# Patient Record
Sex: Female | Born: 1955 | Race: Black or African American | Hispanic: No | Smoking: Never smoker
Health system: Southern US, Community
[De-identification: ages and names within clinical notes are randomized; demographics above are authoritative.]

## PROBLEM LIST (undated history)

## (undated) DIAGNOSIS — Z9289 Personal history of other medical treatment: Secondary | ICD-10-CM

## (undated) DIAGNOSIS — H2010 Chronic iridocyclitis, unspecified eye: Secondary | ICD-10-CM

## (undated) DIAGNOSIS — Z8669 Personal history of other diseases of the nervous system and sense organs: Secondary | ICD-10-CM

## (undated) DIAGNOSIS — I2699 Other pulmonary embolism without acute cor pulmonale: Secondary | ICD-10-CM

## (undated) DIAGNOSIS — Z8489 Family history of other specified conditions: Secondary | ICD-10-CM

## (undated) DIAGNOSIS — M5416 Radiculopathy, lumbar region: Secondary | ICD-10-CM

## (undated) DIAGNOSIS — E079 Disorder of thyroid, unspecified: Secondary | ICD-10-CM

## (undated) DIAGNOSIS — M545 Low back pain, unspecified: Secondary | ICD-10-CM

## (undated) DIAGNOSIS — R7611 Nonspecific reaction to tuberculin skin test without active tuberculosis: Secondary | ICD-10-CM

## (undated) DIAGNOSIS — M1990 Unspecified osteoarthritis, unspecified site: Secondary | ICD-10-CM

## (undated) DIAGNOSIS — J309 Allergic rhinitis, unspecified: Secondary | ICD-10-CM

## (undated) DIAGNOSIS — M48061 Spinal stenosis, lumbar region without neurogenic claudication: Secondary | ICD-10-CM

## (undated) DIAGNOSIS — G8929 Other chronic pain: Secondary | ICD-10-CM

## (undated) DIAGNOSIS — T4145XA Adverse effect of unspecified anesthetic, initial encounter: Secondary | ICD-10-CM

## (undated) DIAGNOSIS — E785 Hyperlipidemia, unspecified: Secondary | ICD-10-CM

## (undated) DIAGNOSIS — Z8619 Personal history of other infectious and parasitic diseases: Secondary | ICD-10-CM

## (undated) DIAGNOSIS — I10 Essential (primary) hypertension: Secondary | ICD-10-CM

## (undated) DIAGNOSIS — T8859XA Other complications of anesthesia, initial encounter: Secondary | ICD-10-CM

## (undated) DIAGNOSIS — I82409 Acute embolism and thrombosis of unspecified deep veins of unspecified lower extremity: Secondary | ICD-10-CM

## (undated) DIAGNOSIS — K219 Gastro-esophageal reflux disease without esophagitis: Secondary | ICD-10-CM

## (undated) DIAGNOSIS — Z13220 Encounter for screening for lipoid disorders: Secondary | ICD-10-CM

## (undated) DIAGNOSIS — Z13820 Encounter for screening for osteoporosis: Secondary | ICD-10-CM

## (undated) DIAGNOSIS — M25572 Pain in left ankle and joints of left foot: Secondary | ICD-10-CM

## (undated) DIAGNOSIS — M79672 Pain in left foot: Secondary | ICD-10-CM

## (undated) DIAGNOSIS — R309 Painful micturition, unspecified: Secondary | ICD-10-CM

## (undated) DIAGNOSIS — Z23 Encounter for immunization: Secondary | ICD-10-CM

## (undated) DIAGNOSIS — Z139 Encounter for screening, unspecified: Secondary | ICD-10-CM

## (undated) DIAGNOSIS — M8580 Other specified disorders of bone density and structure, unspecified site: Secondary | ICD-10-CM

## (undated) DIAGNOSIS — J988 Other specified respiratory disorders: Secondary | ICD-10-CM

## (undated) DIAGNOSIS — Z0000 Encounter for general adult medical examination without abnormal findings: Secondary | ICD-10-CM

## (undated) HISTORY — DX: Spinal stenosis, lumbar region without neurogenic claudication: M48.061

## (undated) HISTORY — DX: Nonspecific reaction to tuberculin skin test without active tuberculosis: R76.11

## (undated) HISTORY — DX: Low back pain: M54.5

## (undated) HISTORY — DX: Personal history of other medical treatment: Z92.89

## (undated) HISTORY — DX: Chronic iridocyclitis, unspecified eye: H20.10

## (undated) HISTORY — PX: LUMBAR LAMINECTOMY: SHX95

## (undated) HISTORY — PX: ABDOMINAL HYSTERECTOMY: SHX81

## (undated) HISTORY — DX: Personal history of other diseases of the nervous system and sense organs: Z86.69

## (undated) HISTORY — DX: Low back pain, unspecified: M54.50

## (undated) HISTORY — PX: BREAST BIOPSY: SHX20

## (undated) HISTORY — DX: Disorder of thyroid, unspecified: E07.9

## (undated) HISTORY — DX: Personal history of other infectious and parasitic diseases: Z86.19

## (undated) HISTORY — DX: Allergic rhinitis, unspecified: J30.9

## (undated) HISTORY — DX: Hyperlipidemia, unspecified: E78.5

## (undated) HISTORY — DX: Radiculopathy, lumbar region: M54.16

## (undated) HISTORY — DX: Other chronic pain: G89.29

## (undated) LAB — PFT13-PULMONARY FUNCTION TEST
DLCO%: 87
DLCO: 17.5 ml/mmHg sec
FEV1%: 99
FEV1/FVC EXP: 74
FEV1/FVC: 67 %
FEV1: 2.48 L
FVC%: 112
FVC: 3.73 L
RESPONSE TO BRONCHODILATOR: -4
RV%: 137
RV: 2.57
TLC%: 123
TLC: 6.3 L
VC%: 112
VC: 3.73

## (undated) LAB — HM COLONOSCOPY: HM Colonoscopy: NORMAL

---
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
## 1982-07-05 HISTORY — PX: BACK SURGERY: SHX140

## 1986-07-05 HISTORY — PX: MYOMECTOMY: SHX85

## 2005-04-27 ENCOUNTER — Ambulatory Visit: Payer: Self-pay

## 2005-06-18 ENCOUNTER — Ambulatory Visit: Payer: Self-pay | Admitting: Unknown Physician Specialty

## 2007-04-25 ENCOUNTER — Ambulatory Visit: Payer: Self-pay | Admitting: Family Medicine

## 2007-05-10 ENCOUNTER — Ambulatory Visit: Payer: Self-pay | Admitting: Family Medicine

## 2007-05-18 ENCOUNTER — Ambulatory Visit: Payer: Self-pay | Admitting: Gastroenterology

## 2007-07-06 DIAGNOSIS — R7611 Nonspecific reaction to tuberculin skin test without active tuberculosis: Secondary | ICD-10-CM

## 2007-07-06 HISTORY — DX: Nonspecific reaction to tuberculin skin test without active tuberculosis: R76.11

## 2007-08-15 ENCOUNTER — Inpatient Hospital Stay: Payer: Self-pay | Admitting: Specialist

## 2007-09-18 ENCOUNTER — Ambulatory Visit: Payer: Self-pay | Admitting: Family Medicine

## 2007-11-10 ENCOUNTER — Ambulatory Visit: Payer: Self-pay | Admitting: General Surgery

## 2008-10-26 LAB — COMPREHENSIVE METABOLIC PANEL
ALT: 43 U/L — ABNORMAL HIGH (ref 10–40)
AST: 32 U/L (ref 15–37)
Albumin: 4.2 g/dL (ref 3.4–5.0)
Alkaline Phosphatase: 51 U/L (ref 25–100)
BUN: 12 mg/dL (ref 7–18)
CO2: 29 meq/L (ref 21–32)
Calcium: 9.7 mg/dL (ref 8.3–10.6)
Chloride: 104 meq/L (ref 99–110)
Creatinine: 0.7 mg/dL (ref 0.6–1.1)
GFR Est, African/Amer: >60
GFR, Estimated: >60 (ref >60)
Glucose: 81 mg/dL (ref 70–99)
Potassium: 4.7 meq/L (ref 3.5–5.1)
Sodium: 139 meq/L (ref 136–145)
Total Bilirubin: 0.7 mg/dL (ref 0.0–1.0)
Total Protein: 6.8 g/dL (ref 6.4–8.2)

## 2008-10-26 LAB — CBC WITH AUTO DIFFERENTIAL
Basophils %: 0.7 % (ref 0.0–2.0)
Basophils Absolute: 0 10*3 (ref 0.0–0.2)
Eosinophils %: 1.8 % (ref 0.0–5.0)
Eosinophils Absolute: 0.1 10*3 (ref 0.0–0.6)
Granulocyte Absolute Count: 4.9 10*3 (ref 1.7–7.7)
Hematocrit: 35.3 % — ABNORMAL LOW (ref 36.0–48.0)
Hemoglobin: 12.4 g/dL (ref 12.0–16.0)
Lymphocytes %: 18.8 % — ABNORMAL LOW (ref 25.0–40.0)
Lymphocytes Absolute: 1.3 10*3 (ref 1.0–5.1)
MCH: 31.5 pg (ref 26–34)
MCHC: 35.1 g/dL (ref 31–36)
MCV: 89.6 fl (ref 80–100)
MPV: 9.2 fl (ref 5.0–10.5)
Monocytes %: 8.2 % — ABNORMAL HIGH (ref 0.0–8.0)
Monocytes Absolute: 0.6 10*3 (ref 0.0–0.95)
Platelets: 212 10*3 (ref 135–450)
RBC: 3.95 10*6 — ABNORMAL LOW (ref 4.0–5.2)
RDW: 13 % (ref 11.5–14.5)
Segs Relative: 70.5 % — ABNORMAL HIGH (ref 42.0–63.0)
WBC: 6.9 10*3 (ref 4.0–11.0)

## 2008-10-26 LAB — LIPID PANEL
Cholesterol, Total: 232 mg/dl — ABNORMAL HIGH (ref <200)
HDL: 60 mg/dL (ref 40–60)
LDL Calculated: 137 mg/dl — ABNORMAL HIGH (ref <100)
Triglycerides: 173 mg/dl — ABNORMAL HIGH (ref <150)
VLDL Cholesterol Calculated: 35 mg/dl

## 2008-10-26 LAB — TSH: TSH: 0.96 u[IU]/mL (ref 0.35–5.5)

## 2008-11-01 NOTE — Op Note (Unsigned)
PATIENT NAME                  PA #             MR #                  Danielle Lawrence, Danielle Lawrence                    [id number]       [id number]            SURGEON                               SURG DATE   DIS DATE           Fernande Boyden, MD              11/01/08                        DATE OF BIRTH    AGE            PATIENT TYPE      RM #               12-21-1955       52             SDA                                     PREOPERATIVE DIAGNOSES:  1.  Deviated nasal septum.  2.  Chronic ethmoid sinusitis.  3.  Chronic maxillary sinusitis.     POSTOPERATIVE DIAGNOSES:  1.  Deviated nasal septum.  2.  Chronic ethmoid sinusitis.  3.  Chronic maxillary sinusitis.     PROCEDURE PERFORMED:  1.  Nasal septoplasty.  2.  Bilateral endoscopic total ethmoidectomy.  3.  Bilateral endoscopic maxillary antrostomy.     ANESTHESIA:  General endotracheal.     INFECTION CLASSIFICATION:  II.     INDICATIONS FOR PROCEDURE:   The patient is a 53 year old female with a six  to eight week history of sinusitis.  She had been on azithromycin, Augmentin  as well as Levaquin.  She continues to complain of right facial pain and  pressure.  She has nasal obstruction too.  It seems to effect both sides; the  left greater than the right.  She has evidence of occluded osteomeatal units  bilaterally.  CT scan revealed a mucosal thickening of the left side.  She  seems to improve with antibiotics but then two days after stopping them she  has recurrent pain and discomfort.  She has upper tooth pain.  She feels like  she has lost sleep.  We discussed continuing medical therapy versus surgical  therapy.  She is interested in surgical therapy.  We discussed the risks and  benefits of performing a nasal septoplasty and bilateral endoscopic sinus  surgery.  After a full disclosure of the risks and benefits, a consent was  obtained.     DESCRIPTION OF FINDINGS:  Leftward deviated nasal septum 75% on the left  side.  Osteomeatal units were tight and narrow secondary  to enlarged middle  turbinates.  There was no evidence of fluid or infection or polyps.       DESCRIPTION OF PROCEDURE:  After the induction of general endotracheal  anesthesia the nasal septum was infiltrated with 2 ml of 1% lidocaine with  1:100,000 epinephrine.  Cottonoid pledgets soaked in Afrin were placed in  both nasal cavities.  The patient was prepped and draped in the usual  fashion.       A time out was performed.  A left hemitransfixion incision was made.  A left  mucoperichondrial flap was elevated.  One and a half cm back from the caudal  edge an incision was made through the cartilaginous septum.  A right  mucoperichondrial flap was elevated.  A Silva knife was used to remove the  deviated portion of cartilage.  Posterior bony deflections were removed using  a combination of Jensen-Middleton and a 4 mm osteotome.  This significantly  opened the patient's nasal airway and improved access for sinus surgery.  The  left hemitransfixion incision was closed with interrupted 4-0 chromic gut  sutures.  The mucoperichondrial flaps were reapproximated to midline using a  4-0 plain gut on a Keith needle.       The 0 degree endoscope was then used to observe the left lateral nasal wall.   The root of the middle turbinate and lateral nasal wall were infiltrated with  1 ml of 1% lidocaine with 1:100,000 epinephrine.  A cottonoid pledget was  placed lateral to the middle turbinate.  After five minutes this was removed  and the uncinate process was taken down using a Freer and completely taken  down using a Blakeley.  The anterior ethmoid below was entered using a  microdebrider and opened up superiorly and laterally.  The basal lamella was  traversed and posterior ethmoid cells were opened from an inferior to  superior direction.  Maxillary sinus ostium was identified and opened  anteriorly using a back biter and a microdebrider.  The middle turbinate was  roughened using a microdebrided along its medial aspect as  was the  corresponding spot on the septum.  A Merocel sponge was then placed lateral  to the middle turbinate to serve for hemostasis and a spacer.       The right side was treated in a similar fashion.  A 0 degree endoscope was  introduced and used to direct local injection in the lateral nasal wall and  root of the middle turbinate.  A cottonoid pledget soaked in Afrin and 4%  lidocaine was placed lateral to the middle turbinate.  After five minutes  this was removed and the uncinate process was taken down using a Freer and  completely taken down using the microdebrider.  The maxillary sinus ostium  was found right away and opened anteriorly and inferiorly.  The anterior  ethmoid bulla was opened superior and laterally using the microdebrider.  The  basal lamella was traversed and posterior ethmoid cells were opened from an  inferior to superior direction and the middle turbinate was roughened in a  manner similar as above and a large Merocel sponge was placed lateral to the  middle turbinate again to serve for hemostasis and as a spacer.  The patient  tolerated the procedure well and was turned over to Anesthesia for  postoperative wakeup and recovery.     PATHOLOGY:  Right and left nasal contents.      ESTIMATED BLOOD LOSS:  Less than 50 ml.  Fernande Boyden, MD     [id number]  DD: 11/01/2008 09:54  DT: 11/01/2008 12:18  Job #: [id number]  CC:     [name], MD

## 2009-04-08 NOTE — Unmapped (Signed)
Signed by Harvie Junior MD on 04/08/2009 at 00:00:00  [facility]      Imported By: Betsey Amen 04/20/2009 12:52:06    _____________________________________________________________________    External Attachment:    Please see [employer] EMR for this document.

## 2009-04-12 NOTE — Unmapped (Signed)
Signed by Harvie Junior MD on 04/12/2009 at 00:00:00  ENT Medical History      Imported By: Betsey Amen 04/20/2009 12:10:00    _____________________________________________________________________    External Attachment:    Please see [facility] EMR for this document.

## 2009-04-12 NOTE — Unmapped (Signed)
Signed by Brenda Junior MD on 04/15/2009 at 14:36:24    [facility]        HISTORY OF PRESENT ILLNESS   Consult Request from: Brenda [name]  Chief Complaint: temporal bone fracture  04/12/2009    Brenda 53 yo [name] who fell 4 days ago sustaining contusions of left face and evaluated at [name].  Patient referred for possible facial fractures CAT scan of the head   CT scan Head scan result shows left facial hematoma with resolution  left facial fractures not specific   Possible left zygomatic arch fracture and poster lateral wall maxillary fx  No visual changes nor diplopia  may note subjective decreased left cheek sensation  patient did have loss of consciousness    otherwise  was in good health   no history smoking  allergies erythromycin    [name] report for Brenda [name] reviewed with the primary diagnosis injury to left orbit and facial contusion. they reported to no acute intercranial abnormalities            ALLERGIES:    ! ERYTHROMYCIN BASE (ERYTHROMYCIN BASE TBEC)    Allergy and adverse reaction list reviewed during this update.    MEDICATIONS:   2 ALVESCO 160 MCG/ACT AERS (CICLESONIDE)   TYLENOL WITH CODEINE #3 300-30 MG TABS (ACETAMINOPHEN-CODEINE) 1-2 tabs by mouth every 4-6 hr as needed pain    * Intake recorded by: Brenda [name]  April 12, 2009 11:35 AM  Smoking Status: non-smoker   Medications reviewed, updated and verified with patient or patient representative.    PHYSICAL EXAM:  GENERAL:Well-developed, well-nourished [name] in no acute distress.was able to converse well with the patient.is able to answer questions adequately and appropriately.\\par LYMPHATIC:No lymphadenopathy on neck palpation.  PSYCHIATRIC:NL mood and affect.x 4.  NECK:Overall appearance NL. No masses.symmetric with NL tracheal position. No crepitus.masses, tenderness, or enlargement of thyroid on palpation.glands NL size.  HEAD AND FACE:significant contusion and  bruising swelling of left lateral orbital site,  from swelling, no obvious bony stepoffs palpable  VWU:JWJXBJ motility NL.erythema or infection.  NOSE:Anterior rhinoscopy performed. Mucosa intact. No masses, polyps, or pus.septal perforation. Septum grossly midline.External nose without infection or abnormality. No septal hematoma.    OC/OP:Lips, teeth, and gums in good condition.No leukoplakia or masses.palates and tongue of NL symmetry.walls and tonsillar fossae without abnormalities.  CARDIOVASCULAR:Examination of the carotid arteries upon palpation reveals no increase in pulse amplitude.  SKIN:Visual examination and palpation of the scalp, neck, and head reveals no significant rashes, ulcerations, nodules, or lesions.  EARS:External ears have a normal appearance with no masses, lesions, or scars.\\par TMs bilaterally intact no hemotympanum, Webbe, Rhinne within normal limits  NEUROLOGIC:Facial nerve function grade I.\\parsignificant contusion and bruising swelling of left lateral orbital site        Medical Decision Making:   * review/order radiology tests  * obtain old records or history from another person  * review and summarization of old records       * CT's  Patient Education:   * verbal information and/or pamphlet/ literature given to patient.    Risks & Benefits:   * Risks, benefits and treatment options discussed with patient.    Assessment and Plan   Brenda 53 yo [name] who fell 4 days ago sustaining contusions of left face and evaluated at Brenda [name].  Patient referred for possible facial fractures CAT scan of the head   CT scan Head  scan result shows left facial hematoma with resolution  left facial fractures not specific   Possible left zygomatic arch fracture and poster lateral wall maxillary fx  No visual changes nor diplopia  may note subjective decreased left cheek sensation  patient did have loss of consciousness  otherwise  was in good health   [name] report for Brenda [name] reviewed with  the primary diagnosis injury to left orbit and facial contusion. they reported to no acute intercranial abnormalities  plans for forCT scan of face for better resolution of facial fractures  ophthalmology referral for left eye  patient request for pain medication, Tylenol No. 3, one to two tabs every four to six hours p.r.n. pain 30   rtc 1 wk  questions answered, instructions given      Plan        Absent hearing from you otherwise, I will move forward with the plan of care as described above.    Disposition:   * Return to clinic in 1 week(s)   Send letter(s)? Yes  cc:    Brenda Maroon, MD   [name]

## 2009-04-12 NOTE — Unmapped (Signed)
Signed by Harvie Junior MD on 04/12/2009 at 00:00:00  Disclosure to Family/Friends      Imported By: Betsey Amen 04/20/2009 10:39:06    _____________________________________________________________________    External Attachment:    Please see [facility] EMR for this document.

## 2009-04-12 NOTE — Unmapped (Signed)
Signed by Katrinka Blazing Deters on 04/17/2009 at 13:47:11                         [facility]         Otolaryngology         [address], [address]         [place], [place] [address]         p [phone] f [phone]         [email or web address]  April 12, 2009    Sherian Maroon, M.D.  [address]  [address]  [place], [place] [address]    RE:  Brenda [name]   DOB:  1955-09-21    Dear Dr. Marcial Pacas,    I recently had the pleasure of seeing your patient in consultation. Please review my assessment and plan.    ASSESSMENT AND PLAN    Pleasant 53 yo female who fell 4 days ago sustaining contusions of left face and evaluated at [facility].  Patient referred for possible facial fractures CAT scan of the head.   CT scan of the head result shows left facial hematoma with resolution,  left facial fractures not specific, possible left zygomatic arch fracture and poster lateral wall maxillary fx.  No visual changes nor diplopia.  May note subjective decreased left cheek sensation.  Patient did have loss of consciousness.  Otherwise  was in good health.   ER report for [name] reviewed with the primary diagnosis injury to left orbit and facial contusion. They reported no acute intracranial abnormalities.  Plans  for CT scan of face for better resolution of facial fractures.  Ophthalmology referral for left eye.  Patient request for pain medication, Tylenol No. 3, one to two tabs every four to six hours p.r.n. pain 30   rtc 1 wk.  Questions answered, instructions given.    Thank you for allowing me to participate in the care of this pleasant patient.  Please feel free to contact me should you have any concerns or questions.    Sincerely,        Elyn Peers, M.D., F.A.C.S.  Professor of Otolaryngology  Director, Facial Plastic & Reconstructive Surgery

## 2009-04-12 NOTE — Unmapped (Signed)
Signed by Harvie Junior MD on 04/12/2009 at 00:00:00  Photos      Imported By: Betsey Amen 04/20/2009 10:50:25    _____________________________________________________________________    External Attachment:    Please see [employer] EMR for this document.

## 2009-04-14 NOTE — Unmapped (Signed)
Signed by Harvie Junior MD on 04/14/2009 at 00:00:00  Office Visit      Imported By: Betsey Amen 04/28/2009 20:25:23    _____________________________________________________________________    External Attachment:    Please see [facility] EMR for this document.

## 2009-04-18 ENCOUNTER — Inpatient Hospital Stay

## 2009-04-18 NOTE — Unmapped (Signed)
Signed by   LinkLogic on 04/18/2009 at 13:46:44  Patient: Brenda [name]  Note: All result statuses are Final unless otherwise noted.    Tests: (1) CT-MAXILLOFACIAL W/O CONTRAST ([id number])    Order Note: [employer] Accession Number: [id number]    Order Note:                               [facility]     Patient: [name], [name]   DOB:     May 18, 1956   MRN:     [id number]   FIN:       [id number]   Accn#:   [id number]                                   Computed Tomography     Exam                       Exam Date/Time       Ordering Physician   CT-MAXILLOFACIAL W/O       04/18/2009 11:16 EDT Reeve Mallo B   CONTRAST     Reason for Exam   facial injury 910.8     Report   CT Maxillofacial without contrast dated 04/18/2009.     Indication: Facial injury.     Comparison: None.     Technique: Helically acquired axial images were acquired at 2.5 mm slice   thickness and reconstructed into 1.25  mm slice thickness in the axial and   coronal planes.     Findings:   There is mild mucosal thickening of the left maxillary sinus. Additionally   there is probable mucus retention cyst at the anterior/inferior aspect of   the left maxillary sinus. The remainder of the visualized paranasal sinuses   and mastoid cells are clear. There findings of bilateral antrectomies.   There is a fracture of the left zygomatic arch, as well as the anterior and   posterior walls of the left maxillary sinus.     The mandible, lamina papyracea, nasal bones, and nasal septum appear   intact.     There is focus of hyperdensity overlying the left zygomatic strut, likely   representing a hematoma in the setting of trauma. The visualized   intracranial structures are unremarkable on limited evaluation.     Impression:   1. Fractures involving the anterior and posterior walls of the left   maxillary sinus as well as the left zygomatic arch, compatible with tripod   type fracture.   2. Focal swelling overlying the left zygomatic strut likely represents a    hematoma in the setting of trauma.   ********** VERIFIED REPORT **********     Dictated: 04/18/2009 1:42 pm      PATEL M.D., Manus Rudd   Signed (Electronic Signature):  Kennis Carina MD, ROBERT R              04/18/09   1:47   Resident:  Allena Katz M.D., Manus Rudd     Technologist: JKW    Order Note:   EMR Routing to: [facility], Ryheem Jay B - ordering - [id number]    Note: An exclamation mark (!) indicates a result that was not dispersed into   the flowsheet.  Document Creation Date: 04/18/2009 1:46 PM  _______________________________________________________________________    (1) Order result status: Final  Collection or observation date-time: 04/18/2009 11:16:12  Requested date-time: 04/18/2009 06:58:00  Receipt date-time:   Reported date-time: 04/18/2009 13:47:17  Referring Physician: Elyn Peers  Ordering Physician: Jrake Rodriquez ([id number])  Specimen Source: RAD&Rad Type  Source: QRS  Filler Order Number: [id number]  Lab site: [facility]

## 2009-04-18 NOTE — Unmapped (Signed)
Signed by Harvie Junior MD on 04/18/2009 at 00:00:00  ENT Registration Update      Imported By: Coletta Memos 04/24/2009 19:29:18    _____________________________________________________________________    External Attachment:    Please see [facility] EMR for this document.

## 2009-04-18 NOTE — Unmapped (Signed)
Signed by Harvie Junior MD on 04/21/2009 at 18:15:38  Patient: Brenda [name]  Note: All result statuses are Final unless otherwise noted.    Tests: (1) Aspirate, Left Eyebrow (ASP)  ! Path-Nature of Specimen                              NOTES      .  SPECIMEN(S): A Aspirate, Left Eyebrow  ! Path-Gross Pathology      NOTES      .  GROSS DESCRIPTION:  Received 30ml dark red cytolyt. Prepared 1 thin prep  and 1 cell block.  ! Path-Clinical History                              NOTES      .  CLINICAL HISTORY:  924.9  ! Path-Diagnosis            NOTES      .  DIAGNOSIS:  Left Eyebrow Aspirate (thin prep and cell block):  -No malignant cells present.  -Few lymphocytes , debris, and macrophages present.  Material Examined: 1 thin prep and 3 H  Performing Location: [address], [place], [place] [address]  [phone], [phone]  ! SIGNATURE                 NOTES      .  Diagnostician:  Eunice Blase  Cytotechnologist  Diagnostician:  Darrol Jump M.D.  Pathologist  Electronically Signed 04/21/2009    Note: An exclamation mark (!) indicates a result that was not dispersed into   the flowsheet.  Document Creation Date: 04/21/2009 4:24 PM  _______________________________________________________________________    (1) Order result status: Final  Collection or observation date-time: 04/18/2009 00:00  Requested date-time: 04/18/2009 00:00  Receipt date-time: 04/18/2009 00:00  Reported date-time:   Referring Physician:    Ordering Physician:  Renee Erb, (homdb)  Specimen Source:   Source: Leverne Humbles Order Number: [id number])  Lab site:

## 2009-04-18 NOTE — Unmapped (Signed)
Signed by Brenda [name] on 04/23/2009 at 11:24:10                         [facility]         Otolaryngology         [address]         [place], [place] [address]         p [phone] f [phone]         [email or web address]      April 18, 2009    Brenda [name], M.D.  [address]  [address]  [place], [place] [address]    RE:  Brenda [name]  DOB:  08/01/1955    Dear Brenda [name],    Just wanted to update you on Brenda [name], who was in to see me today.     ASSESSMENT AND PLAN    53 y/o female with left ZMC fracture, non-displaced. Some pain with biting. Offered plate ZM buttress versus conservative management with soft diet and observation. Discussed risks, benefits, and alternatives and patient elects conservative treatment at this time.  FNA done 2 cc old blood obtained.  -RTC one week.    Thank you for allowing me to participate in the care of this pleasant patient.  Please feel free to contact me should you have any concerns or questions.    Sincerely,        Brenda [name], M.D., F.A.C.S.  Professor of Otolaryngology  Director, Facial Plastic & Reconstructive Surgery

## 2009-04-18 NOTE — Unmapped (Signed)
Signed by Harvie Junior MD on 04/18/2009 at 00:00:00  Operative Consent      Imported By: Betsey Amen 04/20/2009 10:20:20    _____________________________________________________________________    External Attachment:    Please see [employer] EMR for this document.

## 2009-04-18 NOTE — Unmapped (Signed)
Signed by Harvie Junior MD on 04/21/2009 at 18:56:16      [facility]          HISTORY OF PRESENT ILLNESS   Consult Request from: [facility]    Medical history questionnaire was reviewed by me with the patient. Review of systems is negative except as noted on scanned document dated 04/18/2009  Medical history was reviewed by me.     Comments: here for f/u facial fracture s/p fall 10 days ago. had CT scan this AM. Still subtle epain with biting down. no trismus, cosmetic deformity. Continued left lateral orbital rim swelling and ecchymosis. Has seen ophtho and is cleared from their end.  VITAL SIGNS:     Weight: 128 lbs.,  Height: 65 inches,   Respirations: 16    ALLERGIES:    ! ERYTHROMYCIN BASE (ERYTHROMYCIN BASE TBEC)    Allergy and adverse reaction list reviewed during this update.    MEDICATIONS:   2 ALVESCO 160 MCG/ACT AERS (CICLESONIDE)   TYLENOL WITH CODEINE #3 300-30 MG TABS (ACETAMINOPHEN-CODEINE) 1-2 tabs by mouth every 4-6 hr as needed pain    * Intake recorded by: Rowan Blase MA  April 18, 2009 11:31 AM  Smoking Status: non-smoker   Medications reviewed, updated and verified with patient or patient representative.    PHYSICAL EXAM:  GENERAL:Well-developed, well-nourished White Female in no acute distress.was able to converse well with the patient.is able to answer questions adequately and appropriately.\\par LYMPHATIC:No lymphadenopathy on neck palpation.  PSYCHIATRIC:NL mood and affect.x 4.  NECK:Overall appearance NL. No masses.symmetric with NL tracheal position. No crepitus.masses, tenderness, or enlargement of thyroid on palpation.glands NL size.  HEAD AND FACE:No sinus tenderness. Hematoma left lateral orbitl rim, aspirated 2 ml old hematoma. No bony mobility. occlusion intact.  ZOX:WRUEAV motility NL.erythema or infection.  NOSE:Anterior rhinoscopy performed. Mucosa intact. No masses, polyps, or pus.septal perforation. Septum grossly  midline.External nose without infection or abnormality.  OC/OP:Lips, teeth, and gums in good condition.No leukoplakia or masses.palates and tongue of NL symmetry.walls and tonsillar fossae without abnormalities.  CARDIOVASCULAR:Examination of the carotid arteries upon palpation reveals no increase in pulse amplitude.  SKIN:Visual examination and palpation of the scalp, neck, and head reveals no significant rashes, ulcerations, nodules, or lesions.  EARS:External ears have a normal appearance with no masses, lesions, or scars.\\parNEUROLOGIC:Facial nerve function grade I.cranial nerves grossly WNL.    PROCEDURE NOTE: Fine needle aspiration of left orbital rim hematoma. Consent obtained after discussing the risks, benefits, hazards, and alternatives. .3 ml lidocain 1% with 1:100,000 epinephrine was used for local anesthetic. An 18 g needle was then used to aspirate 2 ml old hematoma. No bleeding or obvious complications. Tolerated the procedure well.        Medical Decision Making:   * review/order clinical lab tests  * review/order radiology tests  * CT/ MRI/ X-ray films/images reviewed       * CT's done on 04/18/2009 - Details: Reviewed. Ct max/face from today. Left zygomaticomaxillary complex fracture, non-displaced.    Patient Education:   * verbal information and/or pamphlet/ literature given to patient.    Risks & Benefits:   * Risks, benefits and treatment options discussed with patient.    Assessment and Plan   53 y/o female with left ZMC fracture, non-displaced. Some pain with biting. Offered plate ZM buttress versus conservative management with soft diet and observation. Discussed risks, benefits, and alternatives and patient elects conservative treatment at this time  FNA done 2 cc old blood obtained  -RTC one week.  questions answered, instructions given      Plan          PRECEPTOR ACKNOWLEDGEMENTS    I saw and examined the patient.  I discussed with resident and agree with resident's findings and plan as  documented in resident's note.    Comments: Seen with Dr. Maeola Sarah.    Disposition:   * Return to clinic in 1 week(s)   Send letter(s)? Yes  cc:    Sherian Maroon, MD   [facility]      Process Orders  Check Orders Results:      EMR Link Lab: ABN not required for this insurance  Not all tests in this order have been sent for requisitioning  Pending tests not yet sent for requisitioning:      04/18/2009: EMR Link Lab -- Cytology, Fine Needle Aspiration, Spec Mgt [[id number]] (signed)

## 2009-04-29 NOTE — Unmapped (Signed)
Signed by Angelique Holm MA on 04/29/2009 at 15:18:11    PHONE NOTE  Caller's Cell Phone #: [phone]  Caller: patient  Department: Otolaryngology    Reason for Call: PT. STATES THAT THE HEMANGIOMA THAT DR. Eliot Ford DRAINED IS FILLING BACK UP AGAIN.  SHE WOULD LIKE TO KNOW IF SHE NEEDS TO COME BACK IN TO HAVE IT REDRAINED OR CAN HE CALL HER IN AN ANTIBIOTIC FOR THIS.   PLEASE ADVISE.  PLEASE LEAVE A DETAILED MESSAGE ON HER PHONE IF SHE DOES NOT ANSWER.  THANKS.      Initial call taken by: Derenda Mis,  April 29, 2009 8:53 AM      FOLLOW UP  PT IS COMING IN ON FRIDAY AT 7:30AM.  Follow-up by:  Angelique Holm MA,  April 29, 2009 3:18 PM

## 2009-05-02 NOTE — Unmapped (Signed)
Signed by Harvie Junior MD on 05/02/2009 at 00:00:00  ENT Registration Update      Imported By: Coletta Memos 05/02/2009 23:37:57    _____________________________________________________________________    External Attachment:    Please see [employer] EMR for this document.

## 2009-05-02 NOTE — Unmapped (Signed)
Signed by Harvie Junior MD on 05/04/2009 at 21:03:45    [facility]        HISTORY OF PRESENT ILLNESS   Consult Request from: [facility]  Chief Complaint: scant swelling lat orbital region    Medical history questionnaire was reviewed by me with the patient. Review of systems is negative except as noted on scanned document dated 05/02/2009    VITAL SIGNS:     Weight: 120 lbs.,  Height: 65 inches,   Respirations: 11    ALLERGIES:    ! ERYTHROMYCIN BASE (ERYTHROMYCIN BASE TBEC)    Allergy and adverse reaction list reviewed during this update.    MEDICATIONS:   2 ALVESCO 160 MCG/ACT AERS (CICLESONIDE)     * Intake recorded by: Sheral Flow MA  May 02, 2009 7:44 AM  Smoking Status: non-smoker   Medications reviewed, updated and verified with patient or patient representative.    here for f/u facial fracture s/p fall several wks  ago.  Still subtle epain with biting down. no trismus, cosmetic deformity. Recent small left lateral orbital rim swelling  Has seen ophtho and is cleared from their end.  No fevers      PHYSICAL EXAM:  GENERAL:Well-developed, well-nourished White Female in no acute distress.was able to converse well with the patient.is able to answer questions adequately and appropriately.\\par LYMPHATIC:No lymphadenopathy on neck palpation.  PSYCHIATRIC:NL mood and affect.x 4.  NECK:Overall appearance NL. No masses.symmetric with NL tracheal position. No crepitus.masses, tenderness, or enlargement of thyroid on palpation.glands NL size.  HEAD AND FACE:No sinus tenderness. No Hematoma left lateral orbital rim, slight swelling 3 mm No bony mobility. occlusion intact.  VHQ:IONGEX motility NL.erythema or infection.  vision intact, no diploplia, EOMI  levator function intact  good eyelid closure  no eye irritation    NOSE:Anterior rhinoscopy performed. Mucosa intact. No masses, polyps, or pus.septal perforation. Septum grossly midline.External nose without infection  or abnormality.  OC/OP:Lips, teeth, and gums in good condition.No leukoplakia or masses.palates and tongue of NL symmetry.walls and tonsillar fossae without abnormalities.  CARDIOVASCULAR:Examination of the carotid arteries upon palpation reveals no increase in pulse amplitude.  SKIN:Visual examination and palpation of the scalp, neck, and head reveals no significant rashes, ulcerations, nodules, or lesions.  EARS:External ears have a normal appearance with no masses, lesions, or scars.\\parNEUROLOGIC:Facial nerve function grade I.cranial nerves grossly WNL.    PROCEDURE NOTE: Fine needle aspiration of left orbital rim region Consent obtained after discussing the risks, benefits, hazards, and alternatives. .3 ml lidocain 1% with 1:100,000 epinephrine was used for local anesthetic. An 22 g needle was then used, No fluid obtained. No bleeding or obvious complications. Tolerated the procedure well.        Medical Decision Making:   * review/order clinical lab tests  * review/order radiology tests  * CT/ MRI/ X-ray films/images reviewed       * CT's done on 04/18/2009 - Details: Reviewed. Ct max/face from today. Left zygomaticomaxillary complex fracture, non-displaced.    Patient Education:   * verbal information and/or pamphlet/ literature given to patient.    Risks & Benefits:   * Risks, benefits and treatment options discussed with patient.              Assessment and Plan   Pleasant 53 y/o female with left ZMC fracture, non-displaced. Some pain with biting. Pt elected conservative management with soft diet and observation. conservative treatment at this time  FNA done 2  cc old blood obtained 2 wks ago and last several days, pt felt some fluid accumulated.  Repeat FNA done, no fluid obtained, no infection  -RTC 1-2 wks  questions answered, instructions given  Plan          Disposition:   * Return to clinic in 1-2 week(s)    Sherian Maroon, MD   [facility]             Assessment and Plan   Pleasant 53 y/o female  with left ZMC fracture, non-displaced. Some pain with biting. Pt elected conservative management with soft diet and observation. conservative treatment at this time  FNA done 2 cc old blood obtained 2 wks ago and last several days, pt felt some fluid accumulated.  Repeat FNA done, no fluid obtained, no infection  -RTC 1-2 wks  questions answered, instructions given             Sherian Maroon, MD   [facility]

## 2009-07-31 LAB — CBC WITH AUTO DIFFERENTIAL
Basophils %: 0.7 % (ref 0.0–2.0)
Basophils Absolute: 0.1 10*3 (ref 0.0–0.2)
Eosinophils %: 0.3 % (ref 0.0–5.0)
Eosinophils Absolute: 0 10*3 (ref 0.0–0.6)
Granulocyte Absolute Count: 11.4 10*3 — ABNORMAL HIGH (ref 1.7–7.7)
Hematocrit: 38.9 % (ref 36.0–48.0)
Hemoglobin: 13.1 g/dL (ref 12.0–16.0)
Lymphocytes %: 13.9 % — ABNORMAL LOW (ref 25.0–40.0)
Lymphocytes Absolute: 2 10*3 (ref 1.0–5.1)
MCH: 30.6 pg (ref 26–34)
MCHC: 33.6 g/dL (ref 31–36)
MCV: 91 fl (ref 80–100)
MPV: 9 fl (ref 5.0–10.5)
Monocytes %: 5.1 %
Monocytes Absolute: 0.7 10*3
Platelets: 325 10*3 (ref 135–450)
RBC: 4.28 10*6 (ref 4.0–5.2)
RDW: 13.7 % (ref 11.5–14.5)
Segs Relative: 80 % — ABNORMAL HIGH (ref 42.0–63.0)
WBC: 14.3 10*3 — ABNORMAL HIGH (ref 4.0–11.0)

## 2009-08-01 LAB — IGG, IGA, IGM
IgA, Serum: 137 mg/dL (ref 70–400)
IgG, Serum: 837 mg/dL (ref 700–1600)
IgM,Serum: 168 mg/dL (ref 40–230)

## 2009-08-02 LAB — DIPHTHERIA / TETANUS ANTIBODY PANEL
Diphtheria IgG Ab: 0.7 IU/ml
Tetanus IgG Ab: 12.26 IU/ml

## 2009-08-02 LAB — IGE: Immunoglobulin E: 7 [IU]/mL (ref 0–180)

## 2009-08-04 MED ORDER — IOVERSOL 74 % IV SOLN
74 % | Freq: Once | INTRAVENOUS | Status: AC | PRN
Start: 2009-08-04 — End: 2009-08-04
  Administered 2009-08-05: 120 mL via INTRAVENOUS

## 2009-08-26 ENCOUNTER — Ambulatory Visit: Payer: Self-pay | Admitting: Family Medicine

## 2009-09-02 ENCOUNTER — Inpatient Hospital Stay
Admit: 2009-09-02 | Discharge: 2009-09-03 | Disposition: A | Discharge status: Home / Self Care | Attending: Emergency Medicine

## 2009-09-02 NOTE — ED Notes (Signed)
Patient to xray per stretcher.

## 2009-09-02 NOTE — ED Provider Notes (Signed)
HPI  History of Present Illness     Patient Identification  Danielle Lawrence is a 54 y.o. female.    Chief Complaint   Shortness of Breath      History of Present Illness:    This is a  54 y.o. female who complains of acute shortness of breath yesterday and today, worsened with talking. PMHx significant for asthma, worse in the spring with allergies, though this has been a problem during the winter; the worsening symptoms yesterday correspond with the drop in temperature. The patient coughs but never hears her self wheeze. The patient has used steroids this year for her asthma with some relief, but symptoms return when pt tapers off dose. Followed by PCP [name], MD and Dr. Mena Goes, pulmonary. SHx: the patient is going to [place] tomorrow to see her son play in baseball spring training and expects the warmer, drier air to help her symptoms.      Past Medical History   Diagnosis Date   . Asthma        Past Surgical History   Procedure Date   . Sinus surgery        No current facility-administered medications on file.     Current outpatient prescriptions   Medication Sig Dispense Refill   . predniSONE (DELTASONE) 20 MG tablet Take 20 mg by mouth daily.       . Mometasone Furo-Formoterol Fum (DULERA) 200-5 MCG/ACT AERO Inhale  into the lungs.       Marland Kitchen loratadine (CLARITIN) 10 MG tablet Take 10 mg by mouth daily.       Marland Kitchen levalbuterol (XOPENEX) 1.25 MG/3ML nebulizer solution Take 1 ampule by nebulization every 4 hours as needed.       Marland Kitchen dexlansoprazole (DEXILANT) 60 MG CPDR capsule Take 60 mg by mouth daily.       Marland Kitchen ibuprofen (ADVIL;MOTRIN) 600 MG tablet Take 600 mg by mouth every 6 hours as needed.           Allergies   Allergen Reactions   . Erythromycin        History   Social History   . Marital Status: Married     Spouse Name: N/A     Number of Children: N/A   . Years of Education: N/A   Occupational History   . Not on file.   Social History Main Topics   . Smoking status: Never Smoker    . Smokeless tobacco: Not  on file   . Alcohol Use: No   . Drug Use:    . Sexually Active:    Other Topics Concern   . Not on file   Social History Narrative   . No narrative on file       Nursing Notes Reviewed      Review of Systems   Constitutional: Negative for fever.   HENT: Negative for ear pain, sore throat and rhinorrhea.    Eyes: Negative for visual disturbance.   Respiratory: Positive for shortness of breath.    Cardiovascular: Negative for chest pain.   Gastrointestinal: Negative for abdominal pain, diarrhea, constipation and blood in stool.   Genitourinary: Negative for dysuria and frequency.   Musculoskeletal:        No unusual back pain   Skin: Negative for rash.   Neurological:        No unusual headache   Psychiatric/Behavioral: Negative for confusion.       Physical Exam  GENERAL APPEARANCE: Danielle Lawrence is  in no acute respiratory distress. Awake and alert.  VITAL SIGNS: ED Triage Vitals   Enc Vitals Group      BP 09/02/09 1712 150/65 mmHg      Pulse 09/02/09 1712 84       Resp 09/02/09 1712 22       Temp 09/02/09 1712 99.2 F (37.3 C)      Temp src --       SpO2 09/02/09 1712 99 %      Weight 09/02/09 1712 125 lb (56.7 kg)      Height --       Head Cir --       Peak Flow --       Pain Score --       Pain Loc --       Pain Edu? --       Excl. in GC? --      HEENT: Normocephalic, atraumatic. Extraocular muscles are intact. Pupils equal round and reactive to light. Conjunctivas are pink. Negative scleral icterus. Mucous membranes are moist. Tongue in the midline. Pharynx without erythema or exudates. No uvular deviation.  NECK: Nontender and supple. No stridor or meningismus.  CHEST: Nontender to palpation. Clear to auscultation bilaterally. No rales, rhonchi, or wheezing.  HEART: S1, S2. Regular rate and rhythm. Strong and equal pulses in the upper and lower extremities. No cyanosis or peripheral edema.  ABDOMEN: Soft, nontender, nondistended, positive bowel sounds, no palpable pulsatile masses.  MUSCULOSKELETAL: The calves are  nontender to palpation. Active range of motion of the upper and lower extremities.  NEUROLOGICAL: Awake, alert and oriented x 3. Power intact in the upper and lower extremities. Sensation is intact to light touch in the upper and lower extremities. Cranial Nerves 2-12 are intact. No truncal ataxia.  IMMUNOLOGICAL: No palpable lymphadenopathy or lymphatic streaking.  DERMATOLOGIC: No petechiae, rashes, or ecchymoses.    Procedures  N/a    MDM      I estimate there is LOW risk for PULMONARY EMBOLISM, ACUTE CORONARY SYNDROME, OR THORACIC AORTIC DISSECTION, thus I consider the discharge disposition reasonable. Glade Lloyd and I have discussed the diagnosis and risks, and we agree with discharging home to follow-up with their primary doctor. We also discussed returning to the Emergency Department immediately if new or worsening symptoms occur. We have discussed the symptoms which are most concerning (e.g., bloody sputum, fever, worsening pain or shortness of breath, vomiting) that necessitate immediate return.     Clinical Impression    1. Asthma with exacerbation (493.92C)          Blood pressure 150/65, pulse 84, temperature 99.2 F (37.3 C), resp. rate 22, weight 125 lb (56.7 kg), last menstrual period 08/23/2009, SpO2 99.00%.      Radiology  X-RAYS:  I have reviewed radiologic plain film image(s).  ALL OTHER NON-PLAIN FILM IMAGES SUCH AS CT, ULTRASOUND AND MRI HAVE BEEN READ BY THE RADIOLOGIST.    XR CHEST PA AND LATERAL    NAD

## 2009-09-02 NOTE — ED Notes (Signed)
Returned from [employer]

## 2009-09-02 NOTE — ED Notes (Signed)
Patient alert and oriented x 3, warm, dry and pink at discharge.  New prescriptions provided.

## 2009-09-02 NOTE — Discharge Instructions (Signed)
Asthma   WHAT YOU SHOULD KNOW:    Asthma is a long-term condition where your bronchial tubes become swollen and narrowed. The bronchial tubes are airways that carry air in and out of your lungs. An asthma attack (episode of asthma) usually happens after you are exposed to triggers. Triggers are things you breathe in or things you do that give you an asthma attack. These include dust, smoke, chemicals, heavy exercise, and changes in weather. During an asthma attack your airways swell and make too much mucus. The small muscles in your airways also tighten. When the airways become too narrow, less air flows to your lungs and you have trouble breathing.             Caregivers will ask you about medical problems that you have had, and do an exam to learn if you have asthma. Your caregiver will ask you what causes you to have an asthma attack, and what your symptoms are. You may be tested for allergies. You may have blood and peak flow tests, pulmonary function tests, and x-rays. You and your caregiver will work together to decrease your symptoms and prevent asthma attacks. Your caregiver will give you asthma medicines and teach you when and how to use them. He may change your medicines if your symptoms get worse or better. There is no cure for asthma, but the condition can be controlled, and asthma attacks can be prevented.   INSTRUCTIONS:   Medicines:    Keep a written list of the medicines you take, the amounts, and when and why you take them. Bring the list of your medicines or the pill bottles when you see your caregivers. Learn why you take each medicine. Ask your caregiver for information about your medicine. Do not use any medicines, over-the-counter drugs, vitamins, herbs, or food supplements without first talking to caregivers.     Always take your medicine as directed by caregivers. Call your caregiver if you think your medicines are not helping or if you feel you are having side effects. Do not quit  taking your medicines until you discuss it with your caregiver. If you are taking medicine that makes you drowsy, do not drive or use heavy equipment.    Steroids: These medicines decrease swelling in your airways to help you breathe easier. Steroids may come as inhalers or pills. You may need to use this medicine every day. Ask for more information about this medicine.     Ask your caregiver when to return for a follow-up visit. Keep all appointments. Write down any questions you may have. This way you will remember to ask these questions during your next visit.   You may need to see your caregiver seven days after going home from the hospital. Your caregiver will check your health to see how well your treatment is working. He may change your medicines to better treat you. You may need to use an MDI, nebulizer, or dry powder inhaler. Ask your caregiver how to use your inhaler correctly. You may need to have a pulmonary function test (PFT) done at least once a year. This helps caregivers to see how well your lungs are working.    Well-controlled asthma: When your asthma is well controlled, you will feel that you are breathing easier. Wheezing and tightness in your chest will be milder. Your symptoms will appear twice a week or less often. You will not wake up during the night or early in the morning because you are coughing  or having trouble breathing. You will not need to use bronchodilator medicines more than twice a week. You will not be bothered by asthma symptoms when exercising, working, or doing things you enjoy. You will have an increase in your PEF, which means that you are breathing more easily.   Warning signs of an asthma attack: You may have more asthma attacks during the spring or summer because there is more dust and pollen in the air. Being in closed or crowded places may make it more likely that you will have an asthma attack. Most people with asthma have warning signs before they have more  serious symptoms. Warning signs are not the same for everyone with asthma. Your own warning signs may be different from time to time. By learning what your warning signs are, you can take your medicines, or get help right away. Doing this may help prevent serious asthma attacks. The following are some warning signs of an asthma attack:    Breathing faster than usual.     Coughing.     Fast heartbeat.     Feeling more tired than usual.     Itchy or sore throat.     Shortness of breath while exercising or doing an activity.      Trouble sleeping, and feeling tired the next day.   Using your written asthma action plan:    You and your caregiver will make a written action plan for your asthma. An asthma action plan is a set of instructions on what to do when you have an asthma attack. It lists your asthma medicines and their brand, dosage, side effects, and how to use them. It tells which medicines and how much to take depending on how bad your symptoms are. It teaches you how to tell if your medicines are relieving your symptoms. It has instructions on how and when to seek help when your symptoms get really bad.      Follow your asthma action plan very carefully. The instructions written there are what you and your caregiver worked out. These instructions were made to work very well with your type of asthma. Write down new asthma attacks, and what you felt. Write down what you were doing, or the things around you that could have cause your asthma attack. Sometimes you may need to write down your PEF every day to help your caregiver watch your asthma control.   Preventing asthma attacks: Things to avoid:    Avoid triggers such as dust, smoke, chemicals, and very hard exercise. Do not eat foods that you know you are allergic to. Avoid foods that contain sulfites such as wine or processed foods. Stop smoking, and stay away from people who do. Keep windows closed during the seasons when pollen and molds are at  the highest, such as spring.     Keep pets, such as cats, out of your home. If you have cockroaches or other pests in your home, get rid of them quickly.   What to do:    Follow your asthma action plan. Remember the symptoms that you have before your asthma attacks. This helps you watch out for the next attack.      Make sure air flows freely in all the rooms in your house. Use air conditioning to control the temperature and humidity in your house.      Remove old carpets, fabric covered furniture, drapes, and furry toys in your house. Use special covers for your mattresses and pillows.  These covers do not let dust mites pass through or live inside the pillow or mattress. Wash your beddings once a week in hot water.   CONTACT A CAREGIVER IF:    You are coughing more than the usual, wheezing, and have trouble breathing.     Your medicines do not relieve your symptoms like they used to.     Your symptoms give you problems when doing your usual activities.     You have questions or concerns about your condition or medicines.   SEEK CARE IMMEDIATELY IF:    You feel that very little air is reaching your lungs and you cannot breathe.     You have trouble thinking or you lose consciousness.     You have very bad pain in your chest.     Your lips or fingernails turn gray or blue.   Copyright  2009. [employer]. All rights reserved. Information is for End User's use only and may not be sold, redistributed or otherwise used for commercial purposes.  The above information is an educational aid only. It is not intended as medical advice for individual conditions or treatments. Talk to your doctor, nurse or pharmacist before following any medical regimen to see if it is safe and effective for you.

## 2009-09-02 NOTE — ED Notes (Signed)
DR. Cyril Mourning AT BEDSIDE.

## 2009-10-19 LAB — IGE: Immunoglobulin E: 5 IU/ml (ref 0–180)

## 2009-12-05 NOTE — Unmapped (Signed)
Signed by Harvie Junior MD on 12/13/2009 at 23:32:46    [facility]        HISTORY OF PRESENT ILLNESS   Consult Request from: [facility]    VITAL SIGNS:     Weight: 120 lbs.,  Height: 65 inches,   Respirations: 12    ALLERGIES:    ! ERYTHROMYCIN BASE (ERYTHROMYCIN BASE TBEC)    Allergy and adverse reaction list reviewed during this update.    MEDICATIONS:   2 ALVESCO 160 MCG/ACT AERS (CICLESONIDE)     * Intake recorded by: Rowan Blase RMA  December 05, 2009 10:47 AM    Medications reviewed, updated and verified with patient or patient representative.    Screening for unhealthy alcohol use performed.  Women (Any Age) or Men (over Age 47): less than 3 drinks per occasion    no discharge, no fever, no pain   no hematoma  no infection        Assessment and Plan   Pleasant 54 y/o female with left ZMC fracture, non-displaced. No pain with biting. Pt elected conservative management with soft diet and observation. conservative treatment was done  doing well  questions answered, instructions given  RTC 2-3 mo  Plan          Disposition:   * Return to clinic in 2-3 month(s)    Sherian Maroon, MD   [facility]      Process Orders  Tests Sent for requisitioning (December 13, 2009 11:30 PM):      04/18/2009: EMR Link Lab -- Cytology, Fine Needle Aspiration, Spec Mgt [[id number]] (signed)

## 2009-12-05 NOTE — Unmapped (Signed)
Signed by Harvie Junior MD on 12/05/2009 at 00:00:00  ENT      Imported By: Betsey Amen 12/08/2009 22:13:15    _____________________________________________________________________    External Attachment:    Please see [employer] EMR for this document.

## 2010-02-02 ENCOUNTER — Ambulatory Visit: Payer: Self-pay | Admitting: Family Medicine

## 2010-06-01 ENCOUNTER — Inpatient Hospital Stay
Admit: 2010-06-01 | Discharge: 2010-06-02 | Disposition: A | Discharge status: Home / Self Care | Attending: Emergency Medicine

## 2010-06-01 LAB — POC URINALYSIS
Bilirubin, UA: NEGATIVE
Blood, UA POC: NEGATIVE
Glucose, UA POC: NEGATIVE
Ketones, UA: NEGATIVE
Leukocytes, UA: NEGATIVE
Nitrite, UA: NEGATIVE
Protein, UA POC: NEGATIVE
Spec Grav, UA: <=1.005
Urobilinogen, UA: 0.2
pH, UA: 6

## 2010-06-01 LAB — POC PREGNANCY UR-QUAL: Preg Test, Ur: NEGATIVE

## 2010-06-01 NOTE — ED Notes (Signed)
C/o feeling hyper since march alert and oriented x3 skin wdp breathing full and easy moves all 4 extremities upon command and against resisitance    Ralph Dowdy, RN  06/01/10 1655

## 2010-06-01 NOTE — Discharge Instructions (Signed)
Stress, What is it?     Stress related medical problems are becoming increasingly common.     The body has a built-in physical response to stressful situations. Faced with pressure, challenge or danger, we need to react quickly. Our bodies release hormones such as cortisol and adrenaline to help do this. These hormones are part of the "fight or flight" response and affect the metabolic rate, heart rate and blood pressure, resulting in a heightened, stressed state that prepares the body for optimum performance in dealing with a stressful situation.     It is likely that early man required these mechanisms to stay alive, but usually modern stresses do not call for this, and the same hormones released in today's world can damage health and reduce coping ability.     SOME OF THE PROBLEMS WHICH CAUSE STRESS ARE:   Pressure to perform at work, at school or in sports.   Threats of physical violence.   Money worries.   Arguments.   Family conflicts.   Divorce or separation from significant other.   Bereavement.   New job or unemployment.   Changes in location.   Alcohol or drug abuse.       SOMETIMES, THERE IS NO PARTICULAR REASON FOR DEVELOPING STRESS.  Almost all people are at risk of being stressed at some time in their lives. It is important to know some stress is temporary and some is long term.   Temporary stress will go away when a situation is resolved. Most people can cope with short periods of stress, and it can often be relieved by relaxing, taking a walk, chatting through issues with friends, or having a good night's sleep.    Chronic (long-term, continuous) stress is much harder to deal with. It can be psychologically and emotionally damaging. It can be harmful both for an individual and for friends and family.     THE SYMPTOMS OF STRESS  Everyone reacts to stress differently. There are some common effects that help [place] recognize it. In times of extreme stress, people may:   Shake uncontrollably.    Breathe faster and deeper than normal (hyperventilate).  Vomit.    For people with asthma, stress can trigger an attack.   For others, stress can trigger migraine headaches, ulcers, and body pain.      PHYSICAL EFFECTS OF STRESS INCLUDE.   Loss of energy.    Skin problems.    Aches and pains resulting from tense muscles, including neck ache, backache and tension headaches.    Increased pain from arthritis and other conditions.    Irregular heart beat (palpitations).   Periods of irritability or anger.    Apathy or depression.   Anxiety (feeling uptight or worrying).   Behavior that is not usual.    Loss of appetite.    Comfort eating.    Lack of concentration.    Loss of, or decreased sex-drive.    Increased smoking, drinking or recreational drug taking.   For women, missed periods.   Ulcers, joint pain, and muscle pain.         Post-traumatic stress (This is the stress caused by any serious accident, strong emotional damage, extremely difficult or violent experience such as rape or war).     Post-traumatic stress victims can experience mixtures of emotions such as fear, shame, depression, guilt or anger. It may include recurrent memories or images that may be haunting. These feelings can last for weeks, months or even years after the  traumatic event that triggered them. Specialized treatment, possibly with medicines and psychological therapies, is available.     If stress is causing physical symptoms, severe distress or making it difficult for you to function as normal, it is worth seeing your caregiver. It is important to remember that although stress is a usual part of life, extreme or prolonged stress can lead to other illnesses that will need treatment. It is better to visit a doctor sooner rather than later. Stress has been linked to the development of high blood pressure and heart disease, as well as insomnia and depression.     There is no diagnostic test for stress since everyone reacts to  it differently. But a caregiver will be able to spot the physical symptoms, such as:   Headaches.   Shingles.   Ulcers.     Emotional distress such as intense worry, low mood or irritability should be detected when the doctor asks pertinent questions to identify any underlying problems that might be the cause. In case there are physical reasons for the symptoms, the doctor may also want to do some tests to exclude certain conditions.     If you feel that you are suffering from stress, try to identify the aspects of your life that are causing it. Sometimes you may not be able to change or avoid them, but even a small change can have a positive ripple effect. A simple lifestyle change can make all the difference.     STRATEGIES THAT CAN HELP DEAL WITH STRESS:   Delegating or sharing responsibilities.   Avoiding confrontations.    Learning to be more assertive.    Regular exercise.    Avoid using alcohol or street drugs to cope.    Eating a healthy, balanced diet, rich in fruit and vegetables and proteins.    Finding humor or absurdity in stressful situations.   Never taking on more than you know you can handle comfortably.    Organizing your time better to get as much done as possible.    Talking to friends or family and sharing your thoughts and fears.    Listening to music or relaxation tapes.    Tensing and then relaxing your muscles, starting at the toes and working up to the head and neck.       If you think that you would benefit from help, either in identifying the things that are causing your stress or in learning techniques to help you relax, see a caregiver who is capable of helping you with this. Rather than relying on medications, it is usually better to try and identify the things in your life that are causing stress and try to deal with them.     There are many techniques of managing stress including counseling, psychotherapy, aromatherapy, yoga, and exercise. Your caregiver can help you  determine what is best for you.     Document Released: 09/11/2002  Document Re-Released: 03/30/2008  [facility] Patient Information 2011 [place].    No driving, no drinking with ativan (lorazepam).

## 2010-06-01 NOTE — ED Provider Notes (Signed)
HPI    Review of Systems    Physical Exam    Procedures    MDM  Chief Complaint:  Mind racing, feeling "hyped up" and muscle twitching  History of Present Illness: 54 y.o. female, here with her husband, presents for above. Her sx have been ongoing for months and have gradually worsened. Today, she felt so bad she needed to come here. She tells me that she has spoken to her PCP about her sx and is planning to see a neurologist. No fever, chest pain, trouble breathing, focal weakness, abdominal pain, suicidal ideation, homicidal ideation. Her husband tells me that there is a lot of stress with the holidays and there are family members visiting at the house.  In confidence, her husband tells me that pt's mother has Huntington's chorea and she (Ms. Rosenfield) is very concerned that she has it. Her family has been trying to convince her to have genetic testing.       Medications    Current Outpatient Prescriptions   Medication Sig Dispense Refill   ??? Ciclesonide (ALVESCO) 160 MCG/ACT AERS Inhale  into the lungs.         ??? loratadine (CLARITIN) 10 MG tablet Take 10 mg by mouth daily.       ??? ibuprofen (ADVIL;MOTRIN) 600 MG tablet Take 600 mg by mouth every 6 hours as needed.           Allergies:  Allergies   Allergen Reactions   ??? Erythromycin    ??? Sulfa Drugs        Past Medical History:  Past Medical History   Diagnosis Date   ??? Asthma        Surgical History:  Past Surgical History   Procedure Date   ??? Sinus surgery       Social History:  History     Social History   ??? Marital Status: Married     Spouse Name: N/A     Number of Children: N/A   ??? Years of Education: N/A     Occupational History   ??? Not on file.     Social History Main Topics   ??? Smoking status: Never Smoker    ??? Smokeless tobacco: Not on file   ??? Alcohol Use: No       Review of Systems:  10 systems reviewed. Please see HPI for pertinent positives, otherwise negative.    Physical Exam:  Vital Signs: 98.8 (37.1) 86 16 136/83 100%  Constitutional: Patient is  oriented to person, place, and time. Well-developed and well-nourished. No acute distress.   HENT:   Head: Normocephalic and atraumatic.   Eyes: Conjunctivae are normal.  Right eye exhibits no discharge. Left eye exhibits no discharge.   Right Ear: External ear normal.   Left Ear: External ear normal.   Nose: Nose normal.  Mouth: Moist mucous membranes. Uvula midline  Neck: Supple. No meningismus. No tracheal deviation present. No JVD.  Cardiovascular: S1, S2, no murmurs, no rubs, no gallops. Pulmonary/Chest: Lungs clear to ausculation bilaterally. No wheezes, rhonchi, nor crackles. No retractions. Speaking full sentences without difficulty.   Abdominal: Soft. Bowel sounds are normal. No distension. No tenderness. No rebound and no guarding. No hepatosplenomegaly.   Musculoskeletal: No extremity edema. Compartments soft.    Neurological: Alert and oriented to person, place, and time. CN 2-12 grossly intact. 5/5 strength in all 4 extremities. Tactile sensation intact all 4 extremities. Normal gait. Patellar reflexes 2+ bilaterally.  Skin: Warm and  dry. No rash noted.   Psychiatric: Calm, cooperative. No hallucinations, no delusions, no suicidal ideation, no homicidal ideation.     Labs:  Results for orders placed during the hospital encounter of 06/01/10   POC PREGNANCY UR-QUAL       Component Value Range    Preg Test, Ur neg     POC URINALYSIS       Component Value Range    Color, UA        Clarity, UA        Glucose, UA POC neg      Bilirubin, UA neg      Ketones, UA neg      Spec Grav, UA <=1.005      Blood, UA POC neg      pH, UA 6.0      Protein, UA POC neg      Urobilinogen, UA 0.2      Leukocytes, UA neg      Nitrite, UA neg      Appearance               Basic metabolic panel (Final result)   Component (Lab Inquiry)         Result Time  NA  K  CL  CO2  GLUCOSE     06/01/10 2014  141  4.4  104  30  87               Result Time  BUN  Creatinine, Ser  CALCIUM  GFR Est  GFR Est, African/Amer     06/01/10 2014  12   0.7  9.6  >60  >60  >60 mL/min/1.82m2 EGFR, calc. for ages 68 & older using the MDRD formula (not corrected for weight),is valid for stable renal function.             Phosphorus (Final result)   Component (Lab Inquiry)         Result Time  PHOS     06/01/10 2014  3.6             Magnesium (Final result)   Component (Lab Inquiry)         Result Time  MG     06/01/10 2014  2.2            Pt's family history with sx she describes over the past several months are consistent with Huntington's chorea but this will need to be followed and evaluated further. There are stressors and anxiety that are contributing to her presentation in the ED.    I discussed results, plan, and precautions and answered Daysy Holben's questions. History and physical not consistent with suicidal ideation, stroke, electrolyte imbalance nor other pathology requiring further ED workup and treatment. We both feel comfortable with her discharge home.    Clinical Impression    1. Stress reaction    2. Anxiety state        Discharged home in good condition.          Pryor Montes, MD  06/14/10 [phone]

## 2010-06-02 LAB — BASIC METABOLIC PANEL
BUN: 12 mg/dl (ref 7–18)
CO2: 30 mEq/L (ref 21–32)
Calcium: 9.6 mg/dl (ref 8.3–10.6)
Chloride: 104 mEq/L (ref 99–110)
Creatinine: 0.7 mg/dl (ref 0.6–1.1)
GFR Est, African/Amer: >60
GFR, Estimated: >60 (ref >60)
Glucose: 87 mg/dl (ref 70–99)
Potassium: 4.4 mEq/L (ref 3.5–5.1)
Sodium: 141 mEq/L (ref 136–145)

## 2010-06-02 LAB — MAGNESIUM: Magnesium: 2.2 mg/dl (ref 1.8–2.4)

## 2010-06-02 LAB — PHOSPHORUS: Phosphorus: 3.6 mg/dl (ref 2.5–4.9)

## 2010-06-02 MED ORDER — LORAZEPAM 1 MG PO TABS
1 MG | Freq: Once | ORAL | Status: AC
Start: 2010-06-02 — End: 2010-06-01
  Administered 2010-06-02: 01:00:00 via ORAL

## 2010-06-02 MED ORDER — LORAZEPAM 1 MG PO TABS
1 MG | ORAL_TABLET | Freq: Two times a day (BID) | ORAL | Status: DC | PRN
Start: 2010-06-02 — End: 2011-04-14

## 2010-06-02 MED FILL — LORAZEPAM 1 MG PO TABS: 1 MG | ORAL | Qty: 1

## 2010-06-03 LAB — COMPREHENSIVE METABOLIC PANEL
ALT: 27 U/L (ref 10–40)
AST: 33 U/L (ref 15–37)
Albumin: 4.7 gm/dl (ref 3.4–5.0)
Alkaline Phosphatase: 58 U/L (ref 45–129)
BUN: 13 mg/dl (ref 7–18)
CO2: 30 mEq/L (ref 21–32)
Calcium: 9.7 mg/dl (ref 8.3–10.6)
Chloride: 104 mEq/L (ref 99–110)
Creatinine: 0.7 mg/dl (ref 0.6–1.1)
GFR Est, African/Amer: >60
GFR, Estimated: >60 (ref >60)
Glucose: 72 mg/dl (ref 70–99)
Potassium: 3.7 mEq/L (ref 3.5–5.1)
Sodium: 140 mEq/L (ref 136–145)
Total Bilirubin: 0.6 mg/dl (ref 0.0–1.0)
Total Protein: 7.6 gm/dl (ref 6.4–8.2)

## 2010-06-03 LAB — LIPID PANEL
Cholesterol, Total: 201 mg/dl — ABNORMAL HIGH (ref <200)
HDL: 72 mg/dl — ABNORMAL HIGH (ref 40–60)
LDL Calculated: 114 mg/dl — ABNORMAL HIGH (ref <100)
Triglycerides: 73 mg/dl (ref <150)
VLDL Cholesterol Calculated: 15 mg/dl

## 2010-08-10 NOTE — Unmapped (Signed)
Signed by Liz Malady MD on 08/10/2010 at 00:00:00  Transferred Records       Imported By: Coletta Memos 10/07/2010 19:32:11    _____________________________________________________________________    External Attachment:    Please see [employer] EMR for this document.

## 2010-08-10 NOTE — Unmapped (Signed)
Signed by Liz Malady MD on 08/10/2010 at 00:00:00  Neurology Media Consent      Imported By: Coletta Memos 09/08/2010 10:45:14    _____________________________________________________________________    External Attachment:    Please see [employer] EMR for this document.

## 2010-08-10 NOTE — Unmapped (Signed)
Signed by Rexford Maus on 08/10/2010 at 14:44:39    Neurology Preload      Preload Clinical Lists   Problems:   OSTEOPENIA (ICD-733.90)  GERD (ICD-530.81)  ASTHMA (ICD-493.90)  HEMATOMA (ICD-924.9)  INJURY, FACE (ICD-910.8)    Medications:   ALVESCO 160 MCG/ACT AERS (CICLESONIDE)   SERTRALINE HCL 100 MG TABS (SERTRALINE HCL) one daily      Allergies:  ! ERYTHROMYCIN BASE (ERYTHROMYCIN BASE TBEC)    Coordinating Care Providers   PCP Name: Sherian Maroon, MD    PAST HISTORY  Family History: family hx of huntingtons  Mother - huntingtons  Father - alzheimers  Sister - anxiety  Social History: Marital Status: married,   Tobacco Usage:non-smoker

## 2010-08-10 NOTE — Unmapped (Signed)
Signed by Rexford Maus on 08/10/2010 at 14:44:39    Printed Handout:  - VIDEO/PHOTOGRAPH CONSENT FORM

## 2010-08-11 ENCOUNTER — Inpatient Hospital Stay: Attending: Neurology

## 2010-08-11 NOTE — Unmapped (Signed)
Signed by Liz Malady MD on 08/28/2010 at 16:24:48  Patient: Brenda [name]  Note: All result statuses are Final unless otherwise noted.    Tests: (1) Huntington Disease Mutation [phone])    Order Note: Performed at:  [facility]    Order Note: [address] [place], [place]  [id number]    Order Note: Lab Director: Cory Roughen MD, Phone:  [phone]  ! Huntington Allele 1       46 CAG Repeats  ! Huntington Allele 2       30 CAG Repeats  ! Huntington Interpretation                              See Note            This individual has one allele in the affected range (46      repeats) and one allele in the mutable range (30 repeats).      She is predicted to develop Huntington Disease (HD)      sometime within a normal lifespan. Her offspring have a      50% risk for inheriting an affected HD allele. Genetic      consultation is recommended.            This result has been reviewed and approved by Joellyn Rued, M.D.      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            Phenotype                           Number of CAG Repeats      -------------                       ---------------------      Normal Allele                       less than or equal to 26      Mutable normal Allele                      27-35      HD Allele with Reduced Penetrance          36-39      HD Allele                           equal or greater than 40            CHARACTERISTICS: Neurodegenerative disorder causing      progressive cognitive, motor, and psychiatric disturbances      typically beginning at 33-52 years of age (although 5%      affected as juveniles and 25% affected after age 81).      INCIDENCE: 1 in 15,000      INHERITANCE: Autosomal dominant      CAUSE: Expanded number of CAG repeats in the HD gene (27-35      repeats-unaffected, intermediate; 36-39 repeats-reduced      penetrance; 40+ repeats-affected, full penetrance).      CLINICAL SENSITIVITY AND SPECIFICITY: 99%   METHODOLOGY: PCR followed by size analysis using capillary      electrophoresis, all apparent homozygotes reflexed to      Southern blot  analysis.      ANALYTICAL SENSITIVITY AND SPECIFICITY: 99%      LIMITATIONS: Other neurodegenerative disorders will not be      detected. Sizing by [employer] blot is not precise varying up      to +/- 15 repeats.            The performance characteristics of this test were validated      by [employer]  The U.S. Food and Drug      Administration (FDA) has not approved or cleared this test.      However, FDA approval or clearance is currently not      required for clinical use of this test.  The results are      not intended to be used as the sole means for clinical      diagnosis or patient management decisions.  [facility] is      authorized under Clinical Laboratory Improvement Amendments      (CLIA) and by all states to perform high-complexity testing.            Counseling and informed consent are recommended for genetic      testing. Consent forms are available online at      [email or web address].  Patient Note: Huntingtons dna consent fo r testing signed by patien t Huntingt    Note: An exclamation mark (!) indicates a result that was not dispersed into   the flowsheet.  Document Creation Date: 08/27/2010 8:21 PM  _______________________________________________________________________    (1) Order result status: Final  Collection or observation date-time: 08/11/2010 13:02  Requested date-time: 08/11/2010 13:02  Receipt date-time: 08/11/2010 13:02  Reported date-time:   Referring Physician:    Ordering Physician:  Tomma Rakers ([email or web address])  Specimen Source:   Source: Leverne Humbles Order Number: [id number]  Lab site:

## 2010-08-11 NOTE — Unmapped (Signed)
Signed by Yves Dill MA on 08/24/2010 at 10:18:36                        [facility]         Neurology         [address]         [place], [place] [address]         p [phone]         [email or web address]      August 24, 2010      RE: Brenda [name]  DOB:   Dec 04, 1955      Dear Dr. Mammie Russian;     I had the pleasure of seeing your patient, Brenda [name], in consultation in the [facility] on 08/11/2010.     History of Present Illness:  She first noticed racy, speedy spells in December 2010. These spells consist of her mind racing, she starts speaking, her muscles get tight. Initially she thought these spells were due to taking steroids (for asthma). Shortly afterwards she noticed generalized involuntary movements. She describes them as twitches. She started dropping objects, since mid 2011. She denies difficulty walking, but she admits that her balance can be off at times. In November 2011 she was rocking in a chair and she fell backwards with the whole chair. In 2010 she was trying to step over a dog gate and she fell and hit her face; she suffered a left orbital fracture. She has noticed that she is bumping into things while walking. She still walks and plays golf. She denies hallucinations, delusions, lightheadedness, fainting spells, daytime sedation, nausea or vomiting. She denies memory problems. She admits occasional feelings of depression, especially in the winter. She started sertraline in December 2011, up to 150 mg qd; last increased 10 days ago. It helps some, but she is not sure about the details. She sleeps well, she believes the sertraline helps with her sleep. She denies urinary incontinence or constipation. She has anxiety, that occurs in spells. She has not had a recent MRI of the brain. Dr. Jeanelle Malling requests a consultation for evaluation and treatment of her  condition.    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 On exam she has mild chorea and bradykinesia, mild abnormality in tandem walking, motor impersistence (enough signs for a clinical diagnosis of HD). She will continue taking sertraline for now. A trial of clonazepam would be reasonable at this time. Genetic testing for HD (confirmatory) is warranted at this time. Genetic counseling is recommended. We will schedule a visit at [facility], to discuss the results of testing. Regular stretching exercises were recommended.  I extensively discussed these issues with the patient, and answered all her questions.    Plan:  1. Start clonazepam.   2. Send blood for genetic HD test.           Thank you for allowing me to participate in the care of this pleasant patient.  If you would like a copy of my office note, please contact our Medical Records department at [phone].  Please feel free to contact me should you have any concerns or questions.      Sincerely,      Alric Ran, MD

## 2010-08-11 NOTE — Unmapped (Signed)
Signed by Liz Malady MD on 08/11/2010 at 00:00:00  Lab Req for DNA Testing-Huntington Disease      Imported By: Coletta Memos 09/08/2010 10:46:25    _____________________________________________________________________    External Attachment:    Please see [employer] EMR for this document.

## 2010-08-11 NOTE — Unmapped (Signed)
Signed by Liz Malady MD on 08/11/2010 at 00:00:00  Neurology      Imported By: Coletta Memos 10/08/2010 22:22:11    _____________________________________________________________________    External Attachment:    Please see [employer] EMR for this document.

## 2010-08-11 NOTE — Unmapped (Signed)
Signed by Liz Malady MD on 08/21/2010 at 18:51:46    [facility]  ___________________________________________________________________________  Office Visit    History of Present Illness   Chief Complaint:   This is a 55 y/o right handed white woman with a chief complaint of involuntary movements and racy, speedy thinking.    Patient accompanied by and additional history obtained from: herself.    She first noticed racy, speedy spells in December 2010. These spells consist of her mind racing, she starts speaking, her muscles get tight. Initially she thought these spells were due to taking steroids (for asthma). Shortly afterwards she noticed generalized involuntary movements. She describes them as twitches. She started dropping objects, since mid 2011. She denies difficulty walking, but she admits that her balance can be off at times. In November 2011 she was rocking in a chair and she fell backwards with the whole chair. In 2010 she was trying to step over a dog gate and she fell and hit her face; she suffered a left orbital fracture. She has noticed that she is bumping into things while walking. She still walks and plays golf. She denies hallucinations, delusions, lightheadedness, fainting spells, daytime sedation, nausea or vomiting. She denies memory problems. She admits occasional feelings of depression, especially in the winter. She started sertraline in December 2011, up to 150 mg qd; last increased 10 days ago. It helps some, but she is not sure about the details. She sleeps well, she believes the sertraline helps with her sleep. She denies urinary incontinence or constipation. She has anxiety, that occurs in spells. She has not had a recent MRI of the brain. Dr. Jeanelle Malling requests a consultation for evaluation and treatment of her condition.      Other Symptoms:     Falls:   present  Difficulty with sleep:   present  Cognitive problems:    present  Depression:   present      PAST HISTORY  Past Medical History:  asthma, allergies.  Surgical History:  s/p sinus surgery; s/p tonsilectomy.    Family History: Her father is 26, he has Alzheimer's disease. Her mother had Huntington's disease, she died at age 2, from a pulmonary embolism; HD symptoms started around age 62. Her maternal grandmother died in her 30s. She has a sister, age 9, tested negative for the HD gene; she has a diagnosis of depression, she attempted suicide in around 2005. She has one son, born in 32; one daughter born in 53; both are asymptomatic (the daughter has a baby boy). Other members of the family with HD include: maternal uncle, who died at age 20.   Mother - huntingtons  Father - alzheimers  Sister - anxiety  Social History: Marital Status: married,   Children: 2,   Employment Status: employed part-time,   Occupation: Lawyer  Patient Lives at: home,   Caffeine per Day: 0  Alcohol Use: occasionally  Drug Use: none  Tobacco Usage:non-smoker    Caffeine Intake:     Caffeine drinks/day:   0        Vital Signs   Height: 65 in.  Weight: 124.2 lbs. Pulse rate: 84   Blood Pressure   BP #1: 130 / 62mm Hg  Cuff Size: Std     Allergies  ! ERYTHROMYCIN BASE (ERYTHROMYCIN BASE TBEC)    Medications on Intake:    ALVESCO 160 MCG/ACT AERS (CICLESONIDE)   SERTRALINE HCL 100 MG TABS (SERTRALINE HCL) 1.5 tab  daily.    Medications reviewed, updated and verified with patient or patient representative.  Previous Screening:   Screening for unhealthy alcohol use performed. (12/05/2009 8:29:01 AM)      Physical Examination    General   Head and Neck: no lymphadenopathy, carotids 2+/no bruits, nl thyroid gland  Pulmonary: clear to auscultation  CVS: RRR, nl S1 S2, no S3 S4, no M  Extremities: no edema, cyanosis, clubbing, pulses 2+    Mental Status   Appearance: normal  Memory: grossly intact  Fund of Knowledge: grossly intact  Speech Characteristics  Normal.  Language: normal  Affect:    euthymic  Mood:   euthymic  Thought Content:     normal  Thought Process:   normal    Cranial Nerves   Pupils:   equal, round, reactive to light  Visual Fields:   full to confrontation  Extraocular Movements:     full  Smooth Pursuit:     normal  Saccade Velocity:     normal  Facial Sensation:   bilateral facial sensation normal  Facial Strength: normal facial symmetry and strength       Facial Expression:    normal  Hearing:   proper hearing to finger rub bilaterally  Palate:   soft palate elevates in the midline spontaneously  Sternocleidomastoid/Trapezius:   full shoulder shrug equally  Tongue:   tongue protrudes midline    Motor Exam     Muscle Tone (0 to 4 scale with 0 being normal):     Muscle tone is normal in all muscle groups of the upper and lower extremities.    Other Motor Examinations (rated on a 0-4 scale, with 0 being absent):     Ballismus is absent.    Apraxia:     Comment:   see UHDRS  Tics are absent  Myoclonus is absent.  Dystonia is absent.    Chorea - At Rest:   Comment:   see UHDRS      Strength:     Muscle strength is 5/5 in all major muscle groups.    Reflexes   Muscle stretch reflexes are normal except as noted below.    Sensory Examination   Normal to light touch, temperature, vibration and proprioception.    Coordination     Tremor:      Resting tremor is absent.   Intention tremor is absent.   Postural tremor is absent.   Action tremor is absent.      Finger-to-Nose:     Right:   no ataxia  Left:   no ataxia    Heel-Knee-Shin:     Right:   no ataxia  Left:   no ataxia    Romberg:    normal    Gait       Gait Comments:   see UHDRS      Unified Huntington's Disease Rating Scale     Motor Assessment        Ocular pursuit - horizontal: 1 - Jerky movement       Ocular pursuit - vertical: 1 - Jerky movement       Saccade initiation - horizontal: 2 - Suppressible blinks or head movements to initiate       Saccade initiation - vertical: 2 - Suppressible blinks or head movements to initiate        Saccade velocity - horizontal: 1 - Mild slowing       Saccade velocity - vertical: 1 - Mild slowing  Dysarthria: 1 - Unclear, no need to repeat       Tongue protrusion: 2 - Cannot keep fully protruded for 5 seconds       Finger taps - right: 1 - Mild slowing and/or reduction in amplitude (11-14/5 seconds)       Finger taps - left: 1 - Mild slowing and/or reduction in amplitude (11-14/5 seconds)       Pronate/supinate hand, right: 1 - Mild slowing and/or irregular       Pronate/supinate hand, left: 1 - Mild slowing and/or irregular       Luria (fist-hand-palm test): 1 - < 4 in 10 seconds, no cue       Rigidity - arms, right: 0 - Absent       Rigidity - arms, right: 0 - Absent       Body bradykinesia: 1 - Minimally slow (? normal)       Maximal dystonia - trunk: 1 - Slight/intermittent       Maximal dystonia - RUE: 0 - Absent       Maximal dystonia - LUE: 0 - Absent       Maximal dystonia - RLE: 0 - Absent       Maximal dystonia - LLE: 0 - Absent       Maximal chorea - face: 1 - Slight/intermittent       Maximal chorea - BOL: 0 - Absent       Maximal chorea - trunk: 2 - Mild/common or moderate/intermittent       Maximal chorea - RUE: 2 - Mild/common or moderate/intermittent       Maximal chorea - LUE: 2 - Mild/common or moderate/intermittent       Maximal chorea - RLE: 2 - Mild/common or moderate/intermittent       Maximal chorea - LLE: 2 - Mild/common or moderate/intermittent       Gait: 1 - Wide base and/or slow       Tandem walking: 1 - 1 to 3 deviations from straight line       Retropulsion pull test: 2 - Would fall if not caught      Motor Assessment Total:   33    Motor Assessment Evaluated by: Liz Malady MD  August 11, 2010 12:25 PM      Assessment and Plan    Problems:    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 On exam she has mild chorea and  bradykinesia, mild abnormality in tandem walking, motor impersistence (enough signs for a clinical diagnosis of HD). She will continue taking sertraline for now. A trial of clonazepam would be reasonable at this time. Genetic testing for HD (confirmatory) is warranted a this time. Genetic counseling is recommended. We will schedule a visit at [facility], to discuss the results of testing. Regular stretching exercises were recommended.  I extensively discussed these issues with the patient, and answered all her questions.        Plan   1. Start clonazepam. 2. Send blood for genetic HD test.    Medications:   New Prescriptions/Refills:  CLONAZEPAM 0.5 MG TABS (CLONAZEPAM) Start 0.5 tab at bedtime; then increase to 1 tab at bedtime after 2 weeks.  #90 x 1, 08/11/2010, Christina Deidesheimer      Disposition:     Return to clinic in  2 months      Prescriptions:  CLONAZEPAM 0.5 MG TABS (CLONAZEPAM) Start 0.5 tab at bedtime; then increase to 1 tab at bedtime after 2 weeks.  #90 x 1   Entered by: Rexford Maus   Authorized by: Liz Malady MD   Signed by: Rexford Maus on 08/11/2010   Method used: Print then Give to Patient   RxID: [id number]          Process Orders  Check Orders Results:      [facility]: ABN not required for this insurance  Tests Sent for requisitioning (August 21, 2010 6:47 PM):      08/11/2010: [facility] -- Misc Lab test  [employer])  [phone] (signed)

## 2010-08-24 NOTE — Unmapped (Signed)
Signed by Rexford Maus on 08/24/2010 at 11:44:16    PHONE NOTE  Call back at Home Phone: [phone]  Caller's Cell Phone #: [phone]  Caller: patient  Department: Neurology  Call for: Elba Dendinger    Reason for Call: pt wants huntington disease test results & pt can't get in to see Dr Asencion Islam until March 20th and wants to get in there sooner      Initial call taken by: Juliette Alcide Rippy,  August 24, 2010 10:24 AM      FOLLOW UP  spk with patient and she wanted to see when results would be back...i called the lab and they said midweek so i called pt back and let her know that info and told her we would see her either friday or tuesday..      Follow-up by:  Rexford Maus,  August 24, 2010 11:44 AM

## 2010-08-31 NOTE — Unmapped (Signed)
Signed by Rexford Maus on 09/01/2010 at 08:07:32    PHONE NOTE  Call back at Home Phone: [phone]  Caller: patient  Department: Neurology  Call for: Arieal Cuoco    Reason for Call: instructed to call and schedule appt for this wk, Tues specifically  - pt would like spouse to make appt so asking for a call from MA       Initial call taken by: Young Berry,  August 31, 2010 8:25 AM      FOLLOW UP  pt called again & she wants test results, call her at [phone]  Follow-up by:  Juliette Alcide Rippy,  August 31, 2010 9:24 AM    FOLLOW UP  patient informed Dr Heywood Footman not in office tomorrow and his asst will call back regarding appt.   Follow-up by:  Vladimir Creeks MA,  August 31, 2010 11:39 AM    FOLLOW UP  called and got results and pt is coming in on friday 130    Follow-up by:  Rexford Maus,  September 01, 2010 8:07 AM

## 2010-09-01 NOTE — Unmapped (Signed)
Signed by Rexford Maus on 09/01/2010 at 12:01:36    PHONE NOTE  Call back at Home Phone: [phone]  Caller's Cell Phone #: [phone]  Caller: patient  Department: Neurology  Call for: Hanif Radin    Reason for Call: Pt is returning phone call from yesterday for the results of her testing for huntington disease. call back at home first [phone]) then cell [phone])      Initial call taken by: Catalina Lunger,  September 01, 2010 10:50 AM      FOLLOW UP  already made appt for friday at 130 mar 3 to come in and get results  Follow-up by:  Rexford Maus,  September 01, 2010 12:01 PM

## 2010-09-04 ENCOUNTER — Inpatient Hospital Stay: Attending: Neurology

## 2010-09-04 NOTE — Unmapped (Signed)
Signed by Yves Dill MA on 09/09/2010 at 11:08:47                        [facility]         Neurology         [address], [address]         [place], [place] [address]         p [phone] f [phone]         [email or web address]      September 09, 2010      RE: Brenda [name]  DOB:   October 01, 1955      Dear Dr. Sherian Maroon;     I had the pleasure of seeing your patient, Brenda [name], in consultation in the [facility] on 09/04/2010.     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 Her initial history follows: she first noticed racy, speedy spells in December 2010. These spells consist of her mind racing, she starts speaking, her muscles get tight. Initially she thought these spells were due to taking steroids (for asthma). Shortly afterwards she noticed generalized involuntary movements. She describes them as twitches. She started dropping objects, since mid 2011. She denies difficulty walking, but she admits that her balance can be off at times. In November 2011 she was rocking in a chair and she fell backwards with the whole chair. In 2010 she was trying to step over a dog gate and she fell and hit her face; she suffered a left orbital fracture. She has noticed that she is bumping into things while walking. She still walks and plays golf. She denies hallucinations, delusions, lightheadedness, fainting spells, daytime sedation, nausea or vomiting. She denies memory problems. She admits occasional feelings of depression, especially in the winter. She started sertraline in December 2011, up to 150 mg qd; last increased 10 days ago. It helps some, but she is not sure about the details. She sleeps well, she believes the sertraline helps with her sleep. She denies urinary incontinence or constipation. She has anxiety, that  occurs in spells.    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 On exam she has mild chorea and bradykinesia, mild abnormality in tandem walking, motor impersistence (enough signs for a clinical diagnosis of HD). 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 She will continue taking sertraline for now. The dose of clonazepam should be lowered at this time (side effects). Treatment with a low dose of tetrabenazine is reasonable at this time. Regular stretching exercises were recommended.  I extensively discussed these issues with the patient and her husband, and answered all their questions.     Plan:  1. Decrease the dose of clonazepam.   2. Start tetrabenazine at a low dose.         Thank you for allowing me to participate in the care of this pleasant patient.  If you would like a copy of my office note, please contact our Medical Records department at [phone].  Please feel free to contact me should you have any concerns or questions.  Sincerely,      Alric Ran, MD

## 2010-09-04 NOTE — Unmapped (Signed)
Signed by Liz Malady MD on 09/08/2010 at 17:33:24    [facility]  ___________________________________________________________________________  Office Visit    History of Present Illness   Patient accompanied by and additional history obtained from: her husband.    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 Her initial history follows: she first noticed racy, speedy spells in December 2010. These spells consist of her mind racing, she starts speaking, her muscles get tight. Initially she thought these spells were due to taking steroids (for asthma). Shortly afterwards she noticed generalized involuntary movements. She describes them as twitches. She started dropping objects, since mid 2011. She denies difficulty walking, but she admits that her balance can be off at times. In November 2011 she was rocking in a chair and she fell backwards with the whole chair. In 2010 she was trying to step over a dog gate and she fell and hit her face; she suffered a left orbital fracture. She has noticed that she is bumping into things while walking. She still walks and plays golf. She denies hallucinations, delusions, lightheadedness, fainting spells, daytime sedation, nausea or vomiting. She denies memory problems. She admits occasional feelings of depression, especially in the winter. She started sertraline in December 2011, up to 150 mg qd; last increased 10 days ago. It helps some, but she is not sure about the details. She sleeps well, she believes the sertraline helps with her sleep. She denies urinary incontinence or constipation. She has anxiety, that occurs in spells.      Other Symptoms:     Falls:   denies  Difficulty with sleep:   present  Cognitive problems:   present  Depression:   present      PAST HISTORY  Past Medical History (reviewed - no changes required):  asthma,  allergies.  Surgical History (reviewed - no changes required):  s/p sinus surgery; s/p tonsilectomy.    Family History (reviewed - no changes required): Her father is 71, he has Alzheimer's disease. Her mother had Huntington's disease, she died at age 81, from a pulmonary embolism; HD symptoms started around age 16. Her maternal grandmother died in her 30s. She has a sister, age 41, tested negative for the HD gene; she has a diagnosis of depression, she attempted suicide in around 2005. She has one son, born in 46; one daughter born in 48; both are asymptomatic (the daughter has a baby boy). Other members of the family with HD include: maternal uncle, who died at age 10.   Mother - huntingtons  Father - alzheimers  Sister - anxiety  Social History (reviewed - no changes required): Marital Status: married,   Children: 2,   Employment Status: employed part-time,   Occupation: Lawyer  Patient Lives at: home,   Caffeine per Day: 0  Alcohol Use: occasionally  Drug Use: none  Tobacco Usage:non-smoker        Vital Signs   Height: 65 in.  Weight: 115 lbs.     Allergies  ! ERYTHROMYCIN BASE (ERYTHROMYCIN BASE TBEC)    Medications on Intake:    ALVESCO 160 MCG/ACT AERS (CICLESONIDE)   SERTRALINE HCL 100 MG TABS (SERTRALINE HCL) 1 tab daily.  CLONAZEPAM 0.5 MG TABS (CLONAZEPAM) 1 tab at bedtime.  * COENZYME Q10 TAB 800 mg per day.    Medications reviewed, updated and verified with patient or patient representative.  Previous Screening:   Screening for unhealthy alcohol use performed. (12/05/2009 8:29:01 AM)      Physical Examination        Unified Huntington's Disease Rating Scale     Motor Assessment        Ocular pursuit - horizontal: 1 - Jerky movement       Ocular pursuit - vertical: 1 - Jerky movement       Saccade initiation - horizontal: 2 - Suppressible blinks or head movements to initiate       Saccade initiation - vertical: 2 - Suppressible blinks or head movements to initiate       Saccade velocity -  horizontal: 1 - Mild slowing       Saccade velocity - vertical: 1 - Mild slowing       Dysarthria: 1 - Unclear, no need to repeat       Tongue protrusion: 2 - Cannot keep fully protruded for 5 seconds       Finger taps - right: 1 - Mild slowing and/or reduction in amplitude (11-14/5 seconds)       Finger taps - left: 1 - Mild slowing and/or reduction in amplitude (11-14/5 seconds)       Pronate/supinate hand, right: 1 - Mild slowing and/or irregular       Pronate/supinate hand, left: 1 - Mild slowing and/or irregular       Luria (fist-hand-palm test): 1 - < 4 in 10 seconds, no cue       Rigidity - arms, right: 0 - Absent       Rigidity - arms, right: 0 - Absent       Body bradykinesia: 1 - Minimally slow (? normal)       Maximal dystonia - trunk: 1 - Slight/intermittent       Maximal dystonia - RUE: 0 - Absent       Maximal dystonia - LUE: 0 - Absent       Maximal dystonia - RLE: 0 - Absent       Maximal dystonia - LLE: 0 - Absent       Maximal chorea - face: 1 - Slight/intermittent       Maximal chorea - BOL: 0 - Absent       Maximal chorea - trunk: 2 - Mild/common or moderate/intermittent       Maximal chorea - RUE: 2 - Mild/common or moderate/intermittent       Maximal chorea - LUE: 2 - Mild/common or moderate/intermittent       Maximal chorea - RLE: 2 - Mild/common or moderate/intermittent       Maximal chorea - LLE: 2 - Mild/common or moderate/intermittent       Gait: 1 - Wide base and/or slow       Tandem walking: 1 - 1 to 3 deviations from straight line       Retropulsion pull test: 2 - Would fall if not caught         Diagnosis Confidence Level: 4 - Motor abnormalities that are unequivocal signs of HD; greater than 99% confidence    Motor Assessment Total:   33    Motor Assessment Evaluated by: Liz Malady MD  September 04, 2010 2:03 PM        Problems:    CHOREA - HUNTINGTON'S 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  On exam she has mild chorea and bradykinesia, mild abnormality in tandem walking, motor impersistence (enough signs for a clinical diagnosis of HD). 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 She will continue taking sertraline for now. The dose of clonazepam should be lowered at this time (side effects). Treatment with a low dose of tetrabenazine is reasonable at this time. Regular stretching exercises were recommended.  I extensively discussed these issues with the patient and her husband, and answered all their questions.    Plan   1. Decrease the dose of clonazepam. 2. Start tetrabenazine at a low dose.    Medications:   New Prescriptions/Refills:  XENAZINE 12.5 MG TABS (TETRABENAZINE) Start 0.5 tab at bedtime; increase to 0.5 tab twice a day after one month.  #90 x 3, 09/04/2010, Christina Deidesheimer  CLONAZEPAM 0.5 MG TABS (CLONAZEPAM) 0.5 tab at bedtime.  #45 x 1, 09/04/2010, Christina Deidesheimer      Disposition:     Return to clinic in 3 months      Prescriptions:  XENAZINE 12.5 MG TABS (TETRABENAZINE) Start 0.5 tab at bedtime; increase to 0.5 tab twice a day after one month.  #90 x 3   Entered by: Rexford Maus   Authorized by: Liz Malady MD   Signed by: Rexford Maus on 09/04/2010   Method used: Print then Give to Patient   RxID: [id number]  CLONAZEPAM 0.5 MG TABS (CLONAZEPAM) 0.5 tab at bedtime.  #45 x 1   Entered by: Rexford Maus   Authorized by: Liz Malady MD   Signed by: Rexford Maus on 09/04/2010   Method used: Print then Give to Patient   RxID: [id number]

## 2010-09-09 NOTE — Unmapped (Signed)
Signed by Yves Dill MA on 09/09/2010 at 11:12:47      Coordinating Care Providers   PCP Name: Sherian Maroon, MD  PCP Address: [facility]; [address]; [place] [address]; Phone [phone], Fax [phone]

## 2010-09-10 NOTE — Unmapped (Signed)
Signed by Rexford Maus on 09/11/2010 at 08:51:01    PHONE NOTE  Call back at Home Phone: [phone]  Caller: patient  Department: Neurology  Call for: Amberlie Gaillard    Reason for Call: pt needs to speak with MA ASAP - no reason given when asked      Initial call taken by: Juliette Alcide Rippy,  September 10, 2010 11:41 AM      FOLLOW UP  Called patient.  Patient has appt. tomorrow with Dr.   phone call completed

## 2010-10-05 NOTE — Unmapped (Signed)
Signed by Rexford Maus on 10/07/2010 at 14:44:10    PHONE NOTE  Caller's Cell Phone #: [phone]  Caller: patient  Department: Neurology  Call for: Heywood Footman    Reason for Call: needs early refill of clonazapam .5mg  tablets - pt wants to pick it up on Friday, uses [facility] [phone]      Initial call taken by: Juliette Alcide Rippy,  October 05, 2010 10:37 AM      FOLLOW UP  pt still has one refill on rx so its ok to get refilll. lm on vmail adv her of that  Follow-up by:  Rexford Maus,  October 07, 2010 2:44 PM

## 2010-11-27 ENCOUNTER — Inpatient Hospital Stay

## 2010-11-27 NOTE — Unmapped (Signed)
Signed by Yves Dill MA on 12/01/2010 at 14:51:12                        [facility]         Neurology         [address]         [place], [place] [address]         p [phone] f [phone]         [email or web address]      Dec 01, 2010      RE: Brenda [name]  DOB:   08/10/1955      Dear Dr. Sherian Maroon;     I had the pleasure of seeing your patient, Brenda [name], in consultation in the [facility] on 11/27/2010.     History of Present Illness:  She is here for a follow up visit. She discontinued sertraline. She is taking clonazepam, it is adequately controlling her symptoms. She did not feel the need to start tetrabenazine. 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 Her initial history follows: she first noticed racy, speedy spells in December 2010. These spells consist of her mind racing, she starts speaking, her muscles get tight. Initially she thought these spells were due to taking steroids (for asthma). Shortly afterwards she noticed generalized involuntary movements. She describes them as twitches. She started dropping objects, since mid 2011. She denies difficulty walking, but she admits that her balance can be off at times. In November 2011 she was rocking in a chair and she fell backwards with the whole chair. In 2010 she was trying to step over a dog gate and she fell and hit her face; she suffered a left orbital fracture. She has noticed that she is bumping into things while walking. She still walks and plays golf. She denies hallucinations, delusions, lightheadedness, fainting spells, daytime sedation, nausea or vomiting. She denies memory problems. She admits occasional feelings of depression, especially in the winter. She started sertraline in December 2011, up to 150 mg qd; last increased 10 days ago. It helps some, but she is not sure about the details. She  sleeps well, she believes the sertraline helps with her sleep. She denies urinary incontinence or constipation. She has anxiety, that occurs in spells.    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 On exam she has mild chorea and bradykinesia, mild abnormality in tandem walking, motor impersistence (enough signs for a clinical diagnosis of HD). 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 She stopped sertraline since the last visit. The dose of clonazepam is lower and it is providing benefit. Treatment with a low dose of tetrabenazine would be reasonable but she prefers to postpone at this time. Regular stretching exercises were recommended.  I extensively discussed these issues with the patient, and answered all her questions.     Plan:  1. Same dose of clonazepam.   2. Consider treatment with tetrabenazine in the future.   3. Regular exercise program.       Thank you for allowing me to participate in the care of this pleasant patient.  If you would like a copy of my office note, please contact  our Medical Records department at [phone].  Please feel free to contact me should you have any concerns or questions.    Sincerely,      Alric Ran, MD

## 2010-11-27 NOTE — Unmapped (Signed)
Signed by Liz Malady MD on 11/27/2010 at 13:52:56    [facility]  ___________________________________________________________________________  Office Visit    History of Present Illness   Patient accompanied by and additional history obtained from: her husband.    She is here for a f/u visit. She discontinued sertraline. She is taking clonazepam, it is adequately controlling her symptoms. She did not feel the need to start tetrabenazine. 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 Her initial history follows: she first noticed racy, speedy spells in December 2010. These spells consist of her mind racing, she starts speaking, her muscles get tight. Initially she thought these spells were due to taking steroids (for asthma). Shortly afterwards she noticed generalized involuntary movements. She describes them as twitches. She started dropping objects, since mid 2011. She denies difficulty walking, but she admits that her balance can be off at times. In November 2011 she was rocking in a chair and she fell backwards with the whole chair. In 2010 she was trying to step over a dog gate and she fell and hit her face; she suffered a left orbital fracture. She has noticed that she is bumping into things while walking. She still walks and plays golf. She denies hallucinations, delusions, lightheadedness, fainting spells, daytime sedation, nausea or vomiting. She denies memory problems. She admits occasional feelings of depression, especially in the winter. She started sertraline in December 2011, up to 150 mg qd; last increased 10 days ago. It helps some, but she is not sure about the details. She sleeps well, she believes the sertraline helps with her sleep. She denies urinary incontinence or constipation. She has anxiety, that occurs in spells.      Other Symptoms:     Falls:   denies  Difficulty with sleep:   denies  Cognitive  problems:   present  Depression:   denies      PAST HISTORY  Past Medical History (reviewed - no changes required):  asthma, allergies.  Surgical History (reviewed - no changes required):  s/p sinus surgery; s/p tonsilectomy.    Family History (reviewed - no changes required): Her father is 54, he has Alzheimer's disease. Her mother had Huntington's disease, she died at age 84, from a pulmonary embolism; HD symptoms started around age 68. Her maternal grandmother died in her 30s. She has a sister, age 31, tested negative for the HD gene; she has a diagnosis of depression, she attempted suicide in around 2005. She has one son, born in 66; one daughter born in 41; both are asymptomatic (the daughter has a baby boy). Other members of the family with HD include: maternal uncle, who died at age 67.   Mother - huntingtons  Father - alzheimers  Sister - anxiety  Social History (reviewed - no changes required): Marital Status: married,   Children: 2,   Employment Status: employed part-time,   Occupation: Lawyer  Patient Lives at: home,   Caffeine per Day: 0  Alcohol Use: occasionally  Drug Use: none  Tobacco Usage:non-smoker        Vital Signs   Height: 65 in.  Weight: 126.4 lbs. BMI (in-lb): 21.11  BSA (m2): 1.63  Pulse rate: 84 Pulse rhythm: regular   Blood Pressure   BP #1: 120 / 78mm Hg  Cuff Size: Std     Allergies  ! ERYTHROMYCIN BASE (ERYTHROMYCIN BASE TBEC)    Medications on Intake:  CLONAZEPAM 0.5 MG TABS (CLONAZEPAM) 0.5 tab twice a day, plus 0.25 tab in PM.  ALVESCO 160 MCG/ACT AERS (CICLESONIDE)   * COENZYME Q10 TAB 800 mg per day.  ALBUTEROL SULFATE  NEBU (ALBUTEROL SULFATE NEBU)   * MVI     Medications reviewed, updated and verified with patient or patient representative.    Screening for unhealthy alcohol use performed.  Previous Screening:   Screening for unhealthy alcohol use performed. (12/05/2009 8:29:01 AM)    Depression Screening:     During the past month,   Have you often been  bothered by feeling down, depressed or hopeless? Patient declined  Have you often been bothered by little interest or pleasure in doing things? Patient declined      Physical Examination        Unified Huntington's Disease Rating Scale     Motor Assessment        Ocular pursuit - horizontal: 1 - Jerky movement       Ocular pursuit - vertical: 1 - Jerky movement       Saccade initiation - horizontal: 2 - Suppressible blinks or head movements to initiate       Saccade initiation - vertical: 2 - Suppressible blinks or head movements to initiate       Saccade velocity - horizontal: 1 - Mild slowing       Saccade velocity - vertical: 1 - Mild slowing       Dysarthria: 1 - Unclear, no need to repeat       Tongue protrusion: 2 - Cannot keep fully protruded for 5 seconds       Finger taps - right: 1 - Mild slowing and/or reduction in amplitude (11-14/5 seconds)       Finger taps - left: 1 - Mild slowing and/or reduction in amplitude (11-14/5 seconds)       Pronate/supinate hand, right: 1 - Mild slowing and/or irregular       Pronate/supinate hand, left: 1 - Mild slowing and/or irregular       Luria (fist-hand-palm test): 1 - < 4 in 10 seconds, no cue       Rigidity - arms, right: 0 - Absent       Rigidity - arms, right: 0 - Absent       Body bradykinesia: 1 - Minimally slow (? normal)       Maximal dystonia - trunk: 1 - Slight/intermittent       Maximal dystonia - RUE: 0 - Absent       Maximal dystonia - LUE: 0 - Absent       Maximal dystonia - RLE: 0 - Absent       Maximal dystonia - LLE: 0 - Absent       Maximal chorea - face: 1 - Slight/intermittent       Maximal chorea - BOL: 0 - Absent       Maximal chorea - trunk: 1 - Slight/intermittent       Maximal chorea - RUE: 2 - Mild/common or moderate/intermittent       Maximal chorea - LUE: 2 - Mild/common or moderate/intermittent       Maximal chorea - RLE: 2 - Mild/common or moderate/intermittent       Maximal chorea - LLE: 2 - Mild/common or moderate/intermittent        Gait: 1 - Wide base and/or slow       Tandem walking: 1 - 1 to 3 deviations from straight line  Retropulsion pull test: 2 - Would fall if not caught         Diagnosis Confidence Level: 4 - Motor abnormalities that are unequivocal signs of HD; greater than 99% confidence    Motor Assessment Total:   32    Motor Assessment Evaluated by: Liz Malady MD  Nov 27, 2010 1:50 PM        Problems:    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 On exam she has mild chorea and bradykinesia, mild abnormality in tandem walking, motor impersistence (enough signs for a clinical diagnosis of HD). 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 She stopped sertraline since the last visit. The dose of clonazepam is lower and it is providing benefit. Treatment with a low dose of tetrabenazine would be reasonable but she prefers to postpone at this time. Regular stretching exercises were recommended.  I extensively discussed these issues with the patient, and answered all her questions.    Plan   1. Same dose of clonazepam. 2. Consider treatment with tetrabenazine in the future. 3. Regular exercise program.    Disposition:     Return to clinic in 4 months

## 2010-12-21 ENCOUNTER — Ambulatory Visit: Payer: Self-pay | Admitting: General Surgery

## 2011-01-03 LAB — HM MAMMOGRAPHY

## 2011-03-04 NOTE — Unmapped (Signed)
Would like to speak with you regarding possibly scheduling with Dr. Artist Beach but has questions I could not answer.

## 2011-03-05 NOTE — Unmapped (Signed)
Returned call and left message.

## 2011-03-10 NOTE — Telephone Encounter (Signed)
Error-

## 2011-03-23 ENCOUNTER — Ambulatory Visit: Payer: Self-pay | Admitting: Family Medicine

## 2011-03-26 ENCOUNTER — Ambulatory Visit: Admit: 2011-03-26 | Discharge: 2011-03-26 | Payer: PRIVATE HEALTH INSURANCE | Attending: Neurology

## 2011-03-26 ENCOUNTER — Inpatient Hospital Stay

## 2011-03-26 DIAGNOSIS — G10 Huntington's disease: Secondary | ICD-10-CM

## 2011-03-26 NOTE — Unmapped (Signed)
Subjective:      Patient ID: Brenda [name] is a 55 y.o. female.    HPI   Movement Disorders HPI    She is here for a f/u visit. Continues with clonazepam. It continues to work well for the chorea and the anxiety. 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 Racing thoughts are sometimes present but these are helped by the Klonopin.     Has filled out health care power of attorney. Able to drive herself, is careful, sometimes becomes nervous when in an unfamiliar place.     She discontinued sertraline. She did not feel the need to start tetrabenazine, partially because it would be difficult to get. 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 Her initial history follows: she first noticed racy, speedy spells in December 2010. These spells consist of her mind racing, she starts speaking, her muscles get tight. Initially she thought these spells were due to taking steroids (for asthma). Shortly afterwards she noticed generalized involuntary movements. She describes them as twitches. She started dropping objects, since mid 2011. She denies difficulty walking, but she admits that her balance can be off at times. In November 2011 she was rocking in a chair and she fell backwards with the whole chair. In 2010 she was trying to step over a dog gate and she fell and hit her face; she suffered a left orbital fracture. She has noticed that she is bumping into things while walking. She still walks and plays golf. She denies hallucinations, delusions, lightheadedness, fainting spells, daytime sedation, nausea or vomiting. She denies memory problems. She admits occasional feelings of depression, especially in the winter. She started sertraline in December 2011, up to 150 mg qd; last increased 10 days ago. It helps some, but  she is not sure about the details. She sleeps well, she believes the sertraline helps with her sleep. She denies urinary incontinence or constipation. She has anxiety, that occurs in spells.     PD Quality Metrics  Falls (Review Each Visit): denies           Depression (Review Annually): present  mostly positive mood, but does have down periods  Cognitive Impairment (Review Annually): present  some difficulty commiting details to memory       Histories:     She has a past medical history of Asthma and Allergy.    She has past surgical history that includes Sinus surgery and Tonsillectomy.    Her family history includes Alzheimer's disease in her father; Anxiety disorder in her sister; Depression in her sister; Huntington's disease in her maternal uncle; Huntington's disease (age of onset:60) in her mother; Pulmonary embolism in her mother; and Suicidality in her sister.    She reports that she has never smoked. She does not have any smokeless tobacco history on file. She reports that she drinks about .6 ounces of alcohol per week. She reports that she does not use illicit drugs.      Review of Systems reviewed and negative except as per HPI.     Allergies:   Erythromycin base and Sulfa(sulfonamide antibiotics)    Medications:     Outpatient Encounter Prescriptions as of 03/26/2011   Medication Sig Dispense Refill   ??? ALBUTEROL SULFATE INHL Inhale into the lungs. Albuterol Sulfate  Nebu        ??? ciclesonide (ALVESCO) 160 mcg/actuation inhaler Inhale into the lungs.         ??? clonazepam (KLONOPIN) 0.5 MG tablet Take 0.25 mg by mouth 4 times a day as needed. Plus 0.25 tab in PM       ??? MULTIVITAMIN ORAL Take by mouth. Multivitamin        ??? UBIDECARENONE (COENZYME Q10, BULK, MISC) Take 800 mg by mouth daily. Coenzyme Q 10 Tab             Objective:       Blood pressure 160/59, pulse 84, height 5' 5 (1.651 m), weight 128 lb (58.06 kg).    Neurologic Exam    Physical Exam    UHDRS 1: Motor Examination  Huntington's Disease  Rating Scale  Ocular pursuit, horizontal: 1  Ocular pursuit, vertical: 1  Saccade initiation, horizontal: 0  Saccade initiation, vertical: 0  Saccade velocity, hortizontal : 1  Saccade velocity, vertical: 1  Dysarthria : 1  Tongue Protrusion: 1  Fingers taps, right: 1  Fingers taps, left : 1  Pronate/suppinate hands, right : 1  Pronate/supinate hands, left : 1  Luria (fist-hand-palm test): 0  Rigidity arms, right : 0  Rigidity arms, left: 0  Body Bradykinesia: 1  Maximal dystonia, trunk: 0  Maximal dystonia, RUE: 0  Maximal dystonia, LUE: 0  Maximal dystonia, RLE: 0  Maximal dystonia, LLE: 0  Maximal chorea, face: 1  Maixmal chorea, BOL: 0  Maximal chorea, trunk: 0   Maximal chorea, RUE: 1  Maximal chorea, LUE: 1  Maximal chorea, RLE: 1  Maximal chorea, LLE: 1  Gait: 1  Tandem Walking: 2  Retropulsion Pull Test: 0  UHDRS 1 TOTAL:: 19      Assessment:     1. Huntington's chorea      She has family history of HD, and her history and examination are consistent with a clinical diagnosis of Huntington's disease. On exam she has mild chorea and bradykinesia, mild abnormality in tandem walking, motor impersistence (enough signs for a clinical diagnosis of HD). 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 She stopped sertraline since the last visit. The dose of clonazepam is lower and it is providing benefit. Treatment with a low dose of tetrabenazine would be reasonable but she prefers to postpone at this time. Regular stretching exercises were recommended. I extensively discussed these issues with the patient, and answered all her questions      Plan:     1. Clonazepam, no changes to dose. Continue 0.25 mg up to four times per day. Managed by the patient's psychiatrist.  2. Consider treatment with tetrabenazine in the future.   3. Regular exercise program.  4. Continue to follow w/ Psychiatry.   5. Discussed the risks associated with continued driving. Patient will discuss further with  husband.

## 2011-04-14 MED ORDER — AZITHROMYCIN 250 MG PO TABS
250 MG | ORAL_TABLET | Freq: Every day | ORAL | Status: AC
Start: 2011-04-14 — End: 2011-04-19

## 2011-04-14 NOTE — Patient Instructions (Signed)
1. zithromax 250mg . 2 tablets today followed by 1 daily for 4 days   2. Saline nasal irrigation 2 times daily  3. mucinex 1 tables 2 times daily  4. Increase fluids    Thank you for enrolling in MyChart. Please follow the instructions below to securely access your online medical record. MyChart allows you to send messages to your doctor, view your test results, renew your prescriptions, schedule appointments, and more.     How Do I Sign Up?  1. In your Internet browser, go to [email or web address]  2. Click on the Sign Up Now link in the Sign In box. You will see the New Member Sign Up page.  3. Enter your MyChart Access Code exactly as it appears below. You will not need to use this code after you've completed the sign-up process. If you do not sign up before the expiration date, you must request a new code.  MyChart Access Code: [email or web address]  Expires: 06/13/2011 12:01 PM    4. Enter your Social Security Number ([id number]) and Date of Birth (mm/dd/yyyy) as indicated and click Submit. You will be taken to the next sign-up page.  5. Create a MyChart ID. This will be your MyChart login ID and cannot be changed, so think of one that is secure and easy to remember.  6. Create a MyChart password. You can change your password at any time.  7. Enter your Password Reset Question and Answer. This can be used at a later time if you forget your password.   8. Enter your e-mail address. You will receive e-mail notification when new information is available in MyChart.  9. Click Sign Up. You can now view your medical record.     Additional Information  If you have questions, please contact your physician practice where you receive care. Remember, MyChart is NOT to be used for urgent needs. For medical emergencies, dial 911.

## 2011-04-14 NOTE — Progress Notes (Signed)
Subjective:      Patient ID: Danielle Lawrence is a 55 y.o. female.    HPI 1 week ago began feeling tired, weak, congested. Off work for 4 days last week, low grade temp 99.5. Taking mucinex, sudafed. Now c facial pain and occasional cough. Feeling tired, sleeping more than usual.    Review of Systems   Constitutional: Positive for fever, chills, appetite change and fatigue.   HENT: Positive for congestion and sore throat. Negative for ear pain.    Eyes: Negative.    Respiratory: Positive for cough. Negative for chest tightness and shortness of breath.    Cardiovascular: Negative for chest pain and palpitations.   Gastrointestinal: Negative for nausea, vomiting and constipation.   Genitourinary: Negative.    Neurological: Positive for light-headedness and headaches. Negative for dizziness.       Objective:   Physical Exam   Constitutional: She is oriented to person, place, and time. She appears well-developed and well-nourished.   HENT:   Head: Normocephalic.   Right Ear: External ear normal.   Left Ear: External ear normal.   Mouth/Throat: Oropharynx is clear and moist.        Nares red, thick yellow drng bilateral.   Eyes: Conjunctivae and EOM are normal. Pupils are equal, round, and reactive to light.   Neck: Normal range of motion. Neck supple. No JVD present. No tracheal deviation present. No thyromegaly present.   Cardiovascular: Normal rate, regular rhythm, normal heart sounds and intact distal pulses.    No murmur heard.  Pulmonary/Chest: Effort normal and breath sounds normal. No respiratory distress. She has no wheezes. She has no rales.   Abdominal: Soft. Bowel sounds are normal. She exhibits no distension. There is no tenderness.   Musculoskeletal: She exhibits no edema and no tenderness.        Mildly spastic motion of body, not new.    Lymphadenopathy:     She has no cervical adenopathy.   Neurological: She is alert and oriented to person, place, and time. No cranial nerve deficit. Coordination abnormal.    Skin: Skin is warm and dry. No rash noted. No erythema.   Psychiatric: She has a normal mood and affect. Her behavior is normal. Judgment and thought content normal.       Assessment:    1. Sinusitis  2. Fatigue  3. Hx of asthma        Plan:    1. zithromax 250mg . 2 tablets today followed by 1 daily for 4 days  2. Saline nasal irrigation 2 times daily  3. mucinex 1 tables 2 times daily  4. Increase fluids

## 2011-04-15 MED ORDER — CEFUROXIME AXETIL 250 MG PO TABS
250 MG | ORAL_TABLET | Freq: Two times a day (BID) | ORAL | Status: AC
Start: 2011-04-15 — End: 2011-04-26

## 2011-04-15 NOTE — Telephone Encounter (Signed)
Pt was here yesterday and started taking zithromax, she took 2 pills and had sever abdominal pains and she stopped taking them. Pt wants to know if she would be able to take augmenten instead.

## 2011-04-15 NOTE — Telephone Encounter (Signed)
Pt informed Rx sent

## 2011-04-15 NOTE — Telephone Encounter (Signed)
Augmentin would probably upset her stomach as well. WIll px ceftin 250mg  #20. Take 1 tablet every 12 hours with food. Should not begin until tomorrow. Stop zithromax now.

## 2011-04-19 NOTE — Telephone Encounter (Signed)
Call from patient stating she continues to have facial pressure. Originally px zithromax at her request. Later c/o upset stomach. Discontinued and px ceftin. Has taken for 5 days s improvement. Would have her f/u with ENT, either Dr. Leo Rod or Dr. Maximino Sarin.

## 2011-04-19 NOTE — Telephone Encounter (Signed)
Informed patient to go see and ENT. She has seen Dr. Maximino Sarin before so she will follow up with him.

## 2011-04-20 NOTE — Telephone Encounter (Signed)
Patient saw you and was given Ceftin for a sinus infection. Can't see Dr. Maximino SarinMarathe until Monday. Face is still pounding. Can you call in Augmentin into [facility] [phone]. Hates to wait all week. [place] SpanielSaid ok to wait until you are in tomorrow to address. Her number is [phone]

## 2011-04-21 NOTE — Telephone Encounter (Signed)
Left msg with pt asking to call back to see what pharmacy she wants the augmentin sent to. She also needs to be informed to keep her appt with the ENT

## 2011-04-21 NOTE — Telephone Encounter (Signed)
Augmentin 875mg  #20 tablets. 1 every 12 hours to whatever pharmacy she wants. Still needs to keep appointment with ENT.

## 2011-04-21 NOTE — Telephone Encounter (Signed)
Left msg with pt to call back

## 2011-04-21 NOTE — Telephone Encounter (Signed)
Patient called back and I called Rx to [facility]

## 2011-05-18 NOTE — Telephone Encounter (Signed)
Patient called because she has had a sinus infection for 2 months now. Debbie tried to treat her but it did not work out, she was then referred to an ENT Dr. Danne Harbor? She states that she is having breathing problems and is now seeing a psychiatrist and they want her to have some labs, WBC and Blood gases checked. Can you write and order for her?

## 2011-05-18 NOTE — Telephone Encounter (Signed)
Patient said her psychiatrist wants those two tests to see if her immune system is compromised due to her diagnosis of Huntington's disease

## 2011-05-18 NOTE — Telephone Encounter (Signed)
Need to know exact labs and diagnosis to go with them

## 2011-05-19 NOTE — Unmapped (Signed)
Instructed patient to follow up with primary care, is a HD pt, she has appt, Monday.

## 2011-05-19 NOTE — Unmapped (Signed)
Pt is so out of breath, seems like she is catching every cold,chest gets so tight

## 2011-05-21 NOTE — Unmapped (Signed)
Seeing a pcp in [place] for huntington disease

## 2011-05-24 ENCOUNTER — Ambulatory Visit: Admit: 2011-05-24 | Payer: PRIVATE HEALTH INSURANCE

## 2011-05-24 DIAGNOSIS — J329 Chronic sinusitis, unspecified: Secondary | ICD-10-CM

## 2011-05-24 NOTE — Unmapped (Signed)
Patient has chronic sinus problems, asthma, allergies. She is on steriod inhaler, Albuterol, and on Levaquin currently. She sees ENT and Allergy. Advised patient to use [employer]. She is not wheezing currently and does not need systemic steroids. Advised her to continue follow up with ENT and Allergy.

## 2011-05-24 NOTE — Unmapped (Signed)
Subjective  HPI:   Patient ID: Brenda [name] is a 55 y.o. female.    Chief Complaint:  URI   This is a chronic problem. Episode onset: since 04/06/2011, it started with congestion, sleepiness. The problem has been gradually improving (since last 2 days). There has been no fever. Associated symptoms include congestion, coughing (post nasal dripping), rhinorrhea, sinus pain, sneezing and wheezing. Pertinent negatives include no abdominal pain, chest pain, diarrhea, dysuria, headaches, nausea, rash or sore throat. She has tried inhaler use and antihistamine for the symptoms.            She sees ENT Dr Leo Rod who put her on a 30-day trial of atomizer machine which as Betamethasone and Levaquin sprays  twice a day. She has been using it for 2 weeks now.    She complains of being very fatigued and sleepy for 2 months now. She states My body is changing. She sees ENT, allergy & immunology. She has been on steroids in the past.    She has osteopenia and needs DEXA scan, last one more than 2 years ago. She wants a screening mammogram and routine labs.       ROS:   Review of Systems   HENT: Positive for congestion, rhinorrhea and sneezing. Negative for sore throat.    Respiratory: Positive for cough (post nasal dripping) and wheezing.    Cardiovascular: Negative for chest pain.   Gastrointestinal: Negative for nausea, abdominal pain and diarrhea.   Genitourinary: Negative for dysuria.   Skin: Negative for rash.   Neurological: Negative for headaches.          Objective:   Physical Exam   Vitals reviewed.  Constitutional: She is oriented to person, place, and time. She appears well-nourished. No distress.   HENT:   Head: Normocephalic and atraumatic.   Right Ear: External ear normal.   Left Ear: External ear normal.   Nose: Mucosal edema present. Right sinus exhibits maxillary sinus tenderness. Right sinus exhibits no frontal sinus tenderness. Left sinus exhibits maxillary sinus tenderness. Left sinus exhibits no frontal sinus  tenderness.   Mouth/Throat: No oropharyngeal exudate or posterior oropharyngeal erythema.   Eyes: Pupils are equal, round, and reactive to light.   Neck: Neck supple.   Cardiovascular: Normal rate, regular rhythm and normal heart sounds.    Pulmonary/Chest: Breath sounds normal. No respiratory distress. She has no wheezes.   Abdominal: Soft. Bowel sounds are normal.   Neurological: She is alert and oriented to person, place, and time.        She has choreiform movements   Psychiatric: Her mood appears anxious. Her speech is tangential.             Filed Vitals:    05/24/11 0920   BP: 110/74   Pulse: 92   Temp: 97.5 ??F (36.4 ??C)   Height: 5' 5 (1.651 m)   Weight: 122 lb (55.339 kg)     Body mass index is 20.30 kg/(m^2).  Body surface area is 1.59 meters squared.                Assessment/Plan:     Need for breast cancer screening - order screening mammogram    Need for lipid screening - Check fasting lipid panel

## 2011-05-24 NOTE — Unmapped (Addendum)
She complains of fatigue and sleepiness for 2 months. I believe fatigue is multifactorial - chronic illness, sinus problems, anxiety / depression, medication related (takes Clonazepam).  - Will check routine labs (CBC, renal, TSH).

## 2011-05-24 NOTE — Unmapped (Signed)
Pt has appt 1/25 with dr. Heywood Footman

## 2011-05-24 NOTE — Unmapped (Signed)
Patient has used steroids multiple times in the past. She has osteopenia, last DEXA more than 2 years ago. Order DEXA today. She is on Ca/Vitamin D daily, exercises and plays golf.

## 2011-06-02 LAB — LIPID PANEL
Cholesterol, Total: 234 mg/dL — ABNORMAL HIGH (ref <200)
Cholesterol, Total: 234 mg/dl — ABNORMAL HIGH (ref <200)
HDL: 76 mg/dL — ABNORMAL HIGH (ref 40–60)
HDL: 76 mg/dl — ABNORMAL HIGH (ref 40–60)
LDL Calculated: 129 mg/dl — ABNORMAL HIGH (ref <100)
LDL Cholesterol: 129 mg/dL — ABNORMAL HIGH (ref <100)
Triglycerides: 140 mg/dL (ref <150)
Triglycerides: 140 mg/dl (ref <150)
VLDL CHOL: 28 mg/dL
VLDL Cholesterol Calculated: 28 mg/dl

## 2011-06-02 LAB — COMPREHENSIVE METABOLIC PANEL
ALT: 36 U/L (ref 10–40)
ALT: 36 U/L (ref 10–40)
AST: 39 U/L — ABNORMAL HIGH (ref 15–37)
AST: 39 U/L — ABNORMAL HIGH (ref 15–37)
Albumin: 5.1 g/dL — ABNORMAL HIGH (ref 3.4–5.0)
Albumin: 5.1 gm/dl — ABNORMAL HIGH (ref 3.4–5.0)
Alkaline Phosphatase: 65 U/L (ref 45–129)
Alkaline Phosphatase: 65 U/L (ref 45–129)
BUN: 13 mg/dL (ref 7–18)
BUN: 13 mg/dl (ref 7–18)
CO2: 32 meq/L (ref 21–32)
CO2: 32 mEq/L (ref 21–32)
Calcium: 9.9 mg/dL (ref 8.3–10.6)
Calcium: 9.9 mg/dl (ref 8.3–10.6)
Chloride: 104 meq/L (ref 99–110)
Chloride: 104 mEq/L (ref 99–110)
Creatinine: 0.8 mg/dL (ref 0.6–1.1)
Creatinine: 0.8 mg/dl (ref 0.6–1.1)
GFR Est, African/Amer: >60
GFR MDRD Af Amer: >60
GFR MDRD Non Af Amer: >60 (ref >60)
GFR, Estimated: >60 (ref >60)
Glucose: 83 mg/dL (ref 70–99)
Glucose: 83 mg/dl (ref 70–99)
Potassium: 4.1 meq/L (ref 3.5–5.1)
Potassium: 4.1 mEq/L (ref 3.5–5.1)
Sodium: 144 meq/L (ref 136–145)
Sodium: 144 mEq/L (ref 136–145)
Total Bilirubin: 0.4 mg/dL (ref 0.0–1.0)
Total Bilirubin: 0.4 mg/dl (ref 0.0–1.0)
Total Protein: 8.2 g/dL (ref 6.4–8.2)
Total Protein: 8.2 gm/dl (ref 6.4–8.2)

## 2011-06-02 LAB — CBC AND DIFFERENTIAL
Basophils %, CSF: 0.4 % (ref 0.0–2.0)
Basophils Absolute: 0 10*3 (ref 0.0–0.2)
Eosinophils Absolute: 0.2 10*3 (ref 0.0–0.6)
Eosinophils, CSF: 2.9 % (ref 0.0–5.0)
Hematocrit: 42.3 % (ref 36.0–48.0)
Hemoglobin: 14.3 gm/dl (ref 12.0–16.0)
Lymphocytes Absolute: 1.8 10*3 (ref 1.0–5.1)
Lymphocytes Relative: 28.6 % (ref 25.0–40.0)
MCH: 31.2 pg (ref 26–34)
MCHC: 33.9 gm/dl (ref 31–36)
MCV: 92 fl (ref 80–100)
MPV: 10.4 fl (ref 5.0–10.5)
Monocytes Absolute: 0.3 10*3 (ref 0.0–1.3)
Monocytes Relative: 4.9 % (ref 0.0–12.0)
Neutrophils Absolute: 3.9 10*3 (ref 1.7–7.7)
Neutrophils Relative: 63.2 % (ref 42.0–63.0)
PLT Morphology: 252 10*3 (ref 135–450)
RBC: 4.6 10*6 (ref 4.0–5.2)
RDW: 12.9 % (ref 11.5–14.5)
WBC: 6.1 10*3 (ref 4.0–11.0)

## 2011-06-02 LAB — T4, FREE
Free T4: 1.31 ng/dl (ref 0.9–1.8)
T4 Free: 1.31 ng/dl (ref 0.9–1.8)

## 2011-06-02 LAB — TSH
Free T4: 1.31 ng/dl (ref 0.9–1.8)
T4 Free: 1.31 ng/dl (ref 0.9–1.8)
TSH: 2.19 u[IU]/mL (ref 0.35–5.5)
TSH: 2.19 u[IU]/mL (ref 0.35–5.5)

## 2011-06-02 LAB — CBC WITH AUTO DIFFERENTIAL
Basophils %: 0.4 % (ref 0.0–2.0)
Basophils Absolute: 0 10*3 (ref 0.0–0.2)
Eosinophils %: 2.9 % (ref 0.0–5.0)
Eosinophils Absolute: 0.2 10*3 (ref 0.0–0.6)
Granulocyte Absolute Count: 3.9 10*3 (ref 1.7–7.7)
Hematocrit: 42.3 % (ref 36.0–48.0)
Hemoglobin: 14.3 gm/dl (ref 12.0–16.0)
Lymphocytes %: 28.6 % (ref 25.0–40.0)
Lymphocytes Absolute: 1.8 10*3 (ref 1.0–5.1)
MCH: 31.2 pg (ref 26–34)
MCHC: 33.9 gm/dl (ref 31–36)
MCV: 92 fl (ref 80–100)
MPV: 10.4 fl (ref 5.0–10.5)
Monocytes %: 4.9 % (ref 0.0–12.0)
Monocytes Absolute: 0.3 10*3 (ref 0.0–1.3)
Platelets: 252 10*3 (ref 135–450)
RBC: 4.6 10*6 (ref 4.0–5.2)
RDW: 12.9 % (ref 11.5–14.5)
Segs Relative: 63.2 % — ABNORMAL HIGH (ref 42.0–63.0)
WBC: 6.1 10*3 (ref 4.0–11.0)

## 2011-06-02 NOTE — Unmapped (Signed)
Inform patient that all her blood work is normal (CBC, liver, kidneys, thyroid, glucose, cholesterol).

## 2011-06-02 NOTE — Unmapped (Signed)
HD patient called to discuss future appt and possible disability. Was recently diagnosed, gave [facility]eth.

## 2011-06-02 NOTE — Unmapped (Signed)
CBC, CMP, TSH, lipid pane, free T4 (06/02/2011) normal.

## 2011-06-02 NOTE — Unmapped (Signed)
Asking for a call from MA to discuss disability

## 2011-06-03 NOTE — Unmapped (Signed)
FYI: The patient is having a CT scan on Monday for sinuses, and she will see the ENT.

## 2011-06-08 ENCOUNTER — Ambulatory Visit: Admit: 2011-06-08 | Discharge: 2011-06-08 | Payer: PRIVATE HEALTH INSURANCE | Attending: Otolaryngic Allergy

## 2011-06-08 DIAGNOSIS — J322 Chronic ethmoidal sinusitis: Secondary | ICD-10-CM

## 2011-06-08 NOTE — Unmapped (Signed)
Nasal endoscopy:  Nose sprayed with 4% lidocaine with afrin.  After adequate time for anesthesia, nasal endoscopy performed with 30 degree scope.    Pertinent findings:70 degree scope is clear.  No edema, mucus, crusting, obstr.  Nasopharynx normal.   Otherwise normal.

## 2011-06-08 NOTE — Unmapped (Signed)
Chief Complaint   Patient presents with   ??? Allergies   ??? Sinus Problem        History of Present Illness  C/o persistent sinusitis since Oct.  Pt has hx of intermittent infections, became more persistent in 2010, had surgery (Dr. Maximino Sarin).  Last yr developed incr asthma, was on pred from Dec to Aug, now on inhalers, zopinex, developed incr congestion and drainage with flare of her asthma in Oct.  Rt side has always been worse.  Consulted Dr. Leo Rod, has been on repeated abs, various rinses (baby shampoo and grapeseen extract - found online), nebulizer with ab and steroid (now levaquin and betamethasone), has not been on systemic steroid.  Hx allergies, was on immunotx 2000-2010, asthma managed by Dr. Mercer Pod.  Treatment is complicated by relatively recent dx of Huntington's which also impacts her breathing, and voice.  She has no RX for reflux but has lately been taking prilosec.  She did get out some thick mucus few days ago, main sxs now is throbbing pain at rt nasal cheek jn.  Also c/o fatigue.     Cancer Staging  No matching staging information was found for the patient.    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    She has past surgical history that includes Sinus surgery and Tonsillectomy.    Her family history includes Alzheimer's disease in her father; Anxiety disorder in her sister; Depression in her sister; Huntington's disease in her maternal uncle; Huntington's disease (age of onset:60) in her mother; Pulmonary embolism in her mother; and Suicidality in her sister.    She reports that she has never smoked. She has never used smokeless tobacco. She reports that she drinks about .6 ounces of  alcohol per week. She reports that she does not use illicit drugs.    Allergies  Zithromax; Bactrim; Erythromycin base; and Sulfa(sulfonamide antibiotics)    Medications  Outpatient Encounter Prescriptions as of 06/08/2011   Medication Sig Dispense Refill   ??? albuterol (PROAIR HFA) 90 mcg/actuation inhaler Inhale 2 puffs into the lungs every 6 hours as needed.       ??? calcium carbonate-vitamin D3 (CALCIUM+D) 400-133.3 mg-unit Tab Take 1 tablet by mouth 2 times a day.       ??? ciclesonide (ALVESCO) 160 mcg/actuation inhaler Inhale 1 puff into the lungs 2 times a day.        ??? clonazepam (KLONOPIN) 0.5 MG tablet Take 0.25 mg by mouth 4 times a day as needed.        ??? coenzyme Q10 400 mg Cap Take 800 mg by mouth 2 times a day.       ??? diphenhydrAMINE (BENADRYL) 25 mg tablet Take 25 mg by mouth at bedtime as needed.       ??? levalbuterol (XOPENEX) 0.63 mg/3 mL nebulizer solution Inhale 1 ampule by nebulization every 6 hours as needed.       ??? loratadine (CLARITIN) 10 mg tablet Take 10 mg by mouth daily as needed.       ??? MULTIVITAMIN ORAL Take 1 tablet by mouth daily.        ??? omega-3 fatty acids (FISH OIL) 500 mg Cap Take 1 tablet by mouth 2 times a day.  The following portions of the patient's history were reviewed and updated as appropriate: allergies, current medications, past family history, past medical history, past social history, past surgical history and problem list.    Review of Systems:  * See scanned Review of Systems sheet.    Vitals  Blood pressure 133/84, pulse 92, resp. rate 18, height 5' 5 (1.651 m), weight 121 lb (54.885 kg), last menstrual period 05/20/2011.  Physical Exam   Constitutional: She is oriented to person, place, and time. She appears well-developed and well-nourished.  Non-toxic appearance. No distress.   HENT:   Head: Normocephalic.   Right Ear: Tympanic membrane, external ear and ear canal normal.   Left Ear: Tympanic membrane, external ear and ear canal normal.   Nose: Nose  normal. Right sinus exhibits no maxillary sinus tenderness. Left sinus exhibits no maxillary sinus tenderness.   Mouth/Throat: Uvula is midline, oropharynx is clear and moist and mucous membranes are normal.   Eyes: Conjunctivae, EOM and lids are normal.   Neck: Normal range of motion. Neck supple. No tracheal deviation present. No thyromegaly present.   Pulmonary/Chest: No stridor. No respiratory distress.   Lymphadenopathy:        Head (right side): No submental, no submandibular, no preauricular and no occipital adenopathy present.        Head (left side): No submental, no submandibular, no preauricular and no occipital adenopathy present.     She has no cervical adenopathy.   Neurological: She is alert and oriented to person, place, and time.   Skin: Skin is warm and dry. She is not diaphoretic. No pallor.       Lab Review:   Lab Results   Component Value Date    WBC 6.1 06/02/2011    HGB 14.3 06/02/2011    HCT 42.3 06/02/2011    MCV 92.0 06/02/2011    CREATININE 0.8 06/02/2011    BUN 13 06/02/2011    NA 144 06/02/2011    K 4.1 06/02/2011    CL 104 06/02/2011    CO2 32 06/02/2011    ALT 36 06/02/2011    AST 39* 06/02/2011    ALKPHOS 65 06/02/2011    BILITOT 0.40 06/02/2011        Assessment  Hx chronic sinusitis, no infection noted now.  LPR, hoarseness.  Huntington's chorea.  Allergic rhinitis. Asthma.  Etiology of rt max pain not clear.      Plan  Cont current meds, follow up with her neurologist, consider more aggressive reflux management.       Medical Decision Making:  The following items were considered in medical decision making:  Review/order radiology tests  Rev iew old records}  CT from Dec 3 demonstr areas of mucosal thickening with post surgical changes (incl small septal perf), but no active infection.

## 2011-06-08 NOTE — Unmapped (Signed)
Procedure Note    Pre-operative Diagnosis: chronic sinusitis, hoarseness    Post-operative Diagnosis: same    Anesthesia: Lidocaine 4% and Neo-Synephrine 1/2%    Endoscopy Type:  Flexible Laryngoscopy    Procedure Details:    The patient was placed in the sitting position.  After topical anesthesia and decongestion, the 4 mm laryngoscope was passed.  The nasal cavities, nasopharynx, oropharynx, hypopharynx, and larynx were all examined.  Vocal cords were examined during respiration and phonation.  The following findings were noted:    Findings: Postop changes, no inflammation or infection.  Significant post cricoid redness with inflammation, edema both vocal cords.    Condition:  Stable.  Patient tolerated procedure well.    Complications:  None

## 2011-06-15 ENCOUNTER — Ambulatory Visit: Payer: PRIVATE HEALTH INSURANCE | Attending: Neurology

## 2011-06-16 ENCOUNTER — Ambulatory Visit: Admit: 2011-06-16 | Payer: PRIVATE HEALTH INSURANCE

## 2011-06-16 DIAGNOSIS — J45909 Unspecified asthma, uncomplicated: Secondary | ICD-10-CM

## 2011-06-16 MED ORDER — fluticasone (FLONASE) 50 mcg/actuation nasal spray
50 | Freq: Every day | NASAL | Status: AC
Start: 2011-06-16 — End: 2011-08-27

## 2011-06-16 NOTE — Unmapped (Signed)
Renew flonase

## 2011-06-16 NOTE — Unmapped (Signed)
Clinically stable  Renew meds

## 2011-06-16 NOTE — Unmapped (Signed)
Subjective  HPI:   Patient ID: Brenda [name] is a 55 y.o. female.    Chief Complaint:  HPI Comments: Here for recheck  Issues with allergies and asthma  See ann ghory  Recent diagnosis of huntingtons chorea      Asthma  She complains of chest tightness and cough. There is no difficulty breathing or shortness of breath. This is a recurrent problem. The current episode started more than 1 year ago. The problem occurs 2 to 4 times per day. The problem has been gradually worsening. The cough is non-productive. Pertinent negatives include no appetite change, chest pain, postnasal drip or rhinorrhea. Her past medical history is significant for asthma.                   ROS:   Review of Systems   Constitutional: Positive for unexpected weight change. Negative for diaphoresis, activity change, appetite change and fatigue.   HENT: Negative for rhinorrhea and postnasal drip.    Respiratory: Positive for cough. Negative for shortness of breath.    Cardiovascular: Negative for chest pain.   Musculoskeletal: Negative for back pain and arthralgias.   Neurological: Positive for weakness. Negative for numbness.   Psychiatric/Behavioral: Negative for dysphoric mood and decreased concentration.   All other systems reviewed and are negative.           Objective:   Physical Exam   Nursing note and vitals reviewed.  Constitutional: She is oriented to person, place, and time. She appears well-developed and well-nourished. No distress.   HENT:   Head: Normocephalic and atraumatic.   Nose: Nose normal.   Mouth/Throat: No oropharyngeal exudate.   Eyes: EOM are normal. Pupils are equal, round, and reactive to light. No scleral icterus.   Neck: Normal range of motion. Neck supple.   Cardiovascular: Normal rate and regular rhythm.    No murmur heard.  Pulmonary/Chest: Effort normal and breath sounds normal. No respiratory distress. She exhibits no tenderness.   Abdominal: Soft. Bowel sounds are normal. She exhibits no distension.      Musculoskeletal: Normal range of motion. She exhibits no edema.   Neurological: She is alert and oriented to person, place, and time. Coordination abnormal.   Skin: Skin is warm and dry. No erythema.   Psychiatric: She has a normal mood and affect. Her behavior is normal. Thought content normal.             Filed Vitals:    06/16/11 1540   BP: 114/68   Temp: 98 ??F (36.7 ??C)   TempSrc: Oral   Height: 5' 5 (1.651 m)   Weight: 125 lb (56.7 kg)     Body mass index is 20.80 kg/(m^2).  Body surface area is 1.61 meters squared.                Assessment/Plan:     Patient Active Problem List   Diagnoses   ??? Huntington's chorea   ??? Asthma   ??? Esophageal reflux   ??? Osteopenia   ??? Fatigue   ??? Chronic sinusitis

## 2011-06-19 ENCOUNTER — Inpatient Hospital Stay
Admit: 2011-06-19 | Discharge: 2011-06-19 | Disposition: A | Discharge status: Home / Self Care | Attending: Emergency Medicine

## 2011-06-19 LAB — COMPREHENSIVE METABOLIC PANEL
ALT: 59 U/L — ABNORMAL HIGH (ref 10–40)
AST: 53 U/L — ABNORMAL HIGH (ref 15–37)
Albumin: 4.6 gm/dl (ref 3.4–5.0)
Alkaline Phosphatase: 82 U/L (ref 45–129)
BUN: 9 mg/dl (ref 7–18)
CO2: 29 mEq/L (ref 21–32)
Calcium: 9.4 mg/dl (ref 8.3–10.6)
Chloride: 102 mEq/L (ref 99–110)
Creatinine: 0.9 mg/dl (ref 0.6–1.1)
GFR Est, African/Amer: >60
GFR, Estimated: >60 (ref >60)
Glucose: 100 mg/dl — ABNORMAL HIGH (ref 70–99)
Potassium: 4 mEq/L (ref 3.5–5.1)
Sodium: 139 mEq/L (ref 136–145)
Total Bilirubin: 0.5 mg/dl (ref 0.0–1.0)
Total Protein: 7.3 gm/dl (ref 6.4–8.2)

## 2011-06-19 LAB — CBC WITH AUTO DIFFERENTIAL
Basophils %: 0.4 % (ref 0.0–2.0)
Basophils Absolute: 0 10*3 (ref 0.0–0.2)
Eosinophils %: 3.5 % (ref 0.0–5.0)
Eosinophils Absolute: 0.2 10*3 (ref 0.0–0.6)
Granulocyte Absolute Count: 4.4 10*3 (ref 1.7–7.7)
Hematocrit: 38.5 % (ref 36.0–48.0)
Hemoglobin: 13.1 gm/dl (ref 12.0–16.0)
Lymphocytes %: 8.2 % — ABNORMAL LOW (ref 25.0–40.0)
Lymphocytes Absolute: 0.5 10*3 — ABNORMAL LOW (ref 1.0–5.1)
MCH: 31 pg (ref 26–34)
MCHC: 34 gm/dl (ref 31–36)
MCV: 91.2 fl (ref 80–100)
MPV: 9.8 fl (ref 5.0–10.5)
Monocytes %: 8.3 % (ref 0.0–12.0)
Monocytes Absolute: 0.5 10*3 (ref 0.0–1.3)
Platelets: 142 10*3 (ref 135–450)
RBC: 4.23 10*6 (ref 4.0–5.2)
RDW: 13 % (ref 11.5–14.5)
Segs Relative: 79.6 % — ABNORMAL HIGH (ref 42.0–63.0)
WBC: 5.5 10*3 (ref 4.0–11.0)

## 2011-06-19 LAB — INFLUENZA A ANTIGEN SCREEN: Rapid Influenza A Ag: POSITIVE

## 2011-06-19 LAB — INFLUENZA B ANTIGEN SCREEN: Rapid Influenza B Ag: NEGATIVE

## 2011-06-19 MED ORDER — GUAIFENESIN-CODEINE 100-10 MG/5ML PO SYRP
100-10 MG/5ML | Freq: Three times a day (TID) | ORAL | Status: AC | PRN
Start: 2011-06-19 — End: 2011-06-26

## 2011-06-19 MED ORDER — PROMETHAZINE HCL 25 MG PO TABS
25 MG | ORAL_TABLET | Freq: Four times a day (QID) | ORAL | Status: AC | PRN
Start: 2011-06-19 — End: 2011-06-26

## 2011-06-19 MED ORDER — OSELTAMIVIR PHOSPHATE 75 MG PO CAPS
75 MG | ORAL_CAPSULE | Freq: Two times a day (BID) | ORAL | Status: AC
Start: 2011-06-19 — End: 2011-06-24

## 2011-06-19 MED ADMIN — ibuprofen (ADVIL;MOTRIN) tablet 800 mg: ORAL | @ 13:00:00 | NDC 62584074811

## 2011-06-19 MED ADMIN — acetaminophen (TYLENOL) tablet 1,000 mg: ORAL | @ 13:00:00 | NDC 50580045103

## 2011-06-19 MED FILL — IBUPROFEN 800 MG PO TABS: 800 MG | ORAL | Qty: 1

## 2011-06-19 MED FILL — ACETAMINOPHEN 500 MG PO TABS: 500 MG | ORAL | Qty: 2

## 2011-06-19 NOTE — ED Notes (Signed)
RETURNS FROM X-RAY VIA STRETCHER.    Bing Neighbors, RN  06/19/11 [phone]

## 2011-06-19 NOTE — ED Notes (Signed)
LEFT ER VIA STRETCHER TO X-RAY.      Bing Neighbors, RN  06/19/11 [phone]

## 2011-06-19 NOTE — ED Provider Notes (Signed)
Chief Complaint  Sinusitis      History of Present Illness  Danielle Lawrence is a 55 y.o. female who presents to the ED for cough and sore throat.  Patient reports that she's been treated for the last month to month and a half by her physician for sinusitis.  She is primarily been on atomized Levaquin and budesonide.  She states over the last few days she's developed muscle aches, fever, sore throat, cough and face pain.  Today she is feeling much worse with myalgias and arthralgias so she came to the ER.  She did not get a flu shot this year.  She denies any chest pain or shortness of breath.    10 systems reviewed, pertinent positives per HPI otherwise noted to be negative      I have reviewed the following from the nursing documentation:      Prior to Admission medications    Medication Sig Start Date End Date Taking? Authorizing Provider   clonazePAM (KLONOPIN) 1 MG tablet Take 1 mg by mouth 4 times daily.     Yes Historical Provider, MD   Omega-3 Fatty Acids (FISH OIL) 1000 MG CAPS Take 1,000 mg by mouth daily.     Yes Historical Provider, MD   Coenzyme Q10 200 MG CAPS Take 800 mg by mouth 2 times daily.     Yes Historical Provider, MD   multivitamin [facility]) per tablet Take 1 tablet by mouth daily.     Yes Historical Provider, MD   Calcium Carbonate-Vitamin D (CALCIUM + D) 600-200 MG-UNIT TABS Take 1 tablet by mouth daily.     Yes Historical Provider, MD   Ciclesonide (ALVESCO) 160 MCG/ACT AERS Inhale 1 puff into the lungs every 12 hours.   Yes Historical Provider, MD   loratadine (CLARITIN) 10 MG tablet Take 10 mg by mouth daily.   Yes Historical Provider, MD   ibuprofen (ADVIL;MOTRIN) 600 MG tablet Take 600 mg by mouth every 6 hours as needed.   Yes Historical Provider, MD       Allergies as of 06/19/2011 - Review Complete 06/19/2011   Allergen Reaction Noted   . Zithromax (azithromycin dihydrate) Anaphylaxis and Other (See Comments) 04/15/2011   . Bactrim Hives 04/13/2011   . Erythromycin Diarrhea 09/02/2009        Past Medical History   Diagnosis Date   . Seasonal allergies    . GERD (gastroesophageal reflux disease)    . Osteopenia    . Anxiety    . Asthma    . Huntington's chorea         Surgical History:   Past Surgical History   Procedure Date   . Sinus surgery 2010     deviated septum   . Tonsillectomy and adenoidectomy 1968   . Wisdom tooth extraction 1978        Family History:    Family History   Problem Relation Age of Onset   . Alzheimer's Disease Father    . Anxiety Disorder Sister        History     Social History   . Marital Status: Married     Spouse Name: N/A     Number of Children: N/A   . Years of Education: N/A     Occupational History   . Not on file.     Social History Main Topics   . Smoking status: Never Smoker    . Smokeless tobacco: Never Used   . Alcohol Use:  Not on file   . Drug Use: No   . Sexually Active: Yes -- Female partner(s)     Other Topics Concern   . Not on file     Social History Narrative   . No narrative on file       Nursing notes reviewed.    ED Triage Vitals   Enc Vitals Group      BP 06/19/11 0720 115/73 mmHg      Pulse 06/19/11 0720 103       Resp 06/19/11 0720 14       Temp 06/19/11 0720 101.8 F (38.8 C)      Temp src --       SpO2 06/19/11 0724 96 %      Weight 06/19/11 0720 125 lb (56.7 kg)      Height 06/19/11 0720 5\' 5"  (1.651 m)      Head Cir --       Peak Flow --       Pain Score --       Pain Loc --       Pain Edu? --       Excl. in GC? --        GENERAL:  Awake, alert. Well developed, well nourished with no apparent distress.   HENT:  Normocephalic, Atraumatic, moist mucous membranes.  EYES:  PERRL, Conjunctiva normal, EOM normal.  NECK:  ROM normal, Supple, no JVD.   CHEST:  Tachycardic and regular, chest wall non-tender.   LUNGS:  CTA, no respiratory distress.    ABDOMEN:  Soft, non-tender, non-distended, normal BS. No CVA TTP.  MUSCULOSKELETAL:  Normal ROM, no edema, no tenderness, no deformity.  BACK:  No tenderness.   SKIN: Febrile, dry and intact.   NEUROLOGIC:  Normal mental status. Moving all extremities to command.       RADIOLOGY  X-RAYS:  I have reviewed radiologic plain film image(s).  ALL OTHER NON-PLAIN FILM IMAGES SUCH AS CT, ULTRASOUND AND MRI HAVE BEEN READ BY THE RADIOLOGIST.  XR CHEST STANDARD TWO VW    I reviewed the chest xray as the primary interpreter and do not see any acute disease.             ED COURSE/MDM    Results for orders placed during the hospital encounter of 06/19/11   CBC WITH AUTO DIFFERENTIAL       Component Value Range    WBC 5.5  4.0 - 11.0 (X 1000)    RBC 4.23  4.0 - 5.2 (X(10)6)    Hemoglobin 13.1  12.0 - 16.0 (gm/dl)    Hematocrit 40.9  81.1 - 48.0 (%)    MCV 91.2  80 - 100 (fl)    MCH 31.0  26 - 34 (pg)    MCHC 34.0  31 - 36 (gm/dl)    RDW 91.4  78.2 - 95.6 (%)    Platelets 142  135 - 450 (X(10)3)    MPV 9.8  5.0 - 10.5 (fl)    Segs Relative 79.6 (*) 42.0 - 63.0 (%)    Lymphocytes Relative 8.2 (*) 25.0 - 40.0 (%)    Monocytes Relative 8.3  0.0 - 12.0 (%)    Eosinophils 3.5  0.0 - 5.0 (%)    Basophils Relative 0.4  0.0 - 2.0 (%)    GRANULOCYTE ABSOLUTE COUNT 4.4  1.7 - 7.7 (X(10)3)    Lymphocytes Absolute 0.5 (*) 1.0 - 5.1 (X(10)3)    Monocytes Absolute 0.5  0.0 - 1.3 (X(10)3)    Eosinophils Absolute 0.2  0.0 - 0.6 (X(10)3)    Basophils Absolute 0.0  0.0 - 0.2 (X(10)3)    Differential Type Auto     COMPREHENSIVE METABOLIC PANEL       Component Value Range    Sodium 139  136 - 145 (mEq/L)    Potassium 4.0  3.5 - 5.1 (mEq/L)    Chloride 102  99 - 110 (mEq/L)    CO2 29  21 - 32 (mEq/L)    Glucose 100 (*) 70 - 99 (mg/dl)    BUN 9  7 - 18 (mg/dl)    Creatinine, Ser 0.9  0.6 - 1.1 (mg/dl)    Total Protein 7.3  6.4 - 8.2 (gm/dl)    Alb 4.6  3.4 - 5.0 (gm/dl)    Total Bilirubin 1.61  0.0 - 1.0 (mg/dl)    Alkaline Phosphatase 82  45 - 129 (U/L)    AST 53 (*) 15 - 37 (U/L)    ALT 59 (*) 10 - 40 (U/L)    Calcium 9.4  8.3 - 10.6 (mg/dl)    GFR, Estimated >09  >60 mL/min/1.13m2     GFR Est, African/Amer >60  SEE BELOW    INFLUENZA B ANTIGEN SCREEN        Component Value Range    Rapid Influenza B Ag Presumptive Negative     INFLUENZA A ANTIGEN SCREEN       Component Value Range    Rapid Influenza A Ag POSITIVE           I estimate there is LOW risk for EPIGLOTTITIS, PNEUMONIA, MENINGITIS, OR URINARY TRACT INFECTION, thus I consider the discharge disposition reasonable. Also, there is no evidence or peritonitis, sepsis, or toxicity. [name] and I have discussed the diagnosis and risks, and we agree with discharging home to follow-up with their primary doctor. We also discussed returning to the Emergency Department immediately if new or worsening symptoms occur. We have discussed the symptoms which are most concerning (e.g., changing or worsening pain, trouble swallowing or breating, neck stiffness, fever) that necessitate immediate return.    Clinical Impression    1. Influenza A        Discharge Vital Signs:  Blood pressure 104/59, pulse 89, temperature 100.7 F (38.2 C), temperature source Oral, resp. rate 20, height 5\' 5"  (1.651 m), weight 125 lb (56.7 kg), SpO2 96.00%.          Patient was given scripts for the following medications. I counseled patient how to take these medications.  New Prescriptions    GUAIFENESIN-CODEINE (GUAIATUSSIN AC) 100-10 MG/5ML SYRUP    Take 10 mLs by mouth 3 times daily as needed for Cough for 7 days.    OSELTAMIVIR (TAMIFLU) 75 MG CAPSULE    Take 1 capsule by mouth 2 times daily for 5 days.    PROMETHAZINE (PHENERGAN) 25 MG TABLET    Take 1 tablet by mouth every 6 hours as needed for Nausea for 7 days.       Disposition  Pt is in good condition upon Discharge to home.      This chart was generated using the Dragon dictation system.  I created this record but it may contain dictation errors given the limitations of this technology.          Baldwin Crown, MD  06/19/11 [phone]

## 2011-06-19 NOTE — Discharge Instructions (Signed)
Influenza, Adult  Influenza ("the flu") is a viral infection of the respiratory tract. It causes chills, fever, cough, headache, body aches, and sore throat. Influenza in general will make you feel sicker than when you have a common cold. Symptoms of the illness typically last a few days. Cough and fatigue may continue for as long as 7 to 10 days. Influenza is highly contagious. It spreads easily to others in the droplets from coughs and sneezes. People frequently become infected by touching something that was recently contaminated with the virus and then touch their mouth, nose or eyes. This infection is caused by a virus. Symptoms will not be reduced or improved by taking an antibiotic. Antibiotics are medications that kill bacteria, not viruses.  DIAGNOSIS  Diagnosis of influenza is often made based on the history and physical examination as well as the presence of influenza reports occurring in your community. Testing can be done if the diagnosis is not certain.  TREATMENT  Since influenza is caused by a virus, antibiotics are not helpful. Your caregiver may prescribe antiviral medicines to shorten the illness and lessen the severity. Your caregiver may also recommend influenza vaccination and/or antiviral medicines for your family members in order to prevent the spread of influenza to them.  HOME CARE INSTRUCTIONS   DO NOT GIVE ASPIRIN TO PERSONS WITH INFLUENZA WHO ARE UNDER 13 YEARS OF AGE. This could lead to brain and liver damage (Reye's syndrome). Read the label on over-the-counter medicines.    Stay home from work or school if at all possible until most of your symptoms are gone.    Only take over-the-counter or prescription medicines for pain, discomfort, or fever as directed by your caregiver.    Use a cool mist humidifier to increase air moisture. This will make breathing easier.    Rest until your temperature is nearly normal: 98.6 F (37.0 C). This usually takes 3 to 4 days. Be sure you get  plenty of rest.    Drink at least eight, eight-ounce glasses of fluids per day. Fluids include water, juice, broth, gelatin, or lemonade.    Cover your mouth and nose when coughing or sneezing and wash your hands often to prevent the spread of this virus to other persons.   PREVENTION  Annual influenza vaccination (flu shots) is the best way to avoid getting influenza. An annual flu shot is now routinely recommended for all adults in the [place] IF:   You develop an oral temperature above 102 F (38.9 C), or as your caregiver suggests.    The fever lasts for more than 3 days.    You develop shortness of breath while resting.    You have a deep cough with production of mucous or chest pain.    You develop nausea (feeling sick to your stomach), vomiting, or diarrhea.   SEEK IMMEDIATE MEDICAL CARE IF:   You have difficulty breathing, become short of breath, or your skin or nails turn bluish.    You develop severe neck pain or stiffness.    You develop a severe headache, facial pain or earache.    You develop a high fever that is not controlled with medication or that lasts more than 3 days.    You develop nausea or vomiting that cannot be controlled.   Document Released: 10/10/2002 Document Re-Released: 09/15/2009  [facility] Patient Information 2012 [place].

## 2011-06-19 NOTE — Unmapped (Signed)
_______________________________________________________________________________________________  Brenda [name] (MR # [id number])                    Encounter Date: 06/19/2011                      _______________________________________________________________________________________________      Your patient was seen at a [facility]. Please go to                          [email or web address] to view information filed to your patient's      chart in Epic. If you need to view your patient's results prior to gaining access to Epic        CareLink, please contact the [facility] where your patient was seen.                     _______________________________________________________________________________________________                                                                                                   Brenda [name] [phone] (Acct:N/A)  (55 y.o. F)   PCP: Adin Hector                                                                                                                                ED Arrival Information                                                                           _______________________________________________________________________________________________      Expect  Arrival        Acui  Means of       Escorted  Service        Admission   Arrival          ed                     ty    Arrival        By                       Type        Complaint    _______________________________________________________________________________________________      -  06/19/2011     Urge  Personal       -         Emergency      Emergency   HEADACHE,FE              7:12 AM        nt    Transport                Medicine                   [place]          _______________________________________________________________________________________________                                                                                                      Chief Complaint                                                                                  _______________________________________________________________________________________________      Sinusitis                             worsening pain - Cough and sore throat -- headache     _______________________________________________________________________________________________                                                                                                   ED Vitals                                                                                        _______________________________________________________________________________________________     Date and       Temp        Pulse       Resp        BP          SpO2        Weight   Who     Time  _______________________________________________________________________________________________     06/19/11       100.7       89          20          104/59      96          --       BS      0858           (38.2)                                                                       06/19/11       --          --          --          --          96          --       RG      0724                                                                                        06/19/11       101.8       103         14          115/73      --          125 lb   RG      0720           (38.8)                                                      (56.7                                                                                       kg)           _______________________________________________________________________________________________  Allergies (verified on: 06/19/11)                                                                 _______________________________________________________________________________________________      Agent                                 Severity            Comments                            _______________________________________________________________________________________________      Brenda [name]                             High                abdominal pain                          BACTRIM                                                                                           ERYTHROMYCIN                                                                                  _______________________________________________________________________________________________                                                                                                   Medical History                                                                                  _______________________________________________________________________________________________  ----PAGE----      Past Medical History  Date                Comments                            _______________________________________________________________________________________________      Seasonal allergies [477.9U]                                                                       GERD (gastroesophageal reflux                                                                     disease) [530.81S]                                                                                Osteopenia [733.90X]                                                                              Anxiety [300.00E]                                                                                 Asthma [493.90AE]                                                                                 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]                                                                    _______________________________________________________________________________________________  Surgical History                                                                                 _______________________________________________________________________________________________      Past Surgical History                 Last Occurrence     Comments                            _______________________________________________________________________________________________      SINUS SURGERY [VWU981]                2010                deviated septum                         Tonsillectomy and adenoidectomy       1968                                                        [SHX28]                                                                                           Wisdom tooth extraction [SHX21]       1978                                                    _______________________________________________________________________________________________                                                                                                   Obstetric History  _______________________________________________________________________________________________      The patient has not been asked about pregnancy.                                              _______________________________________________________________________________________________                                                                                                   ED Provider Notes                                                                                _______________________________________________________________________________________________      Baldwin Crown, MD         06/19/2011  9:13 AM                                                     Chief Complaint       Sinusitis       History of Present Illness       Brenda [name]              is a        55 y.o.               female        who presents to the ED for cough and sore        throat.  Patient reports that she's been treated for the last month to month and a half by       her physician for sinusitis.  She is primarily been on atomized Levaquin and budesonide.         She states over the last few days she's developed muscle aches, fever, sore throat, cough        and face pain.  Today she is feeling much worse with myalgias and arthralgias so she came to      the ER.  She did not get a flu shot this year.  She denies any chest pain or shortness of        breath.   10 systems reviewed, pertinent positives per HPI otherwise noted to be negative        I have reviewed the following from the nursing documentation:     Prior to Admission             medications  Medication   Sig   Start Date   End Date   Taking*   Authorizing Provider                       clonazePAM (KLONOPIN) 1 MG tablet   Take 1 mg by mouth 4 times daily.     Yes                Historical Provider, MD                                                                                    Omega-3 Fatty Acids (FISH OIL) 1000 MG CAPS   Take 1,000 mg by mouth daily.     Yes        Historical Provider, MD                                                                              Coenzyme Q10 200 MG CAPS   Take 800 mg by mouth 2 times daily.     Yes   Historical         Provider, MD                                                                                           multivitamin [facility]) per tablet   Take 1 tablet by mouth daily.     Yes                 Historical Provider, MD                                                                                      Calcium Carbonate-Vitamin D (CALCIUM + D) 600-200 MG-UNIT TABS   Take 1 tablet by       mouth daily.     Yes   Historical Provider, MD  Ciclesonide (ALVESCO) 160 MCG/ACT AERS   Inhale 1 puff into the lungs every 12 hours.        Yes   Historical Provider, MD                                                                      loratadine (CLARITIN) 10 MG tablet   Take 10 mg by mouth daily.   Yes   Historical            Provider, MD                                                                                            ibuprofen (ADVIL;MOTRIN) 600 MG tablet   Take 600 mg by mouth every 6 hours as needed.        Yes   Historical Provider, MD                                                                         Allergies as of 06/19/2011 - Review Complete 06/19/2011                                     Allergen   Reaction   Noted                                                                *        Zithromax (azithromycin dihydrate)   Anaphylaxis and Other (See Comments)   04/15/2011                                                                                                 *         Bactrim   Hives   04/13/2011                                                           *  Erythromycin   Diarrhea   09/02/2009                                                                Past Medical History                                                                           Diagnosis   Date                                                                           *        Seasonal allergies                                                                     *        GERD (gastroesophageal reflux disease)                                                 *         Osteopenia                                                                             *         Anxiety                                                                                *         Asthma                                                                                 *  Huntington's chorea                                                                                 Surgical History   :        Past Surgical History                                              Procedure   Date                                                                           *        Sinus surgery   2010                                                                           deviated septum                                                                            *        Tonsillectomy and adenoidectomy   1968                                                 *        Wisdom tooth extraction   1978                                                                      Family History   :         Family History                                          Problem        Relation   Age of Onset                                                          *  Alzheimer's Disease   Father                                                           *         Anxiety Disorder   Sister                                                                           History                                                                            Social      History                                                                             *            Marital Status:   Married                                                                        Spouse Name:   N/A                                                                               Number of Children:   N/A                                                                  *        Years of Education:   N/A  Occupational History                                                                       *        Not on file.                                                                                   Social History Main Topics                                                                 *         Smoking status:   Never Smoker                                                         *        Smokeless tobacco:   Never Used                                                        *         Alcohol Use:   Not on file                                                             *         Drug Use:   No                                                                         *         Sexually Active:   Yes -- Female partner(s)                                              Other  Topics   Concern                                                                     *   Not      on file                                                                            Social        History Narrative                                                                   *   No       narrative on file                                                                                Nursing notes reviewed.   ED Triage Vitals                                             Enc       Vitals Group                                                                              BP        06/19/11 0720   115/73 mmHg                                                                    Pulse   06/19/11 0720   103  Resp   06/19/11 0720   14                                                                        Temp   06/19/11 0720   101.8 ??????F (38.8 ??????C)                                                        Temp src   --                                                                                    SpO2   06/19/11 0724   96 %                                                                      Weight   06/19/11 0720   125 lb (56.7 kg)                                                        Height   06/19/11 0720   5' 5 (1.651 m)                                                         Head Cir   --                                                                                     Peak Flow   --  Pain Score   --                                                                                  Pain Loc   --                                                                                    Pain Edu*   --                                                                                   Excl. in [facility]*   --                                                                                GENERAL:  Awake, alert. Well developed, well nourished with no apparent distress.    HENT:       Normocephalic, Atraumatic, moist mucous membranes.   EYES:  PERRL, Conjunctiva normal, EOM       normal.   NECK:  ROM normal, Supple, no JVD.    CHEST:  Tachycardic and regular, chest wall      non-tender.    LUNGS:  CTA, no respiratory distress.     ABDOMEN:  Soft, non-tender,             non-distended, normal BS. No CVA TTP.   MUSCULOSKELETAL:  Normal ROM, no edema, no               tenderness, no deformity.   BACK:  No tenderness.    SKIN: Febrile, dry and intact.              NEUROLOGIC: Normal mental status. Moving all extremities to command.    RADIOLOGY   X-RAYS        :     I have reviewed radiologic plain film image(s).  ALL OTHER NON-PLAIN FILM IMAGES SUCH      AS CT, ULTRASOUND AND MRI HAVE BEEN READ BY THE RADIOLOGIST.   XR CHEST STANDARD TWO VW            I reviewed the chest xray as the primary interpreter and do not see any acute disease.  ED COURSE/MDM       Results for orders placed during the hospital             encounter of 06/19/11                                                                                          CBC WITH AUTO DIFFERENTIAL                                                                          Component   Value   Range                                                                   WBC   5.5    4.0 - 11.0 (X 1000)                                                                 RBC   4.23    4.0 - 5.2 (X(10)6)                                                                Hemoglobin   13.1    12.0 - 16.0 (gm/dl)                                                        Hematocrit   38.5    36.0 - 48.0 (%)                                                            MCV   91.2    80 - 100 (fl)  MCH   31.0    26 - 34 (pg)                                                                       MCHC   34.0    31 - 36 (gm/dl)                                                                   RDW   13.0    11.5 - 14.5 (%)                                                                    Platelets   142    135 - 450 (X(10)3)                                                            MPV   9.8    5.0 - 10.5 (fl)                                                                     Segs Relative   79.6 (*)   42.0 - 63.0 (%)                                                       Lymphocytes Relative   8.2 (*)   25.0 - 40.0 (%)                                                 Monocytes Relative   8.3    0.0 - 12.0 (%)                                                       Eosinophils   3.5    0.0 - 5.0 (%)  Basophils Relative   0.4    0.0 - 2.0 (%)                                                        GRANULOCYTE ABSOLUTE COUNT   4.4    1.7 - 7.7 (X(10)3)                                           Lymphocytes Absolute   0.5 (*)   1.0 - 5.1 (X(10)3)                                              Monocytes Absolute   0.5    0.0 - 1.3 (X(10)3)                                                   Eosinophils Absolute   0.2    0.0 - 0.6 (X(10)3)                                                 Basophils Absolute   0.0    0.0 - 0.2 (X(10)3)                                                   Differential Type   Auto                                                                          COMPREHENSIVE METABOLIC PANEL                                                                       Component   Value   Range                                                                      Sodium   139    136 - 145 (  mEq/L)                                                                Potassium   4.0    3.5 - 5.1 (mEq/L)                                                             Chloride   102    99 - 110 (mEq/L)                                                               CO2   29    21 - 32 (mEq/L)                                                                      Glucose   100 (*)   70 - 99 (mg/dl)                                                              BUN   9    7 - 18 (mg/dl)                                                                        Creatinine, Ser   0.9    0.6 - 1.1 (mg/dl)                                                       Total Protein   7.3    6.4 - 8.2 (gm/dl)                                                         Alb   4.6    3.4 - 5.0 (gm/dl)  Total Bilirubin   0.50    0.0 - 1.0 (mg/dl)                                                      Alkaline Phosphatase   82    45 - 129 (U/L)                                                      AST   53 (*)   15 - 37 (U/L)                                                                     ALT   59 (*)   10 - 40 (U/L)                                                                     Calcium   9.4    8.3 - 10.6 (mg/dl)                                                              GFR, Estimated   >60    >60 mL/min/1.51m2                                                        GFR Est, African/Amer   >60    SEE BELOW                                                         INFLUENZA B ANTIGEN SCREEN                                                                          Component   Value   Range  Rapid  Influenza B Ag   Presumptive Negative                                                      INFLUENZA A ANTIGEN SCREEN                                                                          Component   Value   Range                                                                      Rapid Influenza A Ag   POSITIVE                                                                     I estimate there is LOW risk for EPIGLOTTITIS, PNEUMONIA, MENINGITIS, OR URINARY TRACT         INFECTION, thus I consider the discharge disposition reasonable. Also, there is no evidence      or peritonitis, sepsis, or toxicity.        Brenda [name]        and I have discussed the          diagnosis and risks, and we agree with discharging home to follow-up with their primary          doctor. We also discussed returning to the Emergency Department immediately if new or            worsening symptoms occur. We have discussed the symptoms which are most concerning (e.g.,        changing or worsening pain, trouble swallowing or breating, neck stiffness, fever) that          necessitate immediate return.   Clinical Impression       1.   Influenza A                                        Discharge Vital Signs:         Blood pressure 104/59, pulse 89, temperature      100.7 ??????F (38.2 ??????C), temperature source Oral, resp. rate 20, height 5' 5 (1.651 m), weight       125 lb (56.7 kg), SpO2 96.00%.       Patient was given scripts for the following                 medications. I counseled patient how to take these medications.  New Prescriptions                              GUAIFENESIN-CODEINE (GUAIATUSSIN AC) 100-10 MG/5ML SYRUP      Take 10 mLs      by mouth 3 times daily as needed for Cough for 7 days.                                                           OSELTAMIVIR (TAMIFLU) 75 MG CAPSULE      Take 1 capsule by mouth 2 times          daily for 5 days.                                                                                                  PROMETHAZINE (PHENERGAN) 25 MG TABLET      Take 1 tablet by mouth every 6        hours as needed for Nausea for 7 days.                                                                               Disposition   Pt is in good condition upon Discharge to home.   This          chart was generated using the Dragon dictation system.  I created this record but it may         contain dictation errors given the limitations of this technology.   Baldwin Crown, MD         06/19/11 [id number]                                                                                                       Original note by Baldwin Crown, MD at 06/19/2011  8:46 AM                                                                                                                  ----  PAGE----  _______________________________________________________________________________________________                                                                                                   ED Medication Orders                                                                             _______________________________________________________________________________________________      Start Status Ordering Provider                                                                   06/19/11 0800   acetaminophen (TYLENOL) tablet 1,000 mg      ONCE                                Last MAR action:   Given - by [facility], BRIAN on 06/19/11 at 0801 ROBERTS, MATTHEW                06/19/11 0800   ibuprofen (ADVIL;MOTRIN) tablet 800 mg      ONCE                                 Last MAR action:   Given - by Bing Neighbors on 06/19/11 at 0802 ROBERTS, MATTHEW            _______________________________________________________________________________________________                                                                                                   Lab Results                                                                                       _______________________________________________________________________________________________  _______________________________________________________________________________________________      Influenza B antigen screen (Final result)                          Component (Lab Inquiry)    _______________________________________________________________________________________________      Result Time     Rapid Influenza B Ag                                                         _______________________________________________________________________________________________      06/19/11 7106   Presumptive Negative                                                         _______________________________________________________________________________________________                                                                                                   _______________________________________________________________________________________________      Influenza a antigen screen (Final result)                          Component (Lab Inquiry)    _______________________________________________________________________________________________      Result Time     Rapid Influenza A Ag                                                         _______________________________________________________________________________________________      06/19/11 2694   POSITIVE                                                                     _______________________________________________________________________________________________                                                                                                   _______________________________________________________________________________________________      Comprehensive metabolic panel (Final result)   Abnormal  Component (Lab Inquiry)     _______________________________________________________________________________________________     Result Time      NA      K       CL      CO2     GLUCOS  BUN     Creati  Total   Alb     BILI                                                      E               nine,   Protei          TOTA                                                                      Ser     n               L     _______________________________________________________________________________________________     06/19/11 0846    139     4.0     102     29      100     9       0.9     7.3     4.6     0.50                                                      (H)                                              Previous Results                                                                                  06/02/11 1131    144     4.1     104     32      83      13      0.8     8.2     5.1     0.40                                                                                      (  H)              06/03/10 1629    140     3.7     104     30      72      13      0.7     7.6     4.7     0.60     06/01/10 2014    141     4.4     104     30      87      12      0.7                              10/26/08 1648     139     4.7     104     29      81      12      0.7     6.8     4.2     0.70                                                                                                    _______________________________________________________________________________________________      Result Time    ALK PHOS     AST          ALT          CALCIUM      GFR,         GFR Est,                                                                           Estimated    African/Am                                                                                      er          ----PAGE----  _______________________________________________________________________________________________      06/19/11 0846  82           53 (H)       59 (H)       9.4          >60           >60 >60  mL/min/1.7                                                                                      76m2 EGFR,                                                                                       calc. for                                                                                       ages 34 &                                                                                       older using                                                                                      the MDRD                                                                                        formula                                                                                         (  not                                                                                            corrected                                                                                       for                                                                                             weight),is                                                                                      valid for                                                                                       stable                                                                                          renal

## 2011-07-06 HISTORY — PX: PLANTAR FASCIECTOMY: SUR600

## 2011-07-07 NOTE — Telephone Encounter (Signed)
Please have someone review

## 2011-07-08 NOTE — Telephone Encounter (Signed)
Patients husband states she did not request this so I will refuse iT.

## 2011-07-08 NOTE — Telephone Encounter (Signed)
Danielle Lawrence: Call patient tell ;this prescription is not a routine 4 refilling .need to know why she wishes this prescription? Having problems?  Dr. Morrie Sheldon.

## 2011-07-08 NOTE — Telephone Encounter (Signed)
Why is pt requesting this antibiotic refill

## 2011-07-08 NOTE — Telephone Encounter (Signed)
Addended by: Bo Merino on: 07/08/2011 05:18 PM     Modules accepted: Orders

## 2011-07-08 NOTE — Telephone Encounter (Signed)
We will not refill an antibiotic unless seen in the office. Please inform the patient. I spoke personally to Danielle Lawrence about this today.

## 2011-07-08 NOTE — Telephone Encounter (Signed)
This was a pharmacy generated refill ([facility])  We need more information as to why it is being requested. Will call the patient as ask why.

## 2011-07-09 NOTE — Unmapped (Signed)
She has not had a normal voice in the past year. Is there anyway to help with this or to help her breathing better. She saw the ENT about this and pain in cheek but the ENT suggested she talk to neurologist.

## 2011-07-09 NOTE — Unmapped (Signed)
Fwd to dr Reather Converse for speech therapy

## 2011-07-22 NOTE — Unmapped (Signed)
Will like to send test results of huntingtons diagnosis, will call Nicholos Johns and mary beth to assist with information to send to patients

## 2011-07-22 NOTE — Unmapped (Addendum)
The patient husband is calling for test results of verification of recent diagnosis of huntington's emailed to him at [email or web address]. If we can not do that can we mail them please. He is trying to provide the results to another doctor but the doctor wants to review them before he goes on vacation.     Also his wife should have provided permission for [place] to talk to him. He would like if you called him directly with any questions to [phone]

## 2011-07-27 NOTE — Unmapped (Signed)
Faxed and spoke with husband

## 2011-07-27 NOTE — Unmapped (Signed)
Pt's husband is calling stating he is needing pt's testing results as soon as possible. Pt's husband is asking for them today please. See previous message.

## 2011-07-27 NOTE — Unmapped (Signed)
Wants to know if she can pick up disability papers Friday at her appt with dr.

## 2011-07-27 NOTE — Unmapped (Signed)
yes

## 2011-07-30 ENCOUNTER — Encounter: Payer: PRIVATE HEALTH INSURANCE | Attending: Neurology

## 2011-07-30 ENCOUNTER — Ambulatory Visit: Admit: 2011-07-30 | Discharge: 2011-07-30 | Payer: PRIVATE HEALTH INSURANCE | Attending: Neurology

## 2011-07-30 DIAGNOSIS — G10 Huntington's disease: Secondary | ICD-10-CM

## 2011-07-30 MED ORDER — risperiDONE (RISPERDAL) 0.25 MG tablet
0.25 | ORAL_TABLET | Freq: Every evening | ORAL | Status: AC
Start: 2011-07-30 — End: 2011-08-16

## 2011-07-30 NOTE — Unmapped (Signed)
Subjective:      Patient ID: Brenda [name] is a 56 y.o. female.    HPI   Movement Disorders HPI  She is here for a f/u visit. She has seen Dr. Corinne Ports for psychiatric consultation. She continues to take clonazepam, but the dose is variable. Sometimes she takes 0.25 tab tid, other times she takes less than that. She feels tired and drunk and she may cry if she takes too much. It continues to work well for the chorea and the anxiety. She is taking escitalopram 5 mg every day. She started it 2 weeks ago. She feels that she is breathing better, she is more productive, she is somewhat speedy. She feels nervous, but not depressed or sad. She was very depressed recently before starting the escitalopram. Her husband reports that she has been paranoid at home; she is reacting in a disproportionate manner to minor events at home (like arguments with her children or her husband); she is accusing her husband of things that he has not done. She is disruptive in family interactions and even her friends are concerned about the change in her personality in recent months. Her husband and children are concerned regarding her driving abilities.  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 Racing thoughts are sometimes present but these are helped by the Klonopin.   Has filled out health care power of attorney. Able to drive herself, is careful, sometimes becomes nervous when in an unfamiliar place.   She discontinued sertraline. She did not feel the need to start tetrabenazine, partially because it would be difficult to get. 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 Her initial history follows: she first noticed racy, speedy spells in December 2010. These spells consist of her mind  racing, she starts speaking, her muscles get tight. Initially she thought these spells were due to taking steroids (for asthma). Shortly afterwards she noticed generalized involuntary movements. She describes them as twitches. She started dropping objects, since mid 2011. She denies difficulty walking, but she admits that her balance can be off at times. In November 2011 she was rocking in a chair and she fell backwards with the whole chair. In 2010 she was trying to step over a dog gate and she fell and hit her face; she suffered a left orbital fracture. She has noticed that she is bumping into things while walking. She still walks and plays golf. She denies hallucinations, delusions, lightheadedness, fainting spells, daytime sedation, nausea or vomiting. She denies memory problems. She admits occasional feelings of depression, especially in the winter. She started sertraline in December 2011, up to 150 mg qd; last increased 10 days ago. It helps some, but she is not sure about the details. She sleeps well, she believes the sertraline helps with her sleep. She denies urinary incontinence or constipation. She has anxiety, that occurs in spells.     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    She has past surgical history that includes Sinus surgery and Tonsillectomy.    Her family history includes Alzheimer's disease in her father; Anxiety disorder in her sister; Depression in  her sister; Huntington's disease in her maternal uncle; Huntington's disease (age of onset:60) in her mother; Pulmonary embolism in her mother; and Suicidality in her sister.    She reports that she has never smoked. She has never used smokeless tobacco. She reports that she drinks about .6 ounces of alcohol per week. She reports that she does not use illicit drugs.    Review of Systems: as per HPI    Allergies:   Zithromax; Bactrim; Erythromycin; Erythromycin base; and Sulfa (sulfonamide antibiotics)    Medications:     Outpatient Encounter Prescriptions as of 07/30/2011   Medication Sig Dispense Refill   ??? albuterol (PROAIR HFA) 90 mcg/actuation inhaler Inhale 2 puffs into the lungs every 6 hours as needed.       ??? calcium carbonate-vitamin D3 (CALCIUM+D) 400-133.3 mg-unit Tab Take 1 tablet by mouth 2 times a day.       ??? ciclesonide (ALVESCO) 160 mcg/actuation inhaler Inhale 1 puff into the lungs 2 times a day.        ??? clonazepam (KLONOPIN) 0.5 MG tablet Take 0.25 mg by mouth 4 times a day as needed.        ??? coenzyme Q10 400 mg Cap Take 800 mg by mouth 2 times a day.       ??? diphenhydrAMINE (BENADRYL) 25 mg tablet Take 25 mg by mouth at bedtime as needed.       ??? escitalopram (LEXAPRO) 5 MG tablet Take 5 mg by mouth every other day.        ??? loratadine (CLARITIN) 10 mg tablet Take 10 mg by mouth daily as needed.       ??? MULTIVITAMIN ORAL Take 1 tablet by mouth daily.        ??? omega-3 fatty acids (FISH OIL) 500 mg Cap Take 1 tablet by mouth 2 times a day.       ??? omeprazole (PRILOSEC) 20 MG capsule Take 20 mg by mouth every morning before breakfast.       ??? fluticasone (FLONASE) 50 mcg/actuation nasal spray 1 spray by Nasal route daily.  3 g  3   ??? levalbuterol (XOPENEX) 0.63 mg/3 mL nebulizer solution Inhale 1 ampule by nebulization every 6 hours as needed.            Objective:       Blood pressure 143/68, pulse 81, height 5' 5 (1.651 m), weight 128 lb (58.06 kg).    Neurologic Exam    Physical Exam  UHDRS 1:  Motor Examination  Huntington's Disease Rating Scale  Ocular pursuit, horizontal: 1  Ocular pursuit, vertical: 1  Saccade initiation, horizontal: 0  Saccade initiation, vertical: 0  Saccade velocity, hortizontal : 1  Saccade velocity, vertical: 1  Dysarthria : 1  Tongue Protrusion: 0  Fingers taps, right: 1  Fingers taps, left : 1  Pronate/suppinate hands, right : 1  Pronate/supinate hands, left : 1  Luria (fist-hand-palm test): 1  Rigidity arms, right : 0  Rigidity arms, left: 0  Body Bradykinesia: 1  Maximal dystonia, trunk: 0  Maximal dystonia,  RUE: 1  Maximal dystonia, LUE: 1  Maximal dystonia, RLE: 0  Maximal dystonia, LLE: 0  Maximal chorea, face: 1  Maixmal chorea, BOL: 0  Maximal chorea, trunk: 1   Maximal chorea, RUE: 1  Maximal chorea, LUE: 1  Maximal chorea, RLE: 1  Maximal chorea, LLE: 1  Gait: 1  Tandem Walking: 2  Retropulsion Pull Test: 1  Diagnosis confidence level: 4  UHDRS 1 TOTAL:: 23      Assessment:     1. Huntington's chorea      She has family history of HD, and her history and examination are consistent with a clinical diagnosis of Huntington's disease, progressively worse over time. On exam she has worse chorea and bradykinesia, mild abnormality in tandem walking, motor impersistence. 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 She took sertraline in the past. She is taking variable doses of clonazepam and recently started escitalopram (which helps with depression, but she seems to be hyper). Treatment with a low dose of a neuroleptic, such as risperidone, would be adequate at this time, given the prominent personality change, and psychiatric symptoms (depression, paranoia), associated with HD. I discussed her case on the phone with Dr. Corinne Ports as well, who agreed with the plan. Regular stretching exercises were recommended. I extensively discussed these issues with the patient and her husband, and answered all her questions    Plan:   1.  Continue clonazepam, 0.25 tab tid. Take on a regular schedule.    2. Start risperidone 0.25 mg qhs.  3. Consider treatment with tetrabenazine in the future.   4. Regular exercise program.   5. Continue to follow w/ Psychiatry.   6. Discussed the risks associated with continued driving. I advised her to stop driving.

## 2011-08-06 NOTE — Unmapped (Signed)
Patient left message on voicemail she needs a return call regarding medication.  Left a call back number of [phone]

## 2011-08-06 NOTE — Unmapped (Signed)
Fwd to Dr. Pecina

## 2011-08-06 NOTE — Unmapped (Signed)
Husband would like to speak to Dr Heywood Footman re: a physician on the [facility] dealing with Huntington's disease-

## 2011-08-06 NOTE — Unmapped (Signed)
Double omperazole'  See dr Cassell Smiles

## 2011-08-06 NOTE — Unmapped (Signed)
Patient called stated she's have some reflux of hoarseness, hard time breathing and past history of hiatal hernia unsure if you can treat her or if she should see a specialist. Please call.

## 2011-08-06 NOTE — Unmapped (Signed)
Spoke to patient and Dr. Heywood Footman has talked to her husband.  Will continue to take risperdone 0.25mg  at night.

## 2011-08-06 NOTE — Unmapped (Signed)
Fwd to Dr. Heywood Footman to return call to pt husband

## 2011-08-06 NOTE — Unmapped (Signed)
Just started risperdal last wk and pt starting to have ticking in face again and now having tightness in chest using emergency inhaler more than normal, wants to discuss meds

## 2011-08-09 NOTE — Unmapped (Signed)
Pt is taking risperidone and just has some general questions she is needing to ask.

## 2011-08-09 NOTE — Unmapped (Signed)
Spoke with patient.  Risperidone is helping her, but it is keeping her awake at night, then she is sleepy during the day.  Can patient take risperidone in the morning and try and see how that works?  Please advise.

## 2011-08-10 NOTE — Unmapped (Signed)
Called and informed of directions, pt was interested in possible speech therapy, will consult with Dr.

## 2011-08-16 MED ORDER — risperiDONE (RISPERDAL) 0.25 MG tablet
0.25 | ORAL_TABLET | Freq: Every day | ORAL | Status: AC
Start: 2011-08-16 — End: 2011-08-27

## 2011-08-16 NOTE — Unmapped (Signed)
Pt calling back re: the risperidone - call to advise    Also calling about the study-

## 2011-08-16 NOTE — Unmapped (Signed)
Called and advised, pt still having breathing problems, will fwd to Dr. Reather Converse for advise

## 2011-08-19 NOTE — Unmapped (Signed)
Fwd to Dr. Reather Converse with refill

## 2011-08-19 NOTE — Unmapped (Signed)
The patient would like to know if can increase risperidone a week earlier as she is going on vaction at the end of March and want it to be in her system and used to it so she is not sleeping on her vacation. It is really helping her.    If he is ok with that can we call it in? 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 They are sending her to the doctor that they use.  If she is not at home please try her cell phone.

## 2011-08-26 NOTE — Unmapped (Signed)
Pt's husband is stating he received a call about tomorrow's appt, pt's husband is calling to confirm appt. Is this appt still okay?

## 2011-08-27 ENCOUNTER — Ambulatory Visit: Admit: 2011-08-27 | Discharge: 2011-08-27 | Payer: PRIVATE HEALTH INSURANCE | Attending: Neurology

## 2011-08-27 DIAGNOSIS — G10 Huntington's disease: Secondary | ICD-10-CM

## 2011-08-27 MED ORDER — risperiDONE (RISPERDAL) 0.25 MG tablet
0.25 | ORAL_TABLET | Freq: Two times a day (BID) | ORAL | Status: AC
Start: 2011-08-27 — End: 2011-10-08

## 2011-08-27 NOTE — Unmapped (Signed)
Subjective:      Patient ID: Brenda [name] is a 55 y.o. female.    HPI   She is here for a f/u visit.  At her last visit we started risperidone which has helped her and she is now taking 0.25 mg BID.  She says it has especially helped with her breathing and chest tightness.  She says it also makes her feel content and relaxed and has decreased her chorea.  She continues to take clonazepam 0.25 mg every 6 hours.  It continues to work well for the chorea and the anxiety however she feels it gives her heartburn and congestion.  She says if possible she would like to come off of the clonazepam.    She still complains of being hyper or manic during the day.  She is taking escitalopram 5 mg every day.  She was recently on escitalopram but stopped this as it caused her to be too hyped up.   She says the risperidone has helped with depression as well. She continues to exercise regularly.      Her initial history follows: she first noticed racy, speedy spells in December 2010. These spells consist of her mind racing, she starts speaking, her muscles get tight. Initially she thought these spells were due to taking steroids (for asthma). Shortly afterwards she noticed generalized involuntary movements. She describes them as twitches. She started dropping objects, since mid 2011. She denies difficulty walking, but she admits that her balance can be off at times. In November 2011 she was rocking in a chair and she fell backwards with the whole chair. In 2010 she was trying to step over a dog gate and she fell and hit her face; she suffered a left orbital fracture. She has noticed that she is bumping into things while walking. She still walks and plays golf. She denies hallucinations, delusions, lightheadedness, fainting spells, daytime sedation, nausea or vomiting. She denies memory problems. She admits occasional feelings of depression, especially in the winter. She started sertraline in December 2011, up to 150  mg qd; last increased 10 days ago. It helps some, but she is not sure about the details. She sleeps well, she believes the sertraline helps with her sleep. She denies urinary incontinence or constipation. She has anxiety, that occurs in spells. 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  She discontinued sertraline in the past. She did not feel the need to start tetrabenazine, partially because it would be difficult to get.    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    She has past surgical history that includes Sinus surgery and Tonsillectomy.    Her family history includes Alzheimer's disease in her father; Anxiety disorder in her sister; Depression in her sister; Huntington's disease in her maternal uncle; Huntington's disease (age of onset:60) in her mother; Pulmonary embolism in her mother; and Suicidality in her sister.    She reports that  she has never smoked. She has never used smokeless tobacco. She reports that she drinks about .6 ounces of alcohol per week. She reports that she does not use illicit drugs.      Review of Systems: As in HPI    Allergies:   Zithromax; Bactrim; Erythromycin; Erythromycin base; and Sulfa (sulfonamide antibiotics)    Medications:     Outpatient Encounter Prescriptions as of 08/27/2011   Medication Sig Dispense Refill   ??? albuterol (PROAIR HFA) 90 mcg/actuation inhaler Inhale 2 puffs into  the lungs every 6 hours as needed.       ??? calcium carbonate-vitamin D3 (CALCIUM+D) 400-133.3 mg-unit Tab Take 1 tablet by mouth 2 times a day.       ??? ciclesonide (ALVESCO) 160 mcg/actuation inhaler Inhale 1 puff into the lungs 2 times a day.        ??? clonazepam (KLONOPIN) 0.5 MG tablet Take 0.25 mg by mouth 4 times a day as needed.        ??? coenzyme Q10 400 mg Cap Take 800 mg by mouth 2 times a day.       ??? levalbuterol (XOPENEX) 0.63 mg/3 mL nebulizer solution Inhale 1 ampule by nebulization every 6 hours as needed.       ??? loratadine (CLARITIN) 10 mg tablet Take 10 mg by mouth daily as needed.       ??? MULTIVITAMIN ORAL Take 1 tablet by mouth daily.        ??? omega-3 fatty acids (FISH OIL) 500 mg Cap Take 1 tablet by mouth 2 times a day.       ??? omeprazole (PRILOSEC) 20 MG capsule Take 20 mg by mouth every morning before breakfast.       ??? risperiDONE (RISPERDAL) 0.25 MG tablet Take 0.25 mg by mouth 2 times a day.            Objective:       Blood pressure 116/68, pulse 105, height 5' 5 (1.651 m), weight 128 lb (58.06 kg).    Neurologic Exam    Physical Exam    UHDRS 1: Motor Examination  Huntington's Disease Rating Scale  Ocular pursuit, horizontal: 0  Ocular pursuit, vertical: 0  Saccade initiation, horizontal: 0  Saccade initiation, vertical: 0  Saccade velocity, hortizontal : 1  Saccade velocity, vertical: 1  Dysarthria : 1  Tongue Protrusion: 0  Fingers taps, right: 1  Fingers taps, left : 1  Pronate/suppinate hands, right : 0  Pronate/supinate hands, left : 0  Luria (fist-hand-palm test): 1  Rigidity arms, right : 1  Rigidity arms, left: 2  Body Bradykinesia: 1  Maximal dystonia, trunk: 0  Maximal dystonia, RUE: 0  Maximal dystonia, LUE: 1  Maximal dystonia, RLE: 0  Maximal dystonia, LLE: 0  Maximal chorea, face: 1  Maixmal chorea, BOL: 0  Maximal chorea, trunk: 1   Maximal chorea, RUE: 0  Maximal chorea, LUE: 1  Maximal chorea, RLE: 1  Maximal chorea, LLE: 1  Gait: 1  Tandem Walking: 0  Retropulsion Pull  Test: 1  Diagnosis confidence level: 4  UHDRS 1 TOTAL:: 18           Assessment:     1. Huntington's chorea    2. Anxiety    3. Depression    4. Paranoia      She has family history of HD, and her history and examination are consistent with a clinical diagnosis of Huntington's disease, progressively worse over time. 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 She took sertraline and escitalopram in the past but these made her feel manic. She is taking variable doses of clonazepam which has helped anxiety and chorea overall.  We recommended she take this at scheduled times throughout the day.  Since starting risperidone 0.25 mg BID she has noted great improvement in her breathing and chest tightness in addition to her psychiatric symptoms (depression, paranoia, anxiety), associated with HD.  She could benefit from a further increase in risperidone and we will increase to    Regular stretching exercises were recommended. I extensively discussed these issues with the patient and her husband, and answered all her questions    Plan:     1. Continue clonazepam 0.25 tab tid. Take on a regular schedule.   2. Increase risperidone gradually to 0.5 mg BID.    3. Consider treatment with tetrabenazine in the future.   4. Regular exercise program.   5. Continue to follow w/ Psychiatry.   6. Discussed the risks associated with continued driving. I advised her to stop driving.   7. Follow-up in 3 months.

## 2011-08-31 NOTE — Unmapped (Signed)
Pt has a few questions about side affects of meds./

## 2011-08-31 NOTE — Unmapped (Signed)
I saw and examined the patient.  I discussed with the fellow, Dr. Cara Pecina, and agree with her findings and plan as documented in the note.    [name], MD

## 2011-09-01 NOTE — Unmapped (Signed)
Called pt, side effects were some involuntary movements, she decrease to 3 instead of 4, will keep [place] posted

## 2011-09-10 ENCOUNTER — Ambulatory Visit: Admit: 2011-09-10 | Discharge: 2011-09-10 | Payer: PRIVATE HEALTH INSURANCE

## 2011-09-10 DIAGNOSIS — R059 Cough, unspecified: Secondary | ICD-10-CM

## 2011-09-10 MED ORDER — omeprazole (PRILOSEC) 10 MG capsule
10 | ORAL_CAPSULE | ORAL | Status: AC
Start: 2011-09-10 — End: 2011-09-14

## 2011-09-10 MED ORDER — ranitidine (ZANTAC) 150 MG tablet
150 | ORAL_TABLET | Freq: Two times a day (BID) | ORAL | Status: AC
Start: 2011-09-10 — End: 2011-10-07

## 2011-09-10 NOTE — Unmapped (Signed)
No chief complaint on file.       History of Present Illness  Patient with Huntington's who has had difficulty breathing for 10 years but much worse in last year, cough has been getting worse, some laryngospasms, on risperidal which helped voice, risperidal also stopped spasmodic coughing, was coughing with many triggers, laughing and cold air, has number of allergies, prednisone helped breathing, has heartburn, taking omeprazole which controls it, no throat clearing.            Cancer Staging  No matching staging information was found for the patient.    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    She has past surgical history that includes Sinus surgery and Tonsillectomy.    Her family history includes Alzheimer's disease in her father; Anxiety disorder in her sister; Depression in her sister; Huntington's disease in her maternal uncle; Huntington's disease (age of onset:60) in her mother; Pulmonary embolism in her mother; and Suicidality in her sister.    She reports that she has never smoked. She has never used smokeless tobacco. She reports that she drinks about .6 ounces of alcohol per week. She reports that she does not use illicit drugs.    Allergies  Zithromax; Bactrim; Erythromycin; Erythromycin base; and Sulfa (sulfonamide antibiotics)    Medications  Outpatient Encounter Prescriptions as of 09/10/2011   Medication Sig Dispense Refill   ??? albuterol (PROAIR HFA) 90 mcg/actuation inhaler Inhale 2 puffs into the lungs every 6 hours as needed.       ??? calcium carbonate-vitamin D3 (CALCIUM+D) 400-133.3 mg-unit Tab Take 1 tablet by mouth 2 times a day.       ??? ciclesonide (ALVESCO) 160 mcg/actuation  inhaler Inhale 1 puff into the lungs 2 times a day.        ??? clonazepam (KLONOPIN) 0.5 MG tablet Take 0.25 mg by mouth 4 times a day as needed.        ??? coenzyme Q10 400 mg Cap Take 800 mg by mouth 2 times a day.       ??? levalbuterol (XOPENEX) 0.63 mg/3 mL nebulizer solution Inhale 1 ampule by nebulization every 6 hours as needed.       ??? loratadine (CLARITIN) 10 mg tablet Take 10 mg by mouth daily as needed.       ??? MULTIVITAMIN ORAL Take 1 tablet by mouth daily.        ??? omega-3 fatty acids (FISH OIL) 500 mg Cap Take 1 tablet by mouth 2 times a day.       ??? omeprazole (PRILOSEC) 20 MG capsule Take 20 mg by mouth every morning before breakfast.       ??? risperiDONE (RISPERDAL) 0.25 MG tablet Take 2 tablets (0.5 mg total) by mouth 2 times a day.  120 tablet  11            Review of Systems:  * See scanned Review of Systems sheet.    Vitals  There were no vitals taken for this visit.  Physical Exam   Constitutional: She is oriented to person, place, and time. She appears well-developed and well-nourished.   HENT:   Head: Normocephalic and atraumatic.   Right Ear: External ear normal.   Left Ear: External ear normal.   Mouth/Throat: No oropharyngeal  exudate.        Voice raspy, soft  Nasal mucosa - erythematous mucosa  Oropharynx - mild erythema    Eyes: Conjunctivae and EOM are normal. Pupils are equal, round, and reactive to light. Right eye exhibits no discharge. No scleral icterus.   Neck: Normal range of motion. Neck supple. No JVD present. No tracheal deviation present. No thyromegaly present.   Cardiovascular: Normal rate, regular rhythm and normal heart sounds.  Exam reveals no gallop and no friction rub.    No murmur heard.  Pulmonary/Chest: Effort normal and breath sounds normal. No stridor. No respiratory distress. She has no wheezes. She exhibits no tenderness.   Lymphadenopathy:     She has no cervical adenopathy.   Neurological: She is alert and oriented to person, place, and time. No cranial nerve  deficit.   Skin: Skin is warm and dry. She is not diaphoretic.   Psychiatric: She has a normal mood and affect. Her behavior is normal. Thought content normal.       Lab Review:   Lab Results   Component Value Date    WBC 6.1 06/02/2011    HGB 14.3 06/02/2011    HCT 42.3 06/02/2011    MCV 92.0 06/02/2011    CREATININE 0.8 06/02/2011    BUN 13 06/02/2011    NA 144 06/02/2011    K 4.1 06/02/2011    CL 104 06/02/2011    CO2 32 06/02/2011    ALT 36 06/02/2011    AST 39* 06/02/2011    ALKPHOS 65 06/02/2011    BILITOT 0.40 06/02/2011        Assessment  1. Bilateral bowing, probably secondary to Huntington's. Causing breathy, soft voice. Will get strobe  2. Laryngopharyngeal reflux. Increase omeprazole to bid and add zantac bid.   3. Irritable larynx syndrome. Improving on risperidone. Since ILS is usually caused by neuropathies, her improvement is probably due to risperidone  4. Dyspnea. ? Asthma. Will get PFTs.     Plan       Medical Decision Making:  The following items were considered in medical decision making:  Discussion of test results with performing Physician  Review/order clinical lab tests  Review/order other diagnostic or treatment interventions  Rev iew old records}

## 2011-09-10 NOTE — Unmapped (Signed)
Flexible Laryngoscopy with Biopsy    Anesthesia:  1% Lidocaine with Epi    Estimated Blood Loss:  none    Procedure:    After obtaining consent, the patient was placed in the examination chair in the upright position. Decongestant and topical anesthetic was sprayed in the  right nostril.. After allowing adequate time for hemostatic effect, the flexible 4 mm laryngoscope was passed via the  right nostril..    Nasal Septum:  normal  Nasal Findings: normal  Nasopharynx: normal  Eustachian Tube: normal  Oropharynx:  normal  Base of Tongue:  normal  Epiglottis:  normal  True Vocal Cord: bilateral bowing, moderate-severe posterior pachydermia and thickening.   False Vocal Cord:  normal  True Vocal Cord Movement:  normal  Hypopharynx Mucosa:      The patient tolerated the procedure well and without side effects.  Patient was instructed not to eat for 30 minutes following the procedure. Patient was instructed that they may notice minor bleeding.

## 2011-09-14 MED ORDER — omeprazole (PRILOSEC) 20 MG capsule
20 | ORAL_CAPSULE | Freq: Two times a day (BID) | ORAL | Status: AC
Start: 2011-09-14 — End: 2011-12-28

## 2011-09-16 ENCOUNTER — Ambulatory Visit: Admit: 2011-09-16 | Discharge: 2011-09-16 | Payer: PRIVATE HEALTH INSURANCE | Attending: Speech-Language Pathologist

## 2011-09-16 DIAGNOSIS — IMO0001 Reserved for inherently not codable concepts without codable children: Secondary | ICD-10-CM

## 2011-09-16 NOTE — Unmapped (Signed)
Spoke to pt and advised her that she may be able to get in with Dr Carmon Ginsberg quicker. Phone number given,

## 2011-09-16 NOTE — Unmapped (Signed)
Pt states that the medicarions are not helping and she feels like there is a tear in her diaphragm.  She cannot get relief for her heartburn.  She would like to know how much of the medication she can take to help.  She is taking 150 mg Zantac and has taken 40 mg of Omeprazole the past couple of days.  Is that okay?  She cannot get in to see Dr Mickie Bail until 4.17.13.  Is there another GI doctor she could see in the [place] area?  Please call to discuss / advise.

## 2011-09-16 NOTE — Unmapped (Signed)
This pt had a sinus infection in Oct and since her voice has never returned to normal. The pt has some allergies and use to get allergy shots for years.  She uses inhalers secondary to being diagnosed with asthma.  She is not using her inhaler currently.  She was using Alvesco 160mg  twice daily.  She has difficulty with breathing most likely secondary to Huntington's disease (diagnosed one year ago).  She has reflux and is on Zantac and Omeprazole and had a severe bout with reflux early this morning.  She stated she still was experiencing the heartburn.  The pt feels like she runs out of breath when she speaks and her voice is soft and whispery.  She works out on an Engineer, materials and does yoga (2 to 3 x weekly).  She stretches her muscles daily.  She received voice therapy at [facility] 3 to 4 years ago.       Medications: Risperidone, Clonazepan, Omeprazole, Ranitidine, Coenzyme Q10, Claritin, Vitamin, calcium    Reflux Symptom Index Score: 25(<13wnl)  Voice Handicap Index Score: f = 33, P = 24, E = 23    Please see VLS report in media section of chart.

## 2011-09-16 NOTE — Unmapped (Signed)
Patient call stating that she was having stomach pain . Spoke with [facility] he suggested that she can go see a GI . Mailing out referral Spoke with patient and she is aware and going to call the GI office

## 2011-09-16 NOTE — Unmapped (Signed)
Patient can see Brenda [name] . If she cant get in until April then she can see anyone at [facility]

## 2011-09-20 NOTE — Telephone Encounter (Signed)
Danielle Lawrence, I don't see resperdal as a medication in epic.  Do you mind rechecking?  May be in paper chart

## 2011-09-20 NOTE — Telephone Encounter (Signed)
Patient states she has been having some GI problem and some fatigue, she believe its due to the risperdone. She thinks its the zantac and risperdone are interacting, she wants to know if she needs to cut back on the risperdone, because she doesn't see her GI Doctor for another 2 weeks.

## 2011-09-20 NOTE — Unmapped (Signed)
Spoke to pt regarding symptoms, going to call us back Monday to update if she still getting tired

## 2011-09-20 NOTE — Unmapped (Signed)
Patient has question about medication she is taking

## 2011-09-20 NOTE — Unmapped (Signed)
Pt is calling back stating she is so tired and thirsty and is needing to go over her medications with you. Please call to advise.

## 2011-09-21 NOTE — Telephone Encounter (Signed)
I didn't see Resperdal in the paper chart either, i called the patient she states it was prescribed by her neurologist a couple months ago, she contacted the neurologist and was able to get that problem solved. i went ahead and put it in her medication list.

## 2011-09-22 ENCOUNTER — Ambulatory Visit: Admit: 2011-09-22 | Discharge: 2011-09-22 | Payer: PRIVATE HEALTH INSURANCE

## 2011-09-22 ENCOUNTER — Ambulatory Visit: Admit: 2011-09-22 | Discharge: 2011-09-22 | Payer: PRIVATE HEALTH INSURANCE | Attending: Speech-Language Pathologist

## 2011-09-22 DIAGNOSIS — R059 Cough, unspecified: Secondary | ICD-10-CM

## 2011-09-22 DIAGNOSIS — IMO0001 Reserved for inherently not codable concepts without codable children: Secondary | ICD-10-CM

## 2011-09-22 NOTE — Unmapped (Signed)
This pt has difficulty with her breathing which is most likely secondary to Huntington's disease (diagnosed one year ago). The pt has difficulty coordinating good support to produce an open oral front focus when speaking.  She is throaty when speaking and than goes into a coughing spell.  The pt feels like she runs out of breath when she speaks and her voice is soft and whispery She has reflux and is on Zantac and Omeprazole.     Worked on the following vocal exercises to coordinate the three subsystems of voicing:    1.  ???woo???  into cup hand over mouth (feel the buzz)  2. ???woo??? into cup hand over mouth and release ???wah???  3. ???woo??? into cup hand over mouth and release ???we???  4. ???woo??? into cup hand over mouth and release ???wo???  5. ???woo??? into cup hand over mouth and release ???woo???  6. ???woo??? buzz at the lips and say ???wah???  7. ???woo??? buzz at the lips and say ???we???  8. ???woo??? buzz at the lips and say ???wo???  9. ???woo??? buzz at the lips and say ???woo???  10. ???woo??? buzz at the lips and say ???wah, mah???  11. ???woo??? buzz at the lips and say ???we, me???  12. ???woo??? buzz at the lips and say ??? wo, moe???  13. ???woo??? buzz at the lips and say ???woo, moo???  Practice several times daily - don???t let the buzz go into the throat.  Kazoo exercises:  1.  Sustain at comfort pitch x5 each  2. Sustain at comfort high pitch x5 each  3. Sustain at comfort low pitch x 5 each    The pt did very well and her voice improved with better support following the above exercises.  The pt was also able to identify when she became throaty and not well supported when speaking.    Plan:  Pt to work on the above exercises daily, multiple times throughout the day.    Written exercises were given.  RTC

## 2011-10-04 ENCOUNTER — Ambulatory Visit: Admit: 2011-10-04 | Payer: PRIVATE HEALTH INSURANCE

## 2011-10-04 DIAGNOSIS — K219 Gastro-esophageal reflux disease without esophagitis: Secondary | ICD-10-CM

## 2011-10-04 MED ORDER — omeprazole (PRILOSEC) 40 MG capsule
40 | ORAL_CAPSULE | Freq: Every morning | ORAL | Status: AC
Start: 2011-10-04 — End: 2011-11-02

## 2011-10-04 NOTE — Unmapped (Signed)
Chief Complaint   Patient presents with   ??? Gastroesophageal Reflux   ??? Heartburn        History of Present Illness:   Brenda [name] is a 56 y.o. female who presents for evaluation of possible GERD. Many years, now worse. Describes low retrosternal burning, alleviated but not abolished on OTC PPI. There seems to be a postural component. No regurgitation. Sme voice changes - was told that she has esophageal spasm but does not describe any chest pain. No prior chest trauma. Takes an H2RA bid. No ASA or NSAIDs. Apparent reactive airways disease - seems controlled. Non-allergic. Coughs at times (spasms) but denies post prandial cough. No cough following swallowing her saliva. Bloats post prandial and early satiety. Has Huntingdons. Mother had it too. Taking Clonazepam, Risperdal. 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       Past Medical History:  She has a past medical history of Asthma; Environmental allergies; Superficial injury of face without infection (04/12/2009); Hematoma, left eyebrow (04/18/2009); Huntington's chorea (08/11/2010); Esophageal reflux (08/10/2010); Osteopenia (08/10/2010); Fracture of left orbit (2010); Anxiety; Fatigue (05/24/2011); and Chronic sinusitis (05/24/2011).    Medications:  Current outpatient prescriptions:albuterol (PROAIR HFA) 90 mcg/actuation inhaler, Inhale 2 puffs into the lungs every 6 hours as needed., Disp: , Rfl: ;  calcium carbonate-vitamin D3 (CALCIUM+D) 400-133.3 mg-unit Tab, Take 1 tablet by mouth 2 times a day., Disp: , Rfl: ;  ciclesonide (ALVESCO) 160 mcg/actuation inhaler, Inhale 1 puff into the lungs 2 times a day. , Disp: , Rfl:   clonazepam (KLONOPIN) 0.5 MG tablet, Take 0.25 mg by mouth 4 times a day as needed. , Disp: , Rfl: ;  coenzyme Q10 400 mg Cap, Take 800 mg by mouth 2 times a day., Disp: , Rfl: ;  levalbuterol (XOPENEX) 0.63 mg/3 mL nebulizer solution, Inhale 1 ampule by nebulization every 6 hours as needed., Disp: , Rfl: ;  loratadine  (CLARITIN) 10 mg tablet, Take 10 mg by mouth daily as needed., Disp: , Rfl:   MULTIVITAMIN ORAL, Take 1 tablet by mouth daily. , Disp: , Rfl: ;  omega-3 fatty acids (FISH OIL) 500 mg Cap, Take 1 tablet by mouth 2 times a day., Disp: , Rfl: ;  omeprazole (PRILOSEC) 20 MG capsule, Take 1 capsule (20 mg total) by mouth 2 times a day., Disp: 60 capsule, Rfl: 6;  ranitidine (ZANTAC) 150 MG tablet, Take 1 tablet (150 mg total) by mouth 2 times a day., Disp: 60 tablet, Rfl: 0  risperiDONE (RISPERDAL) 0.25 MG tablet, Take 2 tablets (0.5 mg total) by mouth 2 times a day., Disp: 120 tablet, Rfl: 11;  omeprazole (PRILOSEC) 40 MG capsule, Take 1 capsule (40 mg total) by mouth every morning before breakfast., Disp: 30 capsule, Rfl: 3    Allergies:  Zithromax; Bactrim; Erythromycin; Erythromycin base; and Sulfa (sulfonamide antibiotics)    Family History:  Her family history includes Alzheimer's disease in her father; Anxiety disorder in her sister; Depression in her sister; Huntington's disease in her maternal uncle; Huntington's disease (age of onset:60) in her mother; Pulmonary embolism in her mother; and Suicidality in her sister.    Past Surgical History:  She has past surgical history that includes Sinus surgery and Tonsillectomy.    Social History:  She reports that she has never smoked. She has never used smokeless tobacco. She reports that she drinks about .6 ounces of alcohol per week. She reports that she does not use illicit drugs.    Review  of Systems:  * See scanned Review of Systems sheet.    Vital Signs:  Blood pressure 162/96, pulse 97, weight 133 lb (60.328 kg).  Physical Exam  CONSTITUTIONAL: Alert, well developed, well nourished  SKIN: Normal, no rashes  HEAD: Grossly symmetrical  ENT: Pharynx, dentition and tongue normal  EYES: No icterus; normal inspection of eyes and lids  NECK: Supple, no masses, nodes, no thyromegaly  CARDIOVASCULAR: Normal rate, rhythm, no murmurs, no lower extremity edema, pulses  generally equal  RESPIRATORY: No respiratory distress, good air entry, no extra sounds  GASTROINTESTINAL: Abdomen, soft, non tender, no rebound, non-distended, no masses or overt fluid, no organomegaly, no hernias, bowel sounds normal  RECTAL EXAM: Not performed  MUSCULOSKELETAL: Grossly normal, no tenderness, generally normal range of motion, nails generally OK  NEUROLOGICAL: Normal gait, normal muscle strength, nil localizing, cn ii-xii grossly intact  PSYCHIATRIC: Difficulty concentrating on question  OTHER: Nil pertinent  Review of Lab Results:  Lab Results   Component Value Date    WBC 6.1 06/02/2011    HGB 14.3 06/02/2011    HCT 42.3 06/02/2011    MCV 92.0 06/02/2011    CREATININE 0.8 06/02/2011    BUN 13 06/02/2011    NA 144 06/02/2011    K 4.1 06/02/2011    CL 104 06/02/2011    CO2 32 06/02/2011    ALT 36 06/02/2011    AST 39* 06/02/2011    ALKPHOS 65 06/02/2011    BILITOT 0.40 06/02/2011        Assessment & Plan:  56 y.o. female who presents for evaluation of possible GERD. Many years, now worse. Describes low retrosternal burning, alleviated but not abolished on OTC PPI. There seems to be a postural component. No regurgitation. Sme voice changes - was told that she has esophageal spasm but does not describe any chest pain. No prior chest trauma. Takes an H2RA bid. No ASA or NSAIDs. Apparent reactive airways disease - seems controlled. Non-allergic. Coughs at times (spasms) but denies post prandial cough. No cough following swallowing her saliva. Bloats post prandial and early satiety. Has Huntingdons. Mother had it too. Taking Clonazepam, Risperdal. 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     PE: Non contributory.    Atypical history. Perhaps dysmotility playing a role here (secondary to anticholinergic effects of psych meds) - with element of gastroparesis too. Discussion. Increase dose of PPI at this time. Proceed to EGD. No functional studies yet. All questions addressed.

## 2011-10-04 NOTE — Unmapped (Signed)
Lifestyle modifications

## 2011-10-05 ENCOUNTER — Encounter: Payer: PRIVATE HEALTH INSURANCE | Attending: Speech-Language Pathologist

## 2011-10-06 ENCOUNTER — Ambulatory Visit: Admit: 2011-10-06 | Discharge: 2011-10-06 | Payer: PRIVATE HEALTH INSURANCE | Attending: Speech-Language Pathologist

## 2011-10-06 DIAGNOSIS — IMO0001 Reserved for inherently not codable concepts without codable children: Secondary | ICD-10-CM

## 2011-10-06 NOTE — Unmapped (Signed)
This pt has difficulty with her breathing which is most likely secondary to Huntington's disease (diagnosed one year ago). The pt has difficulty coordinating good support to produce an open oral front focus when speaking. She is throaty when speaking and than goes into a coughing spell. The pt feels like she runs out of breath when she speaks and her voice is soft and whispery She has reflux and is on Zantac and Omeprazole.     Pt saw Dr. Cassell Smiles and he will be doing an EGD. Pt reported she has improved on Zantac and is not getting the breakthrough reflux she did prior to taking the medication.      Pt reported her voice is improving.  She talked on the phone for 1 hour without any coughing and her friend could hear her.    Worked on the following vocal exercises to coordinate the three subsystems of voicing:   1. ???woo??? into cup hand over mouth (feel the buzz)  2. ???woo??? into cup hand over mouth and release ???wah???  3. ???woo??? into cup hand over mouth and release ???we???  4. ???woo??? into cup hand over mouth and release ???wo???  5. ???woo??? into cup hand over mouth and release ???woo???  6. ???woo??? buzz at the lips and say ???wah???  7. ???woo??? buzz at the lips and say ???we???  8. ???woo??? buzz at the lips and say ???wo???  9. ???woo??? buzz at the lips and say ???woo???  10. ???woo??? buzz at the lips and say ???wah, mah???  11. ???woo??? buzz at the lips and say ???we, me???  12. ???woo??? buzz at the lips and say ??? wo, moe???  13. ???woo??? buzz at the lips and say ???woo, moo???  Practice several times daily - don???t let the buzz go into the throat.     WOO exercises:   1. Sustain at comfort pitch  24.8, 17.8, 20.0   2. Sustain at comfort high pitch 16.0, 21.1, 17.1  3. Sustain at comfort low pitch 14.2, 17.0, 17.0    Glide up in pitch on the word whoop  Glide down in pitch on the word 'boom    Sustain the vowel 'e' for as long as possible 17.0 seconds    The pt did very well and her voice improved with better support following the above exercises. The pt was also able to identify  when she became throaty and not well supported when speaking.   Plan:   Pt to work on the above exercises daily, multiple times throughout the day.   RTC

## 2011-10-07 MED ORDER — ranitidine (ZANTAC) 150 MG tablet
150 | ORAL_TABLET | Freq: Two times a day (BID) | ORAL | Status: AC
Start: 2011-10-07 — End: 2012-02-02

## 2011-10-07 NOTE — Unmapped (Signed)
PT NEEDS A REFILL ON ranitidine (ZANTAC) 150 MG tablet

## 2011-10-07 NOTE — Unmapped (Signed)
E scribed to pharmacy.

## 2011-10-08 MED ORDER — risperiDONE (RISPERDAL) 0.25 MG tablet
0.25 | ORAL_TABLET | ORAL | Status: AC
Start: 2011-10-08 — End: 2011-11-02

## 2011-10-08 NOTE — Unmapped (Signed)
Spoke to patient.  Saying since increasing her risperidone to 0.5 mg BID (2 tablets BID) she has had a brain fog and is extremely thirsty.  She wants to try to cut down to 1.5 tablets BID and I said this was ok.  I sent a new script to her pharmacy.

## 2011-10-08 NOTE — Unmapped (Signed)
Patient is having some side affects with medication. Would like to talk to dr or MA

## 2011-10-08 NOTE — Unmapped (Signed)
Fwd to Dr. pecina

## 2011-10-11 NOTE — Unmapped (Signed)
Pt is stating the medication change has worked.

## 2011-10-11 NOTE — Unmapped (Signed)
noted 

## 2011-10-12 ENCOUNTER — Ambulatory Visit: Admit: 2011-10-12 | Discharge: 2011-10-12 | Payer: PRIVATE HEALTH INSURANCE | Attending: Speech-Language Pathologist

## 2011-10-12 DIAGNOSIS — IMO0001 Reserved for inherently not codable concepts without codable children: Secondary | ICD-10-CM

## 2011-10-12 NOTE — Unmapped (Signed)
This pt has difficulty with her breathing which is most likely secondary to Huntington's disease (diagnosed one year ago). The pt has difficulty coordinating good support to produce an open oral front focus when speaking. She is throaty when speaking and than goes into a coughing spell. The pt feels like she runs out of breath when she speaks and her voice is soft and whispery She has reflux and is on Zantac and Omeprazole.   Pt saw Dr. Cassell Smiles and pt to have an EGD.    Pt reported drinking a ton of water.  She feels the higher dosage of Risperidone of 1mg  was dehydrating her and now she cut it to .75mg  daily.  She has increased thirst, tiredness, and dryness of the lips as well as coldness.  She reported by cutting down she has improved.    Pt reported noted improvement being on Zantac.  The reflux medication is keeping reflux under control.  The pt reported her voice is a little weak but is still improving.     Practice several times daily - don???t let the buzz go into the throat.   WOO exercises:   1. Sustain at comfort pitch 18.3, 14.3, 20.0   2. Sustain at comfort high pitch 24.0, 23.0, 21.0  3. Sustain at comfort low pitch 20.0, 22.0, 17.1  Glide up in pitch on the word whoop   Glide down in pitch on the word 'boom   Sustain the vowel 'e' for as long as possible 15.0/, 19.0,  seconds   Introduced the song melodies with woo    The pt's coordination of breathing and speaking has improved.  She is able to speak longer sentences without having to take a breath.  Voice is not as throaty and raspy and pt is make an effort and keeping it supported.  Plan:   Pt to work on the above exercises daily, multiple times throughout the day.   RTC in June

## 2011-10-14 NOTE — Unmapped (Signed)
At four tablets of risperidone, never adjusted to meds, now taking 1.5 in am and 1.5 in pm, needs to know if clonazepam can be increased on day of endoscope

## 2011-10-14 NOTE — Unmapped (Signed)
Patient wants to speak with MA about medications.

## 2011-10-14 NOTE — Unmapped (Signed)
Pt returning call

## 2011-10-14 NOTE — Unmapped (Signed)
lmtcb

## 2011-10-22 NOTE — Unmapped (Signed)
Fwd to Dr. Reather Converse, pt has called several times with same side effects. Can tolerate or decrease medication. Will call to advise

## 2011-10-22 NOTE — Unmapped (Signed)
Trouble w/side effects, dehydrated, tired

## 2011-10-25 NOTE — Unmapped (Signed)
Pt calling for MA. Call around 8am.

## 2011-10-26 NOTE — Unmapped (Signed)
Called patient's home and cell phone but got voicemail.  Left message for her to call us back.

## 2011-10-26 NOTE — Unmapped (Signed)
Pt having serious side effects from risperidone, spacey, tired, dehydrated

## 2011-10-26 NOTE — Unmapped (Signed)
Fwd. To Dr. Reather Converse

## 2011-11-01 MED ORDER — ciprofloxacin (CIPRO) 250 MG tablet
250 | ORAL_TABLET | Freq: Two times a day (BID) | ORAL | Status: AC
Start: 2011-11-01 — End: 2011-12-28

## 2011-11-01 NOTE — Unmapped (Signed)
Spoke to patient.  She has been having symptoms of a UTI and wanted [place] to call in antibiotics.  I told her it would be best for her to call her PCP as they may want to do a UA and urine culture and they would be best suited to treat her UTI.

## 2011-11-01 NOTE — Unmapped (Signed)
Scheduled sooner

## 2011-11-01 NOTE — Unmapped (Signed)
Patient called to advise of UTI with pain and burning since 4/28 and would like something called into pharmacy on file.

## 2011-11-01 NOTE — Unmapped (Signed)
Pt is having a side effect to risperidone, stating she is always thirsty. Pt is also saying she has a urinary tract infection and is wanting to know if a medication can be called in? [facility] [phone]

## 2011-11-01 NOTE — Unmapped (Addendum)
Pt did talk to Dr Reather Converse today - pt did talk to pcp - pt asking if she can be seen sooner than 5/17 b/c she is not functioning, too tired etc

## 2011-11-01 NOTE — Unmapped (Signed)
Pt advised

## 2011-11-02 ENCOUNTER — Ambulatory Visit: Admit: 2011-11-02 | Discharge: 2011-11-02 | Payer: PRIVATE HEALTH INSURANCE | Attending: Neurology

## 2011-11-02 DIAGNOSIS — G10 Huntington's disease: Secondary | ICD-10-CM

## 2011-11-02 MED ORDER — clonazePAM (KLONOPIN) 0.5 MG tablet
0.5 | ORAL_TABLET | Freq: Two times a day (BID) | ORAL | Status: AC
Start: 2011-11-02 — End: 2011-11-17

## 2011-11-02 MED ORDER — risperiDONE (RISPERDAL) 0.25 MG tablet
0.25 | ORAL_TABLET | Freq: Two times a day (BID) | ORAL | Status: AC
Start: 2011-11-02 — End: 2011-12-28

## 2011-11-02 NOTE — Unmapped (Signed)
scheduled

## 2011-11-02 NOTE — Unmapped (Signed)
MA offering pt today around 12:30 after she cks w/Dr Heywood Footman, pt will be ready to go when she gets call

## 2011-11-02 NOTE — Unmapped (Signed)
Pt is wanting to talk about appt on 11/05/11 and medications. Please call to advise.

## 2011-11-02 NOTE — Unmapped (Signed)
Pt taking half risperidone, and would like change appt to next week

## 2011-11-02 NOTE — Unmapped (Signed)
Subjective:      Patient ID: Brenda [name] is a 56 y.o. female.    HPI   Movement Disorders HPI    She is here for an earlier visit, by herself. She is having thirsty spells. She has had a UTI for the past week. She just started taking Cipro yesterday. Her head feels foggy. She reports daytime sedation. She bumped her car in her garage recently, she feels too spacey and tired. 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 She keeps talking throughout the interview, jumping from one subject to the next one. Overall, she feels that she is experiencing side effects from medications, but they are probably helping with her muscles and her breathing.    From previous notes: she is taking risperidone which has helped her and she is taking it BID. She says it has especially helped with her breathing and chest tightness. She says it also makes her feel content and relaxed and has decreased her chorea. She continues to take clonazepam 0.25 mg every 6 hours. It continues to work well for the chorea and the anxiety however she feels it gives her heartburn and congestion. She says if possible she would like to come off of the clonazepam. She still complains of being hyper or manic during the day. She is taking escitalopram 5 mg every day. She was recently on escitalopram but stopped this as it caused her to be too hyped up. She says the risperidone has helped with depression as well. She continues to exercise regularly.    Her initial history follows: she first noticed racy, speedy spells in December 2010. These spells consist of her mind racing, she starts speaking, her muscles get tight. Initially she thought these spells were due to taking steroids (for asthma). Shortly afterwards she noticed generalized involuntary movements. She describes them as twitches. She started dropping objects, since mid 2011. She denies difficulty walking, but she  admits that her balance can be off at times. In November 2011 she was rocking in a chair and she fell backwards with the whole chair. In 2010 she was trying to step over a dog gate and she fell and hit her face; she suffered a left orbital fracture. She has noticed that she is bumping into things while walking. She still walks and plays golf. She denies hallucinations, delusions, lightheadedness, fainting spells, daytime sedation, nausea or vomiting. She denies memory problems. She admits occasional feelings of depression, especially in the winter. She started sertraline in December 2011, up to 150 mg qd; last increased 10 days ago. It helps some, but she is not sure about the details. She sleeps well, she believes the sertraline helps with her sleep. She denies urinary incontinence or constipation. She has anxiety, that occurs in spells. 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 She discontinued sertraline in the past. She did not feel the need to start tetrabenazine, partially because it would be difficult to get.    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    She has past surgical history that includes Sinus surgery and Tonsillectomy.    Her family history includes Alzheimer's disease in her father; Anxiety disorder in her sister; Depression in her sister; Huntington's disease in her maternal uncle; Huntington's disease (age of onset:60) in  her mother; Pulmonary embolism in her mother; and Suicidality in her sister.    She reports that she has never smoked. She has never used smokeless tobacco. She reports that she drinks about .6 ounces of alcohol per week. She reports that she does not use illicit drugs.    Review of Systems as per HPI    Allergies:   Zithromax; Bactrim; Erythromycin; Erythromycin base; and Sulfa (sulfonamide antibiotics)    Medications:     Outpatient Encounter Prescriptions as of 11/02/2011   Medication Sig Dispense Refill   ??? calcium carbonate-vitamin D3 (CALCIUM+D) 400-133.3 mg-unit Tab Take 1 tablet by mouth 2 times a day.       ??? ciclesonide (ALVESCO) 160 mcg/actuation inhaler Inhale 1 puff into the lungs 2 times a day.        ??? ciprofloxacin (CIPRO) 250 MG tablet Take 1 tablet (250 mg total) by mouth 2 times a day.  10 tablet  0   ??? coenzyme Q10 400 mg Cap Take 800 mg by mouth 2 times a day.       ??? levalbuterol (XOPENEX) 0.63 mg/3 mL nebulizer solution Inhale 1 ampule by nebulization every 6 hours as needed.       ??? MULTIVITAMIN ORAL Take 1 tablet by mouth daily.        ??? omega-3 fatty acids (FISH OIL) 500 mg Cap Take 1 tablet by mouth 2 times a day.       ??? omeprazole (PRILOSEC) 20 MG capsule Take 1 capsule (20 mg total) by mouth 2 times a day.  60 capsule  6   ??? ranitidine (ZANTAC) 150 MG tablet Take 1 tablet (150 mg total) by mouth 2 times a day.  60 tablet  3   ??? risperiDONE (RISPERDAL) 0.25 MG tablet Take 0.25 mg by mouth 2 times a day.       ??? DISCONTD: omeprazole (PRILOSEC) 40 MG capsule Take 1 capsule (40 mg total) by mouth every morning before breakfast.  30 capsule  3   ??? clonazepam (KLONOPIN) 0.5 MG tablet Take 0.5 mg by mouth at bedtime as needed.        ??? loratadine (CLARITIN) 10 mg tablet Take 10 mg by mouth daily as needed.       ??? DISCONTD: albuterol (PROAIR HFA) 90 mcg/actuation inhaler Inhale 2 puffs into the lungs every 6 hours as needed.       ??? DISCONTD: risperiDONE (RISPERDAL) 0.25 MG tablet Take 1.5  tablets BID  90 tablet  11      Objective:       Blood pressure 98/64, pulse 94.    Neurologic Exam  UHDRS 1: Motor Examination  Huntington's Disease Rating Scale  Ocular pursuit, horizontal: 1  Ocular pursuit, vertical: 1  Saccade initiation, horizontal: 2  Saccade initiation, vertical: 2  Saccade velocity, hortizontal : 1  Saccade velocity, vertical: 1  Dysarthria : 1  Tongue Protrusion: 0  Fingers taps, right: 1  Fingers taps, left : 1  Pronate/supinate hands, right : 0  Pronate/supinate hands, left : 0  Luria (  fist-hand-palm test): 2  Rigidity arms, right : 0  Rigidity arms, left: 0  Body Bradykinesia: 1  Maximal dystonia, trunk: 0  Maximal dystonia, RUE: 1  Maximal dystonia, LUE: 1  Maximal dystonia, RLE: 1  Maximal dystonia, LLE: 1  Maximal chorea, face: 1  Maixmal chorea, BOL: 0  Maximal chorea, trunk: 1   Maximal chorea, RUE: 1  Maximal chorea, LUE: 1  Maximal chorea, RLE: 1  Maximal chorea, LLE: 1  Gait: 1  Tandem Walking: 1  Retropulsion Pull Test: 2  Diagnosis confidence level: 4  UHDRS 1 TOTAL:: 28   Physical Exam     Assessment:     1. Huntington's chorea      She has family history of HD, and her history and examination are consistent with a clinical diagnosis of Huntington's disease, progressively worse over time. 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 She took sertraline and escitalopram in the past but these made her feel manic. She is taking variable doses of clonazepam which has helped anxiety and chorea overall; but she has decreased the dose recently. We recommended she take this at scheduled times throughout the day. Since the last visit she has lowered the dose of risperidone, due to possible side effects, but her experience in the past was that after starting this drug she had noted great improvement in her breathing and chest tightness in addition to her psychiatric symptoms (depression, paranoia, anxiety), associated with HD. She  could benefit from a further increase in risperidone but she is reluctant to increase the dose; actually, she would like to reduce the morning dose in order to decrease the side effects. At this point, I am not sure if she is compliant with her medication regimen.  Regular stretching exercises were recommended. I extensively discussed these issues with the patient and called her husband on the phone (left a message for him to call me back), and answered all her questions    Plan:   1. Restart taking clonazepam 0.25 mg bid, on a regular schedule. 2. Same dose of risperidone for now. 3. Consider treatment with tetrabenazine in the future. 4. Regular exercise program. 5. Continue to follow w/ Psychiatry. 6. Discussed the risks associated with continued driving. I advised her to stop driving. 7. Discuss her case with her husband on the phone, once he calls me back. 8. Follow-up in 3 months.

## 2011-11-03 NOTE — Unmapped (Signed)
Pt returning call.

## 2011-11-05 ENCOUNTER — Encounter: Payer: PRIVATE HEALTH INSURANCE | Attending: Neurology

## 2011-11-06 NOTE — Unmapped (Signed)
Calling regarding UTI symptoms.  Started having UTI symptoms about 5 days ago and was called in a prescription for ciprofloxacin.  Has been taking it bid for the past 5 days.  Feels that her symptoms resolve after each dose, but then develops urinary burning and irritation about 8-9 hours after each dose.  No fevers.  Taking the last of the medication today.  Tried cranberry juice but it gave her loose stools.  She is wondering if she should continue to take this or another antibiotic.  Given that her symptoms have not yet fully resolved, would recommend checking a U/A and culture to guide therapy.  Also currently having symptoms of a cold/URI, so could have viral cystitis.  If symptoms persist through the weekend, recommended calling to schedule an appointment or at least a nurse visit to give a urine sample.  Encouraged plenty of fluid intake.

## 2011-11-09 ENCOUNTER — Encounter: Payer: PRIVATE HEALTH INSURANCE | Attending: Neurology

## 2011-11-09 ENCOUNTER — Ambulatory Visit: Payer: PRIVATE HEALTH INSURANCE

## 2011-11-09 LAB — POCT URINALYSIS DIPSTICK
Bilirubin, UA: NEGATIVE
Blood, UA POC: NEGATIVE
Glucose, UA POC: NEGATIVE
Ketones, UA: NEGATIVE
Leukocytes, UA: NEGATIVE
Nitrite, UA: NEGATIVE
Protein, UA POC: NEGATIVE
Spec Grav, UA: 1.015
Urobilinogen, UA: NEGATIVE
pH, UA: 5.5

## 2011-11-09 NOTE — Progress Notes (Signed)
Subjective:      Patient ID: Danielle Lawrence is a 56 y.o. female.    HPI    Here for f/u uti. Evaluated and tx by a physician in [place].  Finished abx 3 days ago.  Continues to have some dysuria.  No frequency, hesitancy, fevers, chills, abdominal pain or flank pain.  States her mind is "foggy" and mind racing from her HD.  She has been weaning herself off the risperidone due to SE of dry mouth.  Weaning plan started by Neurologist.  States she felt her HD was better controlled on risperidone but plans to restart clonazepam    Review of Systems  As above  Objective:   Physical Exam  Gen:nad  Speech tangential  Involuntary movements of hands, legs and head present  Heart and lungs are unremarkable  No flank tenderness to palpation  Abdomin is soft and nt  Assessment:      1. Uti: Urine dipstick unremarkable today. Will send for culture.  Increase fluids  2. HD: Appears to be worsening. Recommend close f/u with Neurology       Plan:      25 minutes spent face to face with pt

## 2011-11-09 NOTE — Unmapped (Signed)
There is an urgent care on [address]  Perhaps that is the best bet

## 2011-11-09 NOTE — Unmapped (Signed)
lmcb

## 2011-11-09 NOTE — Unmapped (Signed)
Patient states that she still has the UTI and is not getting any better. She was scheduled for today at 4:30pm but she states she is too tired and week to drive. 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 Please advise.

## 2011-11-10 NOTE — Unmapped (Signed)
Pt husband wanting to speak with dr or MA regarding medications asap.

## 2011-11-10 NOTE — Unmapped (Signed)
Called pt today, she did go to urgent care. No uti but they did a culture. She is on antibiotic

## 2011-11-10 NOTE — Unmapped (Signed)
Spoke with pt husband, stated to continue on dosage and follow dr. Resa Miner instructions for two weeks

## 2011-11-10 NOTE — Unmapped (Signed)
Has some questions on medication.

## 2011-11-10 NOTE — Unmapped (Signed)
Spoke to pts husband, is taking pt to get a consult from another neurologist. Will fax over record release to sign. Was a recommendation of Social [facility], pt has HD and needs to hear treatment and diagnosis from another dr.

## 2011-11-11 ENCOUNTER — Telehealth

## 2011-11-11 NOTE — Telephone Encounter (Signed)
Patient states she is still having pain and burning and would like to know what she can take? She has been trying cranberry juice.

## 2011-11-11 NOTE — Telephone Encounter (Signed)
Patient informed and urology referral sent.

## 2011-11-11 NOTE — Telephone Encounter (Signed)
Will f/u on urine culture. If having continued symptoms. Recommend urology referral

## 2011-11-11 NOTE — Unmapped (Signed)
Pt is asking for you to call her in the morning about medications.

## 2011-11-11 NOTE — Unmapped (Signed)
Pt wants to discontinue risperidone in the am, and take 1 whole clonazepam instead. Will fwd to Dr. Heywood Footman

## 2011-11-11 NOTE — Unmapped (Signed)
MEDICATION  SIDE EFFECTS -give her a call

## 2011-11-12 NOTE — Telephone Encounter (Signed)
Danielle Lawrence has called times 3 for test results for her UTI.  Also, wanted you to know that she still is not feeling any better.  Would like a call before the weekend so she knows what to do.

## 2011-11-12 NOTE — Unmapped (Signed)
Dr. Heywood Footman will call back during clinic today.

## 2011-11-12 NOTE — Unmapped (Signed)
Dr. Heywood Footman spoke to husband about pt medication.

## 2011-11-15 NOTE — Telephone Encounter (Signed)
Urine culture negative. Please remind pt that she has been referred to urology

## 2011-11-15 NOTE — Telephone Encounter (Signed)
Patient informed and was given dr. Mariel Craftkrick number.

## 2011-11-15 NOTE — Unmapped (Signed)
Will call back tomorrow

## 2011-11-15 NOTE — Unmapped (Addendum)
Have you received ed the information from [facility]?? Has this been faxed back? They can not schedule appointment until we have the information they are requesting.

## 2011-11-15 NOTE — Unmapped (Signed)
She would like to speak to you. If you can not call her back today could you call her tomorrow in the morning as she has a doctors appointment she has to go to.

## 2011-11-15 NOTE — Unmapped (Signed)
faxed last office notes and copy of ins cards

## 2011-11-16 NOTE — Unmapped (Signed)
Pt called back has tired spells between 11 am and 3 pm - states if she drinks a coke it perks her up -    I told her that you would be calling her on your first break around 11am - she wants to get out and ask if you can call her before 11 am

## 2011-11-16 NOTE — Unmapped (Signed)
Pt now needing Clonazepam called in. Wanting to speak with MA

## 2011-11-17 MED ORDER — clonazePAM (KLONOPIN) 0.5 MG tablet
0.5 | ORAL_TABLET | Freq: Two times a day (BID) | ORAL | Status: AC
Start: 2011-11-17 — End: 2012-03-31

## 2011-11-17 NOTE — Unmapped (Signed)
She would like to talk to you about the risperidone and clonazepam and making some adjustments to it if possible.

## 2011-11-17 NOTE — Unmapped (Signed)
Patient wanted to increase Risperidone to 0.25 mg to 1 tablet twice daily rather than 1/2 tablet in the morning and 1 tablet at bedtime.  I told her this would be fine.

## 2011-11-17 NOTE — Unmapped (Signed)
Talked to pt, Dr. Reather Converse, she increase risperidone to 2tabs 2 times daily.

## 2011-11-17 NOTE — Unmapped (Signed)
called in prescription with 5 refills

## 2011-11-19 ENCOUNTER — Encounter: Payer: PRIVATE HEALTH INSURANCE | Attending: Neurology

## 2011-11-23 ENCOUNTER — Encounter

## 2011-11-24 NOTE — Unmapped (Signed)
Medication working well, still thirsty spells, pt wanted to know if she could drink electrolytes?, will ask Dr. Reather Converse., she said it was ok.

## 2011-11-24 NOTE — Unmapped (Signed)
Pt wanting to speak with MA. She said it is very personal.

## 2011-11-26 NOTE — Unmapped (Signed)
Pt has a few questions for MA regarding chapped face.

## 2011-11-30 ENCOUNTER — Encounter: Payer: PRIVATE HEALTH INSURANCE | Attending: Neurology

## 2011-11-30 NOTE — Unmapped (Signed)
Instructed to call PCP

## 2011-12-10 ENCOUNTER — Ambulatory Visit: Admit: 2011-12-10 | Payer: PRIVATE HEALTH INSURANCE

## 2011-12-10 DIAGNOSIS — K219 Gastro-esophageal reflux disease without esophagitis: Secondary | ICD-10-CM

## 2011-12-10 NOTE — Unmapped (Signed)
Chief Complaint   Patient presents with   ??? Gastroesophageal Reflux        History of Present Illness:   Brenda [name] is a 56 y.o. female who presents for follow up. GERD. Meds cause diarrhea. Taking Zantac bid and now OTC PPI. Some loose stools . Taking Peptobismol. Main issue is memory and thirst +++ which is attributed to the Risperidone. Didn't wish to undergo the EGD. Delayed the UGI.       Past Medical History:  She has a past medical history of Asthma; Environmental allergies; Superficial injury of face without infection (04/12/2009); Hematoma, left eyebrow (04/18/2009); Huntington's chorea (08/11/2010); Esophageal reflux (08/10/2010); Osteopenia (08/10/2010); Fracture of left orbit (2010); Anxiety; Fatigue (05/24/2011); and Chronic sinusitis (05/24/2011).    Medications:  Current outpatient prescriptions:calcium carbonate-vitamin D3 (CALCIUM 600 + D,3,) 600 mg(1,500mg ) -200 unit Tab, Take 1 tablet by mouth daily., Disp: , Rfl: ;  calcium carbonate-vitamin D3 (CALCIUM+D) 400-133.3 mg-unit Tab, Take 1 tablet by mouth 2 times a day., Disp: , Rfl: ;  ciclesonide (ALVESCO) 160 mcg/actuation inhaler, Inhale 1 puff into the lungs 2 times a day. , Disp: , Rfl:   ciclesonide (ALVESCO) 160 mcg/actuation inhaler, Inhale 1 puff into the lungs every 12 hours., Disp: , Rfl: ;  ciprofloxacin (CIPRO) 250 MG tablet, Take 1 tablet (250 mg total) by mouth 2 times a day., Disp: 10 tablet, Rfl: 0;  clonazePAM (KLONOPIN) 0.5 MG tablet, Take 1 tablet (0.5 mg total) by mouth 2 times a day., Disp: 60 tablet, Rfl: 5;  clonazePAM (KLONOPIN) 1 MG tablet, Take 1 mg by mouth 4 times daily., Disp: , Rfl:   coenzyme Q10 200 mg capsule, Take 800 mg by mouth 2 times daily., Disp: , Rfl: ;  coenzyme Q10 400 mg Cap, Take 800 mg by mouth 2 times a day., Disp: , Rfl: ;  ibuprofen (ADVIL,MOTRIN) 600 MG tablet, Take 600 mg by mouth every 6 hours as needed., Disp: , Rfl: ;  levalbuterol (XOPENEX) 0.63 mg/3 mL nebulizer solution, Inhale 1 ampule by  nebulization every 6 hours as needed., Disp: , Rfl:   loratadine (CLARITIN) 10 mg tablet, Take 10 mg by mouth daily as needed., Disp: , Rfl: ;  loratadine (CLARITIN) 10 mg tablet, Take 10 mg by mouth daily., Disp: , Rfl: ;  multivitamin (THERAGRAN) tablet, Take 1 tablet by mouth daily., Disp: , Rfl: ;  MULTIVITAMIN ORAL, Take 1 tablet by mouth daily. , Disp: , Rfl: ;  omega-3 fatty acids (FISH OIL) 500 mg Cap, Take 1 tablet by mouth 2 times a day., Disp: , Rfl:   omega-3 fatty acids-fish oil (FISH OIL) 340-1,000 mg Cap, Take 1,000 mg by mouth daily., Disp: , Rfl: ;  omeprazole (PRILOSEC) 20 MG capsule, Take 1 capsule (20 mg total) by mouth 2 times a day., Disp: 60 capsule, Rfl: 6;  ranitidine (ZANTAC) 150 MG tablet, Take 1 tablet (150 mg total) by mouth 2 times a day., Disp: 60 tablet, Rfl: 3  risperiDONE (RISPERDAL) 0.25 MG tablet, Take 1 tablet (0.25 mg total) by mouth 2 times a day., Disp: 60 tablet, Rfl: 11;  risperiDONE (RISPERDAL) 0.5 MG tablet, Take 0.5 mg by mouth 2 times daily., Disp: , Rfl:     Allergies:  Zithromax; Bactrim; Erythromycin; Erythromycin base; and Sulfa (sulfonamide antibiotics)    Family History:  Her family history includes Alzheimer's disease in her father; Anxiety disorder in her sister; Depression in her sister; Huntington's disease in her maternal uncle; Huntington's disease (age  of onset:60) in her mother; Pulmonary embolism in her mother; and Suicidality in her sister.    Past Surgical History:  She has past surgical history that includes Sinus surgery and Tonsillectomy.    Social History:  She reports that she has never smoked. She has never used smokeless tobacco. She reports that she drinks about .6 ounces of alcohol per week. She reports that she does not use illicit drugs.    Review of Systems:  * See scanned Review of Systems sheet.    Vital Signs:  Blood pressure 95/64, pulse 94, height 5' 3 (1.6 m), weight 129 lb (58.514 kg).  Physical Exam  CONSTITUTIONAL: Alert, well  developed, well nourished  SKIN: Normal, no rashes  HEAD: Grossly symmetrical  ENT: Pharynx, dentition and tongue normal  EYES: No icterus; normal inspection of eyes and lids  NECK: Supple, no masses, nodes, no thyromegaly  CARDIOVASCULAR: Normal rate, rhythm, no murmurs, no lower extremity edema, pulses generally equal  RESPIRATORY: No respiratory distress, good air entry, no extra sounds  GASTROINTESTINAL: Abdomen, soft, non tender, no rebound, non-distended, no masses or overt fluid, no organomegaly, no hernias, bowel sounds normal  RECTAL EXAM: Not performed  MUSCULOSKELETAL: Grossly normal, no tenderness, generally normal range of motion, nails generally OK  NEUROLOGICAL:  nil localizing, cn ii-xii grossly intact; chorae  PSYCHIATRIC: Memory issues, anxiety  OTHER: Nil pertinent  Review of Lab Results:  Lab Results   Component Value Date    WBC 6.1 06/02/2011    HGB 14.3 06/02/2011    HCT 42.3 06/02/2011    MCV 92.0 06/02/2011    CREATININE 0.8 06/02/2011    BUN 13 06/02/2011    NA 144 06/02/2011    K 4.1 06/02/2011    CL 104 06/02/2011    CO2 32 06/02/2011    ALT 36 06/02/2011    AST 39* 06/02/2011    ALKPHOS 65 06/02/2011    BILITOT 0.40 06/02/2011      Assessment & Plan:  56 y.o. female who presents for follow up. GERD. Meds cause diarrhea. Taking Zantac bid and now OTC PPI. Some loose stools . Taking Peptobismol. Main issue is memory and thirst +++ which is attributed to the Risperidone. Didn't wish to undergo the EGD. Delayed the UGI.    LOng discussion with repeat recs. Husband present. Will continue witgh meds for GERD. Will undergo UGI - Agreeable to holding liquids for 2 hrs. See after . Long discussion re: various strategies for the heartburn - although doesn't seem too bothersome. Suspect chorea-related dysmotility, even med-related gastroparesis.

## 2011-12-15 NOTE — Unmapped (Signed)
Clarify two rx for  1mg  and Med Peds [facility] Form   87 to 56 years of age     Are you the primary care giver for the patient: Yes    Development   Do you have any concerns about:   Your child's hearing No  Your child's vision: No  Your child's development: No  Your child's school performance: No  Child care: No  Does your child like to read: Yes  My child spends more than 10 hours per week in front of a screen (TV, hand held games or computer): No      Behavior   Do you:   Have concerns about your child's behavior: No  Know how to use the time out technique: Yes  Use this to control your child: Yes  Use spanking and or physical punishment to control your child: No  Is your child toilet trained: Yes  Having accidents: No      Nutrition   Are you concerned about your child's diet or weight: No  Does your child:   Drink at least 3 cups of milk or other calcium enriched foods or drinks per day:  A cup of yogurt and 2 slices of cheese are counted as a cup of milk as well.Yes  Have a picky diet: No  Drink soda, juice or other sugary drinks: No  Eat 5 servings of fruits and vegetables per day: Yes  Eat sugary snacks on a daily basis: No  Drink caffeine in soda or other fluids: No      Injury prevention   Is your child in a booster seat or car seat (required until age 68 & recommended until 80 pounds): Yes  Does your child ride in the front seat of the car: No  Is there water near your home: No  Can your child swim: Yes  If your child is riding a bike, skateboarding, or rollerblading does he/she ALWAYS wear a helmet: Yes  Does he/she need a helmet: No  Is your child using a trampoline: No  Does your child ride on an ATV: No  Are there firearms (guns) at home: No  Is your child exposed to anyone who smokes: No  Do you have smoke detectors: Yes  Were they tested in the last 6 months: Yes  Do you have questions about how to make your home safer for your child: No  Can you say yes to any of the following lead risks: No  ---We live in  housing built before 1960, have lead pipes, peeling or chipped paint, recent renovations, have other children or neighborhood kids with lead problems, or any reason to suspect lead poisoning?      Dental   Are your child's teeth being brushed at least twice a day: Yes  did your child see the dentist in the last 6 months: Yes  Does your child suck his/her thumb or still use a bottle or pacifier at night: No  Does your child have cavities: No  Do you have city water: Yes      Vaccines   Did your child have any problems with his/her last shots: No      Age related development questions:  Does your child:   Like to tell jokes: Yes  Draw people: Yes  Speak so that everyone can understand him/her: Yes  Have any speech problems: No  Count to 10: Yes  Know the alphabet: Yes  Seem ready for school in the coming  year: Yes      Does your child:   Have friends: Yes  Have trouble separating from you: No  Color and draw: Yes  Able to write his or her name: Yes  Know his or her last name and telephone number: Yes  Name all body parts: Yes  Know at least 4 colors: Yes    Is your child:   Starting to ride a bike: Yes  Jumping rope: Yes  Able to walk on tip toe: Yes    Have you discussed:   Complex danger such as how to react if strangers approach them: Yes  Inappropriate touching: Yes

## 2011-12-15 NOTE — Unmapped (Signed)
Called and verified RX's with pharmacy.  On 5/15 Holly called in RX for 1 mg bid of klonopin, patient already had one on file for .5 mgs klonopin one bid.  Will forward to Dr. Reather Converse for clarification as I don't see where this med was increased.  Risperidone was increased at this time in notes only.

## 2011-12-15 NOTE — Unmapped (Signed)
Clarify 2 rx's for clonazepam

## 2011-12-16 NOTE — Unmapped (Signed)
Called [facility] pharmacy , only refill risperidone, will try [facility]

## 2011-12-17 NOTE — Unmapped (Signed)
Pt taking .5mg  at bedtime of risperidone per Dr. Reather Converse

## 2011-12-17 NOTE — Unmapped (Signed)
Called and spoke to pt husband, explained medication management and scheduled follow up appt.

## 2011-12-28 ENCOUNTER — Ambulatory Visit: Admit: 2011-12-28 | Discharge: 2011-12-28 | Payer: PRIVATE HEALTH INSURANCE | Attending: Neurology

## 2011-12-28 DIAGNOSIS — G10 Huntington's disease: Secondary | ICD-10-CM

## 2011-12-28 MED ORDER — risperiDONE (RISPERDAL) 0.25 MG tablet
0.25 | ORAL_TABLET | Freq: Every evening | ORAL | Status: AC
Start: 2011-12-28 — End: 2012-12-27

## 2011-12-28 NOTE — Unmapped (Signed)
Subjective:      Patient ID: Brenda [name] is a 56 y.o. female.    HPI   She is here for a follow-up visit with her husband.  Since our last visit she was seen by Dr. Barbaraann Share at [facility].  She provided several recommendations in her note including possibly adding something such as Depakote, Topamax, lithium or tegretol for OCD symptoms.  She is followed by Dr Corinne Ports in psychiatry and we will have her discuss these possibilities with her.   She is taking risperidone 0.25 mg BID.  She says it has especially helped with her breathing and chest tightness in the past. She says it also makes her feel content and relaxed and has decreased her chorea.  She feels it makes her somnolent during the day since she started taking the morning dose.  We said she could try taking all of her risperidone at bedtime to combat any somnolence it may be causing but she has not made this change yet. She felt risperidone was causing her to have excessive thirst in the past but says this is getting better.  She takes clonazepam 0.5 mg at 5 pm and 10 pm (more recently takes this at 9 pm and 11pm).  She says her hyper or manic feelings during the day are improved.  She is drinking more caffeine for somnolence. She denies any significant chorea. She continues to exercise regularly.     Her initial history follows: she first noticed racy, speedy spells in December 2010. These spells consist of her mind racing, she starts speaking, her muscles get tight. Initially she thought these spells were due to taking steroids (for asthma). Shortly afterwards she noticed generalized involuntary movements. She describes them as twitches. She started dropping objects, since mid 2011. She denies difficulty walking, but she admits that her balance can be off at times. In November 2011 she was rocking in a chair and she fell backwards with the whole chair. In 2010 she was trying to step over a dog gate and she fell and hit her face; she suffered  a left orbital fracture. She has noticed that she is bumping into things while walking. She still walks and plays golf. She denies hallucinations, delusions, lightheadedness, fainting spells, daytime sedation, nausea or vomiting. She denies memory problems. She admits occasional feelings of depression, especially in the winter. She has tried sertraline in the past as well as escitalopram but stopped these previously due to side effects.   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 She discontinued sertraline in the past. She did not feel the need to start tetrabenazine, partially because it would be difficult to get.       Quality Metrics  Falls (Review Each Visit): denies     Depression (Review Annually): present     Cognitive Impairment (Review Annually): present     Insomnia/Sleep Disturbance (Review Annually): denies              Histories:     She has a past medical history of Asthma; Environmental allergies; Superficial injury of face without infection (04/12/2009); Hematoma, left eyebrow (04/18/2009); Huntington's chorea (08/11/2010); Esophageal reflux (08/10/2010); Osteopenia (08/10/2010); Fracture of left orbit (2010); Anxiety; Fatigue (05/24/2011); and Chronic sinusitis (05/24/2011).    She has past surgical history that includes Sinus surgery and Tonsillectomy.    Her family history includes Alzheimer's disease in her father; Anxiety disorder in her sister; Depression in her sister; Huntington's disease  in her maternal uncle; Huntington's disease (age of onset:60) in her mother; Pulmonary embolism in her mother; and Suicidality in her sister.    She reports that she has never smoked. She has never used smokeless tobacco. She reports that she drinks about .6 ounces of alcohol per week. She reports that she does not use illicit drugs.      Review of Systems: As in HPI    Allergies:   Zithromax; Bactrim; Erythromycin; Erythromycin base; and Sulfa (sulfonamide  antibiotics)    Medications:     Outpatient Encounter Prescriptions as of 12/28/2011   Medication Sig Dispense Refill   ??? clonazePAM (KLONOPIN) 0.5 MG tablet Take 1 tablet (0.5 mg total) by mouth 2 times a day.  60 tablet  5   ??? coenzyme Q10 400 mg Cap Take 800 mg by mouth 2 times a day.       ??? ibuprofen (ADVIL,MOTRIN) 600 MG tablet Take 600 mg by mouth every 6 hours as needed.       ??? multivitamin (THERAGRAN) tablet Take 1 tablet by mouth daily.       ??? omega-3 fatty acids (FISH OIL) 500 mg Cap Take 1 tablet by mouth 2 times a day.       ??? omeprazole (PRILOSEC) 20 MG capsule Take 40 mg by mouth daily.       ??? ranitidine (ZANTAC) 150 MG tablet Take 1 tablet (150 mg total) by mouth 2 times a day.  60 tablet  3   ??? risperiDONE (RISPERDAL) 0.25 MG tablet Take 1 tablet (0.25 mg total) by mouth 2 times a day.  60 tablet  11        Objective:       Blood pressure 113/52, pulse 88, height 5' 3 (1.6 m), weight 129 lb (58.514 kg).    Neurologic Exam    Physical Exam    UHDRS 1: Motor Examination  Huntington's Disease Rating Scale  Ocular pursuit, horizontal: 1  Ocular pursuit, vertical: 1  Saccade initiation, horizontal: 2  Saccade initiation, vertical: 0  Saccade velocity, hortizontal : 1  Saccade velocity, vertical: 1  Dysarthria : 1  Tongue Protrusion: 0  Fingers taps, right: 1  Fingers taps, left : 1  Pronate/supinate hands, right : 0  Pronate/supinate hands, left : 0  Luria (fist-hand-palm test): 0  Rigidity arms, right : 1  Rigidity arms, left: 1  Body Bradykinesia: 1  Maximal dystonia, trunk: 0  Maximal dystonia, RUE: 0  Maximal dystonia, LUE: 0  Maximal dystonia, RLE: 1  Maximal dystonia, LLE: 1  Maximal chorea, face: 1  Maixmal chorea, BOL: 0  Maximal chorea, trunk: 1   Maximal chorea, RUE: 1  Maximal chorea, LUE: 1  Maximal chorea, RLE: 1  Maximal chorea, LLE: 1  Gait: 1  Tandem Walking: 1  Retropulsion Pull Test: 0  Diagnosis confidence level: 4  UHDRS 1 TOTAL:: 22            Assessment:     1. Huntington's  chorea    2. Anxiety      She has family history of HD, and her history and examination are consistent with a clinical diagnosis of Huntington's disease, progressively worse over time. 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  She is taking variable doses of clonazepam which has helped anxiety and chorea overall.  We recommended she take this at scheduled times throughout the day however she is taking  now in the evenings only due to somnolence.  Risperidone has helped with depression, anxiety, and chorea overall however she has had side effects of dry mouth and somnolence.  Some of these side effects she obsesses about.   We will try to give her risperidone all at bedtime (0.5 mg total) to see if this helps with somnolence.  She took sertraline and escitalopram in the past but these made her feel manic.  She should continue to follow-up with Dr. Corinne Ports in psychiatry.   Regular stretching exercises were recommended. I extensively discussed these issues the patient and called her husband on the phone (left a message for him to call me back), and answered all her questions      Plan:     1. Change risperidone to 0.5 mg at bedtime.  2. Continue clonazepam 0.5 mg BID (roughly at 5 pm and 11 pm).  3. Regular exercise program. 4. Continue to follow w/ Psychiatry. Will allow them to decide whether to add a medication for OCD behaviors as per Dr. Cyndia Bent recommendations.  5. Discussed participating in [employer] [facility] and she is interested in this.  6. . Follow-up in 4 months.

## 2011-12-31 NOTE — Unmapped (Signed)
The patient stated the domperidone is making her extremely sleepy and and she does not want to get out of bed. Could she decrease to 1 1/2 ? If you can not reach her today please call her after 12:00 on Monday and she will keep taking this until she hears from you.

## 2011-12-31 NOTE — Unmapped (Signed)
Pt stated she is too tired all day from increase in risperidone, trying some caffienated drinks, taking medication at 6 pm, would like to stay consistent for one week,  Hates feeling so out of , wanted to know if she should still go to psychiatrist apppt next week

## 2011-12-31 NOTE — Unmapped (Signed)
I will let Jeanice Lim talk to this pt.

## 2011-12-31 NOTE — Unmapped (Signed)
We just changed risperidone from 0.25 mg BID to 0.5 qhs on 6/25 due to patient's somnolence.  I would give this more time to see if giving medication just at night helps with daytime sleepiness before we change anything.

## 2011-12-31 NOTE — Unmapped (Signed)
Talked to pt and calling her back monday

## 2011-12-31 NOTE — Unmapped (Signed)
Please contact pt to sch an UGI

## 2012-01-03 ENCOUNTER — Encounter

## 2012-01-03 NOTE — Unmapped (Signed)
Spoke to husband.  Patient was already scheduled and was at the appt at the time of my phone call.

## 2012-01-04 NOTE — Unmapped (Signed)
I saw and examined the patient.  I discussed with Dr. Pecina and agree with her findings and plan as documented in the note.  Saquan Furtick J Sereen Schaff, MD

## 2012-01-10 NOTE — Unmapped (Signed)
Patient had Barium swallow at [facility] 01/03/2012, please call [phone] with results

## 2012-01-12 NOTE — Unmapped (Signed)
Discussed results with patient. Also reviewed best regimen for reflux medication dosing. Addressed her concerns regarding PPI's longterm. She will continue using Ranitidine prn for breakthru symptoms.

## 2012-02-01 ENCOUNTER — Encounter

## 2012-02-02 MED ORDER — ranitidine (ZANTAC) 150 MG tablet
150 | ORAL_TABLET | Freq: Three times a day (TID) | ORAL | Status: AC
Start: 2012-02-02 — End: 2012-07-31

## 2012-02-05 LAB — COMPREHENSIVE METABOLIC PANEL
ALT: 38 U/L (ref 10–40)
AST: 33 U/L (ref 15–37)
Albumin/Globulin Ratio: 1.5 (ref 1.1–2.2)
Albumin: 4.6 g/dL (ref 3.4–5.0)
Alkaline Phosphatase: 81 U/L (ref 45–129)
BUN: 16 mg/dL (ref 7–18)
CO2: 30 mEq/L (ref 21–32)
Calcium: 9.8 mg/dL (ref 8.3–10.6)
Chloride: 103 mEq/L (ref 99–110)
Creatinine: 0.8 mg/dL (ref 0.6–1.1)
GFR African American: >60 (ref >60)
GFR Non-African American: >60 (ref >60)
Globulin: 3 g/dL
Glucose: 81 mg/dL (ref 70–99)
Potassium: 4.2 mEq/L (ref 3.5–5.1)
Sodium: 141 mEq/L (ref 136–145)
Total Bilirubin: 0.5 mg/dL (ref 0.00–1.00)
Total Protein: 7.6 g/dL (ref 6.4–8.2)

## 2012-02-05 LAB — LIPID PANEL
Cholesterol, Total: 208 mg/dL — ABNORMAL HIGH (ref 0–199)
HDL: 73 mg/dL — ABNORMAL HIGH (ref 40–60)
LDL Calculated: 122 mg/dL — ABNORMAL HIGH (ref 0–99)
Triglycerides: 64 mg/dL (ref 0–149)
VLDL Cholesterol Calculated: 13 mg/dL

## 2012-02-06 LAB — VITAMIN D 25 HYDROXY: Vit D, 25-Hydroxy: 34 ng/mL (ref 30–80)

## 2012-02-10 NOTE — Unmapped (Signed)
lmtcb

## 2012-02-10 NOTE — Unmapped (Signed)
Is wanting to talk to you about Risperidone medication.

## 2012-02-14 NOTE — Unmapped (Signed)
lmtcb

## 2012-02-14 NOTE — Unmapped (Signed)
Pt called to say she is starting to get mild chorea, will discuss more at follow up

## 2012-02-17 NOTE — Progress Notes (Signed)
Subjective:      Patient ID: Danielle Lawrence is a 57 y.o. female.    HPI Feeling thirsty over the past 6 months off and on. Drinking a lot of water. Concerned  Feels r/t risperidone. This was prescribed by Neurologist at [facility]. Let them know about it and was advised to drink more water and gatoraide. Next appointment is in October. States she does not want to change medication now because she is traveling to [place] in September and it takes a few weeks to adjust to the medication.    Review of Systems   Constitutional: Positive for fatigue. Negative for fever and chills.   HENT: Negative.    Respiratory: Negative for cough and shortness of breath.    Cardiovascular: Negative for chest pain and palpitations.   Gastrointestinal: Negative for nausea and diarrhea.   Endocrine: Positive for polydipsia and polyuria.   Genitourinary: Negative for dysuria and frequency.   Musculoskeletal: Negative for myalgias and arthralgias.   Neurological: Negative.  Negative for headaches.   Hematological: Negative.    Psychiatric/Behavioral: Negative.        Objective:   Physical Exam   Constitutional: She is oriented to person, place, and time. She appears well-developed and well-nourished. No distress.   HENT:   Head: Normocephalic and atraumatic.   Neck: Normal range of motion. Neck supple. No thyromegaly present.   Cardiovascular: Normal rate, regular rhythm and normal heart sounds.    Pulmonary/Chest: Effort normal and breath sounds normal. No respiratory distress. She has no wheezes. She has no rales.   Abdominal: Soft. Bowel sounds are normal. She exhibits no distension and no mass. There is no tenderness. There is no rebound and no guarding.   Musculoskeletal: Normal range of motion. She exhibits no edema and no tenderness.   Neurological: She is alert and oriented to person, place, and time. She exhibits abnormal muscle tone. Coordination abnormal.   Somewhat erratic movement, almost in constant motion. Alert, mild flight of ideas  however very obsessed with drinking water and applying lip protection. Difficulty focusing on questions.    Skin: Skin is warm and dry.   Lips c skin intact, unable to appreciate bleeding. Skin non peeling.       Assessment:      1. hunington's chorea  2. Possible medication reaction  3. Anxiety      Plan:    1. Reviewed labs from 02/05/12-normal  2. Discussed her obsession c water. Reassured no indication of dehydration on assessment today. Recent labs also do no indicate dehydration.  3. Strongly recommend consultation with neurologist for medication adjustment and evaluation.   4. Given no indication of emesis or diarrhea, recommend water rather than gatorade and hydration drinks  5. Suggested using vasoline on lips   6. Discussed altitude change involved with her trip to [place]. This could be on additional concern.

## 2012-02-17 NOTE — Patient Instructions (Signed)
1. Reviewed labs from 02/05/12-normal 2. Discussed her obsession c water. Reassured no indication of dehydration on assessment today. Recent labs also do no indicate dehydration.  3. Strongly recommend consultation with neurologist for medication adjustment and evaluation.   4. Given no indication of emesis or diarrhea, recommend water rather than gatorade and hydration drinks  5. Suggested using vasoline on lips   6. Discussed altitude change involved with her trip to [place]. This could be on additional concern.

## 2012-02-23 NOTE — Unmapped (Signed)
Pt still having dehydration and would like to decrease risperidone to 1.5 tablets to possibly help with the side effect. Will fwd to Dr. Reather Converse for advise, pt going to see Dr Natasha Bence on the 26th, can wait til then to adjust medication.

## 2012-02-24 NOTE — Unmapped (Signed)
Called and lm not to make any changes until after visit with Dr. Natasha Bence

## 2012-03-03 NOTE — Unmapped (Signed)
Pt wanting some advice from MA. Would like a call.

## 2012-03-07 NOTE — Unmapped (Signed)
lmtcb

## 2012-03-09 NOTE — Unmapped (Signed)
Pt called and informed us of new medication taking.

## 2012-03-22 NOTE — Progress Notes (Signed)
Subjective:      Patient ID: Danielle Lawrence is a 56 y.o. female.    HPI    Here for H&P.  Feels her Huntingtons has improved since starting risperdl but is having significant dryness in her mouth and cracking of her lips and perioral area.  Drinks water and Gatorade all day long.  SE seem to be improving and she plans on taking the medication for another month    Past Medical History, Medications, Social History, Family History, Health Maintenance and Labs have been reviewed with Danielle Lawrence.        Review of Systems  Denies cp/sob/pnd/le edema or new neuro complaints  Objective:   Physical Exam  gen : nad, difficulty with some word finding, occasional choreoid movements  Heent: marked dryness and cracking of perioral skin and lips; op clear; B tms clear  Neck without thyromegally or mass  Heart with nl s1,s2  Lungs CTA B with no w/r/r/  Abdomin is soft and nt  Ext without edema  Neuro: cn 2-12 intact.  Slightly ataxic gait  Assessment:     well exam:  utd on health maintenance. Continue current meds.  F/u with neurology    Flu vaccine today   Plan:

## 2012-03-27 NOTE — Telephone Encounter (Signed)
Pt had some questions at last visit for a Living Will.  LM to discuss.      Sela Hilding MSN, RN  RN Care Coordinator

## 2012-03-27 NOTE — Unmapped (Signed)
Pt is stating she is having side effects from Risperdal. Is wanting to discuss this with you.

## 2012-03-28 NOTE — Unmapped (Signed)
Talked to the pt and she says that Risperdal is making her sleepy and tired.   I explained that she was on risperdal for awhile and shouldn't still be trying to get use to it.   She wants to make an appt for Oct. I told her that Jeanice Lim will have to call her and double book.   She will discuss the risperdal when she comes in for an appt.

## 2012-03-29 NOTE — Telephone Encounter (Signed)
Pt returned call.  Has questions about living will.  Faxed [employer] and Power of Attorney forms to patient.      PLAN:  - Determine if appropriate to follow or if Living Will is only need    Sela Hilding MSN, RN  RN Care Coordinator

## 2012-03-29 NOTE — Unmapped (Signed)
Is calling again regarding previous message.

## 2012-03-29 NOTE — Unmapped (Signed)
Scheduled sooner appt

## 2012-03-31 ENCOUNTER — Ambulatory Visit: Admit: 2012-03-31 | Discharge: 2012-03-31 | Payer: PRIVATE HEALTH INSURANCE | Attending: Neurology

## 2012-03-31 DIAGNOSIS — G10 Huntington's disease: Secondary | ICD-10-CM

## 2012-03-31 MED ORDER — clonazePAM (KLONOPIN) 1 MG tablet
1 | ORAL_TABLET | Freq: Four times a day (QID) | ORAL | Status: AC
Start: 2012-03-31 — End: 2012-05-19

## 2012-03-31 MED ORDER — valproic acid (DEPAKENE) 250 mg capsule
250 | ORAL_CAPSULE | Freq: Two times a day (BID) | ORAL | 0.00 refills | 30.00000 days | Status: AC
Start: 2012-03-31 — End: 2013-05-07

## 2012-03-31 NOTE — Unmapped (Signed)
Subjective:      Patient ID: Brenda [name] is a 56 y.o. female.    HPI   Movement Disorders HPI  She is here for a follow-up visit with her husband. She has a constant feeling of dry mouth. She calls them thirsty spells. She drinks a lot of fluids, iced tea, and Gatorade. She kept repeating the same topic throughout the visit. She was evaluated by a urologist, she started taking Myrbetriq.   From previous visits: in the past she was seen by Dr. Barbaraann Share at [facility]. She provided several recommendations in her note including possibly adding something such as Depakote, Topamax, lithium or tegretol for OCD symptoms. She is followed by Dr Corinne Ports in psychiatry and we will have her discuss these possibilities with her. She is taking risperidone 0.25 mg BID. She says it has especially helped with her breathing and chest tightness in the past. She says it also makes her feel content and relaxed and has decreased her chorea. She feels it makes her somnolent during the day since she started taking the morning dose. We said she could try taking all of her risperidone at bedtime to combat any somnolence it may be causing but she has not made this change yet. She felt risperidone was causing her to have excessive thirst in the past but says this is getting better. She takes clonazepam 0.5 mg at 5 pm and 10 pm (more recently takes this at 9 pm and 11pm). She says her hyper or manic feelings during the day are improved. She is drinking more caffeine for somnolence. She denies any significant chorea. She continues to exercise regularly.   Her initial history follows: she first noticed racy, speedy spells in December 2010. These spells consist of her mind racing, she starts speaking, her muscles get tight. Initially she thought these spells were due to taking steroids (for asthma). Shortly afterwards she noticed generalized involuntary movements. She describes them as twitches. She started dropping objects, since  mid 2011. She denies difficulty walking, but she admits that her balance can be off at times. In November 2011 she was rocking in a chair and she fell backwards with the whole chair. In 2010 she was trying to step over a dog gate and she fell and hit her face; she suffered a left orbital fracture. She has noticed that she is bumping into things while walking. She still walks and plays golf. She denies hallucinations, delusions, lightheadedness, fainting spells, daytime sedation, nausea or vomiting. She denies memory problems. She admits occasional feelings of depression, especially in the winter. She has tried sertraline in the past as well as escitalopram but stopped these previously due to side effects. 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 She discontinued sertraline in the past. She did not feel the need to start tetrabenazine, partially because it would be difficult to get.     PD Quality Metrics  Falls (Review Each Visit): denies           Depression (Review Annually): present     Cognitive Impairment (Review Annually): present           Insomnia/Sleep Disturbance (Review Annually): denies          Histories:     She has a past medical history of Asthma; Environmental allergies; Superficial injury of face without infection (04/12/2009); Hematoma, left eyebrow (04/18/2009); Huntington's chorea (08/11/2010); Esophageal reflux (08/10/2010); Osteopenia (08/10/2010); Fracture of left orbit (2010); Anxiety; Fatigue (  05/24/2011); and Chronic sinusitis (05/24/2011).    She has past surgical history that includes Sinus surgery and Tonsillectomy.    Her family history includes Alzheimer's disease in her father; Anxiety disorder in her sister; Depression in her sister; Huntington's disease in her maternal uncle; Huntington's disease (age of onset:60) in her mother; Pulmonary embolism in her mother; and Suicidality in her sister.    She reports that she has never smoked. She has never  used smokeless tobacco. She reports that she drinks about .6 ounces of alcohol per week. She reports that she does not use illicit drugs.      Review of Systems: as per HPI    Allergies:   Zithromax; Bactrim; Erythromycin; Erythromycin base; and Sulfa (sulfonamide antibiotics)    Medications:     Outpatient Encounter Prescriptions as of 03/31/2012   Medication Sig Dispense Refill   ??? clonazePAM (KLONOPIN) 0.5 MG tablet Take 1 tablet (0.5 mg total) by mouth 2 times a day.  60 tablet  5   ??? coenzyme Q10 400 mg Cap Take 800 mg by mouth 2 times a day.       ??? ibuprofen (ADVIL,MOTRIN) 600 MG tablet Take 600 mg by mouth every 6 hours as needed.       ??? mirabegron (MYRBETRIQ) 25 mg Tb24 Take 25 mg by mouth daily.       ??? multivitamin (THERAGRAN) tablet Take 1 tablet by mouth daily.       ??? omega-3 fatty acids (FISH OIL) 500 mg Cap Take 1 tablet by mouth 2 times a day.       ??? omeprazole (PRILOSEC) 20 MG capsule Take 40 mg by mouth daily.       ??? ranitidine (ZANTAC) 150 MG tablet Take 1 tablet (150 mg total) by mouth 3 times a day. Take  by mouth.  90 tablet  3   ??? risperiDONE (RISPERDAL) 0.25 MG tablet Take 2 tablets (0.5 mg total) by mouth at bedtime.  180 tablet  3        Objective:       Blood pressure 144/64, pulse 83, height 5' 3 (1.6 m), weight 133 lb (60.328 kg).    Neurologic Exam    Physical Exam  UHDRS 1: Motor Examination  Huntington's Disease Rating Scale  Ocular pursuit, horizontal: 1  Ocular pursuit, vertical: 1  Saccade initiation, horizontal: 2  Saccade initiation, vertical: 1  Saccade velocity, hortizontal : 1  Saccade velocity, vertical: 1  Dysarthria : 1  Tongue Protrusion: 0  Fingers taps, right: 1  Fingers taps, left : 1  Pronate/supinate hands, right : 0  Pronate/supinate hands, left : 0  Luria (fist-hand-palm test): 2  Rigidity arms, right : 1  Rigidity arms, left: 1  Body Bradykinesia: 1  Maximal dystonia, trunk: 0  Maximal dystonia, RUE: 0  Maximal dystonia, LUE: 0  Maximal dystonia, RLE:  1  Maximal dystonia, LLE: 1  Maximal chorea, face: 1  Maixmal chorea, BOL: 0  Maximal chorea, trunk: 2   Maximal chorea, RUE: 1  Maximal chorea, LUE: 1  Maximal chorea, RLE: 2  Maximal chorea, LLE: 2  Gait: 1  Tandem Walking: 1  Retropulsion Pull Test: 0  Diagnosis confidence level: 4  UHDRS 1 TOTAL:: 28      Assessment:     1. Huntington's chorea      She has family history of HD, and her history and examination are consistent with a clinical diagnosis of Huntington's disease, progressively worse over  time. 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 She is taking clonazepam, which has helped anxiety and chorea overall. We recommended to increase the dose again, observing carefully for side effects. Risperidone has helped with depression, anxiety, and chorea overall however she has had side effects of dry mouth and somnolence. Some of these side effects (in particular the dry mouth, for which she is drinking a lot of fluids) she obsesses about. She took sertraline and escitalopram in the past but these made her feel manic. She should continue to follow-up with Dr. Corinne Ports in psychiatry. Today I recommended to start Depakote 250 mg bid, which was one of the recommendations by Dr. Barbaraann Share, for obsessive symptoms. Regular stretching exercises were recommended again. I extensively discussed these issues the patient and her husband, and answered all her questions    Plan:   1. Continue risperidone 0.5 mg at bedtime. 2. Increase clonazepam to 1 mg qid as needed. 3. Regular exercise program. 4. Continue to follow with Psychiatry. 5. Start Depakote 250 mg bid. 6. Follow-up in 4 months.

## 2012-04-03 MED ORDER — omeprazole (PRILOSEC) 40 MG capsule
40 | ORAL_CAPSULE | ORAL | Status: AC
Start: 2012-04-03 — End: 2012-04-17

## 2012-04-06 NOTE — Unmapped (Signed)
Pt would like a referral to a psychiatrist.

## 2012-04-07 NOTE — Unmapped (Signed)
Checking with Delfin Gant Social Worker,will call monday

## 2012-04-10 ENCOUNTER — Ambulatory Visit (INDEPENDENT_AMBULATORY_CARE_PROVIDER_SITE_OTHER)
Admission: RE | Admit: 2012-04-10 | Discharge: 2012-04-10 | Disposition: A | Discharge status: Home / Self Care | Payer: 59 | Source: Ambulatory Visit | Attending: Internal Medicine | Admitting: Internal Medicine

## 2012-04-10 ENCOUNTER — Encounter: Payer: Self-pay | Admitting: Internal Medicine

## 2012-04-10 ENCOUNTER — Other Ambulatory Visit (INDEPENDENT_AMBULATORY_CARE_PROVIDER_SITE_OTHER): Payer: 59

## 2012-04-10 ENCOUNTER — Ambulatory Visit (INDEPENDENT_AMBULATORY_CARE_PROVIDER_SITE_OTHER): Payer: 59 | Admitting: Internal Medicine

## 2012-04-10 VITALS — BP 128/82 | HR 85 | Temp 98.6°F | Ht 66.0 in | Wt 240.0 lb

## 2012-04-10 DIAGNOSIS — M533 Sacrococcygeal disorders, not elsewhere classified: Secondary | ICD-10-CM

## 2012-04-10 DIAGNOSIS — H2010 Chronic iridocyclitis, unspecified eye: Secondary | ICD-10-CM

## 2012-04-10 DIAGNOSIS — Z1231 Encounter for screening mammogram for malignant neoplasm of breast: Secondary | ICD-10-CM

## 2012-04-10 DIAGNOSIS — G8929 Other chronic pain: Secondary | ICD-10-CM

## 2012-04-10 DIAGNOSIS — M545 Low back pain, unspecified: Secondary | ICD-10-CM | POA: Insufficient documentation

## 2012-04-10 DIAGNOSIS — Z0000 Encounter for general adult medical examination without abnormal findings: Secondary | ICD-10-CM

## 2012-04-10 DIAGNOSIS — J309 Allergic rhinitis, unspecified: Secondary | ICD-10-CM

## 2012-04-10 DIAGNOSIS — Z1239 Encounter for other screening for malignant neoplasm of breast: Secondary | ICD-10-CM

## 2012-04-10 LAB — BASIC METABOLIC PANEL
BUN: 12 mg/dL (ref 6–23)
Chloride: 104 mEq/L (ref 96–112)
Creatinine, Ser: 0.9 mg/dL (ref 0.4–1.2)
Glucose, Bld: 84 mg/dL (ref 70–99)
Potassium: 4.2 mEq/L (ref 3.5–5.1)

## 2012-04-10 LAB — URINALYSIS, ROUTINE W REFLEX MICROSCOPIC
Bilirubin Urine: NEGATIVE
Hgb urine dipstick: NEGATIVE
Ketones, ur: NEGATIVE
Leukocytes, UA: NEGATIVE
Urobilinogen, UA: 0.2 (ref 0.0–1.0)

## 2012-04-10 LAB — CBC WITH DIFFERENTIAL/PLATELET
Basophils Absolute: 0 10*3/uL (ref 0.0–0.1)
Eosinophils Absolute: 0.1 10*3/uL (ref 0.0–0.7)
HCT: 43.6 % (ref 36.0–46.0)
Lymphs Abs: 2.3 10*3/uL (ref 0.7–4.0)
MCV: 82 fl (ref 78.0–100.0)
Monocytes Absolute: 0.6 10*3/uL (ref 0.1–1.0)
Neutrophils Relative %: 56.8 % (ref 43.0–77.0)
Platelets: 214 10*3/uL (ref 150.0–400.0)
RDW: 14 % (ref 11.5–14.6)

## 2012-04-10 LAB — HEPATIC FUNCTION PANEL
ALT: 19 U/L (ref 0–35)
Alkaline Phosphatase: 65 U/L (ref 39–117)
Bilirubin, Direct: 0.1 mg/dL (ref 0.0–0.3)
Total Bilirubin: 0.6 mg/dL (ref 0.3–1.2)
Total Protein: 7.6 g/dL (ref 6.0–8.3)

## 2012-04-10 LAB — LIPID PANEL: HDL: 84.7 mg/dL (ref >39.00)

## 2012-04-10 LAB — TSH: TSH: 0.99 u[IU]/mL (ref 0.35–5.50)

## 2012-04-10 LAB — SEDIMENTATION RATE: Sed Rate: 14 mm/hr (ref 0–22)

## 2012-04-10 MED ORDER — PREDNISONE (PAK) 10 MG PO TABS: 10.0000 mg | ORAL_TABLET | ORAL | Status: DC

## 2012-04-10 NOTE — Assessment & Plan Note (Signed)
Current symptoms staple without recent flare on maintenance steroid therapy Continue working with ophthalmology as ongoing

## 2012-04-10 NOTE — Assessment & Plan Note (Signed)
Reports chronic, mild symptoms, different than acute sacral pain over past 30 days Send for release of information from prior PCP Continue pain management as ongoing, no changes recommended today

## 2012-04-10 NOTE — Addendum Note (Signed)
Addended by: Rene Paci A on: 04/10/2012 08:35 PM   Modules accepted: Orders

## 2012-04-10 NOTE — Assessment & Plan Note (Signed)
Check plain films and labs to evaluate for possible ankylosing spondylitis or psoriatic changes given coexisting chronic uveitis Intolerant of NSAIDs due to hives, consider steroid taper if elevated sedimentation rate and refer to rheumatology as needed. Continue tramadol, hydrocodone and muscle relaxants as needed

## 2012-04-10 NOTE — Progress Notes (Signed)
Subjective:    Patient ID: Chelsea Werner, female    DOB: Sep 07, 1955, 56 y.o.   MRN: [id number]  HPI  New patient to me and our practice, here to establish care - patient is here today for annual physical. Patient feels well overall.  Also concerned about lower back and tailbone pain Onset new pain symptoms approximately one month ago Gradually progressive symptoms, denies precipitating injury or trauma, no falls Chronic lumbar back pain different than current symptoms Current symptoms exacerbated by direct pressure such as sitting or reclining Denies history of prior sacral pain Radiation into bilateral SI joints but not into leg Little relief with medication therapy as ongoing for plantar fasciitis surgery pain (tramadol, Norco and Valium)  Also reviewed chronic medical issues: Chronic iritis. Onset 2008 -follows with ophthalmology regularly for same and control symptoms with daily steroid drops -s/p second opinion evaluation at [facility] in 2009, reports negative for secondary cause -including false reports of TB, status post three-month treatment with difficulty at [facility]  Chronic low back pain. Related to arthritic changes. Uses Lidoderm as needed   Past Medical History  Diagnosis Date  . History of chicken pox   . Hx of migraines   . Allergic rhinitis   . Hyperlipidemia   . Positive TB test   . Thyroid disease   . History of blood transfusion   . Chronic low back pain   . Iritis, chronic    Family History  Problem Relation Age of Onset  . Early death Mother   . Hypertension Father   . Cancer Other     thyroid cancer-other relative   History  Substance Use Topics  . Smoking status: Never Smoker   . Smokeless tobacco: Never Used  . Alcohol Use: Yes    Review of Systems Constitutional: Negative for fever or weight change.  Respiratory: Negative for cough and shortness of breath.   Cardiovascular: Negative for chest pain or palpitations.    Gastrointestinal: Negative for abdominal pain, no bowel changes.  Musculoskeletal: Negative for gait problem or joint swelling.  Skin: Negative for rash.  Neurological: Negative for dizziness or headache.  No other specific complaints in a complete review of systems (except as listed in HPI above).     Objective:   Physical Exam BP 128/82  Pulse 85  Temp 98.6 F (37 C) (Oral)  Ht 5\' 6"  (1.676 m)  Wt 240 lb (108.863 kg)  BMI 38.74 kg/m2  SpO2 95% Wt Readings from Last 3 Encounters:  04/10/12 240 lb (108.863 kg)   Constitutional: She is overweight, but appears well-developed and well-nourished. No distress.  HENT: Head: Normocephalic and atraumatic. Ears: B TMs ok, no erythema or effusion; Nose: Nose normal. Mouth/Throat: Oropharynx is clear and moist. No oropharyngeal exudate.  Eyes: Conjunctivae and EOM are normal. Pupils are equal, round, and reactive to light. No scleral icterus.  Neck: Thick. Normal range of motion. Neck supple. No JVD present. No thyromegaly present.  Cardiovascular: Normal rate, regular rhythm and normal heart sounds.  No murmur heard. No BLE edema. Pulmonary/Chest: Effort normal and breath sounds normal. No respiratory distress. She has no wheezes.  Abdominal: Soft. Bowel sounds are normal. She exhibits no distension. There is no tenderness. no masses Musculoskeletal: Back: full range of motion of thoracic and lumbar spine. Non tender to palpation. Negative straight leg raise. DTR's are symmetrically intact. Sensation intact in all dermatomes of the lower extremities. Full strength to manual muscle testing. patient is able to heel toe  walk without difficulty and ambulates with antalgic gait. Gluteal fold examined and no evidence of cyst, erythema or him but tender over palpation of sacrum  Neurological: She is alert and oriented to person, place, and time. No cranial nerve deficit. Coordination normal.  Skin: Skin is warm and dry. No rash noted. No erythema.   Psychiatric: She has a normal mood and affect. Her behavior is normal. Judgment and thought content normal.   No results found for this basename: WBC, HGB, HCT, PLT, GLUCOSE, CHOL, TRIG, HDL, LDLDIRECT, LDLCALC, ALT, AST, NA, K, CL, CREATININE, BUN, CO2, TSH, PSA, INR, GLUF, HGBA1C, MICROALBUR       Assessment & Plan:  CPX/v70.0 - Patient has been counseled on age-appropriate routine health concerns for screening and prevention. These are reviewed and up-to-date. Immunizations are up-to-date or declined. Labs and ECG reviewed.  Also See problem list. Medications and labs reviewed today.

## 2012-04-10 NOTE — Patient Instructions (Signed)
It was good to see you today. We have reviewed your prior records including labs and tests today we will send to your prior provider(s) for "release of records" as discussed today -  Health Maintenance reviewed - all recommended immunizations and age-appropriate screenings are up-to-date. we'll make referral for mammogram screening . Our office will contact you regarding appointment(s) once made. Test(s) ordered today. Your results will be released to MyChart (or called to you) after review, usually within 72hours after test completion. If any changes need to be made, you will be notified at that same time. Medications reviewed, no changes at this time. Will consider other medication treatment for your low back pain, tailbone pain and headache after review of these results Please schedule followup in 3-4 months to continue review, call sooner if problems. Health Maintenance, Females A healthy lifestyle and preventative care can promote health and wellness.  Maintain regular health, dental, and eye exams.   Eat a healthy diet. Foods like vegetables, fruits, whole grains, low-fat dairy products, and lean protein foods contain the nutrients you need without too many calories. Decrease your intake of foods high in solid fats, added sugars, and salt. Get information about a proper diet from your caregiver, if necessary.   Regular physical exercise is one of the most important things you can do for your health. Most adults should get at least 150 minutes of moderate-intensity exercise (any activity that increases your heart rate and causes you to sweat) each week. In addition, most adults need muscle-strengthening exercises on 2 or more days a week.     Maintain a healthy weight. The body mass index (BMI) is a screening tool to identify possible weight problems. It provides an estimate of body fat based on height and weight. Your caregiver can help determine your BMI, and can help you achieve or maintain  a healthy weight. For adults 20 years and older:   A BMI below 18.5 is considered underweight.   A BMI of 18.5 to 24.9 is normal.   A BMI of 25 to 29.9 is considered overweight.   A BMI of 30 and above is considered obese.   Maintain normal blood lipids and cholesterol by exercising and minimizing your intake of saturated fat. Eat a balanced diet with plenty of fruits and vegetables. Blood tests for lipids and cholesterol should begin at age 12 and be repeated every 5 years. If your lipid or cholesterol levels are high, you are over 50, or you are a high risk for heart disease, you may need your cholesterol levels checked more frequently. Ongoing high lipid and cholesterol levels should be treated with medicines if diet and exercise are not effective.   If you smoke, find out from your caregiver how to quit. If you do not use tobacco, do not start.   If you are pregnant, do not drink alcohol. If you are breastfeeding, be very cautious about drinking alcohol. If you are not pregnant and choose to drink alcohol, do not exceed 1 drink per day. One drink is considered to be 12 ounces (355 mL) of beer, 5 ounces (148 mL) of wine, or 1.5 ounces (44 mL) of liquor.   Avoid use of street drugs. Do not share needles with anyone. Ask for help if you need support or instructions about stopping the use of drugs.   High blood pressure causes heart disease and increases the risk of stroke. Blood pressure should be checked at least every 1 to 2 years. Ongoing high  blood pressure should be treated with medicines, if weight loss and exercise are not effective.   If you are 3 to 55 years old, ask your caregiver if you should take aspirin to prevent strokes.   Diabetes screening involves taking a blood sample to check your fasting blood sugar level. This should be done once every 3 years, after age 66, if you are within normal weight and without risk factors for diabetes. Testing should be considered at a younger  age or be carried out more frequently if you are overweight and have at least 1 risk factor for diabetes.   Breast cancer screening is essential preventative care for women. You should practice "breast self-awareness." This means understanding the normal appearance and feel of your breasts and may include breast self-examination. Any changes detected, no matter how small, should be reported to a caregiver. Women in their 61s and 30s should have a clinical breast exam (CBE) by a caregiver as part of a regular health exam every 1 to 3 years. After age 49, women should have a CBE every year. Starting at age 49, women should consider having a mammogram (breast X-ray) every year. Women who have a family history of breast cancer should talk to their caregiver about genetic screening. Women at a high risk of breast cancer should talk to their caregiver about having an MRI and a mammogram every year.   The Pap test is a screening test for cervical cancer. Women should have a Pap test starting at age 16. Between ages 29 and 4, Pap tests should be repeated every 2 years. Beginning at age 56, you should have a Pap test every 3 years as long as the past 3 Pap tests have been normal. If you had a hysterectomy for a problem that was not cancer or a condition that could lead to cancer, then you no longer need Pap tests. If you are between ages 43 and 17, and you have had normal Pap tests going back 10 years, you no longer need Pap tests. If you have had past treatment for cervical cancer or a condition that could lead to cancer, you need Pap tests and screening for cancer for at least 20 years after your treatment. If Pap tests have been discontinued, risk factors (such as a new sexual partner) need to be reassessed to determine if screening should be resumed. Some women have medical problems that increase the chance of getting cervical cancer. In these cases, your caregiver may recommend more frequent screening and Pap  tests.   The human papillomavirus (HPV) test is an additional test that may be used for cervical cancer screening. The HPV test looks for the virus that can cause the cell changes on the cervix. The cells collected during the Pap test can be tested for HPV. The HPV test could be used to screen women aged 52 years and older, and should be used in women of any age who have unclear Pap test results. After the age of 40, women should have HPV testing at the same frequency as a Pap test.   Colorectal cancer can be detected and often prevented. Most routine colorectal cancer screening begins at the age of 69 and continues through age 42. However, your caregiver may recommend screening at an earlier age if you have risk factors for colon cancer. On a yearly basis, your caregiver may provide home test kits to check for hidden blood in the stool. Use of a small camera at the end  of a tube, to directly examine the colon (sigmoidoscopy or colonoscopy), can detect the earliest forms of colorectal cancer. Talk to your caregiver about this at age 49, when routine screening begins. Direct examination of the colon should be repeated every 5 to 10 years through age 4, unless early forms of pre-cancerous polyps or small growths are found.   Hepatitis C blood testing is recommended for all people born from [birth year] through 1964 and any individual with known risks for hepatitis C.   Practice safe sex. Use condoms and avoid high-risk sexual practices to reduce the spread of sexually transmitted infections (STIs). Sexually active women aged 53 and younger should be checked for Chlamydia, which is a common sexually transmitted infection. Older women with new or multiple partners should also be tested for Chlamydia. Testing for other STIs is recommended if you are sexually active and at increased risk.   Osteoporosis is a disease in which the bones lose minerals and strength with aging. This can result in serious bone fractures. The  risk of osteoporosis can be identified using a bone density scan. Women ages 53 and over and women at risk for fractures or osteoporosis should discuss screening with their caregivers. Ask your caregiver whether you should be taking a calcium supplement or vitamin D to reduce the rate of osteoporosis.   Menopause can be associated with physical symptoms and risks. Hormone replacement therapy is available to decrease symptoms and risks. You should talk to your caregiver about whether hormone replacement therapy is right for you.   Use sunscreen with a sun protection factor (SPF) of 30 or greater. Apply sunscreen liberally and repeatedly throughout the day. You should seek shade when your shadow is shorter than you. Protect yourself by wearing long sleeves, pants, a wide-brimmed hat, and sunglasses year round, whenever you are outdoors.   Notify your caregiver of new moles or changes in moles, especially if there is a change in shape or color. Also notify your caregiver if a mole is larger than the size of a pencil eraser.   Stay current with your immunizations.  Document Released: 01/04/2011 Document Revised: 09/13/2011 Document Reviewed: 01/04/2011 [facility] Patient Information 2013 [place], [place].

## 2012-04-11 ENCOUNTER — Other Ambulatory Visit: Payer: Self-pay | Admitting: *Deleted

## 2012-04-11 MED ORDER — PREDNISOLONE ACETATE 1 % OP SUSP: 1.0000 [drp] | OPHTHALMIC | Status: DC

## 2012-04-11 MED ORDER — LIDOCAINE 5 % EX PTCH: 1.0000 | MEDICATED_PATCH | CUTANEOUS | Status: DC

## 2012-04-11 MED ORDER — HYDROXYZINE HCL 25 MG PO TABS: 25.0000 mg | ORAL_TABLET | Freq: Three times a day (TID) | ORAL | Status: DC | PRN

## 2012-04-11 MED ORDER — HYDROCODONE-ACETAMINOPHEN 5-325 MG PO TABS: 1.0000 | ORAL_TABLET | Freq: Four times a day (QID) | ORAL | Status: DC | PRN

## 2012-04-11 NOTE — Telephone Encounter (Signed)
ok 

## 2012-04-11 NOTE — Telephone Encounter (Signed)
R'cd fax from [facility] for refill of Lidoderm patch, Hydroxyzine, Norco and Prednisolone. Please advise on Lidoderm and Norco.

## 2012-04-11 NOTE — Telephone Encounter (Signed)
Rx faxed to [facility].  

## 2012-04-14 NOTE — Unmapped (Signed)
Called and spoke with pt, she stated the depakote is causing her to have heartburn, will fwd to Louanna Raw NP for advice,

## 2012-04-14 NOTE — Unmapped (Signed)
patient called requesting refill of omeprazole-needs changed to 90 day mail order.  Last prescription per EMR:   Last office visit: 12/10/11  Next office visit: no pending  Pharmacy information confirmed and updated in prescription tab.

## 2012-04-14 NOTE — Unmapped (Signed)
Pt asking for a recommendation for psychiatrist in [place] for location reasons and also doesn't like current psychiatrist

## 2012-04-14 NOTE — Unmapped (Signed)
Pt stated she started taking Depakote 03/05/12 and she stated it gives her bad heart burn and would like something else to take instead because she is at her maximum with it.

## 2012-04-14 NOTE — Unmapped (Signed)
I called and spoke with the patient and told her to add some Tums.

## 2012-04-17 MED ORDER — omeprazole (PRILOSEC) 40 MG capsule
40 | ORAL_CAPSULE | Freq: Every day | ORAL | Status: AC
Start: 2012-04-17 — End: 2012-05-19

## 2012-04-17 NOTE — Telephone Encounter (Signed)
Patient informed.     Patient is taking the Prilosec and received another medication from GI

## 2012-04-17 NOTE — Telephone Encounter (Signed)
Needs call back regarding psychiatrist referrals, and would also like a medication for severe heartburn.

## 2012-04-17 NOTE — Telephone Encounter (Signed)
Ok to refer to psychiatry. Prilosec listed in epic. Is patient taking this medication?

## 2012-04-17 NOTE — Unmapped (Signed)
Spoke to her and taking Omeprazole 40mg  which generally controls symptoms but on new nero medication Valproic Acid. She has Ranitidine and wonders if OK to take both. Advised OK to take as 2nd dose if needed. To call and let us know how symptoms go in a week or so.

## 2012-04-17 NOTE — Unmapped (Signed)
Patient called, states zantac and omeprazole are not helping resolve heartburn, believes it is related to new medication neurologist put her on.  She would like to know if there is something stronger that can be called in for heartburn?  She can be reached at [phone].

## 2012-04-17 NOTE — Unmapped (Signed)
Pt feels like she is having side affects from Depakote.

## 2012-04-17 NOTE — Unmapped (Signed)
I called her back.  She is complaining of nausea, GERD, and diarrhea.  I told her it could be a virus and not necessarily side effects of the medicine.

## 2012-04-19 NOTE — Telephone Encounter (Signed)
i have spoke with patient numerous times on this issue, gave her several names and numbers. Patient was told to call insurance company to receive more physiatrist in the area.

## 2012-04-19 NOTE — Telephone Encounter (Signed)
Please refer

## 2012-04-19 NOTE — Telephone Encounter (Signed)
Tried call some of the psychiatrists you recommended, but is looking for a psychiatrist who has group counseling in this area as well.  Its OK if they don't take [employer] .  [phone]

## 2012-04-19 NOTE — Unmapped (Signed)
Pt wanting a referral to a psychologist.

## 2012-04-20 NOTE — Telephone Encounter (Signed)
Please see previous note.

## 2012-04-20 NOTE — Telephone Encounter (Signed)
patient would like you to call her back with the name of the group counseling and counselor that [facility] would not accept.  She will be changing insurance companies and will be able to use them.  [phone] or [phone]

## 2012-04-20 NOTE — Unmapped (Signed)
Called [facility] for referral, will call pt with names

## 2012-04-21 NOTE — Unmapped (Signed)
Pt is experiencing bad side effects from the med Risperdal, she has very dry mouth, and having spells, she stated the depakote med has her sleeping for days at a time, also her face and extremely chapped, and she is drinking a lot of water to help with the dehydrations.

## 2012-04-21 NOTE — Unmapped (Signed)
No answer

## 2012-04-26 ENCOUNTER — Telehealth: Payer: Self-pay | Admitting: Internal Medicine

## 2012-04-26 NOTE — Telephone Encounter (Signed)
Received 23 pages [facility]. Sent to dr. Felicity Coyer. 04/26/12/sd

## 2012-04-28 ENCOUNTER — Ambulatory Visit (HOSPITAL_COMMUNITY)
Admission: RE | Admit: 2012-04-28 | Discharge: 2012-04-28 | Disposition: A | Discharge status: Home / Self Care | Payer: 59 | Source: Ambulatory Visit | Attending: Internal Medicine | Admitting: Internal Medicine

## 2012-04-28 DIAGNOSIS — Z1231 Encounter for screening mammogram for malignant neoplasm of breast: Secondary | ICD-10-CM | POA: Insufficient documentation

## 2012-04-28 NOTE — Unmapped (Signed)
Pt wanting a call from

## 2012-05-02 ENCOUNTER — Encounter: Payer: PRIVATE HEALTH INSURANCE | Attending: Neurology

## 2012-05-02 NOTE — Unmapped (Signed)
Spoke with Pt. States she has gotten use to the Risperdal. No further issues with the med. It was clonazepam that was making her so tired, Dr. Reduced it to help. Pt. Is feeling better and will call with any other issues.

## 2012-05-05 ENCOUNTER — Other Ambulatory Visit: Payer: Self-pay | Admitting: Internal Medicine

## 2012-05-05 ENCOUNTER — Telehealth: Payer: Self-pay | Admitting: Internal Medicine

## 2012-05-05 DIAGNOSIS — M533 Sacrococcygeal disorders, not elsewhere classified: Secondary | ICD-10-CM

## 2012-05-05 DIAGNOSIS — R928 Other abnormal and inconclusive findings on diagnostic imaging of breast: Secondary | ICD-10-CM

## 2012-05-05 DIAGNOSIS — G8929 Other chronic pain: Secondary | ICD-10-CM

## 2012-05-05 NOTE — Telephone Encounter (Signed)
Caller: Chelsea Werner/Patient; Patient Name: Chelsea Werner; PCP: Rene Paci (Adults only); Best Callback Phone Number: [phone], patient states completed Prednisone given last office visit 04/10/2012 2 weeks ago for tailbone pain, xrays were done, pain has returned ~ 4 days ago, now worse. Not using pain medications at this time. Guideline: Back Symptoms, denies change in symptoms, worse with sitting.  Disposition: See within 72 hours. Requests call for referral as discussed or other recommendations rather than office visit. Please call.

## 2012-05-05 NOTE — Telephone Encounter (Signed)
Referral to orthopedics done

## 2012-05-05 NOTE — Telephone Encounter (Signed)
Pt advised and will expect a call from [facility] with appt date and time.  

## 2012-05-11 ENCOUNTER — Other Ambulatory Visit: Payer: 59

## 2012-05-15 NOTE — Telephone Encounter (Signed)
Danielle Lawrence  05/15/2012                Nurse Care Coordinator Progress Note    Assessment: [name] is a 56 y.o. female.  TC to pt.  Pt received information on living will and plans to discuss this information with specialist and family.  Pt had questions about Psychiatrist and Therapist.  Encouraged pt to stay with current Psych as there are not many options in the city for Psychiatry.  Pt is happy with current Pscyh but was hoping for a provider that had therapy in the city.      Goals    None        Barriers to meeting goals: impairment:  h[place]     Action:  - List sent with therapist and support groups    Plan of Care:  Continue Telephonic follow up with Home Visits as needed      Sela Hilding  Nurse Care Coordinator

## 2012-05-17 ENCOUNTER — Other Ambulatory Visit: Payer: Self-pay | Admitting: *Deleted

## 2012-05-17 MED ORDER — LORATADINE-PSEUDOEPHEDRINE ER 10-240 MG PO TB24: 1.0000 | ORAL_TABLET | Freq: Every day | ORAL | Status: DC

## 2012-05-17 MED ORDER — LORATADINE-PSEUDOEPHEDRINE ER 5-120 MG PO TB12: 1.0000 | ORAL_TABLET | Freq: Every day | ORAL | Status: DC

## 2012-05-17 NOTE — Telephone Encounter (Signed)
Received fax stating pt was previously on claritin D 24 hour. Requesting rx for 24 hour pls...Raechel Chute

## 2012-05-17 NOTE — Telephone Encounter (Signed)
Pt left vm needing rx for claritin d sent to [facility]. She states pharmacy said they have fax office haven't received response. Called pt back inform her we haven't received refill request but will send claritin into pharmacy...Raechel Chute

## 2012-05-19 ENCOUNTER — Ambulatory Visit: Admit: 2012-05-19 | Discharge: 2012-05-19 | Payer: PRIVATE HEALTH INSURANCE | Attending: Neurology

## 2012-05-19 ENCOUNTER — Ambulatory Visit
Admission: RE | Admit: 2012-05-19 | Discharge: 2012-05-19 | Disposition: A | Discharge status: Home / Self Care | Payer: 59 | Source: Ambulatory Visit | Attending: Internal Medicine | Admitting: Internal Medicine

## 2012-05-19 DIAGNOSIS — R928 Other abnormal and inconclusive findings on diagnostic imaging of breast: Secondary | ICD-10-CM

## 2012-05-19 DIAGNOSIS — G10 Huntington's disease: Secondary | ICD-10-CM

## 2012-05-19 NOTE — Unmapped (Signed)
Subjective:      Patient ID: Brenda [name] is a 56 y.o. female.    HPI   Movement Disorders HPI  She is here for a follow-up visit with her husband. She is overall improved, now taking a regular dose of risperidone 0.5 mg qhs. She tried Depakote 250 mg bid after the last visit, but she suffered side effects (diarrhea). After discussing with Dr. Natasha Bence, she stopped the medication. She is taking risperidone 0.5 mg at bedtime. She has decreased the clonazepam to 1 mg tid as needed after advice from Dr. Natasha Bence, since she was suffering from sleepiness during the day.   At the last visit, she has a constant feeling of dry mouth. She calls them thirsty spells. She drinks a lot of fluids, iced tea, and Gatorade. She kept repeating the same topic throughout the visit. However, now, she reports that the dry mouth has decreased on its own, but it has not gone.   From previous visits: in the past she was seen by Dr. Barbaraann Share at [facility]. She provided several recommendations in her note including possibly adding something such as Depakote, Topamax, lithium or tegretol for OCD symptoms. She is followed by Dr Corinne Ports in psychiatry and we will have her discuss these possibilities with her. She is taking risperidone 0.25 mg BID. She says it has especially helped with her breathing and chest tightness in the past. She says it also makes her feel content and relaxed and has decreased her chorea. She feels it makes her somnolent during the day since she started taking the morning dose. We said she could try taking all of her risperidone at bedtime to combat any somnolence it may be causing but she has not made this change yet. She felt risperidone was causing her to have excessive thirst in the past but says this is getting better. She takes clonazepam 0.5 mg at 5 pm and 10 pm (more recently takes this at 9 pm and 11pm). She says her hyper or manic feelings during the day are improved. She is drinking more caffeine for  somnolence. She denies any significant chorea. She continues to exercise regularly.   Her initial history follows: she first noticed racy, speedy spells in December 2010. These spells consist of her mind racing, she starts speaking, her muscles get tight. Initially she thought these spells were due to taking steroids (for asthma). Shortly afterwards she noticed generalized involuntary movements. She describes them as twitches. She started dropping objects, since mid 2011. She denies difficulty walking, but she admits that her balance can be off at times. In November 2011 she was rocking in a chair and she fell backwards with the whole chair. In 2010 she was trying to step over a dog gate and she fell and hit her face; she suffered a left orbital fracture. She has noticed that she is bumping into things while walking. She still walks and plays golf. She denies hallucinations, delusions, lightheadedness, fainting spells, daytime sedation, nausea or vomiting. She denies memory problems. She admits occasional feelings of depression, especially in the winter. She has tried sertraline in the past as well as escitalopram but stopped these previously due to side effects. 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 She discontinued sertraline in the past. She did not feel the need to start tetrabenazine, partially because it would be difficult to get.       PD Quality Metrics  Falls (Review Each Visit): denies  Depression (Review Annually): present     Cognitive Impairment (Review Annually): present           Insomnia/Sleep Disturbance (Review Annually): denies          Histories:     She has a past medical history of Asthma; Environmental allergies; Superficial injury of face without infection (04/12/2009); Hematoma, left eyebrow (04/18/2009); Huntington's chorea (08/11/2010); Esophageal reflux (08/10/2010); Osteopenia (08/10/2010); Fracture of left orbit (2010); Anxiety;  Fatigue (05/24/2011); and Chronic sinusitis (05/24/2011).    She has past surgical history that includes Sinus surgery and Tonsillectomy.    Her family history includes Alzheimer's disease in her father; Anxiety disorder in her sister; Depression in her sister; Huntington's disease in her maternal uncle; Huntington's disease (age of onset: 40) in her mother; Pulmonary embolism in her mother; and Suicidality in her sister.    She reports that she has never smoked. She has never used smokeless tobacco. She reports that she drinks about 0.6 ounces of alcohol per week. She reports that she does not use illicit drugs.    Review of Systems: as per HPI    Allergies:   Zithromax; Bactrim; Erythromycin; Erythromycin base; and Sulfa (sulfonamide antibiotics)    Medications:     Outpatient Encounter Prescriptions as of 05/19/2012   Medication Sig Dispense Refill   ??? clonazePAM (KLONOPIN) 1 MG tablet Take 1 mg by mouth 3 times a day as needed.       ??? coenzyme Q10 400 mg Cap Take 1,200 mg by mouth 2 times a day.        ??? ibuprofen (ADVIL,MOTRIN) 600 MG tablet Take 600 mg by mouth every 6 hours as needed.       ??? mirabegron (MYRBETRIQ) 25 mg Tb24 Take 25 mg by mouth daily.       ??? multivitamin (THERAGRAN) tablet Take 1 tablet by mouth daily.       ??? omega-3 fatty acids (FISH OIL) 500 mg Cap Take 1 tablet by mouth 2 times a day.       ??? omeprazole (PRILOSEC) 40 MG capsule Take 40 mg by mouth 2 times a day.       ??? ranitidine (ZANTAC) 150 MG tablet Take 1 tablet (150 mg total) by mouth 3 times a day. Take  by mouth.  90 tablet  3   ??? risperiDONE (RISPERDAL) 0.25 MG tablet Take 2 tablets (0.5 mg total) by mouth at bedtime.  180 tablet  3   ??? [DISCONTINUED] clonazePAM (KLONOPIN) 1 MG tablet Take 1 tablet (1 mg total) by mouth 4 times a day.  120 tablet  5   ??? [DISCONTINUED] omeprazole (PRILOSEC) 40 MG capsule Take 1 capsule (40 mg total) by mouth daily.  90 capsule  3   ??? valproic acid (DEPAKENE) 250 mg capsule Take 1 capsule (250  mg total) by mouth 2 times a day.  60 capsule  11   ??? [DISCONTINUED] clonazePAM (KLONOPIN) 0.5 MG tablet Take 0.5 mg by mouth 4 times a day.       ??? [DISCONTINUED] coenzyme Q10 200 mg capsule Take 1,200 mg by mouth 2 times daily.       ??? [DISCONTINUED] omeprazole (PRILOSEC) 20 MG capsule Take 40 mg by mouth daily.         No facility-administered encounter medications on file as of 05/19/2012.      Objective:       Blood pressure 127/73, pulse 77, height 5' 3 (1.6 m), weight 130 lb (58.968  kg).    Neurologic Exam    Physical Exam  UHDRS 1: Motor Examination  Huntington's Disease Rating Scale  Ocular pursuit, horizontal: 1  Ocular pursuit, vertical: 1  Saccade initiation, horizontal: 1  Saccade initiation, vertical: 1  Saccade velocity, hortizontal : 1  Saccade velocity, vertical: 1  Dysarthria : 1  Tongue Protrusion: 0  Fingers taps, right: 2  Fingers taps, left : 2  Pronate/supinate hands, right : 1  Pronate/supinate hands, left : 1  Luria (fist-hand-palm test): 2  Rigidity arms, right : 0  Rigidity arms, left: 0  Body Bradykinesia: 1  Maximal dystonia, trunk: 0  Maximal dystonia, RUE: 1  Maximal dystonia, LUE: 1  Maximal dystonia, RLE: 0  Maximal dystonia, LLE: 0  Maximal chorea, face: 0  Maixmal chorea, BOL: 0  Maximal chorea, trunk: 1   Maximal chorea, RUE: 1  Maximal chorea, LUE: 1  Maximal chorea, RLE: 1  Maximal chorea, LLE: 1  Gait: 1  Tandem Walking: 1  Retropulsion Pull Test: 0  Diagnosis confidence level: 4  UHDRS 1 TOTAL:: 25      Assessment:     1. Huntington's chorea      She has family history of HD, and her history and examination are consistent with a clinical diagnosis of Huntington's disease, progressively worse over time. 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 She is taking clonazepam, which has helped anxiety and chorea overall. We recommended to increase the dose again, observing carefully for side effects. Risperidone has helped with  depression, anxiety, and chorea overall however she has had side effects of dry mouth and somnolence. Some of these side effects (in particular the dry mouth, for which she is drinking a lot of fluids) she obsesses about. She took sertraline and escitalopram in the past but these made her feel manic. She should continue to follow-up with Dr. Corinne Ports in psychiatry. Today I recommended to start Depakote 250 mg bid, which was one of the recommendations by Dr. Barbaraann Share, for obsessive symptoms. Regular stretching exercises were recommended again. I extensively discussed these issues the patient and her husband, and answered all her questions    Plan:   1. Continue risperidone 0.5 mg at bedtime; and consider a higher dose in the future (her husband will call us if symptoms worsen). 2. Same dose of clonazepam as needed. 3. Regular exercise program. 4. Continue to follow with Psychiatry. 5. Follow-up in 3 months.

## 2012-06-06 ENCOUNTER — Other Ambulatory Visit (HOSPITAL_COMMUNITY): Payer: Self-pay | Admitting: Orthopedic Surgery

## 2012-06-06 DIAGNOSIS — M5418 Radiculopathy, sacral and sacrococcygeal region: Secondary | ICD-10-CM

## 2012-06-08 ENCOUNTER — Ambulatory Visit (HOSPITAL_COMMUNITY)
Admission: RE | Admit: 2012-06-08 | Discharge: 2012-06-08 | Disposition: A | Discharge status: Home / Self Care | Payer: 59 | Source: Ambulatory Visit | Attending: Orthopedic Surgery | Admitting: Orthopedic Surgery

## 2012-06-08 DIAGNOSIS — M5418 Radiculopathy, sacral and sacrococcygeal region: Secondary | ICD-10-CM

## 2012-06-08 DIAGNOSIS — IMO0002 Reserved for concepts with insufficient information to code with codable children: Secondary | ICD-10-CM | POA: Insufficient documentation

## 2012-06-08 DIAGNOSIS — M533 Sacrococcygeal disorders, not elsewhere classified: Secondary | ICD-10-CM | POA: Insufficient documentation

## 2012-06-21 NOTE — Unmapped (Signed)
She would like to speak to you about medications.

## 2012-06-22 NOTE — Unmapped (Signed)
Returning call.

## 2012-06-22 NOTE — Unmapped (Signed)
lmtcb

## 2012-06-22 NOTE — Unmapped (Signed)
Spoke with pt, will fwd to Dr. Heywood Footman, and Dr. Reather Converse to consult with psychiatrist regarding adding lithium, told pt we wont be able to call back until after the Holidays, she is OK with that.

## 2012-06-22 NOTE — Unmapped (Signed)
Ret call to MA.

## 2012-07-03 NOTE — Telephone Encounter (Signed)
Nurse Care Coordinator Quarterly Summary    NAVI EWTON  July 03, 2012    Initial Referral Date: 03/27/2012   Initial Referral Reason: Huntington's Chorea, Assistance with Living Will    Patient Care Team:  [name], MD as PCP - General  Sela Hilding, RN as Case Manager (Care Coordinator)    History of Present Illness: Pt is a 56 yo female with Huntington's Chorea, Anxiety.  Pt was referred for help with a living will with the intent of not being placed on this [facility] Care Coordination list permanently.  However, after speaking with this pt it was determined that pt should stay on the list for the short term in case new needs arise.  Pt has been struggling with this diagnosis and has been hoping to begin therapy for her anxiety and mood.  Her current psychiatrist does not offer therapy services, so pt was provided a list of therapists in the [place] are.  Pt was also referred to the Huntington's support group in the [place] area.          Frequency of patient contact: monthly      Interpretation of Total Score Total Score Depression Severity: 1-4 = Minimal depression, 5-9 = Mild depression, 10-14 = Moderate depression, 15-19 = Moderately severe depression, 20-27 = Severe depression    Goals     ??? Engage with Therapist or Support Group         Barriers to meeting goals: impairment:  Huntington's Chorea    Initial PAM score: Needs to be completed  Current PAM score: Needs to be completed    Outcome:   -  Pt sent referrals for therapist and support group  - Pt given resources for living will     Plan of Care:   -  Discharge pt if no additional needs   -  Continue to support as needed.        Sela Hilding   Nurse Care Coordinator

## 2012-07-07 NOTE — Unmapped (Signed)
Pt is dry and chapped all over from med and very emotional

## 2012-07-10 ENCOUNTER — Other Ambulatory Visit: Payer: Self-pay | Admitting: Internal Medicine

## 2012-07-10 NOTE — Unmapped (Signed)
Discuss med being denied by ins

## 2012-07-10 NOTE — Unmapped (Signed)
pls call and speak to pt regarding something to keep her awake more, and possibe lithium.

## 2012-07-10 NOTE — Unmapped (Signed)
Spoke to patient.  She had several questions and concerns.  We discussed all of her concerns.  She is wondering about HD studies.  I told her I would talk to Dr. Heywood Footman and someone would get back to her.

## 2012-07-12 ENCOUNTER — Ambulatory Visit: Payer: 59 | Admitting: Internal Medicine

## 2012-07-12 NOTE — Unmapped (Signed)
Enrolling in HD study, talked to Dr. Reather Converse

## 2012-07-17 ENCOUNTER — Encounter: Payer: Self-pay | Admitting: Internal Medicine

## 2012-07-17 ENCOUNTER — Ambulatory Visit (INDEPENDENT_AMBULATORY_CARE_PROVIDER_SITE_OTHER): Payer: 59 | Admitting: Internal Medicine

## 2012-07-17 VITALS — BP 112/80 | HR 117 | Temp 98.4°F | Ht 66.0 in | Wt 244.8 lb

## 2012-07-17 DIAGNOSIS — M533 Sacrococcygeal disorders, not elsewhere classified: Secondary | ICD-10-CM

## 2012-07-17 NOTE — Progress Notes (Signed)
  Subjective:    Patient ID: Chelsea Werner, female    DOB: 24-Dec-1955, 57 y.o.   MRN: [id number]  HPI  Here for follow up - reviewed chronic medical issues:  Tailbone pain - new symptoms 03/2012 - rheum labs unremarkable 04/2012 - working with ortho on same, MRI 06/2012 unremarkable -  Chronic iritis. Onset 2008 -follows with ophthalmology regularly for same and control symptoms with daily steroid drops -s/p second opinion evaluation at [facility] in 2009, reports negative for secondary cause -including false reports of TB, status post three-month treatment with [facility]  Chronic low back pain. Related to arthritic changes. Uses Lidoderm as needed   Past Medical History  Diagnosis Date  . History of chicken pox   . Hx of migraines   . Allergic rhinitis   . Hyperlipidemia   . Positive TB test   . Thyroid disease   . History of blood transfusion   . Chronic low back pain   . Iritis, chronic     Review of Systems  Respiratory: Negative for cough and shortness of breath.   Cardiovascular: Negative for chest pain or palpitations.      Objective:   Physical Exam  BP 112/80  Pulse 117  Temp 98.4 F (36.9 C) (Oral)  Ht 5\' 6"  (1.676 m)  Wt 244 lb 12.8 oz (111.041 kg)  BMI 39.51 kg/m2  SpO2 90% Wt Readings from Last 3 Encounters:  07/17/12 244 lb 12.8 oz (111.041 kg)  04/10/12 240 lb (108.863 kg)   Constitutional: She is overweight, but appears well-developed and well-nourished. No distress.  Neck: Thick. Normal range of motion. Neck supple. No JVD present. No thyromegaly present.  Cardiovascular: Normal rate, regular rhythm and normal heart sounds.  No murmur heard. No BLE edema. Pulmonary/Chest: Effort normal and breath sounds normal. No respiratory distress. She has no wheezes.  Musculoskeletal: deferred today Skin: Skin is warm and dry. No rash noted. No erythema.  Psychiatric: She has a normal mood and affect. Her behavior is normal. Judgment and  thought content normal.   Lab Results  Component Value Date   WBC 6.9 04/10/2012   HGB 14.3 04/10/2012   HCT 43.6 04/10/2012   PLT 214.0 04/10/2012   GLUCOSE 84 04/10/2012   CHOL 233* 04/10/2012   TRIG 66.0 04/10/2012   HDL 84.70 04/10/2012   LDLDIRECT 122.8 04/10/2012   ALT 19 04/10/2012   AST 23 04/10/2012   NA 138 04/10/2012   K 4.2 04/10/2012   CL 104 04/10/2012   CREATININE 0.9 04/10/2012   BUN 12 04/10/2012   CO2 27 04/10/2012   TSH 0.99 04/10/2012       Assessment & Plan:   See problem list. Medications and labs reviewed today.

## 2012-07-17 NOTE — Assessment & Plan Note (Signed)
Working with ortho on same neg plain films, MRI 06/2012 and labs> no evidence for ankylosing spondylitis or psoriatic changes despite coexisting chronic uveitis Intolerant of NSAIDs due to hives -but rx'd diclofenac by ortho and reports doing well periodic steroid taper as needed -consider local injections. Continue tramadol, hydrocodone and muscle relaxants as needed

## 2012-07-17 NOTE — Patient Instructions (Signed)
It was good to see you today. We have reviewed your prior records including labs and tests today. Medications reviewed and updated, no changes at this time. Please schedule followup in 12 months for annual medical physical and labs, call sooner if problems.

## 2012-07-20 NOTE — Unmapped (Signed)
Pt dry and chapped all over is getting a little better - pt asking about energy drinks, she is losing energy thru the day

## 2012-07-21 NOTE — Unmapped (Signed)
Called and spoke to pt, reccommended several energy drinks, she stated she is also drinking some caffeine during the day.

## 2012-07-26 NOTE — Telephone Encounter (Signed)
LM    PLAN:  - Determine if pt has any outstanding needs  - Pt given information on therapists and support groups previously.  Ensure no other needs.    - DC may be appropriate if no other needs arise  - Will need PHQ-9 and PAM if pt is to stay on list.      Sela Hilding MSN, RN  RN Care Coordinator

## 2012-07-31 MED ORDER — ranitidine (ZANTAC) 150 MG tablet
150 | ORAL_TABLET | ORAL | Status: AC
Start: 2012-07-31 — End: 2012-10-10

## 2012-08-04 NOTE — Unmapped (Signed)
Pt wanting to speak with MA regarding tightness.

## 2012-08-07 NOTE — Unmapped (Signed)
Talked to the pt and she stated that she has some muscle tightness. Mainly her legs and back. I advised her to do the daily stretching exercises. She said that she is doing that and going out with friends 1 time a week and walking in the mall or places like that. I confirmed with her the appt 08/11/12 at 12:00 with Dr. Heywood Footman and told her that she can discuss the problems with him at the appt.   She agreed and understood.

## 2012-08-11 ENCOUNTER — Ambulatory Visit: Payer: PRIVATE HEALTH INSURANCE | Attending: Neurology

## 2012-09-06 NOTE — Unmapped (Signed)
Pt wants to know since Dr Natasha Bence is out of the office ,she would like to know if Dr Heywood Footman can refill her Ritalin medication when she comes to the appt next week? Please call pt and leave message if Dr Heywood Footman can

## 2012-09-07 NOTE — Unmapped (Signed)
Called to confirm Dr. Heywood Footman can write script for ritalin do to the fact that Dr. Corinne Ports is out of town.

## 2012-09-12 ENCOUNTER — Ambulatory Visit: Payer: PRIVATE HEALTH INSURANCE | Attending: Neurology

## 2012-09-12 NOTE — Unmapped (Signed)
Pt would like to speak with MA,

## 2012-09-12 NOTE — Unmapped (Signed)
Called and told pt Dr. Heywood Footman will speak to her regarding the risperidone during her study visit.

## 2012-09-12 NOTE — Unmapped (Signed)
She has not been happy with risperidone and would like to discuss this with Dr Heywood Footman. She had asked to see Dr Heywood Footman before study. I advised her he did not have anything available that day.

## 2012-09-13 NOTE — Unmapped (Signed)
She needs to speak to you about the changes in the risperiDONE (RISPERDAL) 0.25 MG tablet. She had some questions.

## 2012-09-19 NOTE — Unmapped (Signed)
Lmtcb, pt is out of town, will adjust meds when returns

## 2012-09-25 ENCOUNTER — Other Ambulatory Visit: Payer: Self-pay | Admitting: Internal Medicine

## 2012-09-25 NOTE — Care Coordination-Inpatient (Signed)
Left message for patient to return call to follow up on Care Coordination and referrals to psychologists for therapy or counseling, and to question if still needing assistance with Advance Directives.    Glennis Brink, RN   Care Coordinator

## 2012-10-02 ENCOUNTER — Ambulatory Visit (INDEPENDENT_AMBULATORY_CARE_PROVIDER_SITE_OTHER): Payer: 59 | Admitting: Internal Medicine

## 2012-10-02 ENCOUNTER — Encounter: Payer: Self-pay | Admitting: Internal Medicine

## 2012-10-02 ENCOUNTER — Other Ambulatory Visit (INDEPENDENT_AMBULATORY_CARE_PROVIDER_SITE_OTHER): Payer: 59

## 2012-10-02 VITALS — BP 122/72 | HR 82 | Temp 97.0°F | Wt 247.6 lb

## 2012-10-02 DIAGNOSIS — L608 Other nail disorders: Secondary | ICD-10-CM

## 2012-10-02 DIAGNOSIS — R609 Edema, unspecified: Secondary | ICD-10-CM

## 2012-10-02 DIAGNOSIS — J309 Allergic rhinitis, unspecified: Secondary | ICD-10-CM

## 2012-10-02 DIAGNOSIS — L609 Nail disorder, unspecified: Secondary | ICD-10-CM

## 2012-10-02 LAB — CBC WITH DIFFERENTIAL/PLATELET
Basophils Absolute: 0 10*3/uL (ref 0.0–0.1)
Lymphocytes Relative: 31.1 % (ref 12.0–46.0)
Lymphs Abs: 2.1 10*3/uL (ref 0.7–4.0)
Monocytes Relative: 11.2 % (ref 3.0–12.0)
Neutrophils Relative %: 56.1 % (ref 43.0–77.0)
Platelets: 224 10*3/uL (ref 150.0–400.0)
RDW: 14.2 % (ref 11.5–14.6)
WBC: 6.8 10*3/uL (ref 4.5–10.5)

## 2012-10-02 LAB — HEPATIC FUNCTION PANEL
ALT: 19 U/L (ref 0–35)
AST: 19 U/L (ref 0–37)
Albumin: 3.7 g/dL (ref 3.5–5.2)
Alkaline Phosphatase: 69 U/L (ref 39–117)
Total Protein: 6.9 g/dL (ref 6.0–8.3)

## 2012-10-02 LAB — BASIC METABOLIC PANEL
BUN: 14 mg/dL (ref 6–23)
CO2: 30 mEq/L (ref 19–32)
Glucose, Bld: 78 mg/dL (ref 70–99)
Potassium: 4.3 mEq/L (ref 3.5–5.1)
Sodium: 137 mEq/L (ref 135–145)

## 2012-10-02 LAB — TSH: TSH: 1.33 u[IU]/mL (ref 0.35–5.50)

## 2012-10-02 MED ORDER — FLUTICASONE PROPIONATE 50 MCG/ACT NA SUSP: 2.0000 | Freq: Every day | NASAL | Status: DC

## 2012-10-02 MED ORDER — CETIRIZINE-PSEUDOEPHEDRINE ER 5-120 MG PO TB12: 1.0000 | ORAL_TABLET | Freq: Two times a day (BID) | ORAL | Status: DC | PRN

## 2012-10-02 MED ORDER — FUROSEMIDE 20 MG PO TABS: 20.0000 mg | ORAL_TABLET | Freq: Every day | ORAL | Status: DC

## 2012-10-02 NOTE — Assessment & Plan Note (Signed)
Chronic symptoms - prev tx with hctz and lasix prn, now demadex, but requiring daily Reviewed weight gain (complicated by immobility with foot and leg pain /ortho issues) Check labs and echo - Continue diuretic prn, qd if needed

## 2012-10-02 NOTE — Patient Instructions (Signed)
It was good to see you today. We have reviewed your prior records including labs and tests today. Test(s) ordered today. Your results will be released to MyChart (or called to you) after review, usually within 72hours after test completion. If any changes need to be made, you will be notified at that same time. we'll make referral for ultrasound of your heart to look for problems causing swelling or rapid heart rate. Our office will contact you regarding appointment(s) once made. Medications reviewed and updated, change Allegra to Zyrtec and start Flonase nose spray daily for allergy symptoms Stop torsemide and resume Lasix daily for swelling Your prescription(s) have been submitted to your pharmacy. Please take as directed and contact our office if you believe you are having problem(s) with the medication(s).

## 2012-10-02 NOTE — Assessment & Plan Note (Signed)
Increase in seasonal symptoms Reports Claritin-D no longer effective Change type to Zyrtec D. 12 hour as needed and begin Flonase spray - erx done

## 2012-10-02 NOTE — Progress Notes (Signed)
  Subjective:    Patient ID: Chelsea Werner, female    DOB: [birth year], 57 y.o.   MRN: [id number]  HPI  Here for edema, B feet - chronic, takes loop diuretic prn, but requiring daily in recent weeks to control same Prev on hctz or furosemide - ?resume this diuretic as torsemide "not working as well" complains of sneezing and runny nose - "claritin not working any more" Also notes HR>100, but denies palpitations, shortness of breath or presyncope  Also reviewed chronic medical issues:  Tailbone pain - new symptoms 03/2012 - rheum labs unremarkable 04/2012 - working with ortho on same, MRI 06/2012 unremarkable -  Chronic iritis. Onset 2008 -follows with ophthalmology regularly for same and control symptoms with daily steroid drops -s/p second opinion evaluation at [facility] in 2009, reports negative for secondary cause -including false reports of TB, status post three-month treatment with difficulty at [facility]  Chronic low back pain. Related to arthritic changes. Uses Lidoderm as needed   Past Medical History  Diagnosis Date  . History of chicken pox   . Hx of migraines   . Allergic rhinitis   . Hyperlipidemia   . Positive TB test   . Thyroid disease   . History of blood transfusion   . Chronic low back pain   . Iritis, chronic     Review of Systems  Respiratory: Negative for cough and shortness of breath.   Cardiovascular: Negative for chest pain.      Objective:   Physical Exam  BP 122/72  Pulse 82  Temp(Src) 97 F (36.1 C) (Oral)  Wt 247 lb 9.6 oz (112.311 kg)  BMI 39.98 kg/m2  SpO2 95% Wt Readings from Last 3 Encounters:  10/02/12 247 lb 9.6 oz (112.311 kg)  07/17/12 244 lb 12.8 oz (111.041 kg)  04/10/12 240 lb (108.863 kg)   Constitutional: She is overweight, but appears well-developed and well-nourished. No distress.  HENT: clear rhinorrhea, sinus non tender - OP with cobblestoning - clear PND Neck: Thick. Normal range of motion. Neck supple. No JVD  present. No thyromegaly present.  Cardiovascular: Normal rate, regular rhythm and normal heart sounds.  No murmur heard. trace BLE edema. Pulmonary/Chest: Effort normal and breath sounds normal. No respiratory distress. She has no wheezes. Skin: Darken nail discoloration all toenails of R foot. Skin is warm and dry. No rash noted. No erythema.  Psychiatric: She has a normal mood and affect. Her behavior is normal. Judgment and thought content normal.   Lab Results  Component Value Date   WBC 6.9 04/10/2012   HGB 14.3 04/10/2012   HCT 43.6 04/10/2012   PLT 214.0 04/10/2012   GLUCOSE 84 04/10/2012   CHOL 233* 04/10/2012   TRIG 66.0 04/10/2012   HDL 84.70 04/10/2012   LDLDIRECT 122.8 04/10/2012   ALT 19 04/10/2012   AST 23 04/10/2012   NA 138 04/10/2012   K 4.2 04/10/2012   CL 104 04/10/2012   CREATININE 0.9 04/10/2012   BUN 12 04/10/2012   CO2 27 04/10/2012   TSH 0.99 04/10/2012       Assessment & Plan:   See problem list. Medications and labs reviewed today.  R foot with toenail discoloration - not improved with PenLac - refer to podiatry

## 2012-10-11 MED ORDER — ranitidine (ZANTAC) 150 MG tablet
150 | ORAL_TABLET | ORAL | Status: AC
Start: 2012-10-11 — End: 2014-01-31

## 2012-10-13 ENCOUNTER — Ambulatory Visit: Admit: 2012-10-13 | Discharge: 2012-10-13 | Attending: Neurology

## 2012-10-13 DIAGNOSIS — Z006 Encounter for examination for normal comparison and control in clinical research program: Secondary | ICD-10-CM

## 2012-10-13 NOTE — Unmapped (Signed)
Subjective:      Patient ID: Brenda [name] is a 57 y.o. female    HPI   Movement Disorders HPI  She is here for her first visit for the Enroll-HD study with her friend, Mrs. Donavan Burnet who is going to participate as a control.    PD Quality Metrics  Falls (Review Each Visit): denies           Depression (Review Annually): present                 Cognitive Impairment (Review Annually): present           Insomnia/Sleep Disturbance (Review Annually): denies          Histories:     Review of Systems    Allergies:     Medications:     Outpatient Encounter Prescriptions as of 10/13/2012   Medication Sig Dispense Refill   ??? clonazePAM (KLONOPIN) 1 MG tablet Take 1 mg by mouth 3 times a day as needed.       ??? coenzyme Q10 400 mg Cap Take 1,200 mg by mouth 2 times a day.        ??? ibuprofen (ADVIL,MOTRIN) 600 MG tablet Take 600 mg by mouth every 6 hours as needed.       ??? methylphenidate (RITALIN) 5 MG tablet        ??? mirabegron (MYRBETRIQ) 25 mg Tb24 Take 25 mg by mouth daily.       ??? multivitamin (THERAGRAN) tablet Take 1 tablet by mouth daily.       ??? omega-3 fatty acids (FISH OIL) 500 mg Cap Take 1 tablet by mouth 2 times a day.       ??? omeprazole (PRILOSEC) 40 MG capsule Take 40 mg by mouth 2 times a day.       ??? ranitidine (ZANTAC) 150 MG tablet TAKE ONE TABLET BY MOUTH THREE TIMES A DAY  90 tablet  5   ??? risperiDONE (RISPERDAL) 0.25 MG tablet Take 2 tablets (0.5 mg total) by mouth at bedtime.  180 tablet  3   ??? valproic acid (DEPAKENE) 250 mg capsule Take 1 capsule (250 mg total) by mouth 2 times a day.  60 capsule  11     No facility-administered encounter medications on file as of 10/13/2012.        Objective:       There were no vitals taken for this visit.    Neurologic Exam    Physical Exam    UHDRS 1: Motor Examination  Huntington's Disease Rating Scale  Ocular pursuit, horizontal: 1  Ocular pursuit, vertical: 1  Saccade initiation, horizontal: 2  Saccade initiation, vertical: 2  Saccade velocity, hortizontal  : 1  Saccade velocity, vertical: 1  Dysarthria : 1  Tongue Protrusion: 0  Fingers taps, right: 1  Fingers taps, left : 1  Pronate/supinate hands, right : 1  Pronate/supinate hands, left : 1  Luria (fist-hand-palm test): 1  Rigidity arms, right : 0  Rigidity arms, left: 0  Body Bradykinesia: 1  Maximal dystonia, trunk: 0  Maximal dystonia, RUE: 1  Maximal dystonia, LUE: 1  Maximal dystonia, RLE: 0  Maximal dystonia, LLE: 0  Maximal chorea, face: 1  Maixmal chorea, BOL: 0  Maximal chorea, trunk: 1   Maximal chorea, RUE: 1  Maximal chorea, LUE: 1  Maximal chorea, RLE: 1  Maximal chorea, LLE: 1  Gait: 1  Tandem Walking: 1  Retropulsion Pull Test: 0  Diagnosis  confidence level: 4  UHDRS 1 TOTAL:: 25        Assessment:     1. Huntington chorea    2. Examination of participant or control in clinical research      First ENROLL-HD visit.    Plan:

## 2012-10-13 NOTE — Unmapped (Signed)
Fwd to [employer]

## 2012-10-13 NOTE — Unmapped (Signed)
Fwd to Dr. Reather Converse and Dr. Heywood Footman to call Pt back .

## 2012-10-13 NOTE — Unmapped (Signed)
Pt calling because she had a research study today and she stated that she broke out in hives around her mouth, she took some benadryl b/c she thought it was an allergy.  I spoke to MA [place] and [place] stated that she will have Rosa from the study department call the pt ASAP

## 2012-10-17 NOTE — Unmapped (Addendum)
Pt calling she stated she is now having an allergic reaction to the generic brand of ritalin, she would like a call back. Called MA but no answer, pt would also like to know if a copy of her medication list and allergy list can be sent to Dr. Jonetta Speak phone#[phone]

## 2012-10-17 NOTE — Unmapped (Signed)
Called pt back, faxed over list to Dr. Corinne Ports

## 2012-10-23 NOTE — Unmapped (Signed)
She needs to speak to [facility] about a conversation they had about some ideas on medications that might help her fatigue or her being comatose?

## 2012-10-23 NOTE — Unmapped (Signed)
LMOV that Jeanice Lim will call her back tomorrow.

## 2012-10-31 NOTE — Care Coordination-Inpatient (Signed)
Spoke with patient denies any needs or concerns at this time for Care Coordination. Was seen by previous [facility] and assisted with the Living Will process, but has no new needs.  Will discharge patient from Care Coordination and mail letter to patient.    Glennis Brink, RN   Care Coordinator

## 2012-11-02 NOTE — Unmapped (Signed)
I called back and told her she could take Benadryl, bathe in warm water, or use Solarcaine to stop the itching.

## 2012-11-02 NOTE — Unmapped (Signed)
Called dr. Corinne Ports office, she is one prescibed aderall, spoke to MA, she stated pt has called there also.  Dr. Corinne Ports is in with a New Patient.  She just recently started pt on aderall 2 wks ago, was switched from ritalin.  I will fwd to Louanna Raw to approve if pt can take benedryl for itching until she can get into see Dr, Corinne Ports for follow up

## 2012-11-02 NOTE — Unmapped (Signed)
Patient stating she is having an allergic reaction to her Adderall. Patient started this 2 weeks ago. Patient has hives above her lip and eyebrow and feels itchy. Please advise.

## 2012-11-07 NOTE — Telephone Encounter (Signed)
Patient called today stating that she was having an allergic reaction to her medication. She stated it was between the adderall, ritalin and risperidone. i asked if she was seeing psychiatry for these? She stated that she was, i instructed her to follow up with psychiatry and they would take the appropriate actions. Then the patient stated she was not seeing psychiatry and requested the number for psychiatry. i spoke with [place] the office manager for Dr. Georgette DoverSytai and confirmed they are taking care of her psych medication management.

## 2012-11-07 NOTE — Telephone Encounter (Signed)
Received call from patient's Psychiatrist.  She plans to continue to manage her psychiatric medications.  She is concerned that her Huntington's disease is rapidly progressing and patient is psychotic.  Their office is receiving daily phone call from patient and Psychiatrist is being pages after hours and on weekends.  Psychiatrist plans to speak with patient's neurologist to discuss this further.

## 2012-11-07 NOTE — Unmapped (Signed)
Pt stating she is having a strange day. Please call back.

## 2012-11-07 NOTE — Unmapped (Signed)
Spoke with Dr. Corinne Ports, she stated pt has been calling not stop regarding medication being changed, she felt the pt is OCD in her medication changes, not sure if it is the HD progressing, recommended the pt to be hospitalized to make sure pt regulates medication.  Spoke to Dr. Heywood Footman  To approve

## 2012-11-07 NOTE — Unmapped (Signed)
Pt has questions on her stimulate drug? Also, pt is wanting to discuss some testing with you.

## 2012-11-15 NOTE — Telephone Encounter (Signed)
Patient received a letter from [place], and has questions she would like to ask you.  Please call back.

## 2012-11-15 NOTE — Telephone Encounter (Signed)
[facility], so all of her questions can be answered.

## 2012-11-15 NOTE — Telephone Encounter (Signed)
Spoke with patient regarding letter received discharging from Care Coordination. Had spoken to patient and no identified needs at this time. Patient in the "middle of a game and would like to discuss privately so will contact [facility] tomorrow with further questions."    Glennis Brink, RN   Care Coordinator

## 2012-11-16 NOTE — Telephone Encounter (Signed)
Danielle Lawrence said she got a letter from Danielle Lawrence about  A living will, and Danielle Lawrence. She CELL [phone]   wants To know about Danielle Lawrence     Her  # [phone] or cell  [phone]

## 2012-11-16 NOTE — Telephone Encounter (Signed)
Spoke with patient and states she did not place any new call today. She did call yesterday and left a message, but had spoken with this [facility] after and has no new needs or concerns.    Glennis Brink, RN   Care Coordinator

## 2012-11-16 NOTE — Telephone Encounter (Signed)
I will [facility] this to [facility], she was communicating with the patient yesterday( see note)

## 2012-11-16 NOTE — Telephone Encounter (Signed)
Jess can you talk with patient

## 2012-11-24 LAB — LITHIUM LEVEL: Lithium Lvl: 0.2 mmol/L — ABNORMAL LOW (ref 0.6–1.2)

## 2012-11-24 NOTE — Telephone Encounter (Signed)
Husband dropped off application to be signed to allow wife to use the [employer] connection. I told him we would call when form ready or if you need to see the patient. Form is on your desk

## 2012-11-28 NOTE — Telephone Encounter (Signed)
Patient informed.

## 2012-11-28 NOTE — Telephone Encounter (Signed)
Ready for pick up

## 2012-12-11 ENCOUNTER — Ambulatory Visit (INDEPENDENT_AMBULATORY_CARE_PROVIDER_SITE_OTHER): Payer: 59 | Admitting: Internal Medicine

## 2012-12-11 ENCOUNTER — Other Ambulatory Visit (INDEPENDENT_AMBULATORY_CARE_PROVIDER_SITE_OTHER): Payer: 59

## 2012-12-11 ENCOUNTER — Other Ambulatory Visit: Payer: Self-pay | Admitting: Internal Medicine

## 2012-12-11 ENCOUNTER — Encounter: Payer: Self-pay | Admitting: Internal Medicine

## 2012-12-11 VITALS — BP 128/86 | HR 77 | Temp 98.1°F | Ht 66.0 in | Wt 246.0 lb

## 2012-12-11 DIAGNOSIS — M79662 Pain in left lower leg: Secondary | ICD-10-CM

## 2012-12-11 DIAGNOSIS — B351 Tinea unguium: Secondary | ICD-10-CM

## 2012-12-11 DIAGNOSIS — Z5181 Encounter for therapeutic drug level monitoring: Secondary | ICD-10-CM

## 2012-12-11 DIAGNOSIS — M79609 Pain in unspecified limb: Secondary | ICD-10-CM

## 2012-12-11 LAB — HEPATIC FUNCTION PANEL
ALT: 19 U/L (ref 0–35)
AST: 20 U/L (ref 0–37)
Alkaline Phosphatase: 65 U/L (ref 39–117)
Bilirubin, Direct: 0 mg/dL (ref 0.0–0.3)
Total Protein: 7 g/dL (ref 6.0–8.3)

## 2012-12-11 MED ORDER — TERBINAFINE HCL 250 MG PO TABS: 250.0000 mg | ORAL_TABLET | Freq: Every day | ORAL | Status: DC

## 2012-12-11 NOTE — Progress Notes (Signed)
Subjective:    Patient ID: Chelsea Werner, female    DOB: 1956/06/17, 57 y.o.   MRN: [id number]  HPI  Pt presents to the clinic today with c/opain in her left calf. This started 4 days ago. She reports that it feels like a achy sensation. It does not feel crampy. She has never had pain like this in her calf before. She has not recently stopped or started any new medications. She denies chest pain, chest tightness or shortness of breath. She does feel like it is swollen. The pain is worse when she puts pressure on her foot. She has not taken anything for the pain. Additionally, pt wants to discuss her meds for toe fungus. This was started by a podiatrist. She has only taken 4/12 weeks with no improvement. She is wondering if she should continue to take it.  Review of Systems      Past Medical History  Diagnosis Date  . History of chicken pox   . Hx of migraines   . Allergic rhinitis   . Hyperlipidemia   . Positive TB test   . Thyroid disease   . History of blood transfusion   . Chronic low back pain   . Iritis, chronic     Current Outpatient Prescriptions  Medication Sig Dispense Refill  . ALLERGY RELIEF/NASAL DECONGEST 10-240 MG per 24 hr tablet TAKE 1 TABLET BY MOUTH DAILY.  30 tablet  3  . cetirizine-pseudoephedrine (ZYRTEC-D) 5-120 MG per tablet Take 1 tablet by mouth 2 (two) times daily as needed for allergies.  60 tablet  3  . diazepam (VALIUM) 5 MG tablet Take 5 mg by mouth 2 (two) times daily as needed.      . diclofenac (VOLTAREN) 75 MG EC tablet Take 75 mg by mouth 2 (two) times daily.      . fluticasone (FLONASE) 50 MCG/ACT nasal spray Place 2 sprays into the nose daily.  16 g  2  . furosemide (LASIX) 20 MG tablet Take 1 tablet (20 mg total) by mouth daily.  30 tablet  3  . HYDROcodone-acetaminophen (NORCO) 5-325 MG per tablet Take 1 tablet by mouth every 6 (six) hours as needed.  40 tablet  1  . hydrOXYzine (ATARAX/VISTARIL) 25 MG tablet Take 1 tablet (25 mg total) by mouth  3 (three) times daily as needed.  90 tablet  1  . Hypromellose (GENTEAL) 0.3 % SOLN Place 1 drop into both eyes daily.      Marland Kitchen lidocaine (LIDODERM) 5 % PLACE 1 PATCH ONTO THE SKIN DAILY. REMOVE & DISCARD PATCH WITHIN 12 HOURS OR AS DIRECTED BY MD  30 patch  1  . Multiple Vitamins-Minerals (WOMENS MULTIVITAMIN PLUS) TABS Take 1 tablet by mouth daily.      . prednisoLONE acetate (PRED FORTE) 1 % ophthalmic suspension Place 1 drop into both eyes every other day.  5 mL  1  . Probiotic Product (PROBIOTIC FORMULA PO) Take 1 capsule by mouth daily.      . traMADol (ULTRAM) 50 MG tablet Take 50 mg by mouth 3 (three) times daily.       No current facility-administered medications for this visit.    Allergies  Allergen Reactions  . Nsaids Hives    Family History  Problem Relation Age of Onset  . Early death Mother   . Hypertension Father   . Cancer Other     thyroid cancer-other relative    History   Social History  . Marital Status: Widowed  Spouse Name: N/A    Number of Children: N/A  . Years of Education: 191   Occupational History  . [employer]plain [employer]   Social History Main Topics  . Smoking status: Never Smoker   . Smokeless tobacco: Never Used  . Alcohol Use: Yes  . Drug Use: No  . Sexually Active: Not on file   Other Topics Concern  . Not on file   Social History Narrative   Regular exercise-no   Caffeine Use-yes     Constitutional: Denies fever, malaise, fatigue, headache or abrupt weight changes.  Musculoskeletal: Pt reports pain in the left calf. Denies decrease in range of motion, difficulty with gait, muscle pain or joint pain and swelling.  Skin: Denies redness, rashes, lesions or ulcercations. Pt reports toe fungus.   No other specific complaints in a complete review of systems (except as listed in HPI above).  Objective:   Physical Exam   BP 128/86  Pulse 77  Temp(Src) 98.1 F (36.7 C) (Oral)  Ht 5\' 6"  (1.676 m)  Wt 246 lb (111.585 kg)  BMI  39.72 kg/m2  SpO2 95% Wt Readings from Last 3 Encounters:  12/11/12 246 lb (111.585 kg)  10/02/12 247 lb 9.6 oz (112.311 kg)  07/17/12 244 lb 12.8 oz (111.041 kg)    General: Appears her stated age, well developed, well nourished in NAD. Skin: Warm, dry and intact. No rashes, lesions or ulcerations noted. Tinea ungum noted on toes of right foot. Cardiovascular: Normal rate and rhythm. S1,S2 noted.  No murmur, rubs or gallops noted. No JVD or BLE edema. No carotid bruits noted. Varicose vein noted on left posterior calf. Tender to touch. Pulmonary/Chest: Normal effort and positive vesicular breath sounds. No respiratory distress. No wheezes, rales or ronchi noted.  Musculoskeletal: Normal range of motion. No signs of joint swelling. No difficulty with gait. Negative homans's sign. Left calf 40.5 cm, right calf 40.5 cm.   BMET    Component Value Date/Time   NA 137 10/02/2012 1407   K 4.3 10/02/2012 1407   CL 102 10/02/2012 1407   CO2 30 10/02/2012 1407   GLUCOSE 78 10/02/2012 1407   BUN 14 10/02/2012 1407   CREATININE 0.8 10/02/2012 1407   CALCIUM 8.7 10/02/2012 1407    Lipid Panel     Component Value Date/Time   CHOL 233* 04/10/2012 1126   TRIG 66.0 04/10/2012 1126   HDL 84.70 04/10/2012 1126   CHOLHDL 3 04/10/2012 1126   VLDL 13.2 04/10/2012 1126    CBC    Component Value Date/Time   WBC 6.8 10/02/2012 1407   RBC 4.97 10/02/2012 1407   HGB 13.3 10/02/2012 1407   HCT 40.0 10/02/2012 1407   PLT 224.0 10/02/2012 1407   MCV 80.6 10/02/2012 1407   MCHC 33.2 10/02/2012 1407   RDW 14.2 10/02/2012 1407   LYMPHSABS 2.1 10/02/2012 1407   MONOABS 0.8 10/02/2012 1407   EOSABS 0.1 10/02/2012 1407   BASOSABS 0.0 10/02/2012 1407    Hgb A1C No results found for this basename: HGBA1C        Assessment & Plan:   Acute left calf pain, likely varicose vein:  Will obtain ultrasound to r/o superficial venous thrombus Wear compression stockings at this time Pt declines referral to vein  specialist.  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

## 2012-12-11 NOTE — Patient Instructions (Signed)
Varicose Veins  Varicose veins are veins that have become enlarged and twisted.  CAUSES  This condition is the result of valves in the veins not working properly. Valves in the veins help return blood from the leg to the heart. If these valves are damaged, blood flows backwards and backs up into the veins in the leg near the skin. This causes the veins to become larger. People who are on their feet a lot, who are pregnant, or who are overweight are more likely to develop varicose veins.  SYMPTOMS   · Bulging, twisted-appearing, bluish veins, most commonly found on the legs.  · Leg pain or a feeling of heaviness. These symptoms may be worse at the end of the day.  · Leg swelling.  · Skin color changes.  DIAGNOSIS   Varicose veins can usually be diagnosed with an exam of your legs by your caregiver. He or she may recommend an ultrasound of your leg veins.  TREATMENT   Most varicose veins can be treated at home. However, other treatments are available for people who have persistent symptoms or who want to treat the cosmetic appearance of the varicose veins. These include:  · Laser treatment of very small varicose veins.  · Medicine that is shot (injected) into the vein. This medicine hardens the walls of the vein and closes off the vein. This treatment is called sclerotherapy. Afterwards, you may need to wear clothing or bandages that apply pressure.  · Surgery.  HOME CARE INSTRUCTIONS   · Do not stand or sit in one position for long periods of time. Do not sit with your legs crossed. Rest with your legs raised during the day.  · Wear elastic stockings or support hose. Do not wear other tight, encircling garments around the legs, pelvis, or waist.  · Walk as much as possible to increase blood flow.  · Raise the foot of your bed at night with 2-inch blocks.  · If you get a cut in the skin over the vein and the vein bleeds, lie down with your leg raised and press on it with a clean cloth until the bleeding stops. Then  place a bandage (dressing) on the cut. See your caregiver if it continues to bleed or needs stitches.  SEEK MEDICAL CARE IF:   · The skin around your ankle starts to break down.  · You have pain, redness, tenderness, or hard swelling developing in your leg over a vein.  · You are uncomfortable due to leg pain.  Document Released: 03/31/2005 Document Revised: 09/13/2011 Document Reviewed: 08/17/2010  ExitCare® Patient Information ©2014 [employer].

## 2012-12-13 ENCOUNTER — Telehealth

## 2012-12-13 ENCOUNTER — Encounter (INDEPENDENT_AMBULATORY_CARE_PROVIDER_SITE_OTHER): Payer: 59

## 2012-12-13 DIAGNOSIS — I8310 Varicose veins of unspecified lower extremity with inflammation: Secondary | ICD-10-CM

## 2012-12-13 DIAGNOSIS — M79662 Pain in left lower leg: Secondary | ICD-10-CM

## 2012-12-13 DIAGNOSIS — M79609 Pain in unspecified limb: Secondary | ICD-10-CM

## 2012-12-13 MED ORDER — ZOSTER VACCINE LIVE 19400 UNT/0.65ML SC SOLR
19400 UNT/0.65ML | Freq: Once | SUBCUTANEOUS | Status: AC
Start: 2012-12-13 — End: 2012-12-13

## 2012-12-13 NOTE — Telephone Encounter (Signed)
rx printed and mailed.

## 2012-12-13 NOTE — Telephone Encounter (Signed)
Ok to order

## 2012-12-13 NOTE — Telephone Encounter (Signed)
Would like a RX for shingles vaccination mailed to her home (along with her husband,Michael).

## 2013-02-06 ENCOUNTER — Other Ambulatory Visit: Payer: Self-pay | Admitting: Internal Medicine

## 2013-02-08 ENCOUNTER — Other Ambulatory Visit: Payer: Self-pay | Admitting: Internal Medicine

## 2013-02-12 ENCOUNTER — Ambulatory Visit: Payer: 59 | Admitting: Internal Medicine

## 2013-02-16 ENCOUNTER — Encounter: Payer: Self-pay | Admitting: Internal Medicine

## 2013-02-16 ENCOUNTER — Ambulatory Visit (INDEPENDENT_AMBULATORY_CARE_PROVIDER_SITE_OTHER): Payer: 59 | Admitting: Internal Medicine

## 2013-02-16 VITALS — BP 120/84 | HR 82 | Temp 98.6°F | Wt 248.1 lb

## 2013-02-16 DIAGNOSIS — R609 Edema, unspecified: Secondary | ICD-10-CM

## 2013-02-16 DIAGNOSIS — J309 Allergic rhinitis, unspecified: Secondary | ICD-10-CM

## 2013-02-16 MED ORDER — FUROSEMIDE 20 MG PO TABS: 40.0000 mg | ORAL_TABLET | Freq: Every day | ORAL | Status: DC | PRN

## 2013-02-16 NOTE — Progress Notes (Signed)
  Subjective:    Patient ID: Chelsea Werner, female    DOB: 1956-05-03, 57 y.o.   MRN: [id number]  HPI  Here for edema, B feet - chronic takes loop diuretic prn, but feels ineffective in past 7 days  Also reviewed chronic medical issues:  Tailbone pain - new symptoms 03/2012 - rheum labs unremarkable 04/2012 - working with ortho on same, MRI 06/2012 unremarkable -  Chronic iritis. Onset 2008 -follows with ophthalmology regularly for same and control symptoms with daily steroid drops -s/p second opinion evaluation at [facility] in 2009, reports negative for secondary cause -including false reports of TB, status post three-month treatment with difficulty [facility]  Chronic low back pain. Related to arthritic changes. Uses Lidoderm as needed   Past Medical History  Diagnosis Date  . History of chicken pox   . Hx of migraines   . Allergic rhinitis   . Hyperlipidemia   . Positive TB test   . Thyroid disease   . History of blood transfusion   . Chronic low back pain   . Iritis, chronic     Review of Systems  Constitutional: Negative for fever and fatigue.  HENT: Positive for ear pain (left), rhinorrhea, postnasal drip and sinus pressure (left). Negative for congestion and tinnitus.   Respiratory: Negative for cough and shortness of breath.   Cardiovascular: Positive for leg swelling (L>R). Negative for chest pain and palpitations.       Objective:   Physical Exam  BP 120/84  Pulse 82  Temp(Src) 98.6 F (37 C) (Oral)  Wt 248 lb 1.9 oz (112.546 kg)  BMI 40.07 kg/m2  SpO2 97% Wt Readings from Last 3 Encounters:  02/16/13 248 lb 1.9 oz (112.546 kg)  12/11/12 246 lb (111.585 kg)  10/02/12 247 lb 9.6 oz (112.311 kg)   Constitutional: She is overweight, but appears well-developed and well-nourished. No distress.  HENT: no sinus pain to palpation, TMs clear without erythema or effusion Neck: Thick. Normal range of motion. Neck supple. No JVD present. No thyromegaly  present.  Cardiovascular: Normal rate, regular rhythm and normal heart sounds.  No murmur heard. trace L>R LE edema. Pulmonary/Chest: Effort normal and breath sounds normal. No respiratory distress. She has no wheezes. Psychiatric: She has a normal mood and affect. Her behavior is normal. Judgment and thought content normal.   Lab Results  Component Value Date   WBC 6.8 10/02/2012   HGB 13.3 10/02/2012   HCT 40.0 10/02/2012   PLT 224.0 10/02/2012   GLUCOSE 78 10/02/2012   CHOL 233* 04/10/2012   TRIG 66.0 04/10/2012   HDL 84.70 04/10/2012   LDLDIRECT 122.8 04/10/2012   ALT 19 12/11/2012   AST 20 12/11/2012   NA 137 10/02/2012   K 4.3 10/02/2012   CL 102 10/02/2012   CREATININE 0.8 10/02/2012   BUN 14 10/02/2012   CO2 30 10/02/2012   TSH 1.33 10/02/2012       Assessment & Plan:   See problem list. Medications and labs reviewed today.

## 2013-02-16 NOTE — Assessment & Plan Note (Signed)
Increase in seasonal symptoms, left side sinus but no evidence for infection continue Zyrtec D. 12 hour , Flonase spray and hydrozxine prn

## 2013-02-16 NOTE — Assessment & Plan Note (Signed)
Chronic symptoms - Reviewed weight gain (complicated by immobility with foot and leg pain /ortho issues) Increase dose furosemide now Instructed to avoid NSAIDs and recommend use of compression stockings Check echo (ordered but not completed March 2014_ - Continue diuretic prn, qd if needed

## 2013-02-16 NOTE — Patient Instructions (Signed)
It was good to see you today. We have reviewed your prior records including labs and tests today. we'll make referral for ultrasound of your heart to look for problems causing swelling or rapid heart rate. Our office will contact you regarding appointment(s) once made. Medications reviewed and updated, increase dose furosemide for swelling, no other changes recommended Your prescription(s) have been submitted to your pharmacy. Please take as directed and contact our office if you believe you are having problem(s) with the medication(s). Avoid sodium/salt, keep legs elevated when possible and wear compression hose as discussed to minimize swelling symptoms  Edema Edema is an abnormal build-up of fluids in tissues. Because this is partly dependent on gravity (water flows to the lowest place), it is more common in the legs and thighs (lower extremities). It is also common in the looser tissues, like around the eyes. Painless swelling of the feet and ankles is common and increases as a person ages. It may affect both legs and may include the calves or even thighs. When squeezed, the fluid may move out of the affected area and may leave a dent for a few moments. CAUSES   Prolonged standing or sitting in one place for extended periods of time. Movement helps pump tissue fluid into the veins, and absence of movement prevents this, resulting in edema.  Varicose veins. The valves in the veins do not work as well as they should. This causes fluid to leak into the tissues.  Fluid and salt overload.  Injury, burn, or surgery to the leg, ankle, or foot, may damage veins and allow fluid to leak out.  Sunburn damages vessels. Leaky vessels allow fluid to go out into the sunburned tissues.  Allergies (from insect bites or stings, medications or chemicals) cause swelling by allowing vessels to become leaky.  Protein in the blood helps keep fluid in your vessels. Low protein, as in malnutrition, allows fluid to  leak out.  Hormonal changes, including pregnancy and menstruation, cause fluid retention. This fluid may leak out of vessels and cause edema.  Medications that cause fluid retention. Examples are sex hormones, blood pressure medications, steroid treatment, or anti-depressants.  Some illnesses cause edema, especially heart failure, kidney disease, or liver disease.  Surgery that cuts veins or lymph nodes, such as surgery done for the heart or for breast cancer, may result in edema. DIAGNOSIS  Your caregiver is usually easily able to determine what is causing your swelling (edema) by simply asking what is wrong (getting a history) and examining you (doing a physical). Sometimes x-rays, EKG (electrocardiogram or heart tracing), and blood work may be done to evaluate for underlying medical illness. TREATMENT  General treatment includes:  Leg elevation (or elevation of the affected body part).  Restriction of fluid intake.  Prevention of fluid overload.  Compression of the affected body part. Compression with elastic bandages or support stockings squeezes the tissues, preventing fluid from entering and forcing it back into the blood vessels.  Diuretics (also called water pills or fluid pills) pull fluid out of your body in the form of increased urination. These are effective in reducing the swelling, but can have side effects and must be used only under your caregiver's supervision. Diuretics are appropriate only for some types of edema. The specific treatment can be directed at any underlying causes discovered. Heart, liver, or kidney disease should be treated appropriately. HOME CARE INSTRUCTIONS   Elevate the legs (or affected body part) above the level of the heart, while lying  down.  Avoid sitting or standing still for prolonged periods of time.  Avoid putting anything directly under the knees when lying down, and do not wear constricting clothing or garters on the upper  legs.  Exercising the legs causes the fluid to work back into the veins and lymphatic channels. This may help the swelling go down.  The pressure applied by elastic bandages or support stockings can help reduce ankle swelling.  A low-salt diet may help reduce fluid retention and decrease the ankle swelling.  Take any medications exactly as prescribed. SEEK MEDICAL CARE IF:  Your edema is not responding to recommended treatments. SEEK IMMEDIATE MEDICAL CARE IF:   You develop shortness of breath or chest pain.  You cannot breathe when you lay down; or if, while lying down, you have to get up and go to the window to get your breath.  You are having increasing swelling without relief from treatment.  You develop a fever over 102 F (38.9 C).  You develop pain or redness in the areas that are swollen.  Tell your caregiver right away if you have gained 3 lb/1.4 kg in 1 day or 5 lb/2.3 kg in a week. MAKE SURE YOU:   Understand these instructions.  Will watch your condition.  Will get help right away if you are not doing well or get worse. Document Released: 06/21/2005 Document Revised: 12/21/2011 Document Reviewed: 02/07/2008 [facility] Patient Information 2014 [place], [place].

## 2013-02-19 LAB — LITHIUM LEVEL: Lithium Lvl: 0.5 mmol/L — ABNORMAL LOW (ref 0.6–1.2)

## 2013-02-23 ENCOUNTER — Other Ambulatory Visit (HOSPITAL_COMMUNITY): Payer: 59

## 2013-03-30 ENCOUNTER — Encounter

## 2013-03-30 NOTE — Telephone Encounter (Signed)
Patient is having a physical on November 19th and would like scripts sent to her to do blood work prior to the appointment.  She would also like scripts for a dexa and mammo.

## 2013-03-30 NOTE — Telephone Encounter (Signed)
Left message on machine informing patient.

## 2013-03-30 NOTE — Telephone Encounter (Signed)
Labs ordered

## 2013-05-01 LAB — LITHIUM LEVEL: Lithium Lvl: 0.2 mmol/L — ABNORMAL LOW (ref 0.6–1.2)

## 2013-05-04 LAB — CBC WITH AUTO DIFFERENTIAL
Basophils %: 1.1 %
Basophils Absolute: 0.1 10*3/uL (ref 0.0–0.2)
Eosinophils %: 4 %
Eosinophils Absolute: 0.2 10*3/uL (ref 0.0–0.6)
Hematocrit: 41 % (ref 36.0–48.0)
Hemoglobin: 13.7 g/dL (ref 12.0–16.0)
Lymphocytes %: 23.6 %
Lymphocytes Absolute: 1.3 10*3/uL (ref 1.0–5.1)
MCH: 29.3 pg (ref 26.0–34.0)
MCHC: 33.3 g/dL (ref 31.0–36.0)
MCV: 87.8 fL (ref 80.0–100.0)
MPV: 9.5 fL (ref 5.0–10.5)
Monocytes %: 12.7 %
Monocytes Absolute: 0.7 10*3/uL (ref 0.0–1.3)
Neutrophils %: 58.6 %
Neutrophils Absolute: 3.2 10*3/uL (ref 1.7–7.7)
Platelets: 225 10*3/uL (ref 135–450)
RBC: 4.67 M/uL (ref 4.00–5.20)
RDW: 12.6 % (ref 12.4–15.4)
WBC: 5.4 10*3/uL (ref 4.0–11.0)

## 2013-05-04 LAB — COMPREHENSIVE METABOLIC PANEL
ALT: 37 U/L (ref 10–40)
AST: 35 U/L (ref 15–37)
Albumin/Globulin Ratio: 1.5 (ref 1.1–2.2)
Albumin: 4.6 g/dL (ref 3.4–5.0)
Alkaline Phosphatase: 91 U/L (ref 40–129)
BUN: 13 mg/dL (ref 7–20)
CO2: 26 mmol/L (ref 21–32)
Calcium: 10.1 mg/dL (ref 8.3–10.6)
Chloride: 102 mmol/L (ref 99–110)
Creatinine: 0.8 mg/dL (ref 0.6–1.1)
GFR African American: >60 (ref >60)
GFR Non-African American: >60 (ref >60)
Globulin: 3 g/dL
Glucose: 87 mg/dL (ref 70–99)
Potassium: 4 mmol/L (ref 3.5–5.1)
Sodium: 141 mmol/L (ref 136–145)
Total Bilirubin: 0.4 mg/dL (ref 0.0–1.0)
Total Protein: 7.6 g/dL (ref 6.4–8.2)

## 2013-05-04 LAB — LIPID PANEL
Cholesterol, Total: 215 mg/dL — ABNORMAL HIGH (ref 0–199)
HDL: 67 mg/dL — ABNORMAL HIGH (ref 40–60)
LDL Calculated: 130 mg/dL — ABNORMAL HIGH (ref <100)
Triglycerides: 90 mg/dL (ref 0–150)
VLDL Cholesterol Calculated: 18 mg/dL

## 2013-05-04 NOTE — Unmapped (Addendum)
Patient feels she needs her medication adjusted. Feels her Risperdal  is too low. Currently taking .5 mg once daily.  Stated she is off balance, feels like she is drunk, body twitches. Has a spastic, asthmatic  cough when outside in cold. Feels she is getting uncoordinated. Feels she needs an appointment.

## 2013-05-04 NOTE — Unmapped (Signed)
Spoke to son Gabriel Rung. He says that his mother has been more sleepy since being on lithium. She also reports increased movements. He is hoping for an appointment so that her medications and symptoms can be reviewed.

## 2013-05-04 NOTE — Unmapped (Signed)
Stated office may talk to her husband and her son Brenda [name] Phone # [phone]

## 2013-05-05 LAB — VITAMIN D 25 HYDROXY: Vit D, 25-Hydroxy: 36 ng/mL (ref 30–80)

## 2013-05-07 NOTE — Telephone Encounter (Signed)
Patient called to ask for the results of her blood tests (lithium and fasting).  She is having a great deal of fatigue; having difficulty staying awake during the day.  Please call with results and discuss fatigue at [phone]

## 2013-05-07 NOTE — Telephone Encounter (Signed)
Lab results reviewed with patient.  Has scheduled f/u with Neurology tomorrow.  Will discuss daytime somnolence

## 2013-05-07 NOTE — Unmapped (Signed)
Left message for husband to return my call.

## 2013-05-07 NOTE — Unmapped (Signed)
Pt states she just has been off balance, exhausted x 10 days.  She is wondering if risperadone 0.5 mg 1 tab in afternoon needs to be adjusted?  Will forward to Dr. Jule Ser to advise.    I updated med list in chart.      I scheduled appt for pt tomorrow @ 8am.  Call complete

## 2013-05-07 NOTE — Unmapped (Signed)
Patient is returning your call.

## 2013-05-07 NOTE — Unmapped (Signed)
Patient is feeling extremely tired, and is experiencing extra jerking, and twitching movements, since Lithium.  Patient is having problems staying awake, and having little to no energy. Please follow up with recommendations.

## 2013-05-08 ENCOUNTER — Encounter

## 2013-05-08 ENCOUNTER — Ambulatory Visit: Admit: 2013-05-08 | Payer: PRIVATE HEALTH INSURANCE | Attending: Neurology

## 2013-05-08 DIAGNOSIS — G10 Huntington's disease: Secondary | ICD-10-CM

## 2013-05-08 MED ORDER — modafinil (PROVIGIL) 200 MG tablet
200 | ORAL_TABLET | Freq: Every morning | ORAL | 1.00 refills | 30.00000 days | Status: AC
Start: 2013-05-08 — End: 2013-09-13

## 2013-05-08 NOTE — Unmapped (Signed)
Subjective:      Patient ID: Brenda [name] is a 57 y.o. female.    HPI     Movement Disorders HPI  She is here for a regular f/u with her husband. She has been more fatigued since the last visit (November 2013). She is taking risperidone 0.5 tab per day,  lithium 150 mg tab tid. She reports more movements in the mouth and tongue and they are getting worse over time. She reports two falls since the last visit, but denies major injuries. She denies hospitalizations recently. She reports that when she tried to increase the dose of lithium she feels a little better, but she can not move well. She wants something to improve her brain and help her to wake up during the day. She reports that the movements are bothering her. She had crying spells during the visit today.  She denies depression but said she is upset due to her fatigue because she wants to do more things and she can not. She is doing exercise twice per week.  From previous notes.   She is here for a follow-up visit with her husband. She is overall improved, now taking a regular dose of risperidone 0.5 mg qhs. She tried Depakote 250 mg bid after the last visit, but she suffered side effects (diarrhea). After discussing with Dr. Natasha Bence, she stopped the medication. She is taking risperidone 0.5 mg at bedtime. She has decreased the clonazepam to 1 mg tid as needed after advice from Dr. Natasha Bence, since she was suffering from sleepiness during the day. At the last visit, she has a constant feeling of dry mouth. She calls them thirsty spells. She drinks a lot of fluids, iced tea, and Gatorade. She kept repeating the same topic throughout the visit. However, now, she reports that the dry mouth has decreased on its own, but it has not gone.   From previous visits: in the past she was seen by Dr. Barbaraann Share at [facility]. She provided several recommendations in her note including possibly adding something such as Depakote, Topamax, lithium or tegretol for OCD  symptoms. She is followed by Dr Corinne Ports in psychiatry and we will have her discuss these possibilities with her. She is taking risperidone 0.25 mg BID. She says it has especially helped with her breathing and chest tightness in the past. She says it also makes her feel content and relaxed and has decreased her chorea. She feels it makes her somnolent during the day since she started taking the morning dose. We said she could try taking all of her risperidone at bedtime to combat any somnolence it may be causing but she has not made this change yet. She felt risperidone was causing her to have excessive thirst in the past but says this is getting better. She takes clonazepam 0.5 mg at 5 pm and 10 pm (more recently takes this at 9 pm and 11pm). She says her hyper or manic feelings during the day are improved. She is drinking more caffeine for somnolence. She denies any significant chorea. She continues to exercise regularly.   Her initial history follows: she first noticed racy, speedy spells in December 2010. These spells consist of her mind racing, she starts speaking, her muscles get tight. Initially she thought these spells were due to taking steroids (for asthma). Shortly afterwards she noticed generalized involuntary movements. She describes them as twitches. She started dropping objects, since mid 2011. She denies difficulty walking, but she admits that her balance can  be off at times. In November 2011 she was rocking in a chair and she fell backwards with the whole chair. In 2010 she was trying to step over a dog gate and she fell and hit her face; she suffered a left orbital fracture. She has noticed that she is bumping into things while walking. She still walks and plays golf. She denies hallucinations, delusions, lightheadedness, fainting spells, daytime sedation, nausea or vomiting. She denies memory problems. She admits occasional feelings of depression, especially in the winter. She has  tried sertraline in the past as well as escitalopram but stopped these previously due to side effects. 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 She discontinued sertraline in the past. She did not feel the need to start tetrabenazine, partially because it would be difficult to get.       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    She has past surgical history that includes Sinus surgery and Tonsillectomy.    Her family history includes Alzheimer's disease in her father; Anxiety disorder in her sister; Depression in her sister; Huntington's disease in her maternal uncle; Huntington's disease (age of onset: 66) in her mother; Pulmonary embolism in her mother; and Suicidality in her sister.    She reports that she has never smoked. She has never used smokeless tobacco. She reports that she drinks about 0.6 ounces of alcohol per week. She reports that she does not use illicit drugs.      Review of Systems    Allergies:   Zithromax; Bactrim; Erythromycin; Erythromycin base; Methylphenidate; and Sulfa (sulfonamide antibiotics)    Medications:     Outpatient Encounter Prescriptions as of 05/08/2013   Medication Sig Dispense Refill   ??? clonazePAM (KLONOPIN) 1 MG tablet Take 0.5 mg by mouth 2 times a day as needed.        ??? coenzyme Q10 400 mg Cap Take 1,200 mg by mouth 2 times a day.        ??? ibuprofen (ADVIL,MOTRIN) 600 MG tablet Take  600 mg by mouth every 6 hours as needed.       ??? levOCARNitine, bulk, Powd Use 500 mg as directed. Take 6-8 tablets daily       ??? lithium carbonate 150 MG capsule Take 150 mg by mouth 3 times a day with meals.       ??? multivitamin (THERAGRAN) tablet Take 1 tablet by mouth daily.       ??? omega-3 fatty acids (FISH OIL) 500 mg Cap Take 1 tablet by mouth 2 times a day.       ??? ranitidine (ZANTAC) 150 MG tablet TAKE ONE TABLET BY MOUTH THREE TIMES A DAY  90 tablet  5   ??? risperiDONE (RISPERDAL) 0.5 MG tablet Take 0.5 mg by mouth daily.       ???  UNABLE TO FIND Calms forte  Take prn         No facility-administered encounter medications on file as of 05/08/2013.        Objective:       Blood pressure 120/80, pulse 72, height 5' 3 (1.6 m), weight 134 lb (60.782 kg).    Neurologic Exam  UHDRS 1: Motor Examination  Huntington's Disease Rating Scale  Ocular pursuit, horizontal: 1  Ocular pursuit, vertical: 1  Saccade initiation, horizontal: 1  Saccade initiation, vertical: 2  Saccade velocity, hortizontal : 1  Saccade velocity, vertical: 1  Dysarthria : 1  Tongue Protrusion: 0  Fingers taps, right: 1  Fingers taps, left : 1  Pronate/supinate hands, right : 1  Pronate/supinate hands, left : 1  Luria (fist-hand-palm test): 0  Rigidity arms, right : 0  Rigidity arms, left: 0  Body Bradykinesia: 1  Maximal dystonia, trunk: 0  Maximal dystonia, RUE: 1  Maximal dystonia, LUE: 1  Maximal dystonia, RLE: 0  Maximal dystonia, LLE: 0  Maximal chorea, face: 1  Maixmal chorea, BOL: 0  Maximal chorea, trunk: 1   Maximal chorea, RUE: 1  Maximal chorea, LUE: 1  Maximal chorea, RLE: 1  Maximal chorea, LLE: 1  Gait: 1  Tandem Walking: 1  Retropulsion Pull Test: 0  Diagnosis confidence level: 4  UHDRS 1 TOTAL:: 23    Physical Exam         Assessment:     1. Huntington's chorea      She has family history of HD, and her history and examination are consistent with a clinical diagnosis of Huntington's disease, progressively worse over time. 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 She is taking risperidone, which has helped. We recommended to increase the dose again, observing carefully for side effects; but we defer to her psychiatrist. Risperidone has helped with depression, anxiety, and chorea overall however she has had side effects of dry mouth and somnolence in the past. Some of these side effects (in particular the dry mouth, for which she is drinking a lot of fluids) she obsesses about. She took sertraline and escitalopram in the past but these made her feel manic. She should continue to follow-up with Dr. Corinne Ports in psychiatry. I recommended to start modafinil (Provigil) 100 mg in the morning. Regular stretching exercises were recommended again. I extensively discussed these issues the patient and her husband, and answered all her questions     Plan:      1. Start Provigil 100 mg in the morning. 2. Consider a higher dose of risperidone; we defer to Dr Corinne Ports, her psychiatrist, who will see her next week. 3. Same dose of clonazepam as needed. 4. Regular exercise program. 5. Continue to follow up with Psychiatry. 6. Follow-up in 4 months.

## 2013-05-08 NOTE — Unmapped (Signed)
The patient's husband is calling. The Provigil prescription was just dropped off to the pharmacy and is requiring a PA. He is wanting this completed ASAP.

## 2013-05-08 NOTE — Unmapped (Signed)
Referral received from Dr. Heywood Footman to meet patient.  Social worker met with Mrs.Blank and her husband.  Introduced self and explained Child psychotherapist role.  Encouraged them to follow up with any social work needs.    Coralyn Helling, MSW, LISW-S  [phone]

## 2013-05-09 NOTE — Telephone Encounter (Signed)
Patient is still feeling fatigued and her neurologist suggested she try narcolepsy drops.  He indicated to her that her fatigue is normal, but she should call you to see if we can give her samples of these drops.  Call her at [phone]

## 2013-05-09 NOTE — Telephone Encounter (Signed)
LM Informing patient that we do not carry samples.

## 2013-05-09 NOTE — Telephone Encounter (Signed)
Please inform patient that we do not carry samples

## 2013-05-09 NOTE — Unmapped (Signed)
Mrs. Brenda [name] states that the pharmacy needs to speak with Dr. Melodye Ped office in regards to the prescription for Provigil.  There is an issue getting approval from her insurance.  The [facility] is [facility] [place]- phone: [phone].    Coralyn Helling, MSW, LISW-S  [phone]

## 2013-05-10 NOTE — Unmapped (Signed)
Left message on VM that PA was submitted yesterday and still waiting for response from insurance.  Call complete

## 2013-05-10 NOTE — Unmapped (Signed)
Per message today that I left on pt's VM.  PA for med was submitted yesterday.  Waiting to hear back from insurance.  Call complete

## 2013-05-10 NOTE — Unmapped (Signed)
Patient calling to follow up in reference to a letter needed for this prescription:    1) modafinil (PROVIGIL) 200 MG tablet.   Patient wants to know if this letter has gone out, wife is getting anxious.

## 2013-05-15 NOTE — Unmapped (Signed)
It shows in the chart that this was printed 05/08/13. Not sure what was done with it.   Misty Stanley can check on it tomorrow.

## 2013-05-15 NOTE — Unmapped (Signed)
Pt calling in regards to her Modafinil. She spoke to the pharmacy, she believes that the medication may be approved and she would like the script to be sent to the pharmacy. She is seeing Dr. Corinne Ports - Psychistrist ( 05/17/13) and she was wanting to take the medication prior to seeing this physician. She understands that this may not happen now.     Pt is having trouble staying awake during the day. She is drinking a lot of coffee and tee to stay awake. She is would really like to have her Modafinil asap.

## 2013-05-16 NOTE — Unmapped (Signed)
Spoke with patient. Calling in regards to a narcolepsy medication. Office did not have samples, wanted a script. Please return the patients call.

## 2013-05-17 NOTE — Unmapped (Signed)
Spoke with pt and informed her that insurance denied her Modafinil.  I told her we will send an appeals letter and try to get it approved.  Pt states she is sleeping too much during the day.      [employer] phone [phone]                      Fax  [phone]    Pt ID [id number]

## 2013-05-22 NOTE — Unmapped (Signed)
Spoke with the patients husband. Received another letter from [facility]. Insurance does not want to cover the modafinil (PROVIGIL) 200 MG tablet. Is there alternate drug? Patient is scheduled to see Gerilyn Pilgrim on March 11, he was wondering he that appointment could be changed to March 12 th at [facility]. Please call him back on his cell #

## 2013-05-23 NOTE — Progress Notes (Signed)
Subjective:      Patient ID: Danielle Lawrence is a 57 y.o. female.    HPI  Here for a well exam.  Being followed by Neurology for her Huntingtons disease.  Has had some recent medication changes which seems to be helping her thought processes per patient.  She is very concerned about her prognosis.  Feels her anxiety is well controlled.  Past Medical History, Medications, Social History, Family History, Health Maintenance and Labs have been reviewed with Danielle Lawrence.      Review of Systems   Constitutional: Negative for unexpected weight change.   Respiratory: Negative for cough, chest tightness, shortness of breath and wheezing.    Cardiovascular: Negative for chest pain, palpitations and leg swelling.   Gastrointestinal: Negative for nausea, vomiting, diarrhea, constipation and abdominal distention.   Musculoskeletal: Negative for myalgias, back pain and arthralgias.   Neurological: Positive for dizziness, tremors, speech difficulty and weakness. Negative for numbness.   Psychiatric/Behavioral: Positive for confusion, dysphoric mood and decreased concentration. Negative for suicidal ideas and self-injury.   All other systems reviewed and are negative.        Objective:   Physical Exam   Constitutional: She is oriented to person, place, and time. She appears well-developed and well-nourished. No distress.   HENT:   Right Ear: External ear normal.   Left Ear: External ear normal.   Mouth/Throat: Oropharynx is clear and moist. No oropharyngeal exudate.   Neck: Neck supple. No thyromegaly present.   Cardiovascular: Normal rate, regular rhythm and normal heart sounds.    Pulmonary/Chest: Effort normal and breath sounds normal. No respiratory distress. She has no wheezes. She has no rales.   Abdominal: Soft. She exhibits no distension. There is no tenderness. There is no rebound and no guarding.   Musculoskeletal: She exhibits no edema.   Neurological: She is alert and oriented to person, place, and time.   Slow thought  procession with somewhat tangential speech.  Occasional hand and facial movements   Skin: She is not diaphoretic.       Assessment:    Well exam.  Continue medications. F/u with Neurology.  Mammogram ordered. Refuses tetanus today        Plan:

## 2013-05-23 NOTE — Unmapped (Signed)
Left message for pt's husband stating I faxed appeals letter today to [facility] for pt's Provigil.  I also asked him to call scheduling and he can change pt's appt for [facility] [place] with Dr. Gerilyn Pilgrim.  Call complete

## 2013-06-08 NOTE — Unmapped (Signed)
The patient is wanting to know the status of the appeal for Provigil? Also, she has recently had an increase in generalized muscle pain that she describes as a burning pain. She reports that she has noticed the severity of the pain has gradually worsened over the past few weeks. She does feel that the pain worsens after she exerts herself with little activity such as just walking up the stairs. She has been taking Ibuprofen 200 mg, BID for the past couple of days but it is only minimally effective. She is wanting to know the maximum allowed dosing she is able to take? Or if Dr. Heywood Footman would like to prescribe something a little stronger? If not, what are Dr. Melodye Ped recommendations?

## 2013-06-08 NOTE — Unmapped (Signed)
I attempted to reach patient multiple times with no response. I left voicemail that I was returning her call.     Okay to take ibuprofen for pain. She can take 200 mg tablets up to 1000 mg a day with food. I advised her to call back if she has any other questions.     We still haven't heard back about Provigil approval.

## 2013-06-19 NOTE — Unmapped (Signed)
Provigil was denied x 2.  Nuvigil needs PA as well.  Will route to Dr. Jule Ser to advise

## 2013-06-21 NOTE — Unmapped (Signed)
Brenda [name], Brenda [name] (Self) [phone] (M)    Patient has questions about feeling lethargic.    1) clonazePAM (KLONOPIN) 1 MG tablet, Take 0.5 mg by mouth 2 times a day as needed. Patient has been self-medicating, and her doses fluctuate between 1.5-2 mg.  2) risperiDONE (RISPERDAL) 0.5 MG, Take 0.5 mg by mouth daily. Patient is currently taking 1.5 MG.  3) lithium carbonate 150 MG, Take 150 mg by mouth 3 times a day with meals.      Pain is experiencing pain in her back, and up to legs. Patient's pain is 5/10.    What are your recommendations?

## 2013-06-21 NOTE — Unmapped (Signed)
Spoke with pt.  She states Dr. Corinne Ports told her she can take more clonazepam if needed.  Pt states she is taking 3/4 tab of clonazepam, 1/2 tab @ 9:30 and 1/2 tab this afternoon.  (pt is a little confused about this med in particular)    Pt feels like risperidone is not strong enough.  She is currently taking 0.5 mg 3 times per day.  She thinks this may need to be increased more??    Pt would like to know about getting some pain med.  She stated she called Dr. Barnie Del office and she is hard to get ahold of.  Pt is currently in [place].

## 2013-06-21 NOTE — Unmapped (Signed)
Noted. She can take ibuprofen 200 mg 3-4 times a day as need with food.

## 2013-06-22 NOTE — Unmapped (Signed)
Left message for pt to return my call.

## 2013-07-03 NOTE — Unmapped (Signed)
Spoke with pt and relayed message from Dr. Jacob.  Call complete

## 2013-07-11 ENCOUNTER — Other Ambulatory Visit: Payer: Self-pay | Admitting: *Deleted

## 2013-07-11 NOTE — Telephone Encounter (Signed)
Ok to send - please sent to appropriate [facility] pharmacy as needed thanks

## 2013-07-11 NOTE — Telephone Encounter (Signed)
Patient is transferring from [facility] to [facility].  Pharmacist has called requesting Lidocaine Patch order.  End date for Lidocaine order was yesterday, 07/10/13.   Please advise.

## 2013-07-12 ENCOUNTER — Other Ambulatory Visit: Payer: Self-pay | Admitting: *Deleted

## 2013-07-13 ENCOUNTER — Other Ambulatory Visit: Payer: Self-pay | Admitting: *Deleted

## 2013-07-13 MED ORDER — LIDOCAINE 5 % EX PTCH: MEDICATED_PATCH | CUTANEOUS | Status: DC

## 2013-09-12 ENCOUNTER — Encounter: Payer: PRIVATE HEALTH INSURANCE | Attending: Neurology

## 2013-09-13 ENCOUNTER — Ambulatory Visit: Admit: 2013-09-13 | Payer: PRIVATE HEALTH INSURANCE | Attending: Neurology

## 2013-09-13 DIAGNOSIS — G10 Huntington's disease: Secondary | ICD-10-CM

## 2013-09-13 NOTE — Unmapped (Signed)
Subjective:      Patient ID: Brenda [name] is a 58 y.o. female.    HPI   Movement Disorders HPI    The patient presents for follow-up.  She was previously seen by Dr. Heywood Footman.  She says her balance is slightly worse.  She is having mild chorea which she notices ever since starting Ritalin by Dr. Corinne Ports.  The Ritalin helps with her daytime sleepiness. She takes clonazepam for the chorea which helps.  Dr. Corinne Ports tried to increase her risperidone to 1.5 mg at bedtime but this made her more sleepy so they backed down to 1 mg at bedtime. Her swallowing is worse, she had speech therapy for this in the past. She is still exercising on her elliptical.     Initial hx as follows: Her initial history follows: she first noticed racy, speedy spells in December 2010. These spells consist of her mind racing, she starts speaking, her muscles get tight. Initially she thought these spells were due to taking steroids (for asthma). Shortly afterwards she noticed generalized involuntary movements. She describes them as twitches. She started dropping objects, since mid 2011. She denies difficulty walking, but she admits that her balance can be off at times. In November 2011 she was rocking in a chair and she fell backwards with the whole chair. In 2010 she was trying to step over a dog gate and she fell and hit her face; she suffered a left orbital fracture. She has noticed that she is bumping into things while walking. She still walks and plays golf. She denies hallucinations, delusions, lightheadedness, fainting spells, daytime sedation, nausea or vomiting. She denies memory problems. She admits occasional feelings of depression, especially in the winter. She has tried sertraline in the past as well as escitalopram but stopped these previously due to side effects. 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 She discontinued sertraline in the past. She did not feel the need to start  tetrabenazine, partially because it would be difficult to get.     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    She has past surgical history that includes Sinus surgery and Tonsillectomy.    Her family history includes Alzheimer's disease in her father; Anxiety disorder in her sister; Depression in her sister; Huntington's disease in her maternal uncle; Huntington's disease (age of onset: 60) in her mother; Pulmonary embolism in her mother; Suicidality in her sister.    She reports that she has never smoked. She has never used smokeless tobacco. She reports that she drinks about 0.6 ounces of alcohol per week. She reports that she does not use illicit drugs.      Review of Systems: As in HPI    Allergies:   Zithromax; Bactrim; Erythromycin; Erythromycin base; Methylphenidate; and Sulfa (sulfonamide antibiotics)    Medications:     Outpatient Encounter Prescriptions as of 09/13/2013  Medication Sig Dispense Refill   ??? clonazePAM (KLONOPIN) 1 MG tablet 0.5-1 tablet every 3 hours.         ??? ibuprofen (ADVIL,MOTRIN) 600 MG tablet Take 600 mg by mouth every 6 hours as needed.       ??? levOCARNitine, bulk, Powd Use 500 mg as directed. Take 6-8 tablets daily       ??? lithium (ESKALITH) 450 MG CR tablet Take 450 mg by mouth At bedtime.       ??? methylphenidate (RITALIN) 5 MG tablet Take 5 mg by mouth daily.       ??? multivitamin (THERAGRAN) tablet  Take 1 tablet by mouth daily.       ??? ranitidine (ZANTAC) 150 MG tablet TAKE ONE TABLET BY MOUTH THREE TIMES A DAY  90 tablet  5   ??? risperiDONE (RISPERDAL) 1 MG tablet Take 1 mg by mouth At bedtime.       ??? UNABLE TO FIND Calms forte  Take prn         No facility-administered encounter medications on file as of 09/13/2013.        Objective:       Blood pressure 130/62, pulse 80, height 5' 5 (1.651 m), weight 136 lb (61.689 kg), last menstrual period 05/20/2011.    Neurologic Exam    Physical Exam    UHDRS 1: Motor Examination  Huntington's Disease Rating Scale  Ocular pursuit, horizontal: 1  Ocular pursuit, vertical: 1  Saccade initiation, horizontal: 1  Saccade initiation, vertical: 2  Saccade velocity, hortizontal : 1  Saccade velocity, vertical: 1  Dysarthria : 1  Tongue Protrusion: 1  Fingers taps, right: 2  Fingers taps, left : 2  Pronate/supinate hands, right : 2  Pronate/supinate hands, left : 2  Luria (fist-hand-palm test): 0  Rigidity arms, right : 1  Rigidity arms, left: 1  Body Bradykinesia: 1  Maximal dystonia, trunk: 0  Maximal dystonia, RUE: 1  Maximal dystonia, LUE: 1  Maximal dystonia, RLE: 2  Maximal dystonia, LLE: 2  Maximal chorea, face: 1  Maixmal chorea, BOL: 0  Maximal chorea, trunk: 1   Maximal chorea, RUE: 1  Maximal chorea, LUE: 1  Maximal chorea, RLE: 1  Maximal chorea, LLE: 1  Gait: 1  Tandem Walking: 1  Retropulsion Pull Test: 0  Diagnosis confidence level: 4  UHDRS 1 TOTAL:: 34         Assessment:     1. Huntington's chorea    2. Anxiety      She has family history of HD, and her history and examination are consistent with a clinical diagnosis of Huntington's disease, progressively worse over time. 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 She is taking risperidone, which has helped.  She was started on methylphenidate which has helped with daytime sedation by Dr. Corinne Ports however she feels this makes chorea worse.  She continues to  take clonazepam which is helpful for anxiety and for chorea.     Plan:   1. Continue risperidone 1 mg qhs by Dr. Corinne Ports.   2. Continue clonazepam.   3. Continue methylphenidate 5 mg daily for now however will try to get Nuvigil 150 mg daily approved by insurance and switch to this if possible given her complaint of worsened chorea with the methylphenidate.   4. Continue to follow up with Psychiatry.   5. Stressed to her and her husband that she should not be  driving and that they should remove keys from the house when she is by herself.   6. Follow-up in 4 months.

## 2013-10-12 ENCOUNTER — Encounter: Payer: 59 | Admitting: Internal Medicine

## 2014-01-01 ENCOUNTER — Ambulatory Visit: Admit: 2014-01-01 | Payer: PRIVATE HEALTH INSURANCE

## 2014-01-01 DIAGNOSIS — Z006 Encounter for examination for normal comparison and control in clinical research program: Secondary | ICD-10-CM

## 2014-01-01 NOTE — Unmapped (Signed)
Patient was seen for ENROLL-HD follow-up #1 visit.  Prior to initiation of any study assessments, patient was re-consented with a new consent since Dr. Duker has taken over as PI.  After all questions were answered to the patient's satisfaction, the patient agreed to continue to participate and the consent form was signed.  The consent included the HIPAA authorization.  A copy of the consent form was given to the patient.      After study consent was signed, study assessments were completed and Dr. Duker completed a UHDRS.  Blood specimen was collected and processed according to protocol requirements.  All assessments are documented in the patient's study binder.

## 2014-01-01 NOTE — Unmapped (Signed)
Mrs. Rarick presents for followup in the ENROLL-HD study.    UHDRS 1: Motor Examination  Huntington's Disease Rating Scale  Ocular pursuit, horizontal: 1  Ocular pursuit, vertical: 1  Saccade initiation, horizontal: 1  Saccade initiation, vertical: 2  Saccade velocity, hortizontal : 1  Saccade velocity, vertical: 1  Dysarthria : 1  Tongue Protrusion: 0  Fingers taps, right: 2  Fingers taps, left : 2  Pronate/supinate hands, right : 1  Pronate/supinate hands, left : 1  Luria (fist-hand-palm test): 0  Rigidity arms, right : 1  Rigidity arms, left: 1  Body Bradykinesia: 1  Maximal dystonia, trunk: 0  Maximal dystonia, RUE: 1  Maximal dystonia, LUE: 1  Maximal dystonia, RLE: 1  Maximal dystonia, LLE: 1  Maximal chorea, face: 0  Maixmal chorea, BOL: 0  Maximal chorea, trunk: 0   Maximal chorea, RUE: 0  Maximal chorea, LUE: 0  Maximal chorea, RLE: 0  Maximal chorea, LLE: 0  Gait: 0  Tandem Walking: 1  Retropulsion Pull Test: 2  Diagnosis confidence level: 4  UHDRS 1 TOTAL:: 24

## 2014-01-24 ENCOUNTER — Encounter: Payer: PRIVATE HEALTH INSURANCE | Attending: Neurology

## 2014-01-24 NOTE — Unmapped (Signed)
I left message for pt to return my call.  I had to cancel her appt today with Dr. Gerilyn Pilgrim d/t physician being sick.  Please send her to me to reschedule.

## 2014-01-25 NOTE — Unmapped (Signed)
Caller/spouse concerned about pt just having to change from DR Revilla to Dr Gerilyn Pilgrim and now seeing NP Theron Arista until Dr Gerilyn Pilgrim next available in Dec - would like this explained to pt at 7/31 appt

## 2014-01-25 NOTE — Unmapped (Signed)
Appt scheduled.  Call complete.

## 2014-01-25 NOTE — Unmapped (Signed)
Spouse/caller relaying appt offered by MA Boling next Thurs 8/30 2:30 WCN Dr Gerilyn Pilgrim will work for pt and spouse, asking we cancl [facility] appt

## 2014-01-25 NOTE — Unmapped (Signed)
I spoke with pt's husband.  He is going to call me back to reschedule pt's appt.

## 2014-01-28 NOTE — Unmapped (Signed)
Spoke with patients husband. He is upset over receiving a bill for a free study the patient participated in on June 30 th. Stated the bill is over 100 dollars. Stated if he has to pay for a MD coming in for 5 minutes or less he will cancel the patients appt with Gerilyn Pilgrim on Thursday. Stated there is no reason to pay 2 bills. Please return his call.

## 2014-01-28 NOTE — Unmapped (Signed)
I spoke with Brenda [name] who did research visit.  She states she willl call pt and explain to disregard bill.  I will route to her to document after she has spoken with pt.

## 2014-01-31 ENCOUNTER — Ambulatory Visit: Admit: 2014-01-31 | Payer: PRIVATE HEALTH INSURANCE | Attending: Neurology

## 2014-01-31 DIAGNOSIS — G10 Huntington's disease: Secondary | ICD-10-CM

## 2014-01-31 NOTE — Unmapped (Signed)
Subjective:      Patient ID: Brenda [name] is a 58 y.o. female.    HPI   Movement Disorders HPI  The patient presents for follow-up.  She is more depressed as her fine motor skills are worsening.  She has a harder time folding clothes.  She continues to complain of dry mouth.  She continues to have anxiety.  She has had one fall.  Her weight is stable. She will cough while eating once a week. Her cognition is worse over time.  She continues to see Dr. Corinne Ports.     Initial hx as follows: Her initial history follows: she first noticed racy, speedy spells in December 2010. These spells consist of her mind racing, she starts speaking, her muscles get tight. Initially she thought these spells were due to taking steroids (for asthma). Shortly afterwards she noticed generalized involuntary movements. She describes them as twitches. She started dropping objects, since mid 2011. She denies difficulty walking, but she admits that her balance can be off at times. In November 2011 she was rocking in a chair and she fell backwards with the whole chair. In 2010 she was trying to step over a dog gate and she fell and hit her face; she suffered a left orbital fracture. She has noticed that she is bumping into things while walking. She still walks and plays golf. She denies hallucinations, delusions, lightheadedness, fainting spells, daytime sedation, nausea or vomiting. She denies memory problems. She admits occasional feelings of depression, especially in the winter. She has tried sertraline in the past as well as escitalopram but stopped these previously due to side effects. 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 She discontinued sertraline in the past. She did not feel the need to start tetrabenazine, partially because it would be difficult to get.     PD Quality Metrics  Falls (Review Each Visit): present  one fall  Depression (Review Annually): present      Anxiety (Review  Annually): present     Cognitive Impairment (Review Annually): present     Insomnia/Sleep Disturbance (Review Annually): denies                  Histories:     She has a past medical history of Asthma; Environmental allergies; Superficial injury of face without infection (04/12/2009); Hematoma, left eyebrow (04/18/2009); Huntington's chorea (08/11/2010); Esophageal reflux (08/10/2010); Osteopenia (08/10/2010); Fracture of left orbit (2010); Anxiety; Fatigue (05/24/2011); and Chronic sinusitis (05/24/2011).    She has past surgical history that includes Sinus surgery and Tonsillectomy.    Her family history includes Alzheimer's disease in her father; Anxiety disorder in her sister; Depression in her sister; Huntington's disease in her maternal uncle; Huntington's disease (age of onset: 74) in her mother; Pulmonary embolism in her mother; Suicidality in her sister.    She reports that she has never smoked. She has never used smokeless tobacco. She reports that she drinks about .6 ounces of alcohol per week. She reports that she does not use illicit drugs.      Review of Systems: As in HPI    Allergies:   Zithromax; Bactrim; Erythromycin; Erythromycin base; Methylphenidate; and Sulfa (sulfonamide antibiotics)    Medications:     Outpatient Encounter Prescriptions as of 01/31/2014   Medication Sig Dispense Refill   ??? cholecalciferol, vitamin D3, 1000 units tablet Take 1,000 Units by mouth daily.       ??? clonazePAM (KLONOPIN) 1 MG tablet  Take 2-3 tablets at bedtime as needed         ??? ibuprofen (ADVIL,MOTRIN) 600 MG tablet Take 600 mg by mouth every 6 hours as needed.       ??? lithium (ESKALITH) 450 MG CR tablet Take 450 mg by mouth At bedtime.       ??? MELATONIN ORAL Take by mouth.       ??? ranitidine (ZANTAC) 150 MG tablet TAKE ONE TABLET BY MOUTH TWO TIMES DAILY       ??? risperiDONE (RISPERDAL) 1 MG tablet Take 1.5 mg by mouth At bedtime.          ??? UNABLE TO FIND Calms forte  Take prn       ??? multivitamin (THERAGRAN) tablet Take  1 tablet by mouth daily.         No facility-administered encounter medications on file as of 01/31/2014.        Objective:       Blood pressure 102/60, pulse 88, height 5' 5 (1.651 m), weight 139 lb (63.05 kg), last menstrual period 05/20/2011.    Neurologic Exam    Physical Exam  UHDRS 1: Motor Examination  Huntington's Disease Rating Scale  Ocular pursuit, horizontal: 1  Ocular pursuit, vertical: 1  Saccade initiation, horizontal: 1  Saccade initiation, vertical: 1  Saccade velocity, hortizontal : 1  Saccade velocity, vertical: 1  Dysarthria : 1  Tongue Protrusion: 1  Fingers taps, right: 2  Fingers taps, left : 2  Pronate/supinate hands, right : 1  Pronate/supinate hands, left : 1  Luria (fist-hand-palm test): 3  Rigidity arms, right : 1  Rigidity arms, left: 1  Body Bradykinesia: 1  Maximal dystonia, trunk: 0  Maximal dystonia, RUE: 1  Maximal dystonia, LUE: 1  Maximal dystonia, RLE: 2  Maximal dystonia, LLE: 2  Maximal chorea, face: 0  Maixmal chorea, BOL: 0  Maximal chorea, trunk: 0   Maximal chorea, RUE: 0  Maximal chorea, LUE: 0  Maximal chorea, RLE: 0  Maximal chorea, LLE: 0  Gait: 0  Tandem Walking: 1  Retropulsion Pull Test: 1  Diagnosis confidence level: 4  UHDRS 1 TOTAL:: 28       Assessment:     1. Huntington's chorea    2. Anxiety and depression      She has family history of HD, and her history and examination are consistent with a clinical diagnosis of Huntington's disease, progressively worse over time. 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 She is taking risperidone, which has helped with psychiatric symptoms. She continues to take clonazepam which is helpful for anxiety and for chorea. She continues to complain of dry mouth.     Plan:     1. Continue risperidone 1.5  mg qhs by Dr. Corinne Ports.   2. Continue clonazepam.   3. Continue to follow up with Psychiatry.   4. Continue exercise.   5. Follow-up in 4 months.

## 2014-02-01 ENCOUNTER — Ambulatory Visit: Payer: PRIVATE HEALTH INSURANCE | Attending: Adult Health

## 2014-02-18 ENCOUNTER — Ambulatory Visit: Payer: Self-pay | Admitting: Ophthalmology

## 2014-02-18 ENCOUNTER — Other Ambulatory Visit: Payer: Self-pay | Admitting: Internal Medicine

## 2014-02-18 LAB — SEDIMENTATION RATE: ERYTHROCYTE SED RATE: 9 mm/h (ref 0–30)

## 2014-02-18 NOTE — Telephone Encounter (Signed)
lmom to schedule appt

## 2014-02-18 NOTE — Telephone Encounter (Signed)
She returned Dr. Consuello ClossSparnall's call.

## 2014-02-18 NOTE — Telephone Encounter (Signed)
Left another message about scheduling an appt

## 2014-02-18 NOTE — Telephone Encounter (Signed)
See telephone encounter for SD appt

## 2014-02-18 NOTE — Telephone Encounter (Signed)
Ok to use sd for tomorrow

## 2014-02-18 NOTE — Telephone Encounter (Signed)
Having bouts of diarrhea and not sure what to do in the meantime. She has an appt with a gastroenterology doctor 03-15-14 and she's going to be calling tomorrow for a sameday availability but wonders what to do about it now just in case she can't get in? Also calling to get allergy testing done on her by Dr. Mercer Pod.

## 2014-02-19 NOTE — Telephone Encounter (Signed)
Same Day?

## 2014-02-19 NOTE — Telephone Encounter (Signed)
yes

## 2014-02-19 NOTE — Telephone Encounter (Signed)
ov

## 2014-02-19 NOTE — Telephone Encounter (Signed)
She has diarrhea and imodium is not helping. What should she do?

## 2014-02-19 NOTE — Telephone Encounter (Signed)
Appointment has been scheduled

## 2014-02-21 NOTE — Progress Notes (Signed)
Subjective:      Patient ID: Danielle Lawrence is a 58 y.o. female.    HPI    Review of Systems    Objective:   Physical Exam    Assessment:            Plan:      Refer for mammogram. Encouraged patient to get pap smear

## 2014-02-21 NOTE — Progress Notes (Signed)
Subjective:      Danielle Lawrence ID: Danielle Lawrence is a 58 y.o. female.    HPI   Here with c/o diarrhea.  Danielle Lawrence also has a h/o anxiety and Huntington disease.  Diarrhea began one month ago.  Occurs after eating fatty foods.  Develops diffuse abdominal cramping which is relieved shortly after the bowel movement.  Diarrhea begins almost immediately after eating. Seeing GI next month.  Has gotten some relief with imodium. Danielle Lawrence states that Danielle Lawrence anxiety is improved.  Danielle Lawrence does feel that Danielle Lawrence huntington's chorea is progressing.  Feels more "spacey".     Review of Systems   Constitutional: Negative for unexpected weight change.   Respiratory: Negative for cough, chest tightness, shortness of breath and wheezing.    Cardiovascular: Negative for chest pain, palpitations and leg swelling.   Gastrointestinal: Positive for abdominal pain and diarrhea. Negative for nausea, vomiting, constipation and abdominal distention.   Musculoskeletal: Negative for myalgias, back pain and arthralgias.   Neurological: Negative for tremors and numbness.   Psychiatric/Behavioral: Positive for confusion and decreased concentration.   All other systems reviewed and are negative.      Objective:   Physical Exam   Constitutional: Danielle Lawrence is oriented to person, place, and time. Danielle Lawrence appears well-developed and well-nourished. No distress.   HENT:   Right Ear: External ear normal.   Left Ear: External ear normal.   Mouth/Throat: Oropharynx is clear and moist. No oropharyngeal exudate.   Neck: Neck supple. No thyromegaly present.   Cardiovascular: Normal rate, regular rhythm and normal heart sounds.    Pulmonary/Chest: Effort normal and breath sounds normal. No respiratory distress. Danielle Lawrence has no wheezes. Danielle Lawrence has no rales.   Abdominal: Soft. Bowel sounds are normal. Danielle Lawrence exhibits no distension. There is no tenderness. There is no rebound and no guarding.   Musculoskeletal: Danielle Lawrence exhibits no edema.   Neurological: Danielle Lawrence is alert and oriented to person, place, and time.    Skin: Danielle Lawrence is not diaphoretic.       Assessment:      1. Diarrhea: Possible IBS. Symptoms not progressive.  F/u with GI. Continue imodium  2. Anxiety:  Stable. Continue current regimen  3. Huntington's disease:progressing. F/u with Neurology      Plan:

## 2014-02-21 NOTE — Progress Notes (Signed)
Subjective:      Patient ID: Danielle Lawrence is a 58 y.o. female.    HPI    Review of Systems    Objective:   Physical Exam    Assessment:            Plan:      See above plan

## 2014-04-03 ENCOUNTER — Encounter

## 2014-04-03 NOTE — Telephone Encounter (Signed)
Patient has appointment next week with Dr. Jeanelle MallingSparnall; she would like to do blood work tomorrow morning.  Please enter order into Epic.

## 2014-04-03 NOTE — Telephone Encounter (Signed)
ordered

## 2014-04-04 DIAGNOSIS — M25511 Pain in right shoulder: Secondary | ICD-10-CM

## 2014-04-04 DIAGNOSIS — G8929 Other chronic pain: Secondary | ICD-10-CM | POA: Insufficient documentation

## 2014-04-04 LAB — CBC WITH AUTO DIFFERENTIAL
Basophils %: 0.5 %
Basophils Absolute: 0 10*3/uL (ref 0.0–0.2)
Eosinophils %: 1.9 %
Eosinophils Absolute: 0.1 10*3/uL (ref 0.0–0.6)
Hematocrit: 41.8 % (ref 36.0–48.0)
Hemoglobin: 14.1 g/dL (ref 12.0–16.0)
Lymphocytes %: 16.8 %
Lymphocytes Absolute: 1.2 10*3/uL (ref 1.0–5.1)
MCH: 30.8 pg (ref 26.0–34.0)
MCHC: 33.7 g/dL (ref 31.0–36.0)
MCV: 91.4 fL (ref 80.0–100.0)
MPV: 10.2 fL (ref 5.0–10.5)
Monocytes %: 5.2 %
Monocytes Absolute: 0.4 10*3/uL (ref 0.0–1.3)
Neutrophils %: 75.6 %
Neutrophils Absolute: 5.4 10*3/uL (ref 1.7–7.7)
Platelets: 222 10*3/uL (ref 135–450)
RBC: 4.57 M/uL (ref 4.00–5.20)
RDW: 13.5 % (ref 12.4–15.4)
WBC: 7.2 10*3/uL (ref 4.0–11.0)

## 2014-04-04 LAB — COMPREHENSIVE METABOLIC PANEL
ALT: 15 U/L (ref 10–40)
AST: 20 U/L (ref 15–37)
Albumin/Globulin Ratio: 1.7 (ref 1.1–2.2)
Albumin: 5 g/dL (ref 3.4–5.0)
Alkaline Phosphatase: 80 U/L (ref 40–129)
Anion Gap: 17 — ABNORMAL HIGH (ref 3–16)
BUN: 15 mg/dL (ref 7–20)
CO2: 24 mmol/L (ref 21–32)
Calcium: 10.2 mg/dL (ref 8.3–10.6)
Chloride: 101 mmol/L (ref 99–110)
Creatinine: 0.8 mg/dL (ref 0.6–1.1)
GFR African American: >60 (ref >60)
GFR Non-African American: >60 (ref >60)
Globulin: 3 g/dL
Glucose: 80 mg/dL (ref 70–99)
Potassium: 4.1 mmol/L (ref 3.5–5.1)
Sodium: 142 mmol/L (ref 136–145)
Total Bilirubin: 0.6 mg/dL (ref 0.0–1.0)
Total Protein: 8 g/dL (ref 6.4–8.2)

## 2014-04-04 LAB — LIPID PANEL
Cholesterol, Total: 215 mg/dL — ABNORMAL HIGH (ref 0–199)
HDL: 69 mg/dL — ABNORMAL HIGH (ref 40–60)
LDL Calculated: 123 mg/dL — ABNORMAL HIGH (ref <100)
Triglycerides: 116 mg/dL (ref 0–150)
VLDL Cholesterol Calculated: 23 mg/dL

## 2014-04-04 LAB — VITAMIN D 25 HYDROXY: Vit D, 25-Hydroxy: 33.4 ng/mL (ref >=30)

## 2014-04-11 ENCOUNTER — Ambulatory Visit: Admit: 2014-04-11 | Discharge: 2014-04-11 | Payer: BLUE CROSS/BLUE SHIELD | Attending: Internal Medicine

## 2014-04-11 DIAGNOSIS — Z0000 Encounter for general adult medical examination without abnormal findings: Secondary | ICD-10-CM

## 2014-04-11 NOTE — Progress Notes (Signed)
Subjective:      Patient ID: Danielle Lawrence is a 58 y.o. female.    HPI  Here for a well exam.  Huntington's symptoms have been progressing.  Plans to travel to visit her [place] in [place].    Past Medical History, Medications, Social History, Family History, Health Maintenance and Labs have been reviewed with Danielle CambridgeLee C Thole.    Review of Systems   Constitutional: Negative for unexpected weight change.   Respiratory: Negative for cough, chest tightness, shortness of breath and wheezing.    Cardiovascular: Negative for chest pain, palpitations and leg swelling.   Gastrointestinal: Negative for nausea, vomiting, diarrhea, constipation and abdominal distention.   Musculoskeletal: Negative for myalgias, back pain and arthralgias.   Neurological: Positive for tremors, speech difficulty and weakness. Negative for numbness.   All other systems reviewed and are negative.      Objective:   Physical Exam   Constitutional: She is oriented to person, place, and time. She appears well-developed and well-nourished. No distress.   HENT:   Right Ear: External ear normal.   Left Ear: External ear normal.   Mouth/Throat: Oropharynx is clear and moist. No oropharyngeal exudate.   Neck: Neck supple. No thyromegaly present.   Cardiovascular: Normal rate, regular rhythm and normal heart sounds.    Pulmonary/Chest: Effort normal and breath sounds normal. No respiratory distress. She has no wheezes. She has no rales.   Abdominal: Soft. She exhibits no distension. There is no tenderness. There is no rebound and no guarding.   Musculoskeletal: She exhibits no edema.   Neurological: She is alert and oriented to person, place, and time.   Unsteady gait; slurred speech   Skin: She is not diaphoretic.       Assessment:      Well exam: Will schedule pap.  Mammogram ordered.  F/u with Neurology. Continue medications.  See dentis q 6 months. Prevnar and flu vaccine today      Plan:     see above plan

## 2014-04-11 NOTE — Telephone Encounter (Signed)
Needs an order for a dexa scan put into Epic - call her if you have any questions [phone]

## 2014-04-12 ENCOUNTER — Encounter

## 2014-04-12 LAB — BASIC METABOLIC PANEL
BUN: 10 mg/dL (ref 4–21)
CREATININE: 1 mg/dL (ref 0.5–1.1)
Glucose: 84 mg/dL
Potassium: 4.5 mmol/L (ref 3.4–5.3)
SODIUM: 140 mmol/L (ref 137–147)

## 2014-04-12 LAB — HEPATIC FUNCTION PANEL
ALT: 20 U/L (ref 7–35)
AST: 23 U/L (ref 13–35)
Alkaline Phosphatase: 69 U/L (ref 25–125)
Bilirubin, Total: 0.3 mg/dL

## 2014-04-12 LAB — CBC AND DIFFERENTIAL
HCT: 43 % (ref 36–46)
Hemoglobin: 14.2 g/dL (ref 12.0–16.0)
Neutrophils Absolute: 4 /uL
Platelets: 187 10*3/uL (ref 150–399)
WBC: 7.2 10^3/mL

## 2014-04-12 LAB — TSH: TSH: 1.51 u[IU]/mL (ref 0.41–5.90)

## 2014-04-12 NOTE — Telephone Encounter (Signed)
Placed order for patient in Orders Only Encounter.

## 2014-05-23 ENCOUNTER — Ambulatory Visit: Admit: 2014-05-23 | Discharge: 2014-05-23 | Payer: BLUE CROSS/BLUE SHIELD | Attending: Nurse Practitioner

## 2014-05-23 DIAGNOSIS — Z01419 Encounter for gynecological examination (general) (routine) without abnormal findings: Secondary | ICD-10-CM

## 2014-05-23 NOTE — Progress Notes (Signed)
Annual GYN Visit      Danielle CambridgeLee C Spangler  Date of Birth:  [birth year]    Date of Service:  05/23/2014    Chief Complaint:   Danielle Lawrence is a 58 y.o. female who presents for routine annual gynecologic visit.    HPI:  Patient is post menopausal- LMP about one year ago.  She complains of hot flashes.  Menopausal/perimenopausal symptoms: hot flashes.  No HRT    Allergies   Allergen Reactions   ??? Ritalin [Methylphenidate Hcl] Hives   ??? Zithromax [Azithromycin Dihydrate] Anaphylaxis and Other (See Comments)     abdominal pain   ??? Bactrim Hives   ??? Erythromycin Diarrhea     Outpatient Prescriptions Marked as Taking for the 05/23/14 encounter (Office Visit) with Benjiman Corearol [place] Wherry, CNP   Medication Sig Dispense Refill   ??? omeprazole (PRILOSEC) 20 MG capsule Take 40 mg by mouth daily     ??? ranitidine (ZANTAC) 150 MG tablet Take 150 mg by mouth 2 times daily.     ??? lithium 300 MG capsule Take 300 mg by mouth daily 300mg  at night and 150 mg at night.     ??? UNABLE TO FIND Calms     ??? risperiDONE (RISPERDAL) 0.5 MG tablet Take 1.5 mg by mouth nightly.     ??? clonazePAM (KLONOPIN) 1 MG tablet Take 1 mg by mouth 4 times daily.       ??? multivitamin (THERAGRAN) per tablet Take 1 tablet by mouth daily.       ??? ibuprofen (ADVIL;MOTRIN) 600 MG tablet Take 600 mg by mouth every 6 hours as needed.         Preventive Care and Risk Factor Assessment  Health Maintenance   Topic Date Due   ??? TETANUS VACCINE ADULT (11 YEARS AND UP)  02/14/1968   ??? PNEUMOVAX 1 DOSE 19-64Y (1) 02/14/1975   ??? BREAST CANCER SCREENING  06/22/2012   ??? CERVICAL CANCER SCREENING  07/05/2014 (Originally 12/03/2013)   ??? FLU VACCINE YEARLY (ADULT)  02/03/2015   ??? COLON CANCER SCREENING COLONOSCOPY  11/08/2017      Hx abnormal PAP: no  Sexual activity: single partner, contraception - vasectomy.  Hx of STD: no  Hx of abnormal mammogram: no  Self-breast exams: yes  FH of breast cancer: no  FH of GYN cancer: no   Previous DEXA scan: yes- osteopenia, abnormal:   Exercise: elliptical 30  minutes a day; 7/7   History   Smoking status   ??? Never Smoker    Smokeless tobacco   ??? Never Used      History   Alcohol Use No        Immunization History   Administered Date(s) Administered   ??? Influenza Virus Vaccine 04/23/2004, 03/22/2012, 03/30/2013, 04/11/2014   ??? Pneumococcal 13-valent Conjugate (Prevnar13) 04/11/2014   ??? Zoster 12/24/2012       Review of Systems:  No new complaints today    Wt Readings from Last 3 Encounters:   04/11/14 138 lb (62.596 kg)   02/21/14 134 lb (60.782 kg)   05/23/13 133 lb (60.328 kg)     BP Readings from Last 3 Encounters:   05/23/14 110/78   04/11/14 122/70   02/21/14 120/76       Physical Exam:  Filed Vitals:    05/23/14 1252   BP: 110/78   Height: 5' 4.5" (1.638 m)      There is no weight on file to calculate BMI.  Constitutional: She  is oriented to person, place, and time. She appears well-developed and well-nourished. No distress.   Neck: No mass and no thyromegaly present.   Cardiovascular: Normal rate, regular rhythm and normal heart sounds.    No murmur heard.  Pulmonary/Chest: Effort and breath sounds normal.   Abdominal: Soft, non-tender.  No distension or masses.    Genitourinary: normal external genitalia, vulva, vagina, cervix, uterus and adnexa and Rectal:  rectal normal, no masses, guaiac negative.  Breast exam:  breasts appear normal, no suspicious masses, no skin or nipple changes or axillary nodes, symmetric fibrous changes bilaterally.  Neurological: She is alert and oriented to person, place, and time.   Skin: Skin is warm and dry. No rash noted. No erythema.   Psychiatric: She has a normal mood and affect. Her behavior is normal.     Assessment/Plan:  Well gyn with pap\\  Huntingtons disease  Osteopenia    She is scheduled for mammogram and DEXA tomorrow  tdap given today

## 2014-05-24 ENCOUNTER — Inpatient Hospital Stay: Admit: 2014-05-24 | Attending: Family

## 2014-05-24 ENCOUNTER — Encounter: Admit: 2014-05-24

## 2014-05-24 ENCOUNTER — Encounter

## 2014-05-24 DIAGNOSIS — Z139 Encounter for screening, unspecified: Secondary | ICD-10-CM

## 2014-05-28 NOTE — Progress Notes (Signed)
lmom 

## 2014-06-04 NOTE — Progress Notes (Signed)
Called patient,lmom.

## 2014-06-13 ENCOUNTER — Ambulatory Visit: Admit: 2014-06-13 | Payer: PRIVATE HEALTH INSURANCE | Attending: Neurology

## 2014-06-13 DIAGNOSIS — G10 Huntington's disease: Secondary | ICD-10-CM

## 2014-06-13 MED ORDER — traZODone (DESYREL) 50 MG tablet
50 | ORAL_TABLET | Freq: Every evening | ORAL | Status: AC
Start: 2014-06-13 — End: 2014-08-06

## 2014-06-13 NOTE — Unmapped (Signed)
Subjective:      Patient ID: Brenda [name] is a 58 y.o. female.    HPI   Movement Disorders HPI    The patient presents for follow-up.  She is having more problems with falling asleep.  She has occasional dysphagia.  Mood is fairly stable.  She denies any falls.  Her weight has been stable.  She continues to see Dr. Corinne Ports in psychiatry.  She is taking Adderall now as needed for sleepiness.  She takes clonazepam at bedtime for sleep but has been taking over the prescribed amount.      Her initial history follows: she first noticed racy, speedy spells in December 2010. These spells consist of her mind racing, she starts speaking, her muscles get tight. Initially she thought these spells were due to taking steroids (for asthma). Shortly afterwards she noticed generalized involuntary movements. She describes them as twitches. She started dropping objects, since mid 2011. She denies difficulty walking, but she admits that her balance can be off at times. In November 2011 she was rocking in a chair and she fell backwards with the whole chair. In 2010 she was trying to step over a dog gate and she fell and hit her face; she suffered a left orbital fracture. She has noticed that she is bumping into things while walking. She still walks and plays golf. She denies hallucinations, delusions, lightheadedness, fainting spells, daytime sedation, nausea or vomiting. She denies memory problems. She admits occasional feelings of depression, especially in the winter. She has tried sertraline in the past as well as escitalopram but stopped these previously due to side effects. 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 She discontinued sertraline in the past. She did not feel the need to start tetrabenazine, partially because it would be difficult to get.          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    She has past surgical history that includes Sinus surgery and Tonsillectomy.    Her family history includes Alzheimer's disease in her father; Anxiety disorder in her sister; Depression in her sister; Huntington's disease in her maternal uncle; Huntington's disease (age of onset: 48) in her mother; Pulmonary embolism in her mother; Suicidality in her sister.    She reports that she has never smoked. She has never used smokeless tobacco. She reports that she drinks about 0.6 oz of alcohol per week. She reports that she does not use illicit drugs.      Review of Systems: As in HPI    Allergies:   Zithromax; Bactrim; Erythromycin; Erythromycin base; Methylphenidate; and Sulfa (sulfonamide antibiotics)    Medications:     Outpatient Encounter Prescriptions as of 06/13/2014   Medication Sig Dispense Refill   ??? cholecalciferol, vitamin D3, 1000 units tablet Take 1,000 Units by mouth daily.     ??? clonazePAM (KLONOPIN) 1 MG tablet Take 2-3 tablets at bedtime as needed       ??? lithium (ESKALITH) 450 MG CR tablet Take 450 mg by mouth At bedtime.     ??? MELATONIN ORAL Take by mouth.     ??? risperiDONE (RISPERDAL) 1 MG tablet Take 1.5 mg by mouth At bedtime.        ???  UNABLE TO FIND Calms forte  Take prn       No facility-administered encounter medications on file as of 06/13/2014.        Objective:       Last menstrual period 05/20/2011.    Neurologic Exam    Physical Exam  UHDRS 1: Motor Examination  Huntington's Disease Rating Scale  Ocular pursuit, horizontal: 1  Ocular pursuit, vertical: 1  Saccade initiation, horizontal: 1  Saccade initiation, vertical: 3  Saccade velocity, hortizontal : 1  Saccade velocity, vertical: 2  Dysarthria : 1  Tongue Protrusion: 1  Fingers taps, right:  3  Fingers taps, left : 2  Pronate/supinate hands, right : 2  Pronate/supinate hands, left : 2  Luria (fist-hand-palm test): 1  Rigidity arms, right : 1  Rigidity arms, left: 1  Body Bradykinesia: 1  Maximal dystonia, trunk: 0  Maximal dystonia, RUE: 1  Maximal dystonia, LUE: 1  Maximal dystonia, RLE: 1  Maximal dystonia, LLE: 1  Maximal chorea, face: 0  Maixmal chorea, BOL: 0  Maximal chorea, trunk: 0   Maximal chorea, RUE: 0  Maximal chorea, LUE: 0  Maximal chorea, RLE: 0  Maximal chorea, LLE: 0  Gait: 3  Tandem Walking: 3  Retropulsion Pull Test: 3  Diagnosis confidence level: 4  UHDRS 1 TOTAL:: 37       Assessment:     1. Huntington's disease      She has family history of HD, and her history and examination are consistent with a clinical diagnosis of Huntington's disease, progressively worse over time. 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 She is taking risperidone, which has helped with psychiatric symptoms. She continues to take clonazepam which is helpful for anxiety and for chorea. She is complaining of increased sleep difficulties and is taking more clonazepam at bedtime. Her balance is worse over time as well as cognition.     Plan:     1. Continue risperidone 2.5?? mg qhs by Dr. Corinne Ports. ??  2. Continue clonazepam however recommended she take no more than what is prescribed by Dr. Corinne Ports.   3. Will add trazodone 50 mg qhs for sleep. ??  4. Continue to follow up with Psychiatry. ??  5. Continue exercise. ??No driving.   6. Follow-up in 5 months.

## 2014-07-01 DIAGNOSIS — D696 Thrombocytopenia, unspecified: Secondary | ICD-10-CM | POA: Insufficient documentation

## 2014-07-18 ENCOUNTER — Encounter: Payer: PRIVATE HEALTH INSURANCE | Attending: Neurology

## 2014-08-07 MED ORDER — traZODone (DESYREL) 50 MG tablet
50 | ORAL_TABLET | Freq: Every evening | ORAL | Status: AC
Start: 2014-08-07 — End: 2014-11-01

## 2014-08-08 NOTE — Unmapped (Signed)
Trazodone e-scribed to pharm on file.  Complete

## 2014-08-08 NOTE — Unmapped (Signed)
Tra

## 2014-09-17 ENCOUNTER — Encounter
Admit: 2014-09-17 | Disposition: A | Discharge status: Home / Self Care | Payer: Self-pay | Attending: Family Medicine | Admitting: Family Medicine

## 2014-10-04 ENCOUNTER — Encounter
Admit: 2014-10-04 | Disposition: A | Discharge status: Home / Self Care | Payer: Self-pay | Attending: Family Medicine | Admitting: Family Medicine

## 2014-10-17 ENCOUNTER — Encounter: Attending: Internal Medicine

## 2014-10-28 NOTE — Unmapped (Signed)
I left message for pt's husband to return my call.

## 2014-10-28 NOTE — Unmapped (Signed)
I spoke with pt's husband.  He states pt's balance is off considerably and noticeably different.  Pt having trouble showering, dressing, reading very tired.  Pt did see Dr. Corinne Ports on 10/21/14 and cut back on risperidone to 1 mg 2 tabs qd but he states he checked her meds last night and pt still taking 2.5 tabs.  Husband concerned with how fast the progression of pt's Huntington's disease.  Pt is currently in [place].  I did schedule appt for Friday 11/01/14 with Louanna Raw NP.  Husband would like message sent to Dr. Gerilyn Pilgrim.

## 2014-10-28 NOTE — Unmapped (Signed)
Pt's husband calling to speak with MA or MD. Reports that pt has changed noticeably in the past 3 days. States pt seems unstable, harder for her to get around. They went to visit the grandkids over the weekend and husband said that pt had a terrible time. Pt's appt is 5/13, is there something sooner? Requests call back.

## 2014-10-28 NOTE — Unmapped (Signed)
Husband returning MA call.

## 2014-10-28 NOTE — Unmapped (Signed)
If she has worsened significantly in the last few days I would be concerned about an infection like a UTI.  If she is worsening to the point she can't walk would recommend he take her to get evaluated in the ED.  Is someone monitoring how she is taking her medications at home?  IF not another possibility would be she is taking her medications incorrectly (too much clonazepam, etc).  It may be time to set up home health to have someone help with medication management. Agree with having her evaluated sooner by Marisue Humble when they get back in town.  Disease progression as the cause of her decline is possible however would evaluate for the above given the rapidity at which her symptoms have changed.

## 2014-10-31 NOTE — Unmapped (Signed)
I spoke with pt's husband.  I relayed message from Dr. Gerilyn Pilgrim.  He confirmed pt's appt for tomorrow.  He would like for Marisue Humble to suggest to pt to allow husband to help set up her meds.  He also asked about possible home health services.  I will route to Louanna Raw NP to review for tomorrows appt.  Call complete

## 2014-11-01 ENCOUNTER — Ambulatory Visit: Admit: 2014-11-01 | Payer: PRIVATE HEALTH INSURANCE | Attending: Adult Health

## 2014-11-01 DIAGNOSIS — G4700 Insomnia, unspecified: Secondary | ICD-10-CM

## 2014-11-01 MED ORDER — traZODone (DESYREL) 100 MG tablet
100 | ORAL_TABLET | Freq: Every evening | ORAL | Status: AC
Start: 2014-11-01 — End: 2016-03-16

## 2014-11-01 NOTE — Unmapped (Signed)
Subjective:      Patient ID: Brenda [name] is a 59 y.o. female.    HPI   Movement Disorders HPI    The patient presents for urgent follow-up.?? She is having more problems with falling asleep.  She says the clonazepam isn't working any more.  Trazodone has improved her sleep but she needs two instead of one.?? She has occasional dysphagia.?? Mood is fairly stable.?? She denies any falls.?? Her weight has been stable.?? She continues to see Dr. Corinne Ports in psychiatry.?? She is taking Adderall now as needed for sleepiness.?? She takes clonazepam at bedtime for sleep but has been taking over the prescribed amount. She seemed confused about her medications and when she takes them.  She told me she had a bag for the morning meds and a bag for the night time medications.  When I asked if her husband could help her with medications she was quite adament that she did not want him to help her.    ??Her initial history follows: she first noticed racy, speedy spells in December 2010. These spells consist of her mind racing, she starts speaking, her muscles get tight. Initially she thought these spells were due to taking steroids (for asthma). Shortly afterwards she noticed generalized involuntary movements. She describes them as twitches. She started dropping objects, since mid 2011. She denies difficulty walking, but she admits that her balance can be off at times. In November 2011 she was rocking in a chair and she fell backwards with the whole chair. In 2010 she was trying to step over a dog gate and she fell and hit her face; she suffered a left orbital fracture. She has noticed that she is bumping into things while walking. She still walks and plays golf. She denies hallucinations, delusions, lightheadedness, fainting spells, daytime sedation, nausea or vomiting. She denies memory problems. She admits occasional feelings of depression, especially in the winter. She has tried sertraline in the past as well as  escitalopram but stopped these previously due to side effects. 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 She discontinued sertraline in the past. She did not feel the need to start tetrabenazine, partially because it would be difficult to get. ??      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    She has past surgical history that includes Sinus surgery and Tonsillectomy.    Her family history includes Alzheimer's disease in her father; Anxiety disorder in her sister; Depression in her sister; Huntington's disease in her maternal uncle; Huntington's disease (age of onset: 49) in her mother; Pulmonary embolism in her mother; Suicidality in her sister.    She reports that she has never smoked. She has never used smokeless tobacco. She reports that she drinks about 0.6 oz of alcohol per week. She reports that she does not  use illicit drugs.      Review of Systems   Constitutional: Positive for fatigue.   HENT: Negative for trouble swallowing.    Respiratory: Negative for cough, choking, chest tightness and shortness of breath.    Cardiovascular: Negative for leg swelling.   Gastrointestinal: Positive for constipation.   Musculoskeletal: Positive for gait problem.   Neurological: Positive for speech difficulty.   Psychiatric/Behavioral: Positive for depression, confusion and sleep disturbance. The patient  is nervous/anxious.        Allergies:   Zithromax; Bactrim; Erythromycin; Erythromycin base; Methylphenidate; and Sulfa (sulfonamide antibiotics)    Medications:     Outpatient Encounter Prescriptions as of 11/01/2014   Medication Sig Dispense Refill   ??? clonazePAM (KLONOPIN) 1 MG tablet Take 1/2 tablet 1-3 times daily prn and take 2-3 tablets at bedtime as needed       ??? dextroamphetamine-amphetamine (AMPHETAMINE-DEXTROAMPHETAMINE) 5 mg Tab Take 5 mg by mouth every morning.     ??? lithium (ESKALITH) 450 MG CR tablet Take 450 mg by mouth At bedtime.     ??? mirabegron (MYBETRIQ) 50 mg Tb24 Take 50 mg by mouth daily. Indications: Bladder Hyperactivity     ??? omeprazole (PRILOSEC) 40 MG capsule Take 40 mg by mouth every morning before breakfast. Indications: Gastroesophageal Reflux     ??? risperiDONE (RISPERDAL) 1 MG tablet Take 2.5 mg by mouth At bedtime.        ??? traZODone (DESYREL) 50 MG tablet Take 1 tablet (50 mg total) by mouth at bedtime. Indications: SLEEP 90 tablet 3   ??? UNABLE TO FIND Calms forte  Take prn     ??? cholecalciferol, vitamin D3, 1000 units tablet Take 1,000 Units by mouth daily.     ??? [DISCONTINUED] MELATONIN ORAL Take by mouth.       No facility-administered encounter medications on file as of 11/01/2014.        Objective:       Blood pressure 140/95, pulse 85, height 5' 4.5 (1.638 m), weight 140 lb (63.504 kg), last menstrual period 05/20/2011.    Neurologic Exam  UHDRS 1: Motor Examination  Huntington's Disease Rating Scale  Ocular pursuit, horizontal: 1  Ocular pursuit, vertical: 1  Saccade initiation, horizontal: 1  Saccade initiation, vertical: 3  Saccade velocity, hortizontal : 1  Saccade velocity, vertical: 3  Dysarthria : 1  Tongue Protrusion: 1  Fingers taps, right: 3  Fingers taps, left : 2  Pronate/supinate hands, right : 2  Pronate/supinate hands, left : 2  Luria (fist-hand-palm test): 1  Rigidity arms, right : 1  Rigidity arms, left: 1  Body Bradykinesia: 1  Maximal dystonia, trunk:  0  Maximal dystonia, RUE: 1  Maximal dystonia, LUE: 1  Maximal dystonia, RLE: 1  Maximal dystonia, LLE: 1  Maximal chorea, face: 0  Maixmal chorea, BOL: 0  Maximal chorea, trunk: 0   Maximal chorea, RUE: 0  Maximal chorea, LUE: 0  Maximal chorea, RLE: 0  Maximal chorea, LLE: 0  Gait: 3  Tandem Walking: 3  Retropulsion Pull Test: 3  Diagnosis confidence level: 4  UHDRS 1 TOTAL:: 38    Montreal Cognitive Assesment  Montreal Cognitive Assessment (MoCA)   Alternate Trail Marking : 1  Visuoconstructional Skills (Cube): 1  Visuoconstructional Skills (Clock)  Contour:: 1  Numbers:: 0  Hands:: 1  Visuospatial/Executive Total:: 4     Naming  Lion:: 1  Rhinoceros:: 1  Camel:: 1  Naming Total:: 3  Memory  1st Trial: Velvet, Church,  Daisy, Red  2nd Trial: Face, Velvet, Daisy, Red  Attention  Forward Digit Span:: 1  Backward Digit Span:: 1  Vigilance:: 1  Serial 7's: 93, 86, 79, 72, 65  Serial 7's Total:: 3  Attention Score:: 6  Language  Sentence Repetition:: 2  Verbal Fluency:: 0  Language Total Score: 2  Abstraction  Similarities:: 2  Delayed Recall:  Delayed Recall Total:: 2  Orientation:  Date:: 1  Month:: 1  Year:: 1  Day:: 1  Place:: 1  City:: 1  Orientation Total: 6  Add 1 point if education is less than or equal to 12 years:: 0  Total Score:: 25      Physical Exam       Assessment:     1.?? Huntington's disease ??   2.     Mild Cognitive Impairment    She has family history of HD, and her history and examination are consistent with a clinical diagnosis of Huntington's disease, progressively worse over time. 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 She is taking risperidone, which has helped with psychiatric symptoms. She is requiring more trazodone for sleep and I will increase to 100 mg. She wants to discontinue clonazepam since it is not helping with sleep.  I explained this has to be done very slowly. Her balance is worse over time as well as cognition. A MoCA today  was 25/30.  I expressed concern about her handling her own medications and tried to encourage her to have her husband assist her.               Plan:     1. Continue risperidone 2.5?? mg qhs by Dr. Corinne Ports. ??  2. Continue clonazepam however recommended she take no more than what is prescribed by Dr. Corinne Ports. ??  3. Increase trazodone to 100 mg qhs for sleep. ??  4. Continue to follow up with Psychiatry. ??  5. Continue exercise. ??No driving. ??  6. Follow-up in 2 weeks with Dr. Gerilyn Pilgrim.   7. Decrease clonazepam to 3 tabs at bedtime for one week.  8. Decrease clonazepam to 2 tabs at bedtime for one week.  9. Decrease clonazepam to 1 tab at bedtime for one week.    Time spent with patient:   Established:    45 min  More than 50% of this time was spent discussing:   * Diagnosis  * Management  * Treatment Plan

## 2014-11-01 NOTE — Unmapped (Signed)
1.  Decrease clonazepam to 3 tabs at bedtime for one week.  2.  Decrease clonazepam to 2 tabs at bedtime for one week.  3.  Decrease clonazepam to 1 tab at bedtime for one week.

## 2014-11-14 ENCOUNTER — Ambulatory Visit: Admit: 2014-11-14 | Payer: PRIVATE HEALTH INSURANCE | Attending: Neurology

## 2014-11-14 DIAGNOSIS — G10 Huntington's disease: Secondary | ICD-10-CM

## 2014-11-14 MED ORDER — clonazePAM (KLONOPIN) 1 MG tablet
1 | Freq: Every evening | ORAL | Status: AC
Start: 2014-11-14 — End: 2015-10-21

## 2014-11-14 NOTE — Unmapped (Signed)
Subjective:      Patient ID: Brenda [name] is a 59 y.o. female.    HPI   Movement Disorders HPI  The patient presents for follow-up. She was having more difficulty with walking and muscle soreness over the last few weeks which was attributed to over doing it.  This is now back to her baseline from this standpoint however her condition has progressed overall.  She has had falls in her closet. She denies any depression but later says maybe she is slightly depressed.  She continues to see Dr. Corinne Ports and is on risperidone and lithium. Sleep continues to be a main issue however she goes back and forth saying I can't sleep then says she is tired all the time.      Her initial history follows: she first noticed racy, speedy spells in December 2010. These spells consist of her mind racing, she starts speaking, her muscles get tight. Initially she thought these spells were due to taking steroids (for asthma). Shortly afterwards she noticed generalized involuntary movements. She describes them as twitches. She started dropping objects, since mid 2011. She denies difficulty walking, but she admits that her balance can be off at times. In November 2011 she was rocking in a chair and she fell backwards with the whole chair. In 2010 she was trying to step over a dog gate and she fell and hit her face; she suffered a left orbital fracture. She has noticed that she is bumping into things while walking. She still walks and plays golf. She denies hallucinations, delusions, lightheadedness, fainting spells, daytime sedation, nausea or vomiting. She denies memory problems. She admits occasional feelings of depression, especially in the winter. She has tried sertraline in the past as well as escitalopram but stopped these previously due to side effects. 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 She discontinued sertraline in the past. She did not feel the need to start tetrabenazine,  partially because it would be difficult to get. ??    PD Quality Metrics  Falls (Review Each Visit): present     Depression (Review Annually): present      Anxiety (Review Annually): present     Cognitive Impairment (Review Annually): present     Insomnia/Sleep Disturbance (Review Annually): present                  Histories:     She has a past medical history of Asthma; Environmental allergies; Superficial injury of face without infection (04/12/2009); Hematoma, left eyebrow (04/18/2009); Huntington's chorea (08/11/2010); Esophageal reflux (08/10/2010); Osteopenia (08/10/2010); Fracture of left orbit (2010); Anxiety; Fatigue (05/24/2011); and Chronic sinusitis (05/24/2011).    She has past surgical history that includes Sinus surgery and Tonsillectomy.    Her family history includes Alzheimer's disease in her father; Anxiety disorder in her sister; Depression in her sister; Huntington's disease in her maternal uncle; Huntington's disease (age of onset: 50) in her mother; Pulmonary embolism in her mother; Suicidality in her sister.    She reports that she has never smoked. She has never used smokeless tobacco. She reports that she drinks about 0.6 oz of alcohol per week. She reports that she does not use illicit drugs.      Review of Systems: as in HPI    Allergies:   Zithromax; Bactrim; Erythromycin; Erythromycin base; Methylphenidate; and Sulfa (sulfonamide antibiotics)    Medications:     Outpatient Encounter Prescriptions as of 11/14/2014   Medication Sig Dispense  Refill   ??? cholecalciferol, vitamin D3, 1000 units tablet Take 1,000 Units by mouth daily.     ??? clonazePAM (KLONOPIN) 1 MG tablet Take 3 tablets at bedtime as needed       ??? dextroamphetamine-amphetamine (AMPHETAMINE-DEXTROAMPHETAMINE) 5 mg Tab Take 5 mg by mouth every morning.     ??? lithium (ESKALITH) 450 MG CR tablet Take 450 mg by mouth At bedtime.     ??? mirabegron (MYBETRIQ) 50 mg Tb24 Take 50 mg by mouth daily. Indications: Bladder Hyperactivity     ???  omeprazole (PRILOSEC) 40 MG capsule Take 40 mg by mouth every morning before breakfast. Indications: Gastroesophageal Reflux     ??? risperiDONE (RISPERDAL) 1 MG tablet Take 2.5 mg by mouth At bedtime.        ??? traZODone (DESYREL) 100 MG tablet Take 1 tablet (100 mg total) by mouth at bedtime. Indications: SLEEP 90 tablet 3   ??? UNABLE TO FIND Calms forte  Take prn       No facility-administered encounter medications on file as of 11/14/2014.        Objective:       Blood pressure 122/80, pulse 100, height 5' 4.5 (1.638 m), weight 140 lb (63.504 kg), last menstrual period 05/20/2011.    Neurologic Exam    Physical Exam  UHDRS 1: Motor Examination  Huntington's Disease Rating Scale  Ocular pursuit, horizontal: 1  Ocular pursuit, vertical: 1  Saccade initiation, horizontal: 1  Saccade initiation, vertical: 2  Saccade velocity, hortizontal : 1  Saccade velocity, vertical: 2  Dysarthria : 1  Tongue Protrusion: 1  Fingers taps, right: 3  Fingers taps, left : 2  Pronate/supinate hands, right : 2  Pronate/supinate hands, left : 2  Luria (fist-hand-palm test): 3  Rigidity arms, right : 1  Rigidity arms, left: 1  Body Bradykinesia: 1  Maximal dystonia, trunk: 0  Maximal dystonia, RUE: 1  Maximal dystonia, LUE: 1  Maximal dystonia, RLE: 1  Maximal dystonia, LLE: 1  Maximal chorea, face: 0  Maixmal chorea, BOL: 0  Maximal chorea, trunk: 0   Maximal chorea, RUE: 0  Maximal chorea, LUE: 0  Maximal chorea, RLE: 0  Maximal chorea, LLE: 0  Gait: 2  Tandem Walking: 3  Retropulsion Pull Test: 3  Diagnosis confidence level: 4  UHDRS 1 TOTAL:: 37       Assessment:     1. Huntington's chorea      She has family history of HD, and her history and examination are consistent with a clinical diagnosis of Huntington's disease, progressively worse over time. 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 She is taking risperidone, which has helped with psychiatric symptoms. Due to grogginess during  the day her clonazepam has been decreased over time. Her trazodone was recently increased 2 weeks ago to help with sleep.  Her balance is worse over time as well as cognition.     Plan:     1. Continue risperidone 2.5?? mg qhs by Dr. Corinne Ports. ??  2. Continue clonazepam however recommended she take only 2 tablets at bedtime to prevent excessive sleepiness since trazodone was increased to 100 mg recently.   3. Continue trazodone 100 mg qhs for sleep. ??  4. Continue to follow up with Psychiatry. ??  5. Continue exercise. Will order home health, PT and OT for balance issues/falls.  6. Follow-up in 3 months with Marisue Humble.

## 2014-12-04 ENCOUNTER — Ambulatory Visit: Admit: 2014-12-04 | Discharge: 2014-12-04 | Payer: BLUE CROSS/BLUE SHIELD | Attending: Family Medicine

## 2014-12-04 DIAGNOSIS — S80212A Abrasion, left knee, initial encounter: Secondary | ICD-10-CM

## 2014-12-04 NOTE — Patient Instructions (Signed)
Cover with non-stick guaze pad, apply polysporin topically
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
## 2014-12-05 ENCOUNTER — Emergency Department: Admit: 2014-12-05 | Payer: BLUE CROSS/BLUE SHIELD

## 2014-12-05 ENCOUNTER — Inpatient Hospital Stay: Admit: 2014-12-05 | Discharge: 2014-12-05 | Payer: BLUE CROSS/BLUE SHIELD | Attending: Emergency Medicine

## 2014-12-05 DIAGNOSIS — W19XXXA Unspecified fall, initial encounter: Secondary | ICD-10-CM

## 2014-12-05 LAB — COMPREHENSIVE METABOLIC PANEL
ALT: 15 U/L (ref 10–40)
AST: 24 U/L (ref 15–37)
Albumin/Globulin Ratio: 1.4 (ref 1.1–2.2)
Albumin: 4.3 g/dL (ref 3.4–5.0)
Alkaline Phosphatase: 62 U/L (ref 40–129)
Anion Gap: 13 (ref 3–16)
BUN: 14 mg/dL (ref 7–20)
CO2: 24 mmol/L (ref 21–32)
Calcium: 9.6 mg/dL (ref 8.3–10.6)
Chloride: 101 mmol/L (ref 99–110)
Creatinine: 0.7 mg/dL (ref 0.6–1.1)
GFR African American: >60 (ref >60)
GFR Non-African American: >60 (ref >60)
Globulin: 3 g/dL
Glucose: 104 mg/dL — ABNORMAL HIGH (ref 70–99)
Potassium: 5.1 mmol/L (ref 3.5–5.1)
Sodium: 138 mmol/L (ref 136–145)
Total Bilirubin: 0.4 mg/dL (ref 0.0–1.0)
Total Protein: 7.3 g/dL (ref 6.4–8.2)

## 2014-12-05 LAB — CBC WITH AUTO DIFFERENTIAL
Basophils %: 1 %
Basophils Absolute: 0.1 10*3/uL (ref 0.0–0.2)
Eosinophils %: 1.6 %
Eosinophils Absolute: 0.1 10*3/uL (ref 0.0–0.6)
Hematocrit: 41.3 % (ref 36.0–48.0)
Hemoglobin: 13.7 g/dL (ref 12.0–16.0)
Lymphocytes %: 11.7 %
Lymphocytes Absolute: 0.9 10*3/uL — ABNORMAL LOW (ref 1.0–5.1)
MCH: 29.3 pg (ref 26.0–34.0)
MCHC: 33.1 g/dL (ref 31.0–36.0)
MCV: 88.5 fL (ref 80.0–100.0)
MPV: 8.9 fL (ref 5.0–10.5)
Monocytes %: 6.3 %
Monocytes Absolute: 0.5 10*3/uL (ref 0.0–1.3)
Neutrophils %: 79.4 %
Neutrophils Absolute: 6 10*3/uL (ref 1.7–7.7)
Platelets: 213 10*3/uL (ref 135–450)
RBC: 4.67 M/uL (ref 4.00–5.20)
RDW: 13.3 % (ref 12.4–15.4)
WBC: 7.5 10*3/uL (ref 4.0–11.0)

## 2014-12-05 MED ORDER — CYCLOBENZAPRINE HCL 10 MG PO TABS
10 MG | Freq: Once | ORAL | Status: AC
Start: 2014-12-05 — End: 2014-12-05
  Administered 2014-12-05: 17:00:00 10 mg via ORAL

## 2014-12-05 MED ORDER — KETOROLAC TROMETHAMINE 30 MG/ML IJ SOLN
30 MG/ML | Freq: Once | INTRAMUSCULAR | Status: AC
Start: 2014-12-05 — End: 2014-12-05
  Administered 2014-12-05: 17:00:00 30 mg via INTRAVENOUS

## 2014-12-05 MED ORDER — CYCLOBENZAPRINE HCL 10 MG PO TABS
10 MG | ORAL_TABLET | Freq: Three times a day (TID) | ORAL | Status: AC | PRN
Start: 2014-12-05 — End: 2014-12-15

## 2014-12-05 MED FILL — CYCLOBENZAPRINE HCL 10 MG PO TABS: 10 MG | ORAL | Qty: 1

## 2014-12-05 MED FILL — KETOROLAC TROMETHAMINE 30 MG/ML IJ SOLN: 30 MG/ML | INTRAMUSCULAR | Qty: 1

## 2014-12-05 NOTE — ED Notes (Signed)
Aircast applied to left foot. RN:Jen notified.     Hamilton CapriHailee J Young  12/05/14 1437

## 2014-12-05 NOTE — ED Provider Notes (Addendum)
CHIEF COMPLAINT  Fall; Back Pain; and Foot Pain      HISTORY OF PRESENT ILLNESS  Danielle Lawrence is a 59 y.o. female with a history of Huntington's, anxiety, asthma, and GERD who presents to the ED complaining of fall with foot and back pain.  Patient reports that she has frequent falls secondary to her Huntington's.  Today she reports that she fell at the base of the stairs.  She did not fall down the stairs.  Patient reports that she hurt her low back, and left ankle.  She reports that she had difficulty ambulating after the fall.  She did not strike her head or lose consciousness.  No other complaints.   No other complaints, modifying factors or associated symptoms.     I have reviewed the following from the nursing documentation.    Past Medical History   Diagnosis Date   ??? Seasonal allergies    ??? GERD (gastroesophageal reflux disease)    ??? Osteopenia    ??? Anxiety    ??? Asthma    ??? Huntington's chorea [facility])      Past Surgical History   Procedure Laterality Date   ??? Sinus surgery  2010     deviated septum   ??? Tonsillectomy and adenoidectomy  1968   ??? Wisdom tooth extraction  1978     Family History   Problem Relation Age of Onset   ??? Alzheimer's Disease Father    ??? Depression Sister    ??? Other Mother      Huntingtons disease     History     Social History   ??? Marital Status: Married     Spouse Name: N/A     Number of Children: N/A   ??? Years of Education: N/A     Occupational History   ??? Not on file.     Social History Main Topics   ??? Smoking status: Never Smoker    ??? Smokeless tobacco: Never Used   ??? Alcohol Use: No   ??? Drug Use: No   ??? Sexual Activity:     Partners: Male     Other Topics Concern   ??? Not on file     Social History Narrative     No current facility-administered medications for this encounter.     Current Outpatient Prescriptions   Medication Sig Dispense Refill   ??? cyclobenzaprine (FLEXERIL) 10 MG tablet Take 1 tablet by mouth 3 times daily as needed for Muscle spasms 30 tablet 0   ??? omeprazole  (PRILOSEC) 20 MG capsule Take 40 mg by mouth daily     ??? ranitidine (ZANTAC) 150 MG tablet Take 150 mg by mouth 2 times daily.     ??? lithium 300 MG capsule Take 300 mg by mouth daily  at night and 150 mg at night.     ??? UNABLE TO FIND Calms     ??? risperiDONE (RISPERDAL) 0.5 MG tablet Take 1.5 mg by mouth nightly.     ??? clonazePAM (KLONOPIN) 1 MG tablet Take 1 mg by mouth 4 times daily.       ??? multivitamin (THERAGRAN) per tablet Take 1 tablet by mouth daily.       ??? Calcium Carbonate-Vitamin D (CALCIUM + D) 600-200 MG-UNIT TABS Take 1 tablet by mouth daily.       ??? ibuprofen (ADVIL;MOTRIN) 600 MG tablet Take 600 mg by mouth every 6 hours as needed.       Allergies  Allergen Reactions   ??? Ritalin [Methylphenidate Hcl] Hives   ??? Zithromax [Azithromycin Dihydrate] Anaphylaxis and Other (See Comments)     abdominal pain   ??? Bactrim Hives   ??? Erythromycin Diarrhea   ??? Escitalopram Oxalate        REVIEW OF SYSTEMS  10 systems reviewed, pertinent positives per HPI otherwise noted to be negative.    PHYSICAL EXAM  BP 145/60 mmHg   Pulse 89   Temp(Src) 98.1 ??F (36.7 ??C) (Oral)   Resp 16   Wt 135 lb (61.236 kg)   SpO2 98%  GENERAL APPEARANCE: Awake and alert. Cooperative. No acute distress.  HEAD: Normocephalic. Atraumatic.  EYES: PERRL. EOM's grossly intact.   ENT: Mucous membranes are moist.   NECK: Supple, trachea midline.   HEART: RRR. Normal S1S2, no rubs, gallops, or murmurs noted  LUNGS: Respirations unlabored. CTAB. Good air exchange. No wheezes, rales, or rhonchi.  Speaking comfortably in full sentences.   ABDOMEN: Soft. Non-distended. Non-tender. No guarding or rebound. Normal bowel sounds.  EXTREMITIES: No peripheral edema. MAEE. No acute deformities.  Musculoskeletal: No midline tenderness of cervical, thoracic, or lumbar spine.  There is moderate bilateral lower lumbar tenderness involving the paraspinal muscles.  Left ankle: Moderate tenderness along the anterior malleoli.  No significant swelling, or  bruising noted.  SKIN: Warm and dry. No acute rashes.   NEUROLOGICAL: Alert and oriented X 3. No gross facial drooping. Strength 5/5, sensation intact. Normal coordination. No pronator drift.  Gait normal.   PSYCHIATRIC: Normal mood and affect.    LABS  I have reviewed all labs for this visit.   Results for orders placed or performed during the hospital encounter of 12/05/14   CBC auto differential   Result Value Ref Range    WBC 7.5 4.0 - 11.0 K/uL    RBC 4.67 4.00 - 5.20 M/uL    Hemoglobin 13.7 12.0 - 16.0 g/dL    Hematocrit 16.1 09.6 - 48.0 %    MCV 88.5 80.0 - 100.0 fL    MCH 29.3 26.0 - 34.0 pg    MCHC 33.1 31.0 - 36.0 g/dL    RDW 04.5 40.9 - 81.1 %    Platelets 213 135 - 450 K/uL    MPV 8.9 5.0 - 10.5 fL    Neutrophils Relative 79.4 %    Lymphocytes Relative 11.7 %    Monocytes Relative 6.3 %    Eosinophils Relative Percent 1.6 %    Basophils Relative 1.0 %    Neutrophils Absolute 6.0 1.7 - 7.7 K/uL    Lymphocytes Absolute 0.9 (L) 1.0 - 5.1 K/uL    Monocytes Absolute 0.5 0.0 - 1.3 K/uL    Eosinophils Absolute 0.1 0.0 - 0.6 K/uL    Basophils Absolute 0.1 0.0 - 0.2 K/uL   Comprehensive Metabolic Panel   Result Value Ref Range    Sodium 138 136 - 145 mmol/L    Potassium 5.1 3.5 - 5.1 mmol/L    Chloride 101 99 - 110 mmol/L    CO2 24 21 - 32 mmol/L    Anion Gap 13 3 - 16    Glucose 104 (H) 70 - 99 mg/dL    BUN 14 7 - 20 mg/dL    CREATININE 0.7 0.6 - 1.1 mg/dL    GFR Non-African American >60 >60    GFR African American >60 >60    Calcium 9.6 8.3 - 10.6 mg/dL    Total Protein 7.3 6.4 - 8.2 g/dL  Alb 4.3 3.4 - 5.0 g/dL    Albumin/Globulin Ratio 1.4 1.1 - 2.2    Total Bilirubin 0.4 0.0 - 1.0 mg/dL    Alkaline Phosphatase 62 40 - 129 U/L    ALT 15 10 - 40 U/L    AST 24 15 - 37 U/L    Globulin 3.0 g/dL         RADIOLOGY  X-RAYS:  I have reviewed radiologic plain film image(s).  ALL OTHER NON-PLAIN FILM IMAGES SUCH AS CT, ULTRASOUND AND MRI HAVE BEEN READ BY THE RADIOLOGIST.  XR Lumbar Spine Standard   Final Result    IMPRESSION:   Minimal spondylolisthesis at L5-S1 is likely secondary to bilateral L5   spondylolysis.  No definite acute injury.         XR Ankle Left Standard   Final Result   IMPRESSION:   No evidence of an acute fracture.                    Rechecks: 2:10 PM: Symptoms improved.  Family/patient does not wish to have urine tested at this time.        ED COURSE/MDM  Patient seen and evaluated. Old records reviewed. Labs and imaging reviewed and results discussed with patient. Patient was given Toradol, and Flexeril in the ED with good symptomatic relief. Patient was reassessed as noted.  Patient's labs, and imaging appears stable. . No acute pathology was noted and pt is safe for discharge home to follow up with PCP. Plan of care discussed with patient and family. Patient and family in agreement with plan.     Patient was given scripts for the following medications. I counseled patient how to take these medications.   New Prescriptions    CYCLOBENZAPRINE (FLEXERIL) 10 MG TABLET    Take 1 tablet by mouth 3 times daily as needed for Muscle spasms       CLINICAL IMPRESSION  1. Fall from standing, initial encounter    2. Bilateral low back pain without sciatica    3. Sprain of left ankle, unspecified ligament, initial encounter        Blood pressure 145/60, pulse 89, temperature 98.1 ??F (36.7 ??C), temperature source Oral, resp. rate 16, weight 135 lb (61.236 kg), SpO2 98 %, not currently breastfeeding.    DISPOSITION  Lorenza CambridgeLee C Rozeboom was discharged to home in stable condition.         Phineas SemenWilliam D Rotha, DO  12/05/14 1411    Phineas SemenWilliam D Kysa, DO  12/05/14 [phone]

## 2014-12-16 ENCOUNTER — Encounter: Payer: Self-pay | Admitting: Family Medicine

## 2014-12-16 DIAGNOSIS — Z86711 Personal history of pulmonary embolism: Secondary | ICD-10-CM | POA: Insufficient documentation

## 2014-12-23 NOTE — Unmapped (Signed)
Noted.  I will wait for forms.  Call complete

## 2014-12-23 NOTE — Unmapped (Signed)
Spoke w/pt's husband. Has some disability forms that need to be completed and is time sensitive. Will drop off either today or tomorrow.

## 2014-12-27 NOTE — Unmapped (Signed)
Disability forms completed and mailed to [facility] [address] [place], [place] [address].   Copy sent to scanning and mailed to pt's home.  Call complete

## 2015-01-03 ENCOUNTER — Ambulatory Visit (INDEPENDENT_AMBULATORY_CARE_PROVIDER_SITE_OTHER): Payer: Managed Care, Other (non HMO) | Admitting: Family Medicine

## 2015-01-03 ENCOUNTER — Encounter (INDEPENDENT_AMBULATORY_CARE_PROVIDER_SITE_OTHER): Payer: Self-pay

## 2015-01-03 ENCOUNTER — Encounter: Payer: Self-pay | Admitting: Family Medicine

## 2015-01-03 VITALS — BP 122/80 | HR 103 | Temp 97.7°F | Resp 16 | Ht 64.5 in | Wt 249.6 lb

## 2015-01-03 DIAGNOSIS — G8929 Other chronic pain: Secondary | ICD-10-CM | POA: Diagnosis not present

## 2015-01-03 DIAGNOSIS — L509 Urticaria, unspecified: Secondary | ICD-10-CM | POA: Diagnosis not present

## 2015-01-03 DIAGNOSIS — E668 Other obesity: Secondary | ICD-10-CM | POA: Insufficient documentation

## 2015-01-03 DIAGNOSIS — D696 Thrombocytopenia, unspecified: Secondary | ICD-10-CM

## 2015-01-03 DIAGNOSIS — R7989 Other specified abnormal findings of blood chemistry: Secondary | ICD-10-CM | POA: Diagnosis not present

## 2015-01-03 DIAGNOSIS — E559 Vitamin D deficiency, unspecified: Secondary | ICD-10-CM | POA: Diagnosis not present

## 2015-01-03 DIAGNOSIS — N904 Leukoplakia of vulva: Secondary | ICD-10-CM | POA: Insufficient documentation

## 2015-01-03 DIAGNOSIS — Z86711 Personal history of pulmonary embolism: Secondary | ICD-10-CM | POA: Diagnosis not present

## 2015-01-03 DIAGNOSIS — E669 Obesity, unspecified: Secondary | ICD-10-CM | POA: Insufficient documentation

## 2015-01-03 DIAGNOSIS — Z8611 Personal history of tuberculosis: Secondary | ICD-10-CM | POA: Insufficient documentation

## 2015-01-03 DIAGNOSIS — N6019 Diffuse cystic mastopathy of unspecified breast: Secondary | ICD-10-CM | POA: Insufficient documentation

## 2015-01-03 DIAGNOSIS — G43109 Migraine with aura, not intractable, without status migrainosus: Secondary | ICD-10-CM | POA: Insufficient documentation

## 2015-01-03 DIAGNOSIS — M5412 Radiculopathy, cervical region: Secondary | ICD-10-CM | POA: Insufficient documentation

## 2015-01-03 DIAGNOSIS — M545 Low back pain: Secondary | ICD-10-CM

## 2015-01-03 DIAGNOSIS — M25562 Pain in left knee: Secondary | ICD-10-CM | POA: Diagnosis not present

## 2015-01-03 DIAGNOSIS — I10 Essential (primary) hypertension: Secondary | ICD-10-CM | POA: Insufficient documentation

## 2015-01-03 DIAGNOSIS — D6862 Lupus anticoagulant syndrome: Secondary | ICD-10-CM | POA: Insufficient documentation

## 2015-01-03 MED ORDER — POTASSIUM CHLORIDE 20 MEQ PO PACK: 20.0000 meq | PACK | Freq: Every day | ORAL | Status: DC

## 2015-01-03 MED ORDER — LIDOCAINE 5 % EX PTCH: 2.0000 | MEDICATED_PATCH | CUTANEOUS | Status: DC

## 2015-01-03 MED ORDER — HYDROXYZINE HCL 25 MG PO TABS: 25.0000 mg | ORAL_TABLET | Freq: Three times a day (TID) | ORAL | Status: DC | PRN

## 2015-01-03 MED ORDER — FUROSEMIDE 20 MG PO TABS: 40.0000 mg | ORAL_TABLET | Freq: Every day | ORAL | Status: DC | PRN

## 2015-01-03 MED ORDER — LOSARTAN POTASSIUM-HCTZ 50-12.5 MG PO TABS: 1.0000 | ORAL_TABLET | Freq: Every day | ORAL | Status: DC

## 2015-01-03 NOTE — Progress Notes (Signed)
Name: Chelsea Werner   MRN: [id number]    DOB: 07-07-1955   Date:01/03/2015       Progress Note  Subjective  Chief Complaint  Chief Complaint  Patient presents with  . Follow-up    1 month  . Hypertension    Patient states that she has been checking blood pressure at home and it has been running 120/80.  Marland Kitchen Knee Pain    Patient complains of left knee pain, states that she fell 4 weeks ago and still having pain in knee.    HPI  HTN; bp is at goal, denies side effects, diagnosed with HTN during admission in December for PE. She is doing well on medication, taking Lasix prn now, but losartan hctz daily .  Anterior knee pain: she fell at her house and landed on left anterior knee about one month ago, initially it was swollen and very tender, but now swelling is down, but has an anterior knee pain, worse at the end of the day.   PE: seen by hematologist and was advised to stop Eliquis after 6 months of therapy, still has some SOB with activity, but doing better, no palpitation, no calf pain, will stop medication once she finish current package  Hives: seldom has episodes, but needs refill of medication  Chronic back pain: daily symptoms, aching like, occasionally has radiculitis down left leg, uses patches daily and seems to control symptoms    Patient Active Problem List   Diagnosis Date Noted  . Fibrocystic breast disease 01/03/2015  . Cervical radiculitis 01/03/2015  . Extreme obesity 01/03/2015  . Migraine with aura and without status migrainosus, not intractable 01/03/2015  . LA (lupus anticoagulant) disorder 01/03/2015  . 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 Benign hypertension 01/03/2015  . Vitamin D deficiency 01/03/2015  . History of TB (tuberculosis) 01/03/2015  . Hives 01/03/2015  . History of pulmonary embolus (PE) 12/16/2014  . Thrombocytopenia 07/01/2014  . Edema   . Sacral back pain 04/10/2012  . Chronic low back pain   . Iritis, chronic   . Allergic  rhinitis   . Personal history of tuberculosis 10/26/2007    Past Surgical History  Procedure Laterality Date  . Breast biopsy  1988, 2008  . Abdominal hysterectomy  1999-2000  . Back surgery  1984  . Myomectomy  1988  . Plantar fasciectomy  2013  . Cesarean section      Family History  Problem Relation Age of Onset  . Early death Mother   . Hypertension Father   . Thyroid cancer Sister   . Deep vein thrombosis Sister   . Parkinson's disease Brother   . Prostate cancer Brother     History   Social History  . Marital Status: Widowed    Spouse Name: N/A  . Number of Children: 1  . Years of Education: N/A   Occupational History  . 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 Smoking status: Never Smoker   . Smokeless tobacco: Never Used  . Alcohol Use: 0.0 oz/week    0 Standard drinks or equivalent per week     Comment: occasional  . Drug Use: No  . Sexual Activity: Not on file   Other Topics Concern  . Not on file   Social History Narrative   Regular exercise-no   Caffeine Use-yes     Current outpatient prescriptions:  .  ALLERGY RELIEF/NASAL DECONGEST 10-240 MG per 24  hr tablet, TAKE 1 TABLET BY MOUTH DAILY., Disp: 30 tablet, Rfl: 3 .  furosemide (LASIX) 20 MG tablet, Take 2 tablets (40 mg total) by mouth daily as needed for fluid or edema., Disp: 90 tablet, Rfl: 1 .  hydrOXYzine (ATARAX/VISTARIL) 25 MG tablet, Take 1 tablet (25 mg total) by mouth 3 (three) times daily as needed., Disp: 60 tablet, Rfl: 0 .  Hypromellose (GENTEAL) 0.3 % SOLN, Place 1 drop into both eyes daily., Disp: , Rfl:  .  lidocaine (LIDODERM) 5 %, Place 2 patches onto the skin daily., Disp: 60 patch, Rfl: 5 .  loratadine (CLARITIN) 10 MG tablet, Take 1 tablet by mouth daily., Disp: , Rfl:  .  losartan-hydrochlorothiazide (HYZAAR) 50-12.5 MG per tablet, Take 1 tablet by mouth daily., Disp: 30 tablet, Rfl: 5 .  Multiple Vitamins-Minerals (WOMENS  MULTIVITAMIN PLUS) TABS, Take 1 tablet by mouth daily., Disp: , Rfl:  .  potassium chloride (KLOR-CON) 20 MEQ packet, Take 20 mEq by mouth daily., Disp: 30 tablet, Rfl: 5 .  prednisoLONE acetate (PRED FORTE) 1 % ophthalmic suspension, Place 1 drop into both eyes every other day., Disp: 5 mL, Rfl: 1 .  Probiotic Product (PROBIOTIC FORMULA PO), Take 1 capsule by mouth daily., Disp: , Rfl:  .  clobetasol cream (TEMOVATE) 0.05 %, Apply 1 application topically as needed., Disp: , Rfl:  .  hydrocortisone valerate cream (WESTCORT) 0.2 %, Apply topically., Disp: , Rfl:   Allergies  Allergen Reactions  . Ace Inhibitors Cough  . Nsaids Hives  . Sulfa Antibiotics Hives     ROS  Constitutional: Negative for fever or weight change.  Respiratory: Negative for cough but mild  shortness of breath.   Cardiovascular: Negative for chest pain or palpitations.  Gastrointestinal: Negative for abdominal pain, no bowel changes.  Musculoskeletal: Negative for gait problem or joint swelling.  Skin: Negative for rash.  Neurological: Negative for dizziness or headache.  No other specific complaints in a complete review of systems (except as listed in HPI above).   Objective  Filed Vitals:   01/03/15 1620  BP: 122/80  Pulse: 103  Temp: 97.7 F (36.5 C)  TempSrc: Oral  Resp: 16  Height: 5' 4.5" (1.638 m)  Weight: 249 lb 9.6 oz (113.218 kg)  SpO2: 98%    Body mass index is 42.2 kg/(m^2).  Physical Exam   Constitutional: Patient appears well-developed and well-nourished. No distress.Obese  Eyes:  No scleral icterus. , mild injections of both eyes Neck: Normal range of motion. Neck supple. Cardiovascular: Normal rate, regular rhythm and normal heart sounds.  No murmur heard.trace of lower extremity edema Pulmonary/Chest: Effort normal and breath sounds normal. No respiratory distress. Abdominal: Soft.  There is no tenderness. Psychiatric: Patient has a normal mood and affect. behavior is normal.  Judgment and thought content normal. Muscular skeletal: grinding with extension of left knee, no effusion, redness or increase in warmth   PHQ2/9: Depression screen PHQ 2/9 01/03/2015  Decreased Interest 0  Down, Depressed, Hopeless 0  PHQ - 2 Score 0    Fall Risk: Fall Risk  01/03/2015  Falls in the past year? Yes  Number falls in past yr: 1  Injury with Fall? Yes   Assessment & Plan  1. Benign hypertension At goal, continue medication - potassium chloride (KLOR-CON) 20 MEQ packet; Take 20 mEq by mouth daily.  Dispense: 30 tablet; Refill: 5 - losartan-hydrochlorothiazide (HYZAAR) 50-12.5 MG per tablet; Take 1 tablet by mouth daily.  Dispense: 30 tablet; Refill:  5 - furosemide (LASIX) 20 MG tablet; Take 2 tablets (40 mg total) by mouth daily as needed for fluid or edema.  Dispense: 90 tablet; Refill: 1 - Lipid Profile - Comprehensive Metabolic Panel (CMET)  2. History of pulmonary embolus (PE) Stopping Eliquis, last CT chest neg in FEb  3. Thrombocytopenia Recheck labs - CBC  4. Vitamin D deficiency  - Vitamin D (25 hydroxy)  5. Hives  - hydrOXYzine (ATARAX/VISTARIL) 25 MG tablet; Take 1 tablet (25 mg total) by mouth 3 (three) times daily as needed.  Dispense: 60 tablet; Refill: 0  6. Abnormal TSH  - TSH  7. Chronic low back pain Continue medication - lidocaine (LIDODERM) 5 %; Place 2 patches onto the skin daily.  Dispense: 60 patch; Refill: 5   8. Left anterior knee pain Advised ice, lidoderm patch, topical nsaid's, if no improvement call back for referral to PT

## 2015-01-03 NOTE — Patient Instructions (Signed)

## 2015-01-10 ENCOUNTER — Ambulatory Visit: Admit: 2015-01-10 | Payer: PRIVATE HEALTH INSURANCE

## 2015-01-10 DIAGNOSIS — Z006 Encounter for examination for normal comparison and control in clinical research program: Secondary | ICD-10-CM

## 2015-01-10 NOTE — Unmapped (Signed)
Brenda [name] was evaluated as part of the ENROLL-HD study. All study-related IRB-approved assessments were carried out uneventfully.       UHDRS 1: Motor Examination  Huntington's Disease Rating Scale  Ocular pursuit, horizontal: 1  Ocular pursuit, vertical: 1  Saccade initiation, horizontal: 2  Saccade initiation, vertical: 2  Saccade velocity, hortizontal : 2  Saccade velocity, vertical: 2  Dysarthria : 1  Tongue Protrusion: 1  Fingers taps, right: 2  Fingers taps, left : 2  Pronate/supinate hands, right : 2  Pronate/supinate hands, left : 2  Luria (fist-hand-palm test): 1  Rigidity arms, right : 1  Rigidity arms, left: 2  Body Bradykinesia: 2  Maximal dystonia, trunk: 0  Maximal dystonia, RUE: 1  Maximal dystonia, LUE: 2  Maximal dystonia, RLE: 1  Maximal dystonia, LLE: 2  Maximal chorea, face: 0  Maixmal chorea, BOL: 0  Maximal chorea, trunk: 0   Maximal chorea, RUE: 0  Maximal chorea, LUE: 0  Maximal chorea, RLE: 0  Maximal chorea, LLE: 0  Gait: 2  Tandem Walking: 3  Retropulsion Pull Test: 3  Diagnosis confidence level: 4  UHDRS 1 TOTAL:: 40

## 2015-01-15 NOTE — Unmapped (Addendum)
Patient was seen for ENROLL-HD follow-up visit #2. Prior to initiation of any study assessments, patient was asked to re-consent due to the change of payment.  After all questions were answered to the patient's satisfaction, the patient agreed to continue to participate and the consent form was signed. The consent included the HIPAA authorization. The patient checked yes to all the additional questions in the consent.  A copy of the consent form was given to the patient.     After study consent was signed, study assessments were completed and Dr. Lutricia Feil completed a UHDRS. Blood specimen was collected and processed according to protocol requirements. All assessments are documented in the patient's study binder.    UHDRS 2: Non-motor Symptoms  Cognitive Assessment  Verbal Fluency Test: 20  Symbol Digit Modalities: 16  Stroop Color Naming (number correct): 51  Stroop Word Reading (number correct): 42  Stroop Interference: 29  Behavior Assessment  Depressed Mood Frequency: sometimes, at least once a week  Depressed Mood Severity : mild, responds to redirection and reassurance  Low Self-Esteem Guilt  Frequency: sometimes, at least once a week  Low Self-Esteem/ Guilt Severity : mild, definitley present  Anxiety Frequency: never or almost never  Anxiety Severity: no evidence  Suicidal Thoughts Frequency: not thinking about suicide or self harm  Suicidal Thoughts Severity : no suicidal thouhgts  Disruptive or Aggressive Behavior Frequency: never or almost never  Disruptive or Aggressive Behavior Severity : behavior well-controlled  Irritable Behavior Frequency: never or almost never  Irritable Behavior Severity: behavior well-controlled  Perservation/Obsessional Thinking Frequncy: sometimes, at least once a week  Preservative / Obsessional Severity: moderate- gets stuck on certain ideas, difficult to redirect  Compulsive Behavior Frequency: never or almost never  Compulsive Behavior Severity : behavior always  well-controlled  Delusions Frequency: never or almost never  Delusions Severity: no evidence  Hallucinations Frequency: no evidence  Hallucinations Severity: no evidence  Apathy Frequency: never  Apathy Severity: no evidence  Does the examiner believe the subject is confused?: No  Does the examiner believe the subject is demented?: No  Does the examiner believe the subject is depressed?: Yes  Does the subject require pharmacotherapy for depression?: Yes  Does the subject require pharmacotherapy for irritability?: No  Was the Behavioral Assessment information obtained from?: Subject and family/companion  Functional Assessment  Could subject engage in gainful employment in his/her accustomed work?: No  Could subject engage in any kind of gainful employment?: No  Could subject engage in any kind of volunteer or non-gainful work?: No  Could subject manage 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 : No  Can subject do his/her housework without any help?: No  Can subject do his/her laundry without any help?: No  Could subject prepare his/her own meals without any help?: No  Can subject use the telephone without any help?: Yes  Could subject take his/her own medications without help?: Yes  Could subject feed himself/herself without help?: No  Could subject dress himself/herself without help?: No  Could subject bathe himself/herself without help?: Yes  Could subject use public transportation to get places without help?: No  Could subject walk to places in his/her neighborhood without help?: Yes  Could subject walk without falling?: Yes  Could subject walk  without help?: Yes  Could subject comb hair without help?: Yes  Could subject transfer between chairs without help?:  Yes  Could subject get in and out of bed without help?: Yes  Could subject use toilet/commode without help?: Yes  Could subject's care still be provided at home?: Yes  Was the Functional Assessment information obtained from?: Subject and family/companion  Independence Scale  Please indicate the most accurate current level of subject's independence: 060  Functional Capacity  Occupation: Unable  Finances: unable  Domestic chores: impaired  ADLs: gross tasks only  Care level: 2  Was the Behavioral Assessment information obtained from?: Subject and family/companion  Clinical Summary  What was the purpose of the visit: 2  Since your last assessment of the subject, in you opinion, has the subject: 2  Since your last assesment, does the subject feel: 2  Based on the entire UHDRS do you believe with a confidence level . 99% that this subject : Yes

## 2015-01-17 ENCOUNTER — Encounter: Payer: Self-pay | Admitting: Family Medicine

## 2015-01-17 ENCOUNTER — Ambulatory Visit (INDEPENDENT_AMBULATORY_CARE_PROVIDER_SITE_OTHER): Payer: Managed Care, Other (non HMO) | Admitting: Family Medicine

## 2015-01-17 ENCOUNTER — Telehealth: Payer: Self-pay | Admitting: Family Medicine

## 2015-01-17 VITALS — BP 146/90 | HR 90 | Temp 98.2°F | Resp 16 | Ht 65.0 in | Wt 251.3 lb

## 2015-01-17 DIAGNOSIS — R030 Elevated blood-pressure reading, without diagnosis of hypertension: Secondary | ICD-10-CM

## 2015-01-17 DIAGNOSIS — M546 Pain in thoracic spine: Secondary | ICD-10-CM | POA: Diagnosis not present

## 2015-01-17 DIAGNOSIS — R079 Chest pain, unspecified: Secondary | ICD-10-CM

## 2015-01-17 DIAGNOSIS — IMO0001 Reserved for inherently not codable concepts without codable children: Secondary | ICD-10-CM

## 2015-01-17 DIAGNOSIS — R0789 Other chest pain: Secondary | ICD-10-CM | POA: Diagnosis not present

## 2015-01-17 MED ORDER — METAXALONE 800 MG PO TABS: 800.0000 mg | ORAL_TABLET | Freq: Three times a day (TID) | ORAL | Status: DC

## 2015-01-17 NOTE — Telephone Encounter (Signed)
Pt went to pharmacy and her prescription was not there. Please send to [facility]

## 2015-01-17 NOTE — Telephone Encounter (Signed)
Pt notified called into [facility]

## 2015-01-17 NOTE — Progress Notes (Signed)
Name: Chelsea Werner   MRN: [id number]    DOB: 07-May-1956   Date:01/17/2015       Progress Note  Subjective  Chief Complaint  Chief Complaint  Patient presents with  . Breast Pain    Left side of chest underneath breast- Onset 3 days, getting worst, sharp pain, discomfort, radiating down to lower stomach, and very fatigue    HPI  Chest pain: patient states she has noticed left sided chest pain, initially she thought it was on her breast, but moved to deeper in her chest and below left breast, describes pain as sharp, intermittent, with touch, no dyspnea, no nausea, vomiting, diaphoresis, rash, no trauma or change in physical activity. No previous history of heart disease. She has a history of PE, but she has felt tired.   Patient Active Problem List   Diagnosis Date Noted  . Fibrocystic breast disease 01/03/2015  . Cervical radiculitis 01/03/2015  . Extreme obesity 01/03/2015  . Migraine with aura and without status migrainosus, not intractable 01/03/2015  . LA (lupus anticoagulant) disorder 01/03/2015  . 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 Benign hypertension 01/03/2015  . Vitamin D deficiency 01/03/2015  . History of TB (tuberculosis) 01/03/2015  . Hives 01/03/2015  . History of pulmonary embolus (PE) 12/16/2014  . Thrombocytopenia 07/01/2014  . Edema   . Sacral back pain 04/10/2012  . Chronic low back pain   . Iritis, chronic   . Allergic rhinitis   . Personal history of tuberculosis 10/26/2007    Past Surgical History  Procedure Laterality Date  . Breast biopsy  1988, 2008  . Abdominal hysterectomy  1999-2000  . Back surgery  1984  . Myomectomy  1988  . Plantar fasciectomy  2013  . Cesarean section      Family History  Problem Relation Age of Onset  . Early death Mother   . Hypertension Father   . Thyroid cancer Sister   . Deep vein thrombosis Sister   . Parkinson's disease Brother   . Prostate cancer Brother     History   Social History  .  Marital Status: Widowed    Spouse Name: N/A  . Number of Children: 1  . Years of Education: N/A   Occupational History  . 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 Smoking status: Never Smoker   . Smokeless tobacco: Never Used  . Alcohol Use: 0.0 oz/week    0 Standard drinks or equivalent per week     Comment: occasional  . Drug Use: No  . Sexual Activity: No   Other Topics Concern  . Not on file   Social History Narrative   Regular exercise-no   Caffeine Use-yes     Current outpatient prescriptions:  .  clobetasol cream (TEMOVATE) 0.05 %, Apply 1 application topically as needed., Disp: , Rfl:  .  furosemide (LASIX) 20 MG tablet, Take 2 tablets (40 mg total) by mouth daily as needed for fluid or edema., Disp: 90 tablet, Rfl: 1 .  hydrocortisone valerate cream (WESTCORT) 0.2 %, Apply topically., Disp: , Rfl:  .  hydrOXYzine (ATARAX/VISTARIL) 25 MG tablet, Take 1 tablet (25 mg total) by mouth 3 (three) times daily as needed., Disp: 60 tablet, Rfl: 0 .  Hypromellose (GENTEAL) 0.3 % SOLN, Place 1 drop into both eyes daily., Disp: , Rfl:  .  lidocaine (LIDODERM) 5 %, Place 2 patches onto the skin daily., Disp: 60 patch, Rfl:  5 .  loratadine (CLARITIN) 10 MG tablet, Take 1 tablet by mouth daily., Disp: , Rfl:  .  losartan-hydrochlorothiazide (HYZAAR) 50-12.5 MG per tablet, Take 1 tablet by mouth daily., Disp: 30 tablet, Rfl: 5 .  Multiple Vitamins-Minerals (WOMENS MULTIVITAMIN PLUS) TABS, Take 1 tablet by mouth daily., Disp: , Rfl:  .  potassium chloride (KLOR-CON) 20 MEQ packet, Take 20 mEq by mouth daily., Disp: 30 tablet, Rfl: 5 .  prednisoLONE acetate (PRED FORTE) 1 % ophthalmic suspension, Place 1 drop into both eyes every other day., Disp: 5 mL, Rfl: 1 .  Probiotic Product (PROBIOTIC FORMULA PO), Take 1 capsule by mouth daily., Disp: , Rfl:  .  ALLERGY RELIEF/NASAL DECONGEST 10-240 MG per 24 hr tablet, TAKE 1 TABLET BY MOUTH DAILY. (Patient  not taking: Reported on 01/17/2015), Disp: 30 tablet, Rfl: 3 .  metaxalone (SKELAXIN) 800 MG tablet, Take 1 tablet (800 mg total) by mouth 3 (three) times daily., Disp: 30 tablet, Rfl: 0 .  omega-3 acid ethyl esters (LOVAZA) 1 G capsule, Take by mouth 2 (two) times daily., Disp: , Rfl:   Allergies  Allergen Reactions  . Ace Inhibitors Cough  . Nsaids Hives  . Sulfa Antibiotics Hives     ROS  Constitutional: Negative for fever or weight change. Feeling very tired Respiratory: occasional  cough since PE and shortness of breath.   Cardiovascular: Positive for chest pain but nopalpitations.  Gastrointestinal: Negative for abdominal pain, no bowel changes.  Musculoskeletal: Negative for gait problem or joint swelling.  Skin: Negative for rash. Back pain chronic but now has thoracic pain also Neurological: Negative for dizziness or headache.  No other specific complaints in a complete review of systems (except as listed in HPI above).  Objective  Filed Vitals:   01/17/15 1442  BP: 146/90  Pulse: 90  Temp: 98.2 F (36.8 C)  TempSrc: Oral  Resp: 16  Height: 5\' 5"  (1.651 m)  Weight: 251 lb 4.8 oz (113.989 kg)  SpO2: 97%    Body mass index is 41.82 kg/(m^2).  Physical Exam   Constitutional: Patient appears well-developed and well-nourished. Obese Yes No distress.  Eyes:  No scleral icterus.  Neck: Normal range of motion. Neck supple. Cardiovascular: Normal rate, regular rhythm and normal heart sounds.  No murmur heard. No BLE edema. She is always sore during palpation of calves since previous PE, but no changes Pulmonary/Chest: Effort normal and breath sounds normal. No respiratory distress. Pain during palpation of left side of chest, worse below left breast, also radiates to flank area and has mid thoracic back pain. Abdominal: Soft.  There is no tenderness. Psychiatric: Patient has a normal mood and affect. behavior is normal. Judgment and thought content  normal.  PHQ2/9: Depression screen PHQ 2/9 01/03/2015  Decreased Interest 0  Down, Depressed, Hopeless 0  PHQ - 2 Score 0    Fall Risk: Fall Risk  01/03/2015  Falls in the past year? Yes  Number falls in past yr: 1  Injury with Fall? Yes     Assessment & Plan  1. Chest pain at rest  Showed non specific ST changes. She has a history of PE, pain seems muscular, we will try skelaxin, she is allergic to NSAID's and does not want to try steroids because of side effects. [facility] after one or two doses of medication. Also advised to try topical medication  - EKG 12-Lead  2. Chest wall pain  - metaxalone (SKELAXIN) 800  MG tablet; Take 1 tablet (800 mg total) by mouth 3 (three) times daily.  Dispense: 30 tablet; Refill: 0  3. Left-sided thoracic back pain It may be the cause of her pain  4. Elevated blood pressure Likely secondary to pain, no tachycardia or hypoxemia

## 2015-01-22 ENCOUNTER — Encounter

## 2015-02-01 LAB — COMPREHENSIVE METABOLIC PANEL
ALBUMIN: 4.2 g/dL (ref 3.5–5.5)
ALT: 12 IU/L (ref 0–32)
AST: 16 IU/L (ref 0–40)
Albumin/Globulin Ratio: 1.6 (ref 1.1–2.5)
Alkaline Phosphatase: 65 IU/L (ref 39–117)
BUN / CREAT RATIO: 16 (ref 9–23)
BUN: 16 mg/dL (ref 6–24)
Bilirubin Total: 0.6 mg/dL (ref 0.0–1.2)
CHLORIDE: 97 mmol/L (ref 97–108)
CO2: 25 mmol/L (ref 18–29)
Calcium: 9.1 mg/dL (ref 8.7–10.2)
Creatinine, Ser: 0.98 mg/dL (ref 0.57–1.00)
GFR calc Af Amer: 74 mL/min/{1.73_m2} (ref >59)
GFR calc non Af Amer: 64 mL/min/{1.73_m2} (ref >59)
Globulin, Total: 2.6 g/dL (ref 1.5–4.5)
Glucose: 86 mg/dL (ref 65–99)
Potassium: 4.1 mmol/L (ref 3.5–5.2)
Sodium: 138 mmol/L (ref 134–144)
TOTAL PROTEIN: 6.8 g/dL (ref 6.0–8.5)

## 2015-02-01 LAB — LIPID PANEL
CHOLESTEROL TOTAL: 251 mg/dL — AB (ref 100–199)
Chol/HDL Ratio: 2.5 ratio units (ref 0.0–4.4)
HDL: 102 mg/dL (ref >39)
LDL CALC: 137 mg/dL — AB (ref 0–99)
TRIGLYCERIDES: 62 mg/dL (ref 0–149)
VLDL Cholesterol Cal: 12 mg/dL (ref 5–40)

## 2015-02-01 LAB — CBC
Hematocrit: 40.9 % (ref 34.0–46.6)
Hemoglobin: 13.7 g/dL (ref 11.1–15.9)
MCH: 26.6 pg (ref 26.6–33.0)
MCHC: 33.5 g/dL (ref 31.5–35.7)
MCV: 79 fL (ref 79–97)
Platelets: 268 10*3/uL (ref 150–379)
RBC: 5.15 x10E6/uL (ref 3.77–5.28)
RDW: 14.5 % (ref 12.3–15.4)
WBC: 6.3 10*3/uL (ref 3.4–10.8)

## 2015-02-01 LAB — VITAMIN D 25 HYDROXY (VIT D DEFICIENCY, FRACTURES): Vit D, 25-Hydroxy: 46.2 ng/mL (ref 30.0–100.0)

## 2015-02-01 LAB — TSH: TSH: 1.9 u[IU]/mL (ref 0.450–4.500)

## 2015-02-03 NOTE — Progress Notes (Signed)
Patient notified

## 2015-02-17 ENCOUNTER — Telehealth: Payer: Self-pay

## 2015-02-20 ENCOUNTER — Encounter: Payer: PRIVATE HEALTH INSURANCE | Attending: Adult Health

## 2015-02-24 ENCOUNTER — Encounter: Payer: Self-pay | Admitting: Family Medicine

## 2015-02-28 ENCOUNTER — Other Ambulatory Visit: Payer: Self-pay | Admitting: Emergency Medicine

## 2015-02-28 DIAGNOSIS — R928 Other abnormal and inconclusive findings on diagnostic imaging of breast: Secondary | ICD-10-CM

## 2015-03-03 NOTE — Telephone Encounter (Signed)
Updated Pharmacy

## 2015-03-04 ENCOUNTER — Ambulatory Visit: Admit: 2015-03-04 | Payer: PRIVATE HEALTH INSURANCE | Attending: Adult Health

## 2015-03-04 DIAGNOSIS — G10 Huntington's disease: Secondary | ICD-10-CM

## 2015-03-04 NOTE — Unmapped (Signed)
1.  Start taking Calms Forte at 9:00 PM, 2 tablets.

## 2015-03-04 NOTE — Unmapped (Signed)
Subjective:      Patient ID: Brenda [name] is a 59 y.o. female.    HPI   Movement Disorders HPI    The patient presents for follow-up. She is no longer having muscle soreness. She takes Advil prn. She is having more perseveration and becoming more stubborn per her husband. She is having problems with sleep initiation; sometimes she takes 3-4 clonazepam to get to sleep.  Her condition has progressed overall.?? She continues to have frequent falls.  She denies any depression but later says maybe she is slightly depressed.?? She continues to see Dr. Corinne Ports and is on risperidone and lithium. She stopped taking Adderrall but now wants to take it again. She is hyperfocused about sleep.  She says she wants to get up earlier but then she says she is tired all of the time.  She likes to go out every day and she gets bored at home.??     Her initial history follows: she first noticed racy, speedy spells in December 2010. These spells consist of her mind racing, she starts speaking, her muscles get tight. Initially she thought these spells were due to taking steroids (for asthma). Shortly afterwards she noticed generalized involuntary movements. She describes them as twitches. She started dropping objects, since mid 2011. She denies difficulty walking, but she admits that her balance can be off at times. In November 2011 she was rocking in a chair and she fell backwards with the whole chair. In 2010 she was trying to step over a dog gate and she fell and hit her face; she suffered a left orbital fracture. She has noticed that she is bumping into things while walking. She still walks and plays golf. She denies hallucinations, delusions, lightheadedness, fainting spells, daytime sedation, nausea or vomiting. She denies memory problems. She admits occasional feelings of depression, especially in the winter. She has tried sertraline in the past as well as escitalopram but stopped these previously due to side effects.  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 She discontinued sertraline in the past. She did not feel the need to start tetrabenazine, partially because it would be difficult to get. ??         Histories:     She has a past medical history of Asthma; Environmental allergies; Superficial injury of face without infection (04/12/2009); Hematoma, left eyebrow (04/18/2009); Huntington's chorea (08/11/2010); Esophageal reflux (08/10/2010); Osteopenia (08/10/2010); Fracture of left orbit (2010); Anxiety; Fatigue (05/24/2011); and Chronic sinusitis (05/24/2011).    She has past surgical history that includes Sinus surgery and Tonsillectomy.    Her family history includes Alzheimer's disease in her father; Anxiety disorder in her sister; Depression in her sister; Huntington's disease in her maternal uncle; Huntington's disease (age of onset: 67) in her mother; Pulmonary embolism in her mother; Suicidality in her sister.    She reports that she has never smoked. She has never used smokeless tobacco. She reports that she drinks about 0.6 oz of alcohol per week. She reports that she does not use illicit drugs.      Review of Systems  Refer to HPI for review of systems documentation.      Allergies:   Zithromax; Bactrim; Erythromycin; Erythromycin base; Methylphenidate; and Sulfa (sulfonamide antibiotics)    Medications:     Outpatient Encounter Prescriptions as of 03/04/2015   Medication Sig Dispense Refill   ??? cholecalciferol, vitamin D3, 1000 units tablet Take 1,000 Units by mouth daily.     ???  clonazePAM (KLONOPIN) 1 MG tablet 2 tablets (2 mg total) at bedtime.     ??? lithium (ESKALITH) 450 MG CR tablet Take 450 mg by mouth At bedtime.     ??? mirabegron (MYBETRIQ) 50 mg Tb24 Take 50 mg by mouth daily. Indications: Bladder Hyperactivity     ??? multivitamin (THERAGRAN) tablet Take 1 tablet by mouth daily.       ??? omeprazole (PRILOSEC) 40 MG capsule Take 40 mg by mouth every morning before breakfast.  Indications: Gastroesophageal Reflux     ??? risperiDONE (RISPERDAL) 1 MG tablet Take 2.5 mg by mouth At bedtime.        ??? traZODone (DESYREL) 100 MG tablet Take 1 tablet (100 mg total) by mouth at bedtime. Indications: SLEEP 90 tablet 3   ??? UNABLE TO FIND Calms forte  Take prn     ??? dextroamphetamine-amphetamine (AMPHETAMINE-DEXTROAMPHETAMINE) 5 mg Tab Take 5 mg by mouth every morning.     ??? [DISCONTINUED] calcium carbonate-vitamin D3 600 mg(1,500mg ) -200 unit Tab Take 1 tablet by mouth daily.         No facility-administered encounter medications on file as of 03/04/2015.        Objective:       Blood pressure 123/63, pulse 96, height 5' 4 (1.626 m), weight 132 lb (59.875 kg), last menstrual period 05/20/2011.    Neurologic Exam  UHDRS 1: Motor Examination  Huntington's Disease Rating Scale  Ocular pursuit, horizontal: 1  Ocular pursuit, vertical: 1  Saccade initiation, horizontal: 2  Saccade initiation, vertical: 2  Saccade velocity, hortizontal : 2  Saccade velocity, vertical: 2  Dysarthria : 1  Tongue Protrusion: 1  Fingers taps, right: 2  Fingers taps, left : 2  Pronate/supinate hands, right : 2  Pronate/supinate hands, left : 2  Luria (fist-hand-palm test): 1  Rigidity arms, right : 1  Rigidity arms, left: 2  Body Bradykinesia: 2  Maximal dystonia, trunk: 0  Maximal dystonia, RUE: 0  Maximal dystonia, LUE: 1  Maximal dystonia, RLE: 1  Maximal dystonia, LLE: 1  Maximal chorea, face: 0  Maixmal chorea, BOL: 0  Maximal chorea, trunk: 0   Maximal chorea, RUE: 0  Maximal chorea, LUE: 0  Maximal chorea, RLE: 0  Maximal chorea, LLE: 0  Gait: 2  Tandem Walking: 3  Retropulsion Pull Test: 2  Diagnosis confidence level: 4  UHDRS 1 TOTAL:: 36    Physical Exam     Assessment:     ??  1.?? Huntington's chorea ??   2.      Sleep disorder, sleep initiation  3.      Paranoia  4.      Anxiety  5.      Depression    She has family history of HD, and her history and examination are consistent with a clinical diagnosis of  Huntington's disease, progressively worse over time. 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 She is taking risperidone, which has helped with psychiatric symptoms. Obsessing and perseveration continues. Her balance is worse over time as well as cognition. She may benefit from OTC sleep aid like Calms Forte to help with sleep initiation.               Plan:     1. Continue risperidone 2.5?? mg qhs by Dr. Corinne Ports. ??  2. Continue clonazepam however recommended she take only 2 tablets at bedtime to prevent excessive sleepiness. ??  3.  Continue trazodone 100 mg qhs for sleep. ??  4. Continue to follow up with Psychiatry. ??  5. Continue exercise.   6. Follow-up in 4 months with Dr. Gerilyn Pilgrim       Time spent with patient:   Established:    25 min  More than 50% of this time was spent discussing:   * Diagnosis  * Management  * Treatment Plan

## 2015-03-07 ENCOUNTER — Ambulatory Visit (INDEPENDENT_AMBULATORY_CARE_PROVIDER_SITE_OTHER): Payer: Managed Care, Other (non HMO) | Admitting: Family Medicine

## 2015-03-07 ENCOUNTER — Encounter: Payer: Self-pay | Admitting: Family Medicine

## 2015-03-07 VITALS — BP 106/68 | HR 86 | Temp 98.3°F | Resp 18 | Ht 65.0 in | Wt 249.3 lb

## 2015-03-07 DIAGNOSIS — Z0000 Encounter for general adult medical examination without abnormal findings: Secondary | ICD-10-CM | POA: Diagnosis not present

## 2015-03-07 DIAGNOSIS — M19011 Primary osteoarthritis, right shoulder: Secondary | ICD-10-CM

## 2015-03-07 DIAGNOSIS — M25511 Pain in right shoulder: Secondary | ICD-10-CM

## 2015-03-07 DIAGNOSIS — G8929 Other chronic pain: Secondary | ICD-10-CM | POA: Diagnosis not present

## 2015-03-07 DIAGNOSIS — Z7189 Other specified counseling: Secondary | ICD-10-CM | POA: Diagnosis not present

## 2015-03-07 DIAGNOSIS — Z01419 Encounter for gynecological examination (general) (routine) without abnormal findings: Secondary | ICD-10-CM

## 2015-03-07 DIAGNOSIS — Z719 Counseling, unspecified: Secondary | ICD-10-CM

## 2015-03-07 DIAGNOSIS — M19019 Primary osteoarthritis, unspecified shoulder: Secondary | ICD-10-CM | POA: Insufficient documentation

## 2015-03-07 MED ORDER — HYDROCODONE-ACETAMINOPHEN 10-325 MG PO TABS: 1.0000 | ORAL_TABLET | Freq: Every day | ORAL | Status: DC

## 2015-03-07 NOTE — Progress Notes (Signed)
Name: Chelsea Werner   MRN: [id number]    DOB: August 13, 1955   Date:03/07/2015       Progress Note  Subjective  Chief Complaint  Chief Complaint  Patient presents with  . Annual Exam    HPI  Well Woman Exam: she is status post hysterectomy for benign causes, no longer needs a pap smear. Mammogram is up to date, colonoscopy is due in 2018. Labs up to date. No urinary problems  Chronic Right shoulder pain: seen at [facility] since Fall on 2015.  She had an MRI that showed small bursitis, no rotator cuff tear and OA or acromio-clavicular joint. She is having pain daily. Lowest level 6/10 but much after working all day. She having  PT and had trigger point injections. It has been affecting her ability to sleep and also ability to engage in her hobbies such as gardening and also affecting her ADL - unable to vacuum the house.   Patient Active Problem List   Diagnosis Date Noted  . 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 Fibrocystic breast disease 01/03/2015  . Cervical radiculitis 01/03/2015  . Extreme obesity 01/03/2015  . Migraine with aura and without status migrainosus, not intractable 01/03/2015  . LA (lupus anticoagulant) disorder 01/03/2015  . 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 Benign hypertension 01/03/2015  . Vitamin D deficiency 01/03/2015  . History of TB (tuberculosis) 01/03/2015  . Hives 01/03/2015  . History of pulmonary embolus (PE) 12/16/2014  . Thrombocytopenia 07/01/2014  . Chronic right shoulder pain 04/04/2014  . Edema   . Sacral back pain 04/10/2012  . Chronic low back pain   . Iritis, chronic   . Allergic rhinitis   . Personal history of tuberculosis 10/26/2007    Past Surgical History  Procedure Laterality Date  . Breast biopsy  1988, 2008  . Abdominal hysterectomy  1999-2000  . Back surgery  1984  . Myomectomy  1988  . Plantar fasciectomy  2013  . Cesarean section      Family History  Problem Relation Age of Onset  . Early death Mother    . Hypertension Father   . Thyroid cancer Sister   . Deep vein thrombosis Sister   . Parkinson's disease Brother   . Prostate cancer Brother     Social History   Social History  . Marital Status: Widowed    Spouse Name: N/A  . Number of Children: 1  . Years of Education: N/A   Occupational History  . 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 Smoking status: Never Smoker   . Smokeless tobacco: Never Used  . Alcohol Use: 0.0 oz/week    0 Standard drinks or equivalent per week     Comment: occasional  . Drug Use: No  . Sexual Activity: No   Other Topics Concern  . Not on file   Social History Narrative   Regular exercise-no   Caffeine Use-yes     Current outpatient prescriptions:  .  clobetasol cream (TEMOVATE) 0.05 %, Apply 1 application topically as needed., Disp: , Rfl:  .  furosemide (LASIX) 20 MG tablet, Take 2 tablets (40 mg total) by mouth daily as needed for fluid or edema., Disp: 90 tablet, Rfl: 1 .  hydrocortisone valerate cream (WESTCORT) 0.2 %, Apply topically., Disp: , Rfl:  .  hydrOXYzine (ATARAX/VISTARIL) 25 MG tablet, Take 1 tablet (25 mg total) by mouth 3 (three) times daily as needed., Disp: 60 tablet,  Rfl: 0 .  Hypromellose (GENTEAL) 0.3 % SOLN, Place 1 drop into both eyes daily., Disp: , Rfl:  .  lidocaine (LIDODERM) 5 %, Place 2 patches onto the skin daily., Disp: 60 patch, Rfl: 5 .  loratadine (CLARITIN) 10 MG tablet, Take 1 tablet by mouth daily., Disp: , Rfl:  .  losartan-hydrochlorothiazide (HYZAAR) 50-12.5 MG per tablet, Take 1 tablet by mouth daily., Disp: 30 tablet, Rfl: 5 .  metaxalone (SKELAXIN) 800 MG tablet, Take 1 tablet (800 mg total) by mouth 3 (three) times daily., Disp: 30 tablet, Rfl: 0 .  Multiple Vitamins-Minerals (WOMENS MULTIVITAMIN PLUS) TABS, Take 1 tablet by mouth daily., Disp: , Rfl:  .  omega-3 acid ethyl esters (LOVAZA) 1 G capsule, Take by mouth 2 (two) times daily., Disp: , Rfl:  .   potassium chloride (KLOR-CON) 20 MEQ packet, Take 20 mEq by mouth daily., Disp: 30 tablet, Rfl: 5 .  prednisoLONE acetate (PRED FORTE) 1 % ophthalmic suspension, Place 1 drop into both eyes every other day., Disp: 5 mL, Rfl: 1 .  Probiotic Product (PROBIOTIC FORMULA PO), Take 1 capsule by mouth daily., Disp: , Rfl:  .  HYDROcodone-acetaminophen (NORCO) 10-325 MG per tablet, Take 1 tablet by mouth at bedtime., Disp: 30 tablet, Rfl: 0  Allergies  Allergen Reactions  . Ace Inhibitors Cough  . Nsaids Hives  . Sulfa Antibiotics Hives     ROS  Constitutional: Negative for fever or weight change.  Respiratory: Negative for cough and shortness of breath.   Cardiovascular: Negative for chest pain or palpitations.  Gastrointestinal: Negative for abdominal pain, no bowel changes.  Musculoskeletal: Negative for gait problem or joint swelling.  Skin: Negative for rash.  Neurological: Negative for dizziness or headache.  No other specific complaints in a complete review of systems (except as listed in HPI above).  Objective  Filed Vitals:   03/07/15 1442  BP: 106/68  Pulse: 86  Temp: 98.3 F (36.8 C)  TempSrc: Oral  Resp: 18  Height: 5\' 5"  (1.651 m)  Weight: 249 lb 4.8 oz (113.082 kg)  SpO2: 98%    Body mass index is 41.49 kg/(m^2).  Physical Exam  Constitutional: Patient appears well-developed and well-nourished. Obese No distress.  HENT: Head: Normocephalic and atraumatic. Ears: B TMs ok, no erythema or effusion; Nose: Nose normal. Mouth/Throat: Oropharynx is clear and moist. No oropharyngeal exudate.  Eyes: Conjunctivae and EOM are normal. Pupils are equal, round, and reactive to light. No scleral icterus.  Neck: Normal range of motion. Neck supple. No JVD present. No thyromegaly present.  Cardiovascular: Normal rate, regular rhythm and normal heart sounds.  No murmur heard. No BLE edema. Pulmonary/Chest: Effort normal and breath sounds normal. No respiratory  distress. Abdominal: Soft. Bowel sounds are normal, no distension. There is no tenderness. no masses Breast: no lumps or masses, no nipple discharge or rashes FEMALE GENITALIA:  External genitalia normal External urethra normal RECTAL: not done Musculoskeletal: decrease  range of motion right shoulder , no joint effusions. No gross deformities. Low back pain during palpation  Neurological: he is alert and oriented to person, place, and time. No cranial nerve deficit. Coordination, balance, strength, speech and gait are normal.  Skin: Skin is warm and dry. No rash noted. No erythema.  Psychiatric: Patient has a normal mood and affect. behavior is normal. Judgment and thought content normal.  Recent Results (from the past 2160 hour(s))  TSH     Status: None   Collection Time: 01/31/15 11:31 AM  Result Value Ref Range   TSH 1.900 0.450 - 4.500 uIU/mL  Vitamin D (25 hydroxy)     Status: None   Collection Time: 01/31/15 11:31 AM  Result Value Ref Range   Vit D, 25-Hydroxy 46.2 30.0 - 100.0 ng/mL    Comment: Vitamin D deficiency has been defined by the Institute of Medicine and an Endocrine Society practice guideline as a level of serum 25-OH vitamin D less than 20 ng/mL (1,2). The Endocrine Society went on to further define vitamin D insufficiency as a level between 21 and 29 ng/mL (2). 1. IOM (Institute of Medicine). 2010. Dietary reference    intakes for calcium and D. [place]: The    [employer]. 2. Holick MF, Binkley [place], Bischoff-Ferrari HA, et al.    Evaluation, treatment, and prevention of vitamin D    deficiency: an Endocrine Society clinical practice    guideline. JCEM. 2011 Jul; 96(7):1911-30.   CBC     Status: None   Collection Time: 01/31/15 11:31 AM  Result Value Ref Range   WBC 6.3 3.4 - 10.8 x10E3/uL   RBC 5.15 3.77 - 5.28 x10E6/uL   Hemoglobin 13.7 11.1 - 15.9 g/dL   Hematocrit 40.9 81.1 - 46.6 %   MCV 79 79 - 97 fL   MCH 26.6 26.6 - 33.0 pg    MCHC 33.5 31.5 - 35.7 g/dL   RDW 91.4 78.2 - 95.6 %   Platelets 268 150 - 379 x10E3/uL  Lipid Profile     Status: Abnormal   Collection Time: 01/31/15 11:31 AM  Result Value Ref Range   Cholesterol, Total 251 (H) 100 - 199 mg/dL   Triglycerides 62 0 - 149 mg/dL   HDL 213 >08 mg/dL    Comment: According to ATP-III Guidelines, HDL-C >59 mg/dL is considered a negative risk factor for CHD.    VLDL Cholesterol Cal 12 5 - 40 mg/dL   LDL Calculated 657 (H) 0 - 99 mg/dL   Chol/HDL Ratio 2.5 0.0 - 4.4 ratio units    Comment:                                   T. Chol/HDL Ratio                                             Men  Women                               1/2 Avg.Risk  3.4    3.3                                   Avg.Risk  5.0    4.4                                2X Avg.Risk  9.6    7.1                                3X Avg.Risk 23.4   11.0   Comprehensive Metabolic Panel (CMET)  Status: None   Collection Time: 01/31/15 11:31 AM  Result Value Ref Range   Glucose 86 65 - 99 mg/dL   BUN 16 6 - 24 mg/dL   Creatinine, Ser 1.61 0.57 - 1.00 mg/dL   GFR calc non Af Amer 64 >59 mL/min/1.73   GFR calc Af Amer 74 >59 mL/min/1.73   BUN/Creatinine Ratio 16 9 - 23   Sodium 138 134 - 144 mmol/L   Potassium 4.1 3.5 - 5.2 mmol/L   Chloride 97 97 - 108 mmol/L   CO2 25 18 - 29 mmol/L   Calcium 9.1 8.7 - 10.2 mg/dL   Total Protein 6.8 6.0 - 8.5 g/dL   Albumin 4.2 3.5 - 5.5 g/dL   Globulin, Total 2.6 1.5 - 4.5 g/dL   Albumin/Globulin Ratio 1.6 1.1 - 2.5   Bilirubin Total 0.6 0.0 - 1.2 mg/dL   Alkaline Phosphatase 65 39 - 117 IU/L   AST 16 0 - 40 IU/L   ALT 12 0 - 32 IU/L      PHQ2/9: Depression screen [facility] 2/9 03/07/2015 01/03/2015  Decreased Interest 0 0  Down, Depressed, Hopeless 0 0  PHQ - 2 Score 0 0     Fall Risk: Fall Risk  03/07/2015 01/03/2015  Falls in the past year? Yes Yes  Number falls in past yr: 1 1  Injury with Fall? Yes Yes      Assessment & Plan  1. Well woman  exam  Based on the results of lipid panel his cardiovascular risk factor in the next 10 years is :1.9%  2. Chronic right shoulder pain  - HYDROcodone-acetaminophen (NORCO) 10-325 MG per tablet; Take 1 tablet by mouth at bedtime.  Dispense: 30 tablet; Refill: 0  3. Acromioclavicular arthrosis, right  Continue PT, explained chronic nature of disease, she can't take nsaid's, continue lidoderm patch. Cat take Tylenol twice daily am and lunch if needed.   4. Health counseling  Discussed importance of 150 minutes of physical activity weekly, eat two servings of fish weekly, eat one serving of tree nuts ( cashews, pistachios, pecans, almonds.Marland Kitchen) every other day, eat 6 servings of fruit/vegetables daily and drink plenty of water and avoid sweet beverages.

## 2015-03-11 ENCOUNTER — Encounter: Payer: Self-pay | Admitting: Family Medicine

## 2015-04-16 ENCOUNTER — Ambulatory Visit: Admit: 2015-04-16 | Discharge: 2015-04-16 | Payer: BLUE CROSS/BLUE SHIELD | Attending: Internal Medicine

## 2015-04-16 DIAGNOSIS — Z0000 Encounter for general adult medical examination without abnormal findings: Secondary | ICD-10-CM

## 2015-04-16 NOTE — Progress Notes (Signed)
Subjective:      Patient ID: Danielle Lawrence is a 59 y.o. female.    HPI    Here for a well exam.  Sister present for visit and history obtained from her due to patient's neurologic progression of her Huntingtons. Being followed by Neurology for her Huntingtons disease.  Reports that her Huntington's is worsening. Past Medical History, Medications, Social History, Family History, Health Maintenance and Labs have been reviewed with Danielle Lawrence.      Review of Systems   Constitutional: Negative for unexpected weight change.   Respiratory: Negative for cough, chest tightness, shortness of breath and wheezing.    Cardiovascular: Negative for chest pain, palpitations and leg swelling.   Gastrointestinal: Negative for abdominal distention, constipation, diarrhea, nausea and vomiting.   Musculoskeletal: Negative for arthralgias, back pain and myalgias.   Neurological: Positive for dizziness, tremors, speech difficulty and weakness. Negative for numbness.   Psychiatric/Behavioral: Positive for confusion, decreased concentration and dysphoric mood. Negative for self-injury and suicidal ideas.   All other systems reviewed and are negative.      Objective:   Physical Exam   Constitutional: She is oriented to person, place, and time. She appears well-developed and well-nourished. No distress.   HENT:   Right Ear: External ear normal.   Left Ear: External ear normal.   Mouth/Throat: Oropharynx is clear and moist. No oropharyngeal exudate.   Neck: Neck supple. No thyromegaly present.   Cardiovascular: Normal rate, regular rhythm and normal heart sounds.    Pulmonary/Chest: Effort normal and breath sounds normal. No respiratory distress. She has no wheezes. She has no rales.   Abdominal: Soft. She exhibits no distension. There is no tenderness. There is no rebound and no guarding.   Musculoskeletal: She exhibits no edema.   Neurological: She is alert and oriented to person, place, and time. Coordination abnormal.   Slow thought  procession with somewhat tangential speech.  Occasional hand and facial movements   Skin: She is not diaphoretic.       Assessment:      Danielle Lawrence was seen today for annual exam.    Diagnoses and all orders for this visit:    Well adult exam  Encouraged exercise and healthy diet.  F/u with ob/gyn and dentist regularly.  Recommend eye exam.  Wears seat belt.  Provided rx for fasting labs.  Continue medications.           Flu vaccine need  -     INFLUENZA, QUADRIVALENT, 3 YRS AND OLDER, IM (FLULAVAL QUADRIVALENT)          Plan:        See above plan

## 2015-04-17 NOTE — Telephone Encounter (Signed)
error 

## 2015-04-24 NOTE — Telephone Encounter (Signed)
error 

## 2015-05-28 ENCOUNTER — Encounter: Attending: Nurse Practitioner

## 2015-06-02 NOTE — Telephone Encounter (Signed)
LM for Danielle Lawrence (husband) per HIPPA letting him know she wasn't due for anything according to health maintance

## 2015-06-02 NOTE — Telephone Encounter (Signed)
Apparently, Danielle Lawrence cancelled her appointment for her Pap/Female Exam and failed to advise Danielle Lawrence, spouse.  Danielle Lawrence wants to have someone call him to discuss options with him about [name] care.    Is she due a Female/Pap exam?  What about her mammogram?  Are these things that can be done in 2017?    Please call Danielle Lawrence and give direction about these items at [phone].

## 2015-06-11 ENCOUNTER — Other Ambulatory Visit: Payer: Self-pay | Admitting: Family Medicine

## 2015-06-13 ENCOUNTER — Telehealth: Payer: Self-pay | Admitting: Family Medicine

## 2015-06-13 NOTE — Telephone Encounter (Signed)
[facility] requesting return call: stated that they received a denial on Genteal Gel Drop because you never prescribed it, however the patient is telling them that you have prescribed it before. Please return call [phone]

## 2015-06-13 NOTE — Telephone Encounter (Signed)
Have you ever prescribed? It looks like you have in the old chart before?

## 2015-06-14 NOTE — Telephone Encounter (Signed)
I need to know the dose. Thank you

## 2015-06-16 MED ORDER — HYPROMELLOSE 0.3 % OP SOLN: 1.0000 [drp] | Freq: Two times a day (BID) | OPHTHALMIC | Status: DC

## 2015-06-16 NOTE — Telephone Encounter (Signed)
Ok it's 0.3% 1-2 drops bid

## 2015-06-16 NOTE — Telephone Encounter (Signed)
Done, please make sure it went to the right pharmacy

## 2015-06-16 NOTE — Addendum Note (Signed)
Addended by: Alba CorySOWLES, Farrah Skoda F on: 06/16/2015 10:47 AM   Modules accepted: Orders

## 2015-06-20 ENCOUNTER — Other Ambulatory Visit: Payer: Self-pay

## 2015-06-20 MED ORDER — LORATADINE 10 MG PO TABS
10.0000 mg | ORAL_TABLET | Freq: Every day | ORAL | Status: DC
Start: 2015-06-20 — End: 2018-03-11

## 2015-06-20 MED ORDER — POLYETHYL GLYCOL-PROPYL GLYCOL 0.4-0.3 % OP GEL: 1.0000 "application " | Freq: Two times a day (BID) | OPHTHALMIC | Status: DC

## 2015-07-01 ENCOUNTER — Telehealth

## 2015-07-01 MED ORDER — DOXYCYCLINE HYCLATE 100 MG PO TABS
100 MG | ORAL_TABLET | Freq: Two times a day (BID) | ORAL | 0 refills | Status: AC
Start: 2015-07-01 — End: 2015-07-11

## 2015-07-01 NOTE — Telephone Encounter (Signed)
Patient is 59 years old complaining of cough for 3 weeks.    Neg for sore throat or ear pain.  Pos for fever.  Neg for wheezing.  Patient denies difficulty in breathing.    Patient denies history of COPD or Asthma.    Please advise.

## 2015-07-01 NOTE — Telephone Encounter (Signed)
Ordered. Notify patient

## 2015-07-04 NOTE — Telephone Encounter (Signed)
[phone] (home) [phone] (work)  [phone] answer machine still picks up . Did not leave another message.

## 2015-07-04 NOTE — Telephone Encounter (Signed)
With any allergic reaction, needs appt to be evaluated, [facility] urgent [facility] or we can work her in to my afternoon. Let me know.

## 2015-07-04 NOTE — Telephone Encounter (Signed)
Patient is having allergic reaction  To doxycycline hyclate. Patient took medication last on 07/03/15. She is having hives in mouth. Patient is not having any difficulty breathing, she does have family with her. Patient is requesting medication to counteract. Patient was told stop taking any more of doxycycline medication and if she is having difficulty breathing to go to ED. [facility] [place]

## 2015-07-04 NOTE — Telephone Encounter (Addendum)
[phone] (home) [phone] (work)  [phone] left message for return call.

## 2015-07-10 ENCOUNTER — Ambulatory Visit: Admit: 2015-07-10 | Payer: PRIVATE HEALTH INSURANCE | Attending: Neurology

## 2015-07-10 DIAGNOSIS — G10 Huntington's disease: Secondary | ICD-10-CM

## 2015-07-10 NOTE — Unmapped (Signed)
Subjective:      Patient ID: Brenda [name] is a 60 y.o. female.    HPI   Movement Disorders HPI      The patient presents for follow-up.  She has had falls and refuses to use a cane or walker.  She is having a harder time buttoning her pants so they got elastic pants.  She continues to have friends who take her out.  She is very compulsive in her questioning and her husband says he has to repeat himself several times daily.  She is sleeping better at night.  She has intermittent swallowing difficulties.     Her initial history follows: she first noticed racy, speedy spells in December 2010. These spells consist of her mind racing, she starts speaking, her muscles get tight. Initially she thought these spells were due to taking steroids (for asthma). Shortly afterwards she noticed generalized involuntary movements. She describes them as twitches. She started dropping objects, since mid 2011. She denies difficulty walking, but she admits that her balance can be off at times. In November 2011 she was rocking in a chair and she fell backwards with the whole chair. In 2010 she was trying to step over a dog gate and she fell and hit her face; she suffered a left orbital fracture. She has noticed that she is bumping into things while walking. She still walks and plays golf. She denies hallucinations, delusions, lightheadedness, fainting spells, daytime sedation, nausea or vomiting. She denies memory problems. She admits occasional feelings of depression, especially in the winter. She has tried sertraline in the past as well as escitalopram but stopped these previously due to side effects. 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 She discontinued sertraline in the past. She did not feel the need to start tetrabenazine, partially because it would be difficult to get. ??         Histories:     She has a past medical history of Asthma; Environmental allergies; Superficial injury  of face without infection (04/12/2009); Hematoma, left eyebrow (04/18/2009); Huntington's chorea (08/11/2010); Esophageal reflux (08/10/2010); Osteopenia (08/10/2010); Fracture of left orbit (2010); Anxiety; Fatigue (05/24/2011); and Chronic sinusitis (05/24/2011).    She has past surgical history that includes Sinus surgery and Tonsillectomy.    Her family history includes Alzheimer's disease in her father; Anxiety disorder in her sister; Depression in her sister; Huntington's disease in her maternal uncle; Huntington's disease (age of onset: 39) in her mother; Pulmonary embolism in her mother; Suicidality in her sister.    She reports that she has never smoked. She has never used smokeless tobacco. She reports that she drinks about 0.6 oz of alcohol per week. She reports that she does not use illicit drugs.      Review of Systems: As in HPI    Allergies:   Zithromax; Bactrim; Erythromycin; Erythromycin base; Methylphenidate; and Sulfa (sulfonamide antibiotics)    Medications:     Outpatient Encounter Prescriptions as of 07/10/2015   Medication Sig Dispense Refill   ??? cholecalciferol, vitamin D3, 1000 units tablet Take 1,000 Units by mouth daily.     ??? clonazePAM (KLONOPIN) 1 MG tablet 2 tablets (2 mg total) at bedtime. (Patient taking differently: 3 mg at bedtime.   )     ??? lithium (ESKALITH) 450 MG CR tablet Take 450 mg by mouth At bedtime.     ??? mirabegron (MYBETRIQ) 50 mg Tb24 Take 50 mg by mouth daily. Indications: Bladder  Hyperactivity     ??? multivitamin (THERAGRAN) tablet Take 1 tablet by mouth daily.       ??? omeprazole (PRILOSEC) 40 MG capsule Take 40 mg by mouth every morning before breakfast. Indications: Gastroesophageal Reflux     ??? risperiDONE (RISPERDAL) 1 MG tablet Take 2.5 mg by mouth At bedtime.        ??? traZODone (DESYREL) 100 MG tablet Take 1 tablet (100 mg total) by mouth at bedtime. Indications: SLEEP 90 tablet 3     No facility-administered encounter medications on file as of 07/10/2015.         Objective:       Blood pressure 104/60, pulse 88, height 5' 5 (1.651 m), weight 136 lb (61.689 kg), last menstrual period 05/20/2011.    Neurologic Exam    Physical Exam  UHDRS 1: Motor Examination  Huntington's Disease Rating Scale  Ocular pursuit, horizontal: 1  Ocular pursuit, vertical: 1  Saccade initiation, horizontal: 3  Saccade initiation, vertical: 3  Saccade velocity, hortizontal : 2  Saccade velocity, vertical: 2  Dysarthria : 2  Tongue Protrusion: 1  Fingers taps, right: 2  Fingers taps, left : 2  Pronate/supinate hands, right : 1  Pronate/supinate hands, left : 2  Luria (fist-hand-palm test): 2  Rigidity arms, right : 2  Rigidity arms, left: 2  Body Bradykinesia: 2  Maximal dystonia, trunk: 0  Maximal dystonia, RUE: 0  Maximal dystonia, LUE: 1  Maximal dystonia, RLE: 1  Maximal dystonia, LLE: 1  Maximal chorea, face: 0  Maixmal chorea, BOL: 0  Maximal chorea, trunk: 0   Maximal chorea, RUE: 0  Maximal chorea, LUE: 0  Maximal chorea, RLE: 0  Maximal chorea, LLE: 0  Gait: 2  Tandem Walking: 3  Retropulsion Pull Test: 2  Diagnosis confidence level: 4  UHDRS 1 TOTAL:: 40       Assessment:     1. Huntington's disease        She has family history of HD, and her history and examination are consistent with a clinical diagnosis of Huntington's disease, progressively worse over time. 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 She is taking risperidone, which has helped with psychiatric symptoms. Obsessing and perseveration continues and is getting worse. Her balance is worse over time as well as cognition. Sleep is better. ??    Plan:     1. Told her husband to increase risperidone to 0.5 mg in the morning and continue risperidone 2.5?? mg at bedtime. They have follow-up with Dr. Corinne Ports in a month.   2. Continue clonazepam 3 tablets at bedtime.   3. Continue trazodone 100 mg qhs for sleep. ??  4. Continue to follow up with Psychiatry. ??  5. Spoke with her  husband alone regarding higher levels of care.  He has looked into this as well as having an aid come in to help.   6. Continue exercise. ??  7. Follow-up in 4 months with me.

## 2015-07-11 ENCOUNTER — Ambulatory Visit: Payer: Managed Care, Other (non HMO) | Admitting: Family Medicine

## 2015-07-14 ENCOUNTER — Ambulatory Visit: Payer: Managed Care, Other (non HMO) | Admitting: Family Medicine

## 2015-09-05 ENCOUNTER — Encounter: Payer: Self-pay | Admitting: Family Medicine

## 2015-09-05 ENCOUNTER — Ambulatory Visit (INDEPENDENT_AMBULATORY_CARE_PROVIDER_SITE_OTHER): Payer: Managed Care, Other (non HMO) | Admitting: Family Medicine

## 2015-09-05 VITALS — BP 138/72 | HR 103 | Temp 97.8°F | Resp 14 | Wt 241.9 lb

## 2015-09-05 DIAGNOSIS — G8929 Other chronic pain: Secondary | ICD-10-CM | POA: Diagnosis not present

## 2015-09-05 DIAGNOSIS — M545 Low back pain: Secondary | ICD-10-CM

## 2015-09-05 DIAGNOSIS — L509 Urticaria, unspecified: Secondary | ICD-10-CM | POA: Diagnosis not present

## 2015-09-05 DIAGNOSIS — M5412 Radiculopathy, cervical region: Secondary | ICD-10-CM

## 2015-09-05 DIAGNOSIS — I10 Essential (primary) hypertension: Secondary | ICD-10-CM

## 2015-09-05 DIAGNOSIS — M25511 Pain in right shoulder: Secondary | ICD-10-CM | POA: Diagnosis not present

## 2015-09-05 DIAGNOSIS — H9201 Otalgia, right ear: Secondary | ICD-10-CM

## 2015-09-05 MED ORDER — LOSARTAN POTASSIUM-HCTZ 50-12.5 MG PO TABS: 1.0000 | ORAL_TABLET | Freq: Every day | ORAL | Status: DC

## 2015-09-05 MED ORDER — PREDNISONE 10 MG PO TABS: 10.0000 mg | ORAL_TABLET | Freq: Every day | ORAL | Status: DC

## 2015-09-05 MED ORDER — POTASSIUM CHLORIDE 20 MEQ PO PACK: 20.0000 meq | PACK | Freq: Every day | ORAL | Status: DC

## 2015-09-05 MED ORDER — LIDOCAINE 5 % EX PTCH: 2.0000 | MEDICATED_PATCH | CUTANEOUS | Status: DC

## 2015-09-05 MED ORDER — FLUTICASONE PROPIONATE 50 MCG/ACT NA SUSP: 2.0000 | Freq: Every day | NASAL | Status: DC

## 2015-09-05 MED ORDER — HYDROXYZINE HCL 25 MG PO TABS: 25.0000 mg | ORAL_TABLET | Freq: Three times a day (TID) | ORAL | Status: DC | PRN

## 2015-09-05 MED ORDER — FUROSEMIDE 20 MG PO TABS: 40.0000 mg | ORAL_TABLET | Freq: Every day | ORAL | Status: DC | PRN

## 2015-09-05 NOTE — Progress Notes (Signed)
Name: Chelsea Werner   MRN: [id number]    DOB: [birth year]   Date:09/05/2015       Progress Note  Subjective  Chief Complaint  Chief Complaint  Patient presents with  . Otalgia    onset 1 month off and on bilateral but more right.  Seen at [facility] was given antibiotic and ear drops about 2 weeks ago, and now if back    HPI  Otalgia: she states symptoms of otalgia started about one month ago. Initially on both sides. Went to employee health and was given some ear drops for cerumen impaction, but did resolve symptoms. She went back and was given ear drops, however continues to have pain on right ear ( left side very mild ). She states pain is constant on the right side, dull like, itchy at times. Denies nasal congestion or headaches. Aggravated by applying pressure on the right side ( such as sleeping ).   Chronic Right shoulder pain: seen at [facility] since Fall on 2015. She had an MRI that showed small bursitis, no rotator cuff tear and OA or acromio-clavicular joint, but she went for a second opinion recently and diagnosed with impingement syndrome of right shoulder, and is going to proceed with surgery after daughter's wedding this May.  . She is having pain daily. Lowest level 7/10 but much after sleeping or after working all day. She had PT and had trigger point injections. It has been affecting her ability to sleep and also ability to engage in her hobbies such as gardening and also affecting her ADL - unable to vacuum the house. She is looking forward to feeling better after surgery  HTN: taking bp medication and denies side effects of medication. No chest pain or palpitation.   Chronic low back pain: using Lidoderm patch, sometimes on her shoulder, pain is dull, worse when sitting or standing for a long period of time, or movement. No radiculitis  History of PE: still sees hematologist, off blood thinners now.  Cervical radiculitis: still has pain that goes down right shoulder.    Hives: intermittently taking Atarax prn.    Patient Active Problem List   Diagnosis Date Noted  . 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 Fibrocystic breast disease 01/03/2015  . Cervical radiculitis 01/03/2015  . Extreme obesity (HCC) 01/03/2015  . Migraine with aura and without status migrainosus, not intractable 01/03/2015  . LA (lupus anticoagulant) disorder (HCC) 01/03/2015  . 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 Benign hypertension 01/03/2015  . Vitamin D deficiency 01/03/2015  . History of TB (tuberculosis) 01/03/2015  . Hives 01/03/2015  . History of pulmonary embolus (PE) 12/16/2014  . Thrombocytopenia (HCC) 07/01/2014  . Chronic right shoulder pain 04/04/2014  . Edema   . Sacral back pain 04/10/2012  . Chronic low back pain   . Iritis, chronic   . Allergic rhinitis   . Personal history of tuberculosis 10/26/2007    Past Surgical History  Procedure Laterality Date  . Breast biopsy  1988, 2008  . Abdominal hysterectomy  1999-2000  . Back surgery  1984  . Myomectomy  1988  . Plantar fasciectomy  2013  . Cesarean section      Family History  Problem Relation Age of Onset  . Early death Mother   . Hypertension Father   . Thyroid cancer Sister   . Deep vein thrombosis Sister   . Parkinson's disease Brother   . Prostate cancer Brother     Social History  Social History  . Marital Status: Widowed    Spouse Name: N/A  . Number of Children: 1  . Years of Education: N/A   Occupational History  . 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 Smoking status: Never Smoker   . Smokeless tobacco: Never Used  . Alcohol Use: 0.0 oz/week    0 Standard drinks or equivalent per week     Comment: occasional  . Drug Use: No  . Sexual Activity: No   Other Topics Concern  . Not on file   Social History Narrative   Regular exercise-no   Caffeine Use-yes     Current outpatient prescriptions:  .   clobetasol cream (TEMOVATE) 0.05 %, Apply 1 application topically as needed., Disp: , Rfl:  .  furosemide (LASIX) 20 MG tablet, Take 2 tablets (40 mg total) by mouth daily as needed for fluid or edema., Disp: 90 tablet, Rfl: 1 .  hydrocortisone valerate cream (WESTCORT) 0.2 %, Apply topically., Disp: , Rfl:  .  hydrOXYzine (ATARAX/VISTARIL) 25 MG tablet, Take 1 tablet (25 mg total) by mouth 3 (three) times daily as needed., Disp: 60 tablet, Rfl: 0 .  Hypromellose 0.3 % SOLN, Apply 1 drop to eye 2 (two) times daily., Disp: 1 Bottle, Rfl: 5 .  lidocaine (LIDODERM) 5 %, Place 2 patches onto the skin daily., Disp: 60 patch, Rfl: 5 .  loratadine (CLARITIN) 10 MG tablet, Take 1 tablet (10 mg total) by mouth daily., Disp: 90 tablet, Rfl: 1 .  losartan-hydrochlorothiazide (HYZAAR) 50-12.5 MG per tablet, Take 1 tablet by mouth daily., Disp: 30 tablet, Rfl: 5 .  Multiple Vitamins-Minerals (WOMENS MULTIVITAMIN PLUS) TABS, Take 1 tablet by mouth daily., Disp: , Rfl:  .  omega-3 acid ethyl esters (LOVAZA) 1 G capsule, Take by mouth 2 (two) times daily., Disp: , Rfl:  .  Polyethyl Glycol-Propyl Glycol (SYSTANE) 0.4-0.3 % GEL ophthalmic gel, Place 1 application into both eyes 2 (two) times daily., Disp: 1 Bottle, Rfl: 5 .  potassium chloride (KLOR-CON) 20 MEQ packet, Take 20 mEq by mouth daily., Disp: 30 tablet, Rfl: 5 .  Probiotic Product (PROBIOTIC FORMULA PO), Take 1 capsule by mouth daily., Disp: , Rfl:   Allergies  Allergen Reactions  . Metaxalone Rash  . Ace Inhibitors Cough  . Nsaids Hives  . Sulfa Antibiotics Hives     ROS  Ten systems reviewed and is negative except as mentioned in HPI  Floaters - chronic, right shoulder pain, she still has mild SOB since PE, but better  Objective  Filed Vitals:   09/05/15 1433  BP: 138/72  Pulse: 103  Temp: 97.8 F (36.6 C)  TempSrc: Oral  Resp: 14  Weight: 241 lb 14.4 oz (109.725 kg)  SpO2: 98%    Body mass index is 40.25 kg/(m^2).  Physical  Exam  Constitutional: Patient appears well-developed and well-nourished. Obese No distress.  HEENT: head atraumatic, normocephalic, pupils equal and reactive to light, ears TM normal bilaterally, pain during palpation of right TMJ, no swelling or redness,  neck supple, throat within normal limits Cardiovascular: Normal rate, regular rhythm and normal heart sounds.  No murmur heard. No BLE edema. Pulmonary/Chest: Effort normal and breath sounds normal. No respiratory distress. Abdominal: Soft.  There is no tenderness. Psychiatric: Patient has a normal mood and affect. behavior is normal. Judgment and thought content normal. Muscular Skeletal: positive impingement sign of right shoulder, no pain during palpation of lumbar spine, positive right  straight leg raise   PHQ2/9: [facility] screen [facility] 09/05/2015 03/07/2015 01/03/2015  Decreased Interest 0 0 0  Down, Depressed, Hopeless 0 0 0  PHQ - 2 Score 0 0 0     Fall Risk: Fall Risk  09/05/2015 03/07/2015 01/03/2015  Falls in the past year? Yes Yes Yes  Number falls in past yr: 1 1 1   Injury with Fall? No Yes Yes     Functional Status Survey: Is the patient deaf or have difficulty hearing?: No Does the patient have difficulty seeing, even when wearing glasses/contacts?: Yes Does the patient have difficulty concentrating, remembering, or making decisions?: No Does the patient have difficulty walking or climbing stairs?: No Does the patient have difficulty dressing or bathing?: No Does the patient have difficulty doing errands alone such as visiting a doctor's office or shopping?: No    Assessment & Plan  1. Otalgia, right  Possible TMJ versus eustachian tube dysfunction, we will try Prednisone and Flonase. Advised to call back for referral to ENT if no improvement. She is allergic to NSAID's - predniSONE (DELTASONE) 10 MG tablet; Take 1 tablet (10 mg total) by mouth daily with breakfast.  Dispense: 10 tablet; Refill: 0 - fluticasone  (FLONASE) 50 MCG/ACT nasal spray; Place 2 sprays into both nostrils daily.  Dispense: 16 g; Refill: 2  2. Benign hypertension  - potassium chloride (KLOR-CON) 20 MEQ packet; Take 20 mEq by mouth daily.  Dispense: 30 tablet; Refill: 5 - losartan-hydrochlorothiazide (HYZAAR) 50-12.5 MG tablet; Take 1 tablet by mouth daily.  Dispense: 30 tablet; Refill: 5 - furosemide (LASIX) 20 MG tablet; Take 2 tablets (40 mg total) by mouth daily as needed for fluid or edema.  Dispense: 90 tablet; Refill: 1  3. Cervical radiculitis  stable  4. Chronic right shoulder pain  Keep follow up with Ortho  5. Chronic low back pain  - lidocaine (LIDODERM) 5 %; Place 2 patches onto the skin daily.  Dispense: 60 patch; Refill: 5  6. Hives  - hydrOXYzine (ATARAX/VISTARIL) 25 MG tablet; Take 1 tablet (25 mg total) by mouth 3 (three) times daily as needed.  Dispense: 60 tablet; Refill: 0

## 2015-09-15 NOTE — Unmapped (Signed)
Husband states filling out [employer] form, form asks for info regarding testing she had done in 2012 or 2013.  Please call husband.

## 2015-09-15 NOTE — Unmapped (Signed)
Returned call and answered questions for disability paperwork.  Call complete.

## 2015-09-30 NOTE — Unmapped (Signed)
Spoke with patient. Called to check status of paperwork. Social Security requesting back asap.

## 2015-10-06 NOTE — Unmapped (Signed)
Pt husband calling requesting pt be seen sooner than appt, pt is sleeping excessively and losing balance for a week now, pt is extremely tired, cannot preform simple daily functions throughout the day, pls advise

## 2015-10-06 NOTE — Unmapped (Signed)
Returned call to husband.  LM that the [facility] office is booked as she is only there one day a week.  I suggested them to get in to [facility] or be put on cancellation list.  Told him to call back and follow prompts to scheduling.

## 2015-10-08 NOTE — Unmapped (Signed)
Patient is requesting a letter explaining his condition. Please advise

## 2015-10-08 NOTE — Unmapped (Signed)
Pt's husband is calling to inform that the social security administration rep suggested that a letter fr Dr Gerilyn Pilgrim that explains the pt's condition would help in getting things taken care of. Pls advise.

## 2015-10-16 NOTE — Unmapped (Signed)
Coralyn Helling MSW will have Dr Gerilyn Pilgrim sign the letter and mail it to pt.

## 2015-10-16 NOTE — Unmapped (Addendum)
Letter mailed to patient.  Left voice message for patient's husband advising that letter was mailed to their home.

## 2015-10-21 ENCOUNTER — Ambulatory Visit: Admit: 2015-10-21 | Payer: PRIVATE HEALTH INSURANCE | Attending: Adult Health

## 2015-10-21 DIAGNOSIS — G10 Huntington's disease: Secondary | ICD-10-CM

## 2015-10-21 MED ORDER — risperiDONE (RISPERDAL) 1 MG tablet
1 | Freq: Every evening | ORAL | Status: AC
Start: 2015-10-21 — End: 2016-03-09

## 2015-10-21 MED ORDER — clonazePAM (KLONOPIN) 1 MG tablet
1 | ORAL_TABLET | Freq: Every evening | ORAL | Status: AC
Start: 2015-10-21 — End: 2016-03-16

## 2015-10-21 NOTE — Unmapped (Signed)
Subjective:      Patient ID: Brenda [name] is a 60 y.o. female.    HPI   Movement Disorders HPI    The patient presents for follow-up accompanied by her husband.?? He now has [facility] [employer] caregivers 3 mornings a week.  She has had several falls and refuses to use a cane or walker.?? She is having more balance issues but does not want to do physical therapy.  Her husband gave me a note saying she is more stubborn.  She continues to have friends who take her out and she goes to stay with her daughter and cousin on some days. She is very compulsive in her questioning and her husband says he has to repeat himself several times daily.??The perseveration did not improve any with the increased risperidone.  She is sleeping better at night but c/o fatigue during the day.?? She has swallowing difficulties which are increasing in frequency. She thinks her mouth feels smaller.  This started when the risperidone was increased to 3 tabs at hs.  She has not had an MBS in 4 years.    Her initial history follows: she first noticed racy, speedy spells in December 2010. These spells consist of her mind racing, she starts speaking, her muscles get tight. Initially she thought these spells were due to taking steroids (for asthma). Shortly afterwards she noticed generalized involuntary movements. She describes them as twitches. She started dropping objects, since mid 2011. She denies difficulty walking, but she admits that her balance can be off at times. In November 2011 she was rocking in a chair and she fell backwards with the whole chair. In 2010 she was trying to step over a dog gate and she fell and hit her face; she suffered a left orbital fracture. She has noticed that she is bumping into things while walking. She still walks and plays golf. She denies hallucinations, delusions, lightheadedness, fainting spells, daytime sedation, nausea or vomiting. She denies memory problems. She admits occasional feelings of depression,  especially in the winter. She has tried sertraline in the past as well as escitalopram but stopped these previously due to side effects. 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 She discontinued sertraline in the past. She did not feel the need to start tetrabenazine, partially because it would be difficult to get. ??         Histories:     She has a past medical history of Asthma; Environmental allergies; Superficial injury of face without infection (04/12/2009); Hematoma, left eyebrow (04/18/2009); Huntington's chorea (08/11/2010); Esophageal reflux (08/10/2010); Osteopenia (08/10/2010); Fracture of left orbit (2010); Anxiety; Fatigue (05/24/2011); and Chronic sinusitis (05/24/2011).    She has past surgical history that includes Sinus surgery and Tonsillectomy.    Her family history includes Alzheimer's disease in her father; Anxiety disorder in her sister; Depression in her sister; Huntington's disease in her maternal uncle; Huntington's disease (age of onset: 73) in her mother; Pulmonary embolism in her mother; Suicidality in her sister.    She reports that she has never smoked. She has never used smokeless tobacco. She reports that she drinks about 0.6 oz of alcohol per week. She reports that she does not use illicit drugs.      Review of Systems   Constitutional: Positive for fatigue.   HENT: Positive for trouble swallowing.    Respiratory: Positive for cough and choking. Negative for chest tightness, shortness of breath and stridor.    Cardiovascular:  Negative for leg swelling.   Gastrointestinal: Negative for constipation.   Musculoskeletal: Positive for gait problem.   Neurological: Positive for speech difficulty.   Psychiatric/Behavioral: Positive for depression, confusion and sleep disturbance.       Allergies:   Zithromax; Bactrim; Erythromycin; Erythromycin base; Methylphenidate; and Sulfa (sulfonamide antibiotics)    Medications:     Outpatient Encounter Prescriptions  as of 10/21/2015   Medication Sig Dispense Refill   ??? cholecalciferol, vitamin D3, 1000 units tablet Take 1,000 Units by mouth daily.     ??? clonazePAM (KLONOPIN) 1 MG tablet 2 tablets (2 mg total) at bedtime. (Patient taking differently: 3 mg at bedtime.   )     ??? lithium (ESKALITH) 450 MG CR tablet Take 450 mg by mouth At bedtime.     ??? mirabegron (MYBETRIQ) 50 mg Tb24 Take 50 mg by mouth daily. Indications: Bladder Hyperactivity     ??? multivitamin (THERAGRAN) tablet Take 1 tablet by mouth daily.       ??? omeprazole (PRILOSEC) 40 MG capsule Take 40 mg by mouth every morning before breakfast. Indications: Gastroesophageal Reflux     ??? risperiDONE (RISPERDAL) 1 MG tablet 0.5 mg in the morning and 2.5 mg at bedtime.       ??? traZODone (DESYREL) 100 MG tablet Take 1 tablet (100 mg total) by mouth at bedtime. Indications: SLEEP 90 tablet 3     No facility-administered encounter medications on file as of 10/21/2015.        Objective:       BP 121/67 mmHg   Pulse 90   Ht 5' (1.524 m)   Wt 136 lb (61.689 kg)   BMI 26.56 kg/m2   LMP 05/20/2011    Neurologic Exam  UHDRS 1: Motor Examination  Huntington's Disease Rating Scale  Ocular pursuit, horizontal: 2  Ocular pursuit, vertical: 2  Saccade initiation, horizontal: 3  Saccade initiation, vertical: 3  Saccade velocity, hortizontal : 2  Saccade velocity, vertical: 2  Dysarthria : 2  Tongue Protrusion: 0  Fingers taps, right: 2  Fingers taps, left : 3  Pronate/supinate hands, right : 2  Pronate/supinate hands, left : 2  Luria (fist-hand-palm test): 4  Rigidity arms, right : 1  Rigidity arms, left: 2  Body Bradykinesia: 2  Maximal dystonia, trunk: 0  Maximal dystonia, RUE: 0  Maximal dystonia, LUE: 1  Maximal dystonia, RLE: 1  Maximal dystonia, LLE: 1  Maximal chorea, face: 0  Maixmal chorea, BOL: 0  Maximal chorea, trunk: 0   Maximal chorea, RUE: 0  Maximal chorea, LUE: 0  Maximal chorea, RLE: 0  Maximal chorea, LLE: 0  Gait: 2  Tandem Walking: 3  Retropulsion Pull Test:  2  Diagnosis confidence level: 4  UHDRS 1 TOTAL:: 44    Physical Exam      Assessment:     1.?? Huntington's disease ??       She has family history of HD, and her history and examination are consistent with a clinical diagnosis of Huntington's disease, progressively worse over time. 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 She is taking risperidone, which has helped with psychiatric symptoms. Obsessing and perseveration continues and did not improve with a small increase in risperidone and has caused daytime fatigue.  Her balance is worse over time as well as cognition. Sleep is better. Swallowing problems are worse.  Plan:     1. Told her husband to decrease risperidone to 2.5?? mg at bedtime. They have follow-up with Dr. Corinne Ports soon. ??  2. Continue clonazepam 3 tablets at bedtime. ??  3. Increase trazodone from 100 mg qhs to 150 mg for sleep. ??  4. Continue to follow up with Psychiatry. ??  5. Modified barium swallow.  Husband will schedule.??  6. Continue exercise. ??  7. Follow-up in 2 months with Dr. Gerilyn Pilgrim.     Time spent with patient:   Established:    25 min  More than 50% of this time was spent discussing:   * Diagnosis  * Management  * Treatment Plan

## 2015-10-21 NOTE — Unmapped (Addendum)
1.  Reduce risperidone to 2.5 mg at bedtime.  2.  Increase trazodone to 150 mg at bedtime.  3.  Modified barium swallow.  The phone number to schedule at [facility] was given to him.

## 2015-10-30 ENCOUNTER — Inpatient Hospital Stay

## 2015-10-31 ENCOUNTER — Encounter

## 2015-12-04 ENCOUNTER — Ambulatory Visit: Admit: 2015-12-04 | Payer: PRIVATE HEALTH INSURANCE | Attending: Neurology

## 2015-12-04 DIAGNOSIS — R1310 Dysphagia, unspecified: Secondary | ICD-10-CM

## 2015-12-04 NOTE — Unmapped (Signed)
Subjective:      Patient ID: Brenda [name] is a 60 y.o. female.    HPI   Movement Disorders HPI      The patient presents for follow-up of HD.  She is having more difficulty with balance and has had some falls. She has someone with her at all times now. Swallowing is still an issue and she hasn't had a MBS yet which was ordered at the last visit. Cognition continues to worsen over time. Weight is stable.  She has mild chorea. Mood is stable. Sleep is good. She continues to follow-up with Dr. Corinne Ports in psychiatry.     Her initial history follows: she first noticed racy, speedy spells in December 2010. These spells consist of her mind racing, she starts speaking, her muscles get tight. Initially she thought these spells were due to taking steroids (for asthma). Shortly afterwards she noticed generalized involuntary movements. She describes them as twitches. She started dropping objects, since mid 2011. She denies difficulty walking, but she admits that her balance can be off at times. In November 2011 she was rocking in a chair and she fell backwards with the whole chair. In 2010 she was trying to step over a dog gate and she fell and hit her face; she suffered a left orbital fracture. She has noticed that she is bumping into things while walking. She still walks and plays golf. She denies hallucinations, delusions, lightheadedness, fainting spells, daytime sedation, nausea or vomiting. She denies memory problems. She admits occasional feelings of depression, especially in the winter. She has tried sertraline in the past as well as escitalopram but stopped these previously due to side effects. 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 She discontinued sertraline in the past. She did not feel the need to start tetrabenazine, partially because it would be difficult to get. ??             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    She has past surgical history that includes Sinus surgery and Tonsillectomy.    Her family history includes Alzheimer's disease in her father; Anxiety disorder in her sister; Depression in her sister; Huntington's disease in her maternal uncle; Huntington's disease (age of onset: 15) in her mother; Pulmonary embolism in her mother; Suicidality in her sister.    She reports that she has never smoked. She has never used smokeless tobacco. She reports that she drinks about 0.6 oz of alcohol per week. She reports that she does not use illicit drugs.      Review of Systems: As in HPI    Allergies:   Zithromax; Bactrim; Erythromycin; Erythromycin base; Methylphenidate; and Sulfa (sulfonamide antibiotics)    Medications:     Outpatient Encounter Prescriptions as of 12/04/2015   Medication Sig Dispense Refill   ??? cholecalciferol, vitamin D3, 1000 units tablet Take 1,000 Units by mouth daily.     ??? clonazePAM (KLONOPIN) 1 MG tablet 3 tablets (3 mg total) at bedtime. 60 tablet    ??? lithium (ESKALITH) 450 MG CR tablet Take 450 mg by mouth At bedtime.     ??? mirabegron (MYBETRIQ) 50 mg Tb24 Take 50 mg by mouth daily. Indications: Bladder Hyperactivity     ???  multivitamin (THERAGRAN) tablet Take 1 tablet by mouth daily.       ??? omeprazole (PRILOSEC) 40 MG capsule Take 40 mg by mouth every morning before breakfast. Indications: Gastroesophageal Reflux     ??? risperiDONE (RISPERDAL) 1 MG tablet 2.5 tablets (2.5 mg total) at bedtime. (Patient taking differently: 3 mg at bedtime.   )     ??? traZODone (DESYREL) 100 MG tablet Take 1 tablet (100 mg total) by mouth at bedtime. Indications: SLEEP 90 tablet 3     No facility-administered encounter medications on file  as of 12/04/2015.        Objective:       Blood pressure 118/80, pulse 60, resp. rate 15, height 5' 5.5 (1.664 m), weight 139 lb (63.05 kg), last menstrual period 05/20/2011.    Neurologic Exam    Physical Exam  UHDRS 1: Motor Examination  Huntington's Disease Rating Scale  Ocular pursuit, horizontal: 2  Ocular pursuit, vertical: 2  Saccade initiation, horizontal: 3  Saccade initiation, vertical: 3  Saccade velocity, hortizontal : 2  Saccade velocity, vertical: 2  Dysarthria : 2  Tongue Protrusion: 2  Fingers taps, right: 2  Fingers taps, left : 2  Pronate/supinate hands, right : 2  Pronate/supinate hands, left : 3  Luria (fist-hand-palm test): 3  Rigidity arms, right : 1  Rigidity arms, left: 2  Body Bradykinesia: 2  Maximal dystonia, trunk: 0  Maximal dystonia, RUE: 0  Maximal dystonia, LUE: 1  Maximal dystonia, RLE: 1  Maximal dystonia, LLE: 1  Maximal chorea, face: 1  Maixmal chorea, BOL: 1  Maximal chorea, trunk: 0   Maximal chorea, RUE: 0  Maximal chorea, LUE: 0  Maximal chorea, RLE: 1  Maximal chorea, LLE: 1  Gait: 2  Tandem Walking: 3  Retropulsion Pull Test: 2  Diagnosis confidence level: 4  UHDRS 1 TOTAL:: 49       Assessment:     1. Huntington's disease    2. Dysphagia, unspecified type      She has family history of HD, and her history and examination are consistent with a clinical diagnosis of Huntington's disease, progressively worse over time. 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 She is taking risperidone, which has helped with psychiatric symptoms.?? Her balance is worse over time as well as cognition. Sleep is better. Weight is stable. Swallowing problems are still an issue.    Plan:   1. MBS to evaluate dysphagia.   2. Continue clonazepam 3 tablets at bedtime. ??  3. Continue trazodone 100 mg qhs.   4. Continue risperidone 3 mg at bedtime.   5. Follow-up with psychiatry. ??  6. Follow-up in 5 months with me.

## 2015-12-11 ENCOUNTER — Encounter: Payer: Self-pay | Admitting: Family Medicine

## 2015-12-11 ENCOUNTER — Ambulatory Visit (INDEPENDENT_AMBULATORY_CARE_PROVIDER_SITE_OTHER): Payer: Managed Care, Other (non HMO) | Admitting: Family Medicine

## 2015-12-11 VITALS — BP 138/74 | HR 84 | Temp 97.9°F | Resp 16 | Ht 65.0 in | Wt 242.2 lb

## 2015-12-11 DIAGNOSIS — K1120 Sialoadenitis, unspecified: Secondary | ICD-10-CM | POA: Diagnosis not present

## 2015-12-11 DIAGNOSIS — K115 Sialolithiasis: Secondary | ICD-10-CM

## 2015-12-11 DIAGNOSIS — K118 Other diseases of salivary glands: Secondary | ICD-10-CM

## 2015-12-11 MED ORDER — AMOXICILLIN-POT CLAVULANATE 875-125 MG PO TABS: 1.0000 | ORAL_TABLET | Freq: Two times a day (BID) | ORAL | Status: DC

## 2015-12-11 NOTE — Progress Notes (Signed)
Name: Chelsea Werner   MRN: [id number]    DOB: Jul 10, 1955   Date:12/11/2015       Progress Note  Subjective  Chief Complaint  Chief Complaint  Patient presents with  . Allergic Reaction    patient stated that she started having lip swelling, diarrhea and rash on her neck after taking a combination of the antibiotic (clindamycin) and tramadol for salivary stones. she stated that the swelling has decreased on the right side of her face but the pain is still there. she did take some atarax for the itching.    HPI  Salivary gland infection/stone: she developed swelling of right side of her face associated with pain, while eating 4 days ago. She went to [facility] and was given Clindamycin and Tramadol, she took first dose on Tuesday pm and within 5 hours she developed angioedema of lips, she stopped Tramadol and continue taking clindamycin but swelling decreased but still feels puffy to her. She has been eating sour snacks but was not told to increase fluid intake. Swelling and pain of right salivary gland has improved, but still uncomfortable. She has not been able to go to work yesterday or today. Fever has subsided. No toothache. Appetite is normal, no nausea or vomiting, but has diarrhea since started antibiotics. She has been vaccinated for Mumps CT scan showed stone calculus   Patient Active Problem List   Diagnosis Date Noted  . 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 Fibrocystic breast disease 01/03/2015  . Cervical radiculitis 01/03/2015  . Extreme obesity (HCC) 01/03/2015  . Migraine with aura and without status migrainosus, not intractable 01/03/2015  . LA (lupus anticoagulant) disorder (HCC) 01/03/2015  . 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 Benign hypertension 01/03/2015  . Vitamin D deficiency 01/03/2015  . History of TB (tuberculosis) 01/03/2015  . Hives 01/03/2015  . History of pulmonary embolus (PE) 12/16/2014  . Thrombocytopenia (HCC) 07/01/2014  . Chronic right  shoulder pain 04/04/2014  . Edema   . Sacral back pain 04/10/2012  . Chronic low back pain   . Iritis, chronic   . Allergic rhinitis   . Personal history of tuberculosis 10/26/2007    Past Surgical History  Procedure Laterality Date  . Breast biopsy  1988, 2008  . Abdominal hysterectomy  1999-2000  . Back surgery  1984  . Myomectomy  1988  . Plantar fasciectomy  2013  . Cesarean section      Family History  Problem Relation Age of Onset  . Early death Mother   . Hypertension Father   . Thyroid cancer Sister   . Deep vein thrombosis Sister   . Parkinson's disease Brother   . Prostate cancer Brother     Social History   Social History  . Marital Status: Widowed    Spouse Name: N/A  . Number of Children: 1  . Years of Education: N/A   Occupational History  . 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 Smoking status: Never Smoker   . Smokeless tobacco: Never Used  . Alcohol Use: 0.0 oz/week    0 Standard drinks or equivalent per week     Comment: occasional  . Drug Use: No  . Sexual Activity: No   Other Topics Concern  . Not on file   Social History Narrative   Regular exercise-no   Caffeine Use-yes     Current outpatient prescriptions:  .  amoxicillin-clavulanate (AUGMENTIN) 875-125 MG tablet, Take 1  tablet by mouth 2 (two) times daily., Disp: 20 tablet, Rfl: 0 .  clobetasol cream (TEMOVATE) 0.05 %, Apply 1 application topically as needed., Disp: , Rfl:  .  fluticasone (FLONASE) 50 MCG/ACT nasal spray, Place 2 sprays into both nostrils daily., Disp: 16 g, Rfl: 2 .  furosemide (LASIX) 20 MG tablet, Take 2 tablets (40 mg total) by mouth daily as needed for fluid or edema., Disp: 90 tablet, Rfl: 1 .  hydrocortisone valerate cream (WESTCORT) 0.2 %, Apply topically., Disp: , Rfl:  .  hydrOXYzine (ATARAX/VISTARIL) 25 MG tablet, Take 1 tablet (25 mg total) by mouth 3 (three) times daily as needed., Disp: 60 tablet, Rfl: 0 .   Hypromellose 0.3 % SOLN, Apply 1 drop to eye 2 (two) times daily., Disp: 1 Bottle, Rfl: 5 .  lidocaine (LIDODERM) 5 %, Place 2 patches onto the skin daily., Disp: 60 patch, Rfl: 5 .  loratadine (CLARITIN) 10 MG tablet, Take 1 tablet (10 mg total) by mouth daily., Disp: 90 tablet, Rfl: 1 .  losartan-hydrochlorothiazide (HYZAAR) 50-12.5 MG tablet, Take 1 tablet by mouth daily., Disp: 30 tablet, Rfl: 5 .  Multiple Vitamins-Minerals (WOMENS MULTIVITAMIN PLUS) TABS, Take 1 tablet by mouth daily., Disp: , Rfl:  .  omega-3 acid ethyl esters (LOVAZA) 1 G capsule, Take by mouth 2 (two) times daily., Disp: , Rfl:  .  Polyethyl Glycol-Propyl Glycol (SYSTANE) 0.4-0.3 % GEL ophthalmic gel, Place 1 application into both eyes 2 (two) times daily., Disp: 1 Bottle, Rfl: 5 .  potassium chloride (KLOR-CON) 20 MEQ packet, Take 20 mEq by mouth daily., Disp: 30 tablet, Rfl: 5 .  Probiotic Product (PROBIOTIC FORMULA PO), Take 1 capsule by mouth daily., Disp: , Rfl:   Allergies  Allergen Reactions  . Metaxalone Rash  . Ace Inhibitors Cough  . Nsaids Hives  . Sulfa Antibiotics Hives  . Clindamycin/Lincomycin Swelling and Rash  . Tramadol Swelling and Rash     ROS  Ten systems reviewed and is negative except as mentioned in HPI   Objective  Filed Vitals:   12/11/15 1202  BP: 138/74  Pulse: 84  Temp: 97.9 F (36.6 C)  TempSrc: Oral  Resp: 16  Height:  (1.651 m)  Weight: 242 lb 3.2 oz (109.861 kg)  SpO2: 98%    Body mass index is 40.3 kg/(m^2).  Physical Exam  Constitutional: Patient appears well-developed and well-nourished. Obese  No distress.  HEENT: head atraumatic, normocephalic, pupils equal and reactive to light, ears normal bilaterally, tender right parotid gland, mildly swollen, no redness, normal oral exam neck supple, throat within normal limits Cardiovascular: Normal rate, regular rhythm and normal heart sounds.  No murmur heard. No BLE edema. Pulmonary/Chest: Effort normal and  breath sounds normal. No respiratory distress. Abdominal: Soft.  There is no tenderness. Psychiatric: Patient has a normal mood and affect. behavior is normal. Judgment and thought content normal.  PHQ2/9: Depression screen [facility] 2/9 12/11/2015 09/05/2015 03/07/2015 01/03/2015  Decreased Interest 0 0 0 0  Down, Depressed, Hopeless 0 0 0 0  PHQ - 2 Score 0 0 0 0    Fall Risk: Fall Risk  12/11/2015 09/05/2015 03/07/2015 01/03/2015  Falls in the past year? No Yes Yes Yes  Number falls in past yr: - Injury with Fall? - No Yes Yes    Functional Status Survey: Is the patient deaf or have difficulty hearing?: No Does the patient have difficulty seeing, even when wearing glasses/contacts?: No Does the patient have  difficulty concentrating, remembering, or making decisions?: No Does the patient have difficulty walking or climbing stairs?: No Does the patient have difficulty dressing or bathing?: No Does the patient have difficulty doing errands alone such as visiting a doctor's office or shopping?: No    Assessment & Plan  1. Salivary gland infection  - amoxicillin-clavulanate (AUGMENTIN) 875-125 MG tablet; Take 1 tablet by mouth 2 (two) times daily.  Dispense: 20 tablet; Refill: 0  2. Salivary gland obstruction  - amoxicillin-clavulanate (AUGMENTIN) 875-125 MG tablet; Take 1 tablet by mouth 2 (two) times daily.  Dispense: 20 tablet; Refill: 0  3. Calculus of parotid gland duct  Increase fluid intake, change to augmentin and continue sour snacks

## 2015-12-11 NOTE — Patient Instructions (Signed)
Parotitis Parotitis is soreness and inflammation of one or both parotid glands. The parotid glands produce saliva. They are located on each side of the face, below and in front of the earlobes. The saliva produced comes out of tiny openings (ducts) inside the cheeks. In most cases, parotitis goes away over time or with treatment. If your parotitis is caused by certain long-term (chronic) diseases, it may come back again.  CAUSES  Parotitis can be caused by:  Viral infections. Mumps is one viral infection that can cause parotitis.  Bacterial infections.  Blockage of the salivary ducts due to a salivary stone.  Narrowing of the salivary ducts.  Swelling of the salivary ducts.  Dehydration.  Autoimmune conditions, such as sarcoidosis or Sjogren syndrome.  Air from activities such as scuba diving, glass blowing, or playing an instrument (rare).  Human immunodeficiency virus (HIV) or acquired immunodeficiency syndrome (AIDS).  Tuberculosis. SIGNS AND SYMPTOMS   The ears may appear to be pushed up and out from their normal position.  Redness (erythema) of the skin over the parotid glands.  Pain and tenderness over the parotid glands.  Swelling in the parotid gland area.  Yellowish-white fluid (pus) coming from the ducts inside the cheeks.  Dry mouth.  Bad taste in the mouth. DIAGNOSIS  Your health care provider may determine that you have parotitis based on your symptoms and a physical exam. A sample of fluid may also be taken from the parotid gland and tested to find the cause of your infection. X-rays or computed tomography (CT) scans may be taken if your health care provider thinks you might have a salivary stone blocking your salivary duct. TREATMENT  Treatment varies depending upon the cause of your parotitis. If your parotitis is caused by mumps, no treatment is needed. The condition will go away on its own after 7 to 10 days. In other cases, treatment may  include:  Antibiotic medicine if your infection was caused by bacteria.  Pain medicines.  Gland massage.  Eating sour candy to increase your saliva production.  Removal of salivary stones. Your health care provider may flush stones out with fluids or remove them with tweezers.  Surgery to remove the parotid glands. HOME CARE INSTRUCTIONS   If you were prescribed an antibiotic medicine, finish it all even if you start to feel better.  Put warm compresses on the sore area.  Take medicines only as directed by your health care provider.  Drink enough fluids to keep your urine clear or pale yellow. SEEK IMMEDIATE MEDICAL CARE IF:   You have increasing pain or swelling that is not controlled with medicine.  You have a fever. MAKE SURE YOU:  Understand these instructions.  Will watch your condition.  Will get help right away if you are not doing well or get worse.   This information is not intended to replace advice given to you by your health care provider. Make sure you discuss any questions you have with your health care provider.   Document Released: 12/11/2001 Document Revised: 07/12/2014 Document Reviewed: 11/14/2014 Elsevier Interactive Patient Education 2016 [employer]  

## 2016-01-15 ENCOUNTER — Inpatient Hospital Stay: Admit: 2016-01-15 | Payer: PRIVATE HEALTH INSURANCE | Attending: Neurology

## 2016-01-15 DIAGNOSIS — R1310 Dysphagia, unspecified: Secondary | ICD-10-CM

## 2016-01-15 NOTE — Unmapped (Signed)
Speech Language Pathology  Oupt MBSS and tx note    Name: Brenda [name]  DOB: 1955/07/27  Attending Physician: Patrecia Pour, MD  Admission Diagnosis: Dysphagia, unspecified type [R13.10]  Date: 01/15/2016  Precautions: aspiration, fall  Reviewed Pertinent hospital course: Yes  Hospital Course SLP: Pt seen for an outpt MBSS.  Dx: Huntington's disease .  Pt's husband present during exam.  Pt has been choking with food and liquids.     General  General Information  Type of Study: Initial MBS  Diet Prior to this Study: Regular diet with thin liquids.  Meds whole with water    Reason for Referral  Patient was referred for a Initial MBS to assess the efficiency of his/her swallow function, rule out aspiration and make recommendations regarding safe dietary consistencies, effective compensatory strategies, and safe eating environment.    Orientation:  Person: Yes  Place: Yes  Time: Yes  Situation: Inconsistent    Pain:  Pain Score: 0-No pain    Goals  Goals Short Term Dysphagia  Patient and/or caregiver will demonstrate ability to prepare food and liquid consistencies at 80% accuracy: by 01/15/16  Patient and/or caregiver will demonstrate understanding of clinical signs and symptoms of aspiration at 80% accuracy: by 01/15/16     Time frame for goal to be met in: Outpt MBSS and one dysphagia tx session     Recommendations/Treatment  Recommendations/Treat  Recommendations: Follow-up modified barium swallow  Other Recommendations : Would benefit from outpt speech tx vs home health  Recommended Solid for Diet: Regular (caution with mixed consistencies (soups, cereal, canned fruits and fresh fruit))  Recommended Liquid for Diet: thin, nectar thickened  Liquid Administration Via: Cup (recommend control rate of flow (sippy cup) vs spoon)  Medication Administration:  (with pureed or nectar thick liquids)  Supervision: 1:1  Compensations: Slow rate, Small sips/bites, Double swallow, No straw  Postural Changes and/or Swallow Maneuvers:  Upright 90 degrees, Upright 30 min after meal    Oral Preparation / Oral Phase  Oral Presentation /Oral Phase  Oral Phase:  (Impulsive, take large bites and sips, requires max verbal cues and hand over hand contact to control bolus size)    Pharyngeal Phase  Pharyngeal Phase  Pharyngeal Phase: Impaired  Pharyngeal - Honey  Pharyngeal - Honey Cup: Pharyngeal residue - valleculae (min residuals base of tongue and valleculae after initial swallow.  Cleared with spontaneous reswalllow)  Pharyngeal - Nectar  Pharyngeal - Nectar Cup: Reduced epiglottic tilt (Inconsistent epiglottic inversion)  Pharyngeal - Thin  Pharyngeal - Thin Teaspoon: Reduced epiglottic tilt (No aspiration with controlled bolus amount)  Pharyngeal - Thin Cup: Significant aspiration (Amount), No bolus recovered, Aspiration before (Significant gross aspiraiton before trigger of pharyngeal swallow.  Strong reflexive cough, but no bolus recovered. )  Pharyngeal - Solids  Pharyngeal - Puree:  (min residuals base of tongue after initial swallow.  Able to clear with spontaneous reswallow)  Pharyngeal Phase - Comment  Pharyngeal Comment: Inconsistent epiglottic inversion/coordination.  Gross aspiration of thin liquids via cup.  No aspiration with thin liquid via tsp.  Min residuals after swallow on base of tongue and vallecuale.  Cleared with reswallow. Pt is high risks for aspiration.     Cervical Esophageal Phase  Cervical Esophagus Phase  Cervical Esophagus Phase: WFL    Prognosis  Prognosis  Prognosis: Guarded  Barriers to Reach Goals: Severity of dysphagia, Behavior  Barriers/Prognosis Comment: Medical dx  Individuals Consulted  Consulted and Agree with Results and Recommendations: Patient, Family  member  Family Member Consulted: husband     Provided 24 minutes of Speech Therapy treatment in addition to the above 30 minutes of Evaluation/Assessment. Treatment provided includes the following:    Treatment Note/Education:  1) Educated on s/sx of laryngeal  penetration/aspiration and associated risks.  2) Educated on swallowing anatomy/physiology. Video images reviewed with pt and pt's husband  3) Educated on safe swallow precautions (small sips/bites, slow rate,  upright seating during and after meal, lids to decrease bolus size)  4) Educated on diet recommendations and provided written recommendations. Provided samples of thickener.  Demonstrated how to thicken juice to nectar consistency.   5) Education on role of speech therapy.  6) Educated on POC and continuing speech therapy upon d/c.  Patient's husband verbalized understanding to all education. Pt present during education with limited comprehension of new information.      Time  Start Time: 1330  Stop Time: 1424  Time Calculation (min): 54 min    Charges   $ MBS Flouro Eval: 1 Procedure  $Swallow Tx : 1 Procedure    G-Code Primary Functional Limitation:    Visit #: Eval & Discharge, G-CODE: swallow (selection based on dysphagia assessment)  Goal status: CJ (20-40% impaired, limited, or restricted)    Patient Class   Outpatient    Fara Boros, M.S. CCC/SLP   Speech Therapist  [facility] phone: [phone]  01/15/2016

## 2016-01-20 NOTE — Unmapped (Signed)
Please tell patient's husband that I received the report from the modified barium swallow and ST was recommended based on the results which showed aspiration of thin liquids. Please ask whether they would like me to order ST through home health or if she wants to do outpatient ST.  Which ever he chooses, please send me orders to sign and fax.

## 2016-01-20 NOTE — Unmapped (Signed)
Spoke to pt's husband and relayed the message.  He would like to hold off on sending the orders through until he contacts [employer] to find out which facility would be more cost effective for them to go with.  He will call us back when he and his wife has decided where the order should be sent.  FYI

## 2016-01-22 NOTE — Unmapped (Signed)
Outpatient PT clinic information & contact #s given to patient's husband.     He had lots of  Questions regarding billing and insurance questions, was advised several times that he would most- likely need to speak w/ [employer] and also [facility] billing. He got upset stated he was at work and ended the call abruptly.

## 2016-01-22 NOTE — Unmapped (Signed)
Spoke with [name]. Would like to know where is recommended patient go for outpatient and inpatient therapy? Asked for contact info also.

## 2016-02-02 NOTE — Unmapped (Signed)
Tell him I apologize for the delay in getting him the results. Please tell patient's husband we can mail a copy of the modified barium swallow to him if he would prefer this.  Based on the results, speech therapy was recommended. She can do this on an outpatient basis or I can order home health and the ST can come to her.  I can do which ever way he prefers, just let me know I will sign the orders. Tell him the therapist will determine based on their assessment and Vance's progress how often and for how long sessions will be needed.

## 2016-02-02 NOTE — Unmapped (Signed)
Spoke with patients husband, [name]. States he was given patients Barium Swallow results verbally. He would like a copy of results. Stated he was waiting on activation code he requested through the [facility] website. I provided code,Activation Code: [id number]. Husband would like to know how many visits MD feels patient would need of speech therapy, any specifics on type of therapy?

## 2016-02-03 NOTE — Unmapped (Signed)
Mailed.  Encounter closed

## 2016-02-03 NOTE — Unmapped (Signed)
Spoke to pt's husband and explained the message below.  He would like a copy of the barium swallow along with the order for the outpatient ST and will speak to his insurance co re: where to go.  Order placed to have signed.  Order and test results will be mailed today.

## 2016-02-09 NOTE — Unmapped (Addendum)
Pt calling to get the MD name, information with PH number, he states he doesn't even know who to call to make an appt for the Barium Swallowing Test, pls advise

## 2016-02-09 NOTE — Unmapped (Signed)
Spoke to husband Kathlene November.  He called his insurance co this morning and was told that he needed to call the Dr office in order to get information about the # of allowed visits, co pays, ect.  I gave him the main # to [facility], and told him to ask for speech therapy.  He will call back if he has any questions. Encounter closed

## 2016-02-16 LAB — LITHIUM LEVEL: Lithium Lvl: 0.5 mmol/L — ABNORMAL LOW (ref 0.6–1.2)

## 2016-02-26 NOTE — Unmapped (Signed)
Please call husband regarding Speech Therapy done following barium swallow.

## 2016-02-26 NOTE — Unmapped (Signed)
Spoke to husband Kathlene November- he stated she had ST at [facility] last Friday and was wondering if the eval had been typed and sent over yet.  He is wanting to know what Dr Gerilyn Pilgrim thinks should be the next step.  I explained that Dr Gerilyn Pilgrim was out on vacation, but when we receive the report it will go to her for further instructions.  Husband verbalized understanding.

## 2016-03-08 DIAGNOSIS — E871 Hypo-osmolality and hyponatremia: Principal | ICD-10-CM

## 2016-03-08 MED ORDER — ondansetron (ZOFRAN) 4 mg/2 mL injection 4 mg
4 | Freq: Once | INTRAMUSCULAR | Status: AC
Start: 2016-03-08 — End: 2016-03-08
  Administered 2016-03-09: 02:00:00 4 mg via INTRAVENOUS

## 2016-03-08 MED ORDER — sodium chloride 0.9 % infusion
Freq: Once | INTRAVENOUS | Status: AC
Start: 2016-03-08 — End: 2016-03-09
  Administered 2016-03-09: 02:00:00 125 mL/h via INTRAVENOUS

## 2016-03-08 MED FILL — SODIUM CHLORIDE 0.9 % INTRAVENOUS SOLUTION: 125.00 125.00 mL/hr | INTRAVENOUS | Qty: 1000

## 2016-03-08 MED FILL — ONDANSETRON HCL (PF) 4 MG/2 ML INJECTION SOLUTION: 4 4 mg/2 mL | INTRAMUSCULAR | Qty: 2

## 2016-03-08 NOTE — Unmapped (Signed)
Pt. Brought to ED by husband for AMS. Pt. Has hx of Huntingtons Disease. Pt.'s husband states she has been acting weird all day. Pt. States she is fine. Husband states pt. Drank Bailey's, and then took an extra clonazepam as well as an extra half of Trazadone tonight before bed. Pt. Is now up and down, fidgeting and not making sense. Pt.'s PCP was called. Pt.'s PCP told husband to bring pt. To  ED for possible psych eval. Pt.'s husband also states she has been aspirating liquids lately and is supposed to be thickening her liquids. MD at bedside.

## 2016-03-08 NOTE — Unmapped (Signed)
[facility] EMERGENCY DEPARTMENT   EMERGENCY DEPARTMENT ENCOUNTER:    Critical Care Time:  Due to the immediate potential for life-threatening deterioration due to hyponatremia, I spent 37 minutes providing critical care.  This time is excluding time spent performing procedures.       Chief Complaint:  Altered mental status    Nursing notes reviewed and I agree except as noted below.  Old charts were also reviewed.     History of Present Illness:  Ms. Brenda [name] is a 60 y.o. Caucasian female with a history of Huntington's Chorea who presents with her husband.  Husband reports that the patient took an extra tablet of her clonazepam and trazodone today.  She also has had a little bit of alcohol to drink.  He reports that throughout the day today she has been increasingly agitated, up and about the house moving around constantly and drinking large amounts of water.  She is also had frequent nausea and vomiting.  She denies being in any pain but states that she is still nauseated.  No history of fever, diarrhea.  No known falls.  She denies suicidal ideation, states that she was trying to relax when she took the extra dose of her medication.    Past Medical History:    Past Medical History   Diagnosis Date   ??? Asthma    ??? Environmental allergies    ??? Superficial injury of face without infection 04/12/2009   ??? Hematoma, left eyebrow 04/18/2009   ??? 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   ??? Esophageal reflux 08/10/2010   ??? Osteopenia 08/10/2010   ??? Fracture of left orbit 2010   ??? Anxiety    ??? Fatigue 05/24/2011   ??? Chronic sinusitis 05/24/2011        Past Surgical History:    Past Surgical History   Procedure Laterality Date   ??? Sinus surgery     ??? Tonsillectomy          Social History:  Social History     Social History   ??? Marital Status: Married     Spouse Name: N/A   ??? Number of Children: N/A   ??? 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 Smoking status:  Never Smoker    ??? Smokeless tobacco: Never Used   ??? Alcohol Use: 0.6 oz/week     1 Cans of beer per week      Comment: Occasionally   ??? Drug Use: No   ??? Sexual Activity:     Partners: Male     Other Topics Concern   ??? Caffeine Use Yes   ??? Occupational Exposure No   ??? Exercise Yes   ??? Seat Belt Yes     Social History Narrative        Current Outpatient Medications:   Patient's Medications   New Prescriptions    No medications on file   Previous Medications    CHOLECALCIFEROL, VITAMIN D3, 1000 UNITS TABLET    Take 1,000 Units by mouth daily.    CLONAZEPAM (KLONOPIN) 1 MG TABLET    3 tablets (3 mg total) at bedtime.    LITHIUM (ESKALITH) 450 MG CR TABLET    Take 450 mg by mouth At bedtime.    MIRABEGRON (MYBETRIQ) 50 MG TB24    Take 50 mg by mouth daily. Indications: Bladder Hyperactivity    MULTIVITAMIN (  THERAGRAN) TABLET    Take 1 tablet by mouth daily.      OMEPRAZOLE (PRILOSEC) 40 MG CAPSULE    Take 40 mg by mouth every morning before breakfast. Indications: Gastroesophageal Reflux    RISPERIDONE (RISPERDAL) 1 MG TABLET    2.5 tablets (2.5 mg total) at bedtime.    TRAZODONE (DESYREL) 100 MG TABLET    Take 1 tablet (100 mg total) by mouth at bedtime. Indications: SLEEP   Modified Medications    No medications on file   Discontinued Medications    No medications on file        Allergies:    Allergies   Allergen Reactions   ??? Zithromax [Azithromycin] Anaphylaxis     Other reaction(s): Other (See Comments)  abdominal pain   ??? Bactrim [Sulfamethoxazole-Trimethoprim] Nausea Only and Hives   ??? Erythromycin Diarrhea   ??? Erythromycin Base Diarrhea and Nausea And Vomiting   ??? Methylphenidate Hives   ??? Sulfa (Sulfonamide Antibiotics) Hives        Family History:    Family History   Problem Relation Age of Onset   ??? Pulmonary embolism Mother    ??? Huntington's disease Mother 30   ??? Alzheimer's disease Father    ??? Depression Sister    ??? Suicidality Sister      attemped suicide 2005   ??? Anxiety disorder Sister    ??? Huntington's  disease Maternal Uncle         Review of Systems:  Negative for fever, chills, diarrhea, constipation, headache, neck pain, chest pain, shortness of breath, abdominal pain, dysuria, hematuria, blood in the urine or stool, rash.     Physical Exam:   ED Triage Vitals   Enc Vitals Group      BP 03/08/16 2200 106/53 mmHg      Heart Rate 03/08/16 2200 81      Resp 03/08/16 2200 18      Temp 03/08/16 2200 98 ??F (36.7 ??C)      Temp Source 03/08/16 2200 Oral      SpO2 03/08/16 2200 96 %      Weight 03/08/16 2200 157 lb (71.215 kg)      Height 03/08/16 2200 5' 5 (1.651 m)      Head Cir --       Peak Flow --       Pain Score 03/08/16 2200 Zero      Pain Loc --       Pain Edu? --       Excl. in GC? --       Filed Vitals:    03/08/16 2200   BP: 106/53   Pulse: 81   Temp: 98 ??F (36.7 ??C)   TempSrc: Oral   Resp: 18   Height: 5' 5 (1.651 m)   Weight: 157 lb (71.215 kg)   SpO2: 96%     Body mass index is 26.13 kg/(m^2).     GENERAL:   Awake, alert and oriented x 4, in no apparent distress.  Normal respiratory effort.  Body mass index is 26.13 kg/(m^2).   HEENT:  Atraumatic, normocephalic, PERRL, EOMI, oropharynx is clear with pink, moist mucous membranes, palatal arches are symmetric and uvula is midline  NECK:  Supple, no LAD, no thyromegaly, trachea is midline  LUNGS: CTAB without wheezes, rubs, rhonchi, rales or stridor, normal respiratory effort  CV: normal s1, s2, regular rate and rhythm without murmur, rub or gallop  ABDOMEN: soft, nontender, nondistended, no masses, no  hepatosplenomegaly, no rebound tenderness or guarding  EXTREMITIES: no edema or clubbing, 2+ radial and pedal pulses, no deformity  SKIN: warm, dry and intact, no bruising or lacerations  NEURO: Awake, alert and oriented x 4, moves all four extremities spontaneously and symmetrically, follows commands, speaks in full and fluent sentences, has movements typical of Huntington's.  PSYCHIATRIC: negative for suicidal or homicidal ideation.  Normal judgement, mood  and affect.      Laboratory Studies:    Labs Reviewed   BASIC METABOLIC PANEL - Abnormal; Notable for the following:     Sodium 121 (*)     Chloride 91 (*)     Glucose 106 (*)     Osmolality, Calculated 251 (*)     All other components within normal limits    Narrative:     As of 09/03/2014 the estimated GFR is calculated from serum creatinine using the Chronic Kidney Disease  Epidemiology Collaboration (CKD-EPI) equation in patients 18 years and older.  The reference range is   >60 mL/min/1.20m2.  eGFR values greater than 90 will be reported as >28mL/min/1.73m2.  Reference: Cherie Dark AS, Suella [place] [facility] Stefano Gaul AF, 3rd, Feldman HI, et. al.  A new equation to estimate glomerular filtration rate.  Ann Intern Med. 2009:150(9):604-12   CBC - Abnormal; Notable for the following:     WBC 13.1 (*)     All other components within normal limits   DIFFERENTIAL - Abnormal; Notable for the following:     Neutrophils Relative 82.1 (*)     Lymphocytes Relative 8.9 (*)     Neutrophils Absolute 69629 (*)     Monocytes Absolute 983 (*)     All other components within normal limits   HEPATIC FUNCTION PANEL - Abnormal; Notable for the following:     Total Protein 6.1 (*)     All other components within normal limits   URINALYSIS W/RFL TO MICROSCOP, NO CULT - Abnormal; Notable for the following:     Color, UA Colorless (*)     Specific Gravity, UA <1.005 (*)     All other components within normal limits    Narrative:     Microscopic testing is not performed when the dipstick is negative for blood, leukocyte, protein and nitrite.   VENOUS BLOOD GAS, LINE/SYRINGE - Abnormal; Notable for the following:     PCO2-Line Draw 39 (*)     PO2-Line Draw 55 (*)     %HBO2-Line Draw 87.6 (*)     Reduced Hemoglobin-Line Draw 10.4 (*)     All other components within normal limits   POC GLU MONITORING DEVICE - Abnormal; Notable for the following:     POC Glucose Monitoring Device 109 (*)     All other components within normal limits    LACTIC ACID   MAGNESIUM   PHOSPHORUS   LIPASE   URINE DRUG SCREEN WITHOUT CONFIRMATION, STAT   ETHANOL, SERUM   LITHIUM LEVEL         Radiology Studies:    No orders to display         Other Studies (Including Relevent Prior Studies):  none    Procedures:  none    Emergency Department Course:  Brenda [name] was seen and evaluated and remained hemodynamically stable throughout her stay.  I have placed the patient on a medical hold.  She is given normal saline.  I have spoken to the hospitalsit.      PATIENTS WHO INSIST ON  DISCHARGE AT SERIOUS RISK TO THEMSELVES: MEDICAL HOLD         Date/Time: 03/09/2016 / 12:42 AM    Patient Active Problem List   Diagnosis   ??? Huntington's chorea   ??? Asthma   ??? Esophageal reflux   ??? Osteopenia   ??? Fatigue   ??? Chronic sinusitis   ??? Anxiety   ??? Depression   ??? Paranoia   ??? Care involving speech-language therapy   ??? Hoarseness of voice   ??? Laryngopharyngeal reflux (LPR)   ??? Cough   ??? Allergic rhinitis   ??? GERD (gastroesophageal reflux disease)   ??? Anxiety and depression   ??? Huntington's disease   ??? Dysphagia   ??? Needs a medical hold   ??? Hyponatremia       This is a statement that there is reason to believe that the patient, Brenda [name] lacks decision-making capability to give informed consent and refuse treatment:     The temporary decision maker is the patient's:Spouse     The patient has the potential to indicates a desire to leave hospital    The patient is not verbalizing refusal of treatment attempting to leave hospital     Reasons given by her for desire to leave/refusal of medical treatment: none, patient is at risk for leaving as she has significant psychiatric history and is confused in the ER.    Significant risk (loss of life, limb, or bodily function) as explained to patient: yes, however she is not able to understand the severity of her condition.    Capacity evaluation:     She is able to understand medical problem: no    She is able to understand proposed treatment:  unsure    She is able to understand alternative to proposed treatment (if any): unsure    She is able to understand option of refusing proposed treatment (including withholding or withdrawing proposed treatment):  no    She is able to appreciate reasonably foreseeable consequences of accepting proposed treatment:  no    She is able to appreciate reasonable foreseeable consequences of refusing proposed treatment (including withholding or withdrawing proposed treatment):  no    This person's decision is affected by depression.  unsure    This person's decision is affected by delusion/psychosis.  yes    Overall Impression of Capacity:    Lacks capacity    Specific observations, patient comments, other supporting information: Patient with pyschogenic polydipsia and confusion with hyponatremia.  Husband is concerned that she might try to leave the hospital if she gets agitated overnight.  I feel a medical hold is necessary as the patient does not have capacity to make decisions right now.    Lynne Leader, MD  03/09/2016 12:42 AM    Medical Decision Making:  Ms. Brenda [name] is a 60 y.o. female who presents with acutely altered mental status, excessive thirst, polyuria and polydipsia.  Patient has evidence of an acute hyponatremia that is likely caused by psychogenic polydipsia.  Her Huntington's disease may be contributing.  She does not appear toxic or septic, there is no evidence of any acute infectious process.  Aside from being somewhat altered, she is neurologically at her baseline with her typical Huntington's chorea symptoms.  She is not acutely suicidal or homicidal, however patient is currently not capable of making decisions for herself, husband agrees, patient is admitted for further evaluation and treatment.  She is placed on fluid restrictions.    Impression:  1. Psychogenic polydipsia    2. Hyponatremia    3. Huntington chorea         Plan:    1.  As above.     Disposition:  Admitted to Raleigh Nation, MD  in stable condition.     Portions of this note were generated using voice recognition software.  Every effort was made to ensure the accuracy of transcription, however some discrepancies may remain.         Dartha Lodge, MD  03/09/16 [phone]

## 2016-03-08 NOTE — Unmapped (Signed)
Pt. Denies any SI/HI at this time.

## 2016-03-08 NOTE — Unmapped (Signed)
Patient to bathroom in wheel chair

## 2016-03-08 NOTE — Unmapped (Signed)
Patient placed on continuous cardiac monitor and pulse ox.

## 2016-03-09 ENCOUNTER — Inpatient Hospital Stay
Admission: EM | Admit: 2016-03-09 | Discharge: 2016-03-16 | Disposition: A | Discharge status: Skilled Nursing Facility | Payer: PRIVATE HEALTH INSURANCE

## 2016-03-09 LAB — BASIC METABOLIC PANEL
Anion Gap: 6 mmol/L (ref 3–16)
Anion Gap: 6 mmol/L (ref 3–16)
BUN: 10 mg/dL (ref 7–25)
BUN: 10 mg/dL (ref 7–25)
CO2: 24 mmol/L (ref 21–33)
CO2: 29 mmol/L (ref 21–33)
Calcium: 8.7 mg/dL (ref 8.6–10.3)
Calcium: 9.5 mg/dL (ref 8.6–10.3)
Chloride: 91 mmol/L — ABNORMAL LOW (ref 98–110)
Chloride: 105 mmol/L (ref 98–110)
Creatinine: 0.79 mg/dL (ref 0.60–1.30)
Creatinine: 0.85 mg/dL (ref 0.60–1.30)
Glucose: 106 mg/dL — ABNORMAL HIGH (ref 70–100)
Glucose: 93 mg/dL (ref 70–100)
Osmolality, Calculated: 251 mosm/kg — ABNORMAL LOW (ref 278–305)
Osmolality, Calculated: 289 mosm/kg (ref 278–305)
Potassium: 3.8 mmol/L (ref 3.5–5.3)
Potassium: 4.5 mmol/L (ref 3.5–5.3)
Sodium: 121 mmol/L — ABNORMAL LOW (ref 133–146)
Sodium: 140 mmol/L (ref 133–146)
eGFR AA CKD-EPI: >90 See note.
eGFR AA CKD-EPI: 87 See note.
eGFR NONAA CKD-EPI: 82 See note.
eGFR NONAA CKD-EPI: 75 See note.

## 2016-03-09 LAB — DIFFERENTIAL
Basophils Absolute: 39 /uL (ref 0–200)
Basophils Absolute: 26 /uL (ref 0–200)
Basophils Relative: 0.3 % (ref 0.0–1.0)
Basophils Relative: 0.3 % (ref 0.0–1.0)
Eosinophils Absolute: 157 /uL (ref 15–500)
Eosinophils Absolute: 138 /uL (ref 15–500)
Eosinophils Relative: 1.2 % (ref 0.0–8.0)
Eosinophils Relative: 1.6 % (ref 0.0–8.0)
Lymphocytes Absolute: 1166 /uL (ref 850–3900)
Lymphocytes Absolute: 1471 /uL (ref 850–3900)
Lymphocytes Relative: 8.9 % (ref 15.0–45.0)
Lymphocytes Relative: 17.1 % (ref 15.0–45.0)
Monocytes Absolute: 983 /uL (ref 200–950)
Monocytes Absolute: 757 /uL (ref 200–950)
Monocytes Relative: 7.5 % (ref 0.0–12.0)
Monocytes Relative: 8.8 % (ref 0.0–12.0)
Neutrophils Absolute: 10755 /uL (ref 1500–7800)
Neutrophils Absolute: 6209 /uL (ref 1500–7800)
Neutrophils Relative: 82.1 % (ref 40.0–80.0)
Neutrophils Relative: 72.2 % (ref 40.0–80.0)

## 2016-03-09 LAB — URINE DRUG SCREEN WITHOUT CONFIRMATION, STAT
Amphetamine, 500 ng/mL Cutoff: NEGATIVE
Barbiturates UR, 300  ng/mL Cutoff: NEGATIVE
Benzodiazepines UR, 300 ng/mL Cutoff: NEGATIVE
Buprenorphine, 5 ng/mL Cutoff: NEGATIVE
Cocaine UR, 300 ng/mL Cutoff: NEGATIVE
Fentanyl, 2 ng/mL Cutoff: NEGATIVE
Methadone, UR, 300 ng/mL Cutoff: NEGATIVE
Opiates UR, 300 ng/mL Cutoff: NEGATIVE
Oxycodone, 100 ng/mL Cutoff: NEGATIVE
THC UR, 50 ng/mL Cutoff: NEGATIVE
Tricyclic Antidepressants, 300 ng/mL Cutoff: NEGATIVE

## 2016-03-09 LAB — CBC
Hematocrit: 36.2 % (ref 35.0–45.0)
Hematocrit: 38.4 % (ref 35.0–45.0)
Hemoglobin: 12.3 g/dL (ref 11.7–15.5)
Hemoglobin: 12.8 g/dL (ref 11.7–15.5)
MCH: 29.2 pg (ref 27.0–33.0)
MCH: 28.8 pg (ref 27.0–33.0)
MCHC: 33.9 g/dL (ref 32.0–36.0)
MCHC: 33.3 g/dL (ref 32.0–36.0)
MCV: 86.3 fL (ref 80.0–100.0)
MCV: 86.5 fL (ref 80.0–100.0)
MPV: 9.6 fL (ref 7.5–11.5)
MPV: 9.6 fL (ref 7.5–11.5)
Platelets: 219 10*3/uL (ref 140–400)
Platelets: 202 10E3/uL (ref 140–400)
RBC: 4.19 10*6/uL (ref 3.80–5.10)
RBC: 4.44 10E6/uL (ref 3.80–5.10)
RDW: 12.4 % (ref 11.0–15.0)
RDW: 12.8 % (ref 11.0–15.0)
WBC: 13.1 10*3/uL (ref 3.8–10.8)
WBC: 8.6 10E3/uL (ref 3.8–10.8)

## 2016-03-09 LAB — HEPATIC FUNCTION PANEL
ALT: 15 U/L (ref 7–52)
AST: 21 U/L (ref 13–39)
Albumin: 3.9 g/dL (ref 3.5–5.7)
Alkaline Phosphatase: 52 U/L (ref 36–125)
Bilirubin, Direct: 0.1 mg/dL (ref 0.0–0.4)
Bilirubin, Indirect: 0.4 mg/dL (ref 0.0–1.1)
Total Bilirubin: 0.5 mg/dL (ref 0.0–1.5)
Total Protein: 6.1 g/dL (ref 6.4–8.9)

## 2016-03-09 LAB — POC GLU MONITORING DEVICE: POC Glucose Monitoring Device: 109 mg/dL (ref 70–100)

## 2016-03-09 LAB — URINALYSIS W/RFL TO MICROSCOPIC
Bilirubin, UA: NEGATIVE
Blood, UA: NEGATIVE
Glucose, UA: NEGATIVE mg/dL
Ketones, UA: NEGATIVE mg/dL
Leukocytes, UA: NEGATIVE
Nitrite, UA: NEGATIVE
Protein, UA: NEGATIVE mg/dL
Specific Gravity, UA: <1.005 (ref 1.005–1.035)
Urobilinogen, UA: <2 mg/dL (ref 0.2–1.9)
pH, UA: 6 (ref 5.0–8.0)

## 2016-03-09 LAB — VENOUS BLOOD GAS, LINE/SYRINGE
%HBO2-Line Draw: 87.6 % (ref 40.0–70.0)
Base Excess-Line Draw: 0.7 mmol/L (ref <=3.0)
CO2 Content-Line Draw: 26 mmol/L (ref 25–29)
Carboxyhgb-Line Draw: 0.7 %
HCO3-Line Draw: 25 mmol/L (ref 24–28)
Methemoglobin-Line Draw: 1.3 % (ref 0.0–1.5)
PCO2-Line Draw: 39 mm Hg (ref 41–51)
PH-Line Draw: 7.42 (ref 7.32–7.42)
PO2-Line Draw: 55 mm Hg (ref 25–40)
Reduced Hemoglobin-Line Draw: 10.4 % (ref 0.0–5.0)

## 2016-03-09 LAB — LIPASE: Lipase: 45 U/L (ref 4–82)

## 2016-03-09 LAB — TSH: TSH: 3.14 u[IU]/mL (ref 0.34–5.60)

## 2016-03-09 LAB — SODIUM: Sodium: 138 mmol/L (ref 133–146)

## 2016-03-09 LAB — LACTIC ACID: Lactate: 0.7 mmol/L (ref 0.5–2.2)

## 2016-03-09 LAB — LITHIUM LEVEL: Lithium Lvl: 0.5 mmol/L — ABNORMAL LOW (ref 0.6–1.2)

## 2016-03-09 LAB — ETHANOL, SERUM: Ethanol: <10 mg/dL (ref 0–10)

## 2016-03-09 LAB — PHOSPHORUS: Phosphorus: 2.1 mg/dL (ref 2.1–4.7)

## 2016-03-09 LAB — MAGNESIUM: Magnesium: 1.6 mg/dL (ref 1.5–2.5)

## 2016-03-09 MED ORDER — lithium (ESKALITH) ER tablet 450 mg
450 | Freq: Every evening | ORAL | Status: AC
Start: 2016-03-09 — End: 2016-03-16
  Administered 2016-03-10 – 2016-03-16 (×7): 450 mg via ORAL

## 2016-03-09 MED ORDER — pantoprazole (PROTONIX) EC tablet 40 mg
40 | Freq: Every day | ORAL | Status: AC
Start: 2016-03-09 — End: 2016-03-16
  Administered 2016-03-09 – 2016-03-16 (×8): 40 mg via ORAL

## 2016-03-09 MED ORDER — mirabegron (MYRBETRIQ) 24 hr tablet Tb24 50 mg
50 | Freq: Every day | ORAL | Status: AC
Start: 2016-03-09 — End: 2016-03-16
  Administered 2016-03-10 – 2016-03-16 (×7): 50 mg via ORAL

## 2016-03-09 MED ORDER — risperiDONE (RISPERDAL) tablet 3 mg
2 | Freq: Every evening | ORAL | Status: AC
Start: 2016-03-09 — End: 2016-03-10
  Administered 2016-03-10: 01:00:00 3 mg via ORAL

## 2016-03-09 MED ORDER — clonazePAM (klonoPIN) tablet 3 mg
1 | Freq: Every evening | ORAL | Status: AC
Start: 2016-03-09 — End: 2016-03-16
  Administered 2016-03-10 – 2016-03-16 (×7): 3 mg via ORAL

## 2016-03-09 MED ORDER — heparin (porcine) injection 5,000 Units
5000 | Freq: Three times a day (TID) | INTRAMUSCULAR | Status: AC
Start: 2016-03-09 — End: 2016-03-16
  Administered 2016-03-09 – 2016-03-16 (×18): 5000 [IU] via SUBCUTANEOUS

## 2016-03-09 MED ORDER — traZODone (DESYREL) tablet 100 mg
100 | Freq: Every evening | ORAL | Status: AC
Start: 2016-03-09 — End: 2016-03-16
  Administered 2016-03-10 – 2016-03-16 (×7): 100 mg via ORAL

## 2016-03-09 MED FILL — PANTOPRAZOLE 40 MG TABLET,DELAYED RELEASE: 40 40 MG | ORAL | Qty: 1

## 2016-03-09 MED FILL — CLONAZEPAM 1 MG TABLET: 1 1 MG | ORAL | Qty: 3

## 2016-03-09 MED FILL — TRAZODONE 100 MG TABLET: 100 100 MG | ORAL | Qty: 1

## 2016-03-09 MED FILL — LITHIUM CARBONATE ER 450 MG TABLET,EXTENDED RELEASE: 450 450 MG | ORAL | Qty: 1

## 2016-03-09 MED FILL — MYRBETRIQ 50 MG TABLET,EXTENDED RELEASE: 50 50 mg | ORAL | Qty: 1

## 2016-03-09 MED FILL — RISPERIDONE 1 MG TABLET: 1 1 MG | ORAL | Qty: 1

## 2016-03-09 MED FILL — HEPARIN (PORCINE) 5,000 UNIT/ML INJECTION SOLUTION: 5000 5,000 unit/mL | INTRAMUSCULAR | Qty: 1

## 2016-03-09 NOTE — Unmapped (Signed)
Problem: Discharge Planning  Goal: Identify discharge needs  Outcome: Adequate for Discharge Date Met:  03/09/16  Home with HHC versus Psych pt

## 2016-03-09 NOTE — Unmapped (Signed)
Problem: Fall Prevention  Goal: Patient will remain free of falls  Assess and monitor vitals signs, neurological status including level of consciousness and orientation. Reassess fall risk per hospital policy.    Ensure arm band on, uncluttered walking paths in room, adequate room lighting, call light and overbed table within reach, bed in low position, wheels locked, side rails up per policy, and non-skid footwear provided.   Outcome: Progressing

## 2016-03-09 NOTE — Unmapped (Signed)
[place]                              [facility]     PATIENT NAME:   ODA, PLACKE                  MRN: [id number]  DATE OF BIRTH:  February 14, 1956                     CSN: [id number]  ATTENDING:      Art Buff Clymer, D.O.           ADMIT DATE: 03/08/2016  CONSULTANT:     Raleigh Nation, M.D.  SERVICE:        Hospitalist/Infectious Disease  DICTATED BY:    Rosamaria Lints, M.D.              CONSULT DATE:                                 NEPHROLOGY CONSULTATION     REASON FOR CONSULTATION:  Hyponatremia.     HISTORY OF PRESENT ILLNESS:  This is a 60 year old white female who is known  to have a history of Huntington's Chorea and was brought to the emergency  room because of mental status changes.  She was found to have a sodium of  121. She may have taken extra doses of Clonazepam and Trazodone.  Although  the chart refers to possible alcohol ingestion, her ethanol level was less  than 10. She is suspected of having psychogenic polydipsia and told me that  she drank a gallon of water at home, but is very vague as to how often she  takes this much water.  She admitted to some vomiting and diarrhea. She  denied abdominal pain. She denied fever.  She states that her appetite has  been normal.  She denied using any diuretics. She admitted to a cough.     The patient was able to tell me where she was. She was not sure if it was the  end of August or the beginning of September.  She knew that it was Tuesday.  She knew the name of the President.       Her outpatient medications of note includ  Trazodone.     ALLERGIES:  1.  Zithromax.  2.  Bactrim.  3.  Erythromycin.  4.  Methylphenidate.  5.  Sulfa.     MEDICATIONS:  Home:  1. Vitamin D3.  2. Clonazepam.  3. Lithium.  4. Myrbetriq.  5. Theragran.  6. Prilosec.  7. Rispderal.  8. Trazodone.     MEDICATIONS: Here include:  1. Clonazepam.  2. Heparin.  3. Lithium.  4. Zofran.  5. Protonix.  6. Risperdal.  7. Trazodone.     PAST MEDICAL  HISTORY:  1. Huntington's chorea.  2. Asthma.  3. Environmental allergies.  4. Gastroesophageal reflux disease.  5. Osteopenia.  6. Anxiety.  7. Fatigue.  8. Chronic sinusitis.  9. Status post sinus surgery and tonsillectomy.     SOCIAL HISTORY:  She has not been a smoker. She apparently denied alcohol or  drug use.     REVIEW OF SYSTEMS:  Ten systems were reviewed and were negative unless  otherwise noted to be positive in the history of present illness.     PHYSICAL EXAMINATION:  GENERAL: She  was in no distress.  VITAL SIGNS:  Blood pressure 108/35, respirations 19, pulse 69.  She is  afebrile.  HEENT:  Head normocephalic.  Mouth - Membranes moist.  NECK:  No jugular venous distention.  LUNGS:  Clear.  HEART:  Rhythm was regular. There was no gallop, rub or murmur.  ABDOMEN:  Soft, nontender, no masses.  Bowel sounds hypoactive.  EXTREMITIES:  No edema.  SKIN:  No rash.     LABORATORY DATA:  September 4th sodium 121 and September 5th sodium 140.  Potassium 4.5, chloride 105, CO2 29, BUN 10, creatinine 0.85, glucose 93,  albumin 3.9.  Lithium 0.5.  Calcium 9.5, magnesium 1.6, phosphorus 2.1.  Hemoglobin 12.8, white count 8.6, platelet count 202,000.  Urinalysis -  Specific gravity less than 1.005, pH 6, negative blood, negative protein,  negative white cells.     IMPRESSION:  1. Hyponatremia which has corrected rather quickly with normal saline IV     fluid.  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  I would wonder whether she was     drinking large amounts of water driving down her sodium. The urine     specific gravity appears appropriately dilute.  Trazodone can cause     hyponatremia, but her sodium is improved despite staying on the Trazodone.  2. Huntington's chorea.     RECOMMENDATIONS:  1. Agree with checking urine osmolality and urine sodium.  2. Serial basic metabolic panels.  3. Will check a sodium this afternoon and decide whether or not to institute     a fluid restriction.     Thank you for allowing me to  participate in her care.                                                 RE/kh                                  Dictated by:  Rosamaria Lints, M.D.  D:  03/09/2016 09:10  T:  03/09/2016 14:26  Job #:  [id number]           NEPHROLOGY CONSULTATION                                      PAGE    1 of   1

## 2016-03-09 NOTE — Unmapped (Signed)
[id number]  #1 Hyponatremia. Will send urine osmolality. Will monitor closely. Will ask renal to see  #2 Huntingtons chorea. Will ask pyschiatry to see regarding worsening psychiatric symptoms. Risperidol. Lithiium

## 2016-03-09 NOTE — Unmapped (Signed)
Speech Language Pathology  Clinical Swallow Assessment/Treatment note    Pt at high risks for aspiration due to impulsivity and inconsistent epiglottic inversion/decreased coordination per recent oupt MBSS (01/15/16 at [facility]).  Currently recommend mech soft diet with honey thick liquids.  Pt requires full assistance with all PO intake.     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 Gross aspiration of thin liquids via cup.?? No aspiration with thin liquid via tsp.?? Min residuals after swallow on base of tongue and vallecuale.?? Cleared with reswallow. Pt is high risks for aspiration  Recommendations/Treatment  Recommendations/Treat  Recommendations: Follow-up modified barium swallow  Other Recommendations : Would benefit from outpt speech tx vs home health  Recommended Solid for Diet: Regular (caution with mixed consistencies (soups, cereal, canned fruits and fresh fruit))  Recommended Liquid for Diet: thin, nectar thickened  Liquid Administration Via: Cup (recommend control rate of flow (sippy cup) vs spoon)  Medication Administration:?? (with pureed or nectar thick liquids)  Supervision: 1:1  Compensations: Slow rate, Small sips/bites, Double swallow, No straw  Postural Changes and/or Swallow Maneuvers: Upright 90 degrees, Upright 30 min after meal    Orientation:  Person: Yes  Place: Inconsistent  Time: Inconsistent  Situation: Inconsistent    Pain:  Pain Score: 0-No pain     Aspiration Risk  Risk for Aspiration: Severe  Aspiration Risk Recommendations: Dysphagia treatment  Dysphagia Diagnosis: Mild oral stage dysphagia, Moderate to severe pharyngeal stage  dysphagia  Compensatory Swallowing Strategies: Upright as possible for all oral intake, Remain upright for 20-30 minutes after meals, Full supervision with meals, External pacing, No straws, Chin tuck, Small bites/sips, Eat/feed slowly  Diet Solids Recommendation: Mechanical Soft  Diet Liquids Recommendations: Honey thick only  Recommended Form of Meds: With puree (with pureed or honey thick liquids)    Goals     Goals Short Term Dysphagia  Patient will demonstrate use of swallowing exercises: by 03/16/16  Patient and/or caregiver will demonstrate ability to prepare food and liquid consistencies at 80% accuracy: by 03/16/16  Patient and/or caregiver will demonstrate understanding of clinical signs and symptoms of aspiration at 80% accuracy: by 03/16/16  Patient will demonstrate use of compensatory strategy: by 03/16/16 with moderate cues  Patient will demonstrate safe swallowing behaviors: by 03/16/16 with moderate cues  Goals Long Term Dysphagia  Patient will tolerate least restrictive diet with no signs or symptoms of aspiration : by 03/23/16   Time frame for goal to be met in: 3-7 x wee x 2 weekx by 03/23/16      Recommendation  Recommendations  Recommendation: Recommend SLP therapy at discharge  Duration of Treatment: 3-7 x week x 2 weeks by 03/23/16  Follow up treatments: Patient/Family education, Diet tolerance monitoring, Laryngeal strenghtening  Dysphagia Goals: Patient will demonstrate appropriate strategies for swallowing safety    Baseline Assessment  History of Intubation: No  Behavior/Cognition: Alert;Cooperative;Impulsive;Confused  Dentition: Adequate  Vision: Functional for self-feeding  Patient Positioning: Upright in bed  Volitional Cough: Strong  Volitional Swallow: WFL    Oral/Motor  Labial ROM: Within Functional Limits  Labial Symmetry: Within Functional Limits  Labial Strength: Within Functional Limits  Labial Sensation: Within Functional  Limits  Lingual ROM: Within Functional Limits  Lingual Symmetry:  Within Functional Limits  Lingual Strength: Reduced  Facial ROM: Within Functional Limits  Facial Symmetry: Within Functional Limits  Facial Strength: Within Functional Limits  Facial Sensation: Within Functional Limits  Velum: Within Functional Limits  Mandible: Within Functional Limits  Baseline Vocal Quality: Normal  Vocal Intensity: Moderately decreased  Gag: Unable to assess  Apraxia: None present  Intelligibility: Intelligible  Dysarthria : Ataxic  Dysarthria severity: Mild 75-90%  Breath Support: Adequate for speech  Dentition: Adequate  Hearing Exceptions: None    Consistencies Assessed  Nectar;Honey;Soft Solid    Nectar  Presentation: Cup;Self Fed  Oral: Within functional limits  Pharyngeal: Delayed Swallow;Cough - Immediate;Cough - Delayed;Throat Clearing - Delayed    Honey  Presentation: Cup;Self Fed  Oral: Within functional limits  Pharyngeal: Delayed Swallow    Soft Solid  Presentation: Self Fed  Oral: Within functional limits  Pharyngeal: Within functional limits     Assessment:  Pt seen for bedside swallowing eval with lunch.  Multiple family members present.  Pt was coughing/choking excessively prior to PO.  Husband reported that this is a habit at times.  Pt has received 2 outpt speech tx sessions working with pharyngeal strengthening exercises, compensatory techniques (chin tuck, throat clear, slow rate, and small bites/sips) and altering liquid consistencies.  Pt is currently on nectar thick liquids at home.  Pt assessed with hamburger, mac and cheese, nectar and honey thick liquids.  Pt was able to verbalize compensatory techniques, but not able to utilize effectively during meal even with max verbal and tactile cues.  Pt did present with coughing with nectar thick liquids.  Improvement noted with honey thick liquids via cup.  Pt required full assistance with PO due to impulsivity. Pt tends to choke easily (possible choking on saliva).  Pt takes very large bites and sips.  Pt is at very high  risks for aspiration due to nature of progressive disease, impulsivity, and  Inconsistent epiglottis inversion/decreased epiglottic coordination.  Pt is also very mobile at home and takes daily walks.      Provided 15 minutes of Speech Therapy treatment in addition to the above 25 minutes of Evaluation/Assessment. Treatment provided includes the following:    Treatment Note/Education:  1) Educated on s/sx of laryngeal penetration/aspiration and associated risks.  2) Educated on swallowing anatomy/physiology.  3) Educated on safe swallow precautions (small sips/bites, slow rate, no straws, chin tuck with liquids, throat clear after liquids, full assistance with meals)  4) Educated on diet recommendations and provided written recommendations.  5) Education on role of speech therapy.  6) Educated on POC and continuing speech therapy upon d/c.  Patient's husband and family verbalized understanding to all education.    Patient was left semi-reclined in bed with call light within reach and all needs met.  Safety handoff completed with RN/Michelle who verbalized understanding.    Time  Start Time: 1405  Stop Time: 1445  Time Calculation (min): 40 min    Charges   $Clinical Swallow: 1 Procedure  $Swallow Tx : 1 Procedure    Patient Class   Inpatient     Fara Boros, M.S. CCC/SLP   Speech Therapist  ASCOM phone: [phone]  03/09/2016

## 2016-03-09 NOTE — Unmapped (Signed)
PSYCHIATRY CONSULT, INITIAL EVALUATION      ASSESSMENT AND RECOMMENDATIONS: Brenda [name] is a 60 y.o. 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 Continue current meds for now, will contact pts outside psychiatrist  Labs :  - recommend ordering the following to further address organic etiologies : CB  C, RFP, LFTs, Albumin, Ammonia, TSH, FT4, RPR, HIV, Vit D, Vit B12, EKG    Monitoring required :EKG     Levels :        2. Sitter is needed due to fall  elopment risk delirium suicidal risk agitation    Medical hold is not Needed Due to lack of capacity to make decision to leave AMA. Medical hold is in order sets. Document lack of capacity with dotphrase medcap    3. Delirium  And Agitation Recs      Patient does have delirium   Patient does not have agitation requiring medical intervention     4. Substance Abuse/Dep/Withdrawal: no issues    5. SafetyRisk Assessment    Overall the risk: moderate for future dangerousness;   However the patient's imminent risk is low as the patient is ZOX:[id number] sober, future oriented, adherent with medication, has outpatient psychiatric follow-up established, has improved social support.    Plan for LOW risk: (ie pt has thoughts of death but NO imminent plan/intent OR behavior) pt has numbers to outpatient clinic, social supports and National Suicide Prevention Lifeline number (1-800-273-TALK-8255), or [facility] suicide hotline 802-357-0995), PES [phone] and Mobile Crisis Team [phone]. Pt is aware to contact provider(s), go to the PES and let friends/family know that he/she is feeling worse.     6. Disposition   Patient does not require inpatient admission:   Patient does not meet criteria for inpatient commitment : not in imminent risk for self harm, harm to others,  and is deemed able to care for self.    Patient does not a statement of belief     7.Followup :  Therapy, primary care,  Psychiatry,      See discharge plan for list of referral sources  Future Appointments  Date Time Provider Department Center   05/25/2016 11:30 AM Patrecia Pour, MD [facility]       Thank you for this consult, please call us with any further questions. We will be continue to follow at this time.    Brenda [name] Brenda Debbe Bales, MD, ______________________________________________________________________    Brenda [name] [id number]     Date/time of admission: 03/08/2016  9:51 PM      CC/Reason for Consult:  Psychogenic polydipsia     Session format : Face-to-Face      Brenda [name]  was cooperative and was able to give Partialinformation with chief complaint, history of present illness, past medical history, social history, past psychiatric history, review of systems  due to confusion and agitation. Therefore this information was largely obtained from family and medical record.      HPI: Brenda [name] is a 60 y.o. White or Caucasian female with a past medical history of  has a past medical history of Asthma; Environmental allergies; Superficial injury of face without infection (04/12/2009); Hematoma, left eyebrow (04/18/2009); Huntington's chorea (08/11/2010); Esophageal reflux (08/10/2010); Osteopenia (08/10/2010);  Fracture of left orbit (2010); Anxiety; Fatigue (05/24/2011); and Chronic sinusitis (05/24/2011). who presented to [facility] on 03/08/2016  with Hyponatremia    Psychiatry was consulted for  depression. And confusion  Onset of symptoms was gradual starting 1 year ago.  Symptoms are of moderate severity and are gradually worsening.  Patient states symptoms have been exacerbated by medical issues.  Symptoms are improved by support   Symptoms are associated with no coherent plan to harm self.  fatigue and feelings of worthlessness/guilt       Hospital Course :  Medications   clonazePAM (klonoPIN) tablet 3 mg (3 mg  Oral Given 03/09/16 2114)   risperiDONE (RISPERDAL) tablet 3 mg (3 mg Oral Given 03/09/16 2114)   pantoprazole (PROTONIX) EC tablet 40 mg (40 mg Oral Given 03/09/16 0539)   traZODone (DESYREL) tablet 100 mg (100 mg Oral Given 03/09/16 2114)   lithium [facility]) ER tablet 450 mg (450 mg Oral Given 03/09/16 2114)   heparin (porcine) injection 5,000 Units (5,000 Units Subcutaneous Given 03/09/16 2118)   mirabegron (MYRBETRIQ) 24 hr tablet Tb24 50 mg (0 mg Oral Hold 03/09/16 1548)   ondansetron (ZOFRAN) 4 mg/2 mL injection 4 mg (4 mg Intravenous Given 03/08/16 2219)   sodium chloride 0.9 % infusion (125 mL/hr Intravenous New Bag 03/08/16 2219)           Depression: .   Sleep: fair  Anhedonia: moderate  Appetite : decreased  Weight Changes: Weight Loss  Energy: decreased  Libido: unaffected  Anxiety/Panic:  moderate  Guilt: moderate   Hopeless:moderate  Self Injurious Behavior/Risky Behavior:  absent  Suicidal Ideation/ Homicidal Ideation no suicidal ideation, no homicidal ideation    Previous treatment includes medication.      Past Psychiatric History:    Prior hospitalizations:   Prior diagnoses:   Prior medication trials:    SSRI  .    side effects from the treatment: none.     Outpatient Treatment: medication      Suicide Attempts:    Substance Use History:none    Past Medical History:   Past Medical History   Diagnosis Date   ??? Asthma    ??? Environmental allergies    ??? Superficial injury of face without infection 04/12/2009   ??? Hematoma, left eyebrow 04/18/2009   ??? 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   ??? Esophageal reflux 08/10/2010   ??? Osteopenia 08/10/2010   ??? Fracture of left orbit 2010   ??? Anxiety    ??? Fatigue 05/24/2011   ??? Chronic sinusitis 05/24/2011     Past Surgical History   Procedure Laterality Date   ??? Sinus surgery     ??? Tonsillectomy         Social/Developmental History:      Social History     Social History   ??? Marital Status: Married     Spouse Name: N/A   ??? Number of Children: N/A   ??? 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 Smoking status: Never Smoker    ??? Smokeless tobacco: Never Used   ??? Alcohol Use: 0.6 oz/week     1 Cans of beer per week      Comment: Occasionally   ??? Drug Use: No   ??? Sexual Activity:     Partners: Male     Other Topics Concern   ??? Caffeine Use Yes   ???  Occupational Exposure No   ??? Exercise Yes   ??? Seat Belt Yes     Social History Narrative        Family History:   Family History   Problem Relation Age of Onset   ??? Pulmonary embolism Mother    ??? Huntington's disease Mother 34   ??? Alzheimer's disease Father    ??? Depression Sister    ??? Suicidality Sister      attemped suicide 2005   ??? Anxiety disorder Sister    ??? Huntington's disease Maternal Uncle      Allergies:  Allergies   Allergen Reactions   ??? Zithromax [Azithromycin] Anaphylaxis     Other reaction(s): Other (See Comments)  abdominal pain   ??? Bactrim [Sulfamethoxazole-Trimethoprim] Nausea Only and Hives   ??? Erythromycin Diarrhea   ??? Erythromycin Base Diarrhea and Nausea And Vomiting   ??? Methylphenidate Hives   ??? Sulfa (Sulfonamide Antibiotics) Hives       Home Medications:   Prior to Admission medications taking for visit date 03/08/16   Medication Sig Taking? Authorizing Provider   cholecalciferol, vitamin D3, 1000 units tablet Take 1,000 Units by mouth daily. Yes Historical Provider, MD   clonazePAM (KLONOPIN) 1 MG tablet 3 tablets (3 mg total) at bedtime.  Patient taking differently: Take 3 mg by mouth at bedtime.     Yes Louanna Raw, CNP   lithium (ESKALITH) 450 MG CR tablet Take 450 mg by mouth At bedtime. Yes Historical Provider, MD   mirabegron (MYBETRIQ) 50 mg Tb24 Take 50 mg by mouth daily. Indications: Bladder Hyperactivity Yes Historical Provider, MD   multivitamin [facility]) tablet Take 1 tablet by mouth daily.   Yes Historical Provider, MD   omeprazole 20 mg TbEC Take 40 mg by mouth daily. Yes Historical Provider, MD   risperiDONE (RISPERDAL) 1 MG tablet Take 3 mg by mouth at  bedtime. Yes Historical Provider, MD   traZODone (DESYREL) 100 MG tablet Take 1 tablet (100 mg total) by mouth at bedtime. Indications: SLEEP Yes Louanna Raw, CNP   omeprazole (PRILOSEC) 40 MG capsule Take 40 mg by mouth every morning before breakfast. Indications: Gastroesophageal Reflux  Historical Provider, MD   risperiDONE (RISPERDAL) 1 MG tablet 2.5 tablets (2.5 mg total) at bedtime.  Patient taking differently: Take 2.5 mg by mouth at bedtime.      Louanna Raw, CNP       Current Medications ordered:  Scheduled Meds:  ??? clonazePAM  3 mg Oral Nightly (2100)   ??? heparin (porcine)  5,000 Units Subcutaneous Q8H   ??? lithium  450 mg Oral Nightly (2100)   ??? mirabegron  50 mg Oral Daily 0900   ??? pantoprazole  40 mg Oral DAILY 0600   ??? risperiDONE  3 mg Oral Nightly (2100)   ??? traZODone  100 mg Oral Nightly (2100)     Continuous Infusions:   PRN Meds:.       ROS:   Constitutional weakness, fatigue  Skin:Denies: rash, itching, hives  HEENT:Denies: sore throat, nasal congestion, nasal discharge  Respiratory:cough, hemoptysis, shortness of breath, Denies: cough  Cardiac:Denies: chest pain  Musculoskeletal:back pain, joint pain, muscle pain  Neuro:{headacheinvoluntary movements  ZO:XWRUEA: abdominal pain, nausea, vomiting, diarrhea  GU:{Denies: dysuria, frequency/urgency, hematuria  Heme/ Denies: swollen lymph nodes, bleeding, bruising  Psych : see above  Comment:    OBJECTIVE:  Vitals:  Filed Vitals:    03/09/16 1400 03/09/16 1600 03/09/16 1800 03/09/16 2000   BP: 95/73 112/54 90/75 108/35  Pulse: 86 94 91 90   Temp:  98.6 ??F (37 ??C)  98.3 ??F (36.8 ??C)   TempSrc:  Oral  Oral   Resp: 16 24 15     Height:       Weight:       SpO2: 97% 97% 98% 98%       Physical Exam: reviewed that done by primary team     Mental Status Exam      General     Development :normal    Body Habitus: underweight    Grooming/Hygiene : clean     Demeanor: polite and cooperative    Eye Contact:  appropriate    Speech   Rate: Slow   Volume:  Quiet   Articulation:Normal   Quality: Paucity     Motor   Strength/Tone: spastic   Atrophy:present   Abnormal Movements: akathisia   Station:rigid/erect     Gait: needs assistance ( walker/wheelchair)      Mood/ Affect   Mood:  depressed    Affect - Range: constricted      - Reactivity: blunted      - Appropriateness: appropriate to mood and/or situation    Thought    Process: concrete   Content:  suspiciousnessDenied, Auditory, Visual and Tactile, has some obsessions    deniesdelusions and phobias       Physical and Psychological Reality Testing : normal    Cognitive   Level of Alertness: normal   Orientation to: time, place, person and situation    Recent Memory: mild impairment   Remote Memory: mild impairment   Attention/Concentration/Focus: mild impairment   Language: intact   Fund of Knowledge: intact    Safety   Harm to Self: passive thoughts without plan or active intent No specific plan to harm self  no means to carry out plan.     Harm to Others:none  Insight/ Judgement   Insight: fair   Judgment: fair        Laboratory Data       Complete Blood Count:  Recent Labs      03/08/16   2227  03/09/16   0407   WBC  13.1*  8.6   HGB  12.3  12.8   HCT  36.2  38.4   MCV  86.3  86.5   PLT  219  202       Basic Metabolic Panel:  Recent Labs      03/08/16   2227  03/09/16   0407  03/09/16   1250   NA  121*  140  138   K  3.8  4.5   --    CL  91*  105   --    CO2  24  29   --    BUN  10  10   --    MG  1.6   --    --    PHOS  2.1   --    --        Hepatic Panel:      Lab name 03/08/16  2227   AST 21   ALT 15   ALBUMIN 3.9           Drug Level:  Lab Results   Component Value Date    LITHIUM 0.5* 03/09/2016         URINE      Lab 03/08/16  2227   COLOR, URINE Colorless*   CLARITY Clear  PROTEIN UA Negative   PH UA 6.0   SPECIFIC GRAVITY, URINE <1.005*   GLUCOSE UA Negative   BLOOD UA Negative   LEUKOCYTES UA Negative   NITRITE UA Negative   BILIRUBIN UA Negative   UROBILINOGEN UA <2.0     Lab Results   Component Value  Date    COCAINSCRNUR Negative 03/08/2016    OPIATESCRNUR Negative 03/08/2016    THCSCRNUR Negative 03/08/2016    BENZOSCRNUR Negative 03/08/2016    BARBSCRNUR Negative 03/08/2016    METHADSCRNUR Negative 03/08/2016       PREGNANCY:     Delirium  No results found for: PREGTESTUR    No results found for: ESR, CRP    No results found for: [id number]                    Lab name 03/08/16  2227   ALBUMIN 3.9         Lab name 03/09/16  0407   TSH 3.14        Antipsychotic monitoring:          Other Labs:         Diagnostic Studies       Radiology:             No results found for this or any previous visit.    EKG:  No results found for this or any previous visit.    Chart REview, Collateral Contacted, Outside clinician contacted  Extensive records review has been done :  Outside record have been requested    Outside clinician hasbeen contacted    Collateral information has been obtained    Axis I:  Depressive Disorder secondary to general medical condition  Axis II: Deferred  Axis III:   Past Medical History   Diagnosis Date   ??? Asthma    ??? Environmental allergies    ??? Superficial injury of face without infection 04/12/2009   ??? Hematoma, left eyebrow 04/18/2009   ??? 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   ??? Esophageal reflux 08/10/2010   ??? Osteopenia 08/10/2010   ??? Fracture of left orbit 2010   ??? Anxiety    ??? Fatigue 05/24/2011   ??? Chronic sinusitis 05/24/2011      Axis IV: other psychosocial or environmental problems  Axis V: Global Assessment of Functioning (GAF) 61-70 mild symptoms    SafetyRisk Assessment  Factors inceasing risk of harm to self or others include :   Feelings of hopelessness and helplessness]       Factors decreasing risk of harm to self include : sobriety, support, outpatient followup,    Collateral information has been contacted. Yes  Patient has a good safety plan in place Yes   Patient denies no access to  firearms or other weapons     Signed:  Fabio Pierce, MD  03/09/2016, 10:40 PM

## 2016-03-09 NOTE — Unmapped (Signed)
Medication Reconciliation  [place] [facility]    Patient Name: Brenda [name] 1955-07-12    Medication reconciliation has been completed by: Pharmacist    Source(s) of information:  Patient, Sure Scripts, Med bottle, family member     Primary Care Physician: Dannielle Burn, MD    Pharmacy:   [id number] - [address]  [address]  [place] [address]  Phone: [phone]       Allergies   Allergen Reactions   ??? Zithromax [Azithromycin] Anaphylaxis     Other reaction(s): Other (See Comments)  abdominal pain   ??? Bactrim [Sulfamethoxazole-Trimethoprim] Nausea Only and Hives   ??? Erythromycin Diarrhea   ??? Erythromycin Base Diarrhea and Nausea And Vomiting   ??? Methylphenidate Hives   ??? Sulfa (Sulfonamide Antibiotics) Hives       Prior to Admission medications taking for visit date 03/08/16   Medication Sig Taking? Authorizing Provider   cholecalciferol, vitamin D3, 1000 units tablet Take 1,000 Units by mouth daily. Yes Historical Provider, MD   clonazePAM (KLONOPIN) 1 MG tablet 3 tablets (3 mg total) at bedtime.  Patient taking differently: Take 3 mg by mouth at bedtime.     Yes Louanna Raw, CNP   lithium (ESKALITH) 450 MG CR tablet Take 450 mg by mouth At bedtime. Yes Historical Provider, MD   mirabegron (MYBETRIQ) 50 mg Tb24 Take 50 mg by mouth daily. Indications: Bladder Hyperactivity Yes Historical Provider, MD   multivitamin [facility]) tablet Take 1 tablet by mouth daily.   Yes Historical Provider, MD   omeprazole 20 mg TbEC Take 40 mg by mouth daily. Yes Historical Provider, MD   risperiDONE (RISPERDAL) 1 MG tablet Take 3 mg by mouth at bedtime. Yes Historical Provider, MD   traZODone (DESYREL) 100 MG tablet Take 1 tablet (100 mg total) by mouth at bedtime. Indications: SLEEP Yes Louanna Raw, CNP         Medications flagged for removal (include reason, ex. noncompliance, medication cost, therapy complete etc.):    ?? Omeprazole - dose adjust  ?? Risperidone - dose adjust    Other notes  (antibiotics, medication pumps, insulin, patches, cream/ointments, tapers, warfarin etc.):         To my knowledge the above medication reconciliation is accurate as of 03/09/2016 12:55 PM.        Dorathy Daft P Georgi Navarrete, Pharm.D.   03/09/2016 12:55 PM  [phone]

## 2016-03-09 NOTE — Unmapped (Signed)
Nephrology Consult Dictated [phone] Thanks for calling;suspect psychogenic polydipsia-she talks of drinking a gallon of water but is a poor historian.The rapid improvement in her Na with IV saline argues against SIADH.Trazodone can cause hyponatremia but probably not the cause as Na has improved on it.Will check TSH;recheck Na this afternoon and in am.

## 2016-03-09 NOTE — Unmapped (Signed)
[facility]    Notes reviewed, pt seen and examined.  Alert, conversant, no complaints.  Na jumped 19 in 6h?  Raises question of lab error on initial test.   Has only received a little IVF.  Will get PT/OT/SLP evals.          Xariah Silvernail, DO, [facility]  03/09/2016 11:45 AM

## 2016-03-09 NOTE — Unmapped (Signed)
Patient admitted to room 229 at 0110. Patient arrived via stretcher and was observed as anxious, asking for water, ambulates to the bathroom. Pt placed on monitor Admission vital signs  in flow sheet. Nursing assessment in progress. Head of bed is elevated, call light in reach.

## 2016-03-09 NOTE — Unmapped (Signed)
[facility]  Case Management/Social Work Department  Discharge Planning Assessment    Patient Information     Current Mental Status: Alert and oriented   Patient lives with: Spouse  Type of Home: One level  Number of Steps: none  Level of Activity Prior to Admission: needs assistance            Current Level of Activity: PT/OT/ST  DME Available at Home:   PCP: Dannielle Burn, MD  Home Pharmacy:          [facility] [address]  [address]  [place] [address]  Phone: [phone]     [id number] [address]  [address]  [place] [address]  Phone: [phone]     [facility] [address])  [address]  [place] [address]  Phone: [phone]     [facility] TO REGISTERED [facility]  [address]  [place] [address]  Phone: [phone]     [phone]  [address]  [place] [address]  Phone: [phone]             Issues related to obtaining prescriptions: NA  Coumadin follow up: NA  Transportation at discharge: Family    Support Systems     Contact person/Caregiver:        Contact Person: [name]       Phone: [phone]       Relationship to the patient: Spouse       Permission to contact:  yes  Name of POA/Guardian: na       Verified: na  24/7 Supervision/Assistance Available at D/C if needed: yes  Barriers/Significant Issues that may affect discharge or follow up care: Disease process    Therapist, nutritional (Current/Prior): NA  Home Health/Home Infusion (Current/Prior): NA  Non-Skilled Services (Current/Prior): [facility] 3 x week  Oxygen/Bipap/CPAP/Hand [employer] Prior to admission: NA  Outpatient Dialysis Services: NA    Other Pertinent Information     Patient admitted to Step Down for Hyponatremia. Patient is on Medical Hold pending Psych consult. SW aware    Discharge Plan     Met with patient to  initiate discussion regarding discharge planning. Introduced self and role of case management/social work and provided Tour manager.    Patient spouse would like to place patient in an inpatient Psych unit versus home with Home health. Should Psych not feel patient warrants inpatient Psych Spouse obtained a list of HHC from insurance that he will call or bring in for CM to make referral.     CM/SW will continue to follow and remain available for continued discharge planning.    Patient/Family aware and taking part in the discharge plan.  Patient/family were offered a post-acute provider list as applicable to the discharge plan and insurance provider.  Patient/family were given the freedom to choose providers and financial interest(s) were disclosed as appropriate.        Jacqlyn Krauss RN, [facility]  Case Manager  [phone] Ascom  [phone] Fax

## 2016-03-09 NOTE — H&P (Signed)
[place]                              [facility]     PATIENT NAME:   Brenda [name], Brenda [name]                  MRN: [id number]  DATE OF BIRTH:  1956-01-20                     CSN: [id number]  ADMITTING:      Raleigh Nation, M.D.           ADMIT DATE: 03/08/2016  DICTATED BY:    Raleigh Nation, M.D.                                  HISTORY AND PHYSICAL     REASON FOR ADMISSION: Altered mental status.     HISTORY OF PRESENT ILLNESS: The patient is a 60 year old woman with multiple  medical issues including Huntington's chorea.  It appears from the outpatient  records that her psychiatric component of the Huntington's may be  progressively worsening over time.  She was brought into the emergency room  because of mental status changes.  Apparently she may have taken her extra  doses of clonazepam and trazodone as well as alcohol ingestion.  She  complained of nausea.  She denied any suicidal ideation.  In the emergency  department she was found to have a blood pressure of 106/53.  She was  afebrile at 98.  Her sodium was 121.  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  She was placed on a medical hold in the emergency  department.     ALLERGIES:  She has known allergies to:  1. Zithromax.  2. Bactrim.  3. Erythromycin.  4. Methylphenidate.  5. Sulfa.     MEDICATIONS: At home--  1. Vitamin D3 daily.  2. Klonopin 3 mg at bedtime.  3. Lithium 450 mg at bedtime.  4. Myrbetriq 50 mg daily.  5. Theragran 1 tablet daily.  6. Prilosec 40 mg daily.  7. Risperdal 2.5 mg at bedtime.  8. Trazodone 100 mg at bedtime.     PAST MEDICAL HISTORY:  1. Asthma.  2. Environmental allergies.  3. Huntington's chorea.  4. Esophageal reflux.  5. Osteopenia.  6. Anxiety.  7. Fatigue.  8. Chronic sinusitis.     PAST SURGICAL HISTORY:  1. Status post sinus surgery.  2. Status post tonsillectomy.     SOCIAL HISTORY:  Has never been a smoker.  No alcohol.  No IV drugs.     FAMILY HISTORY:   Significant for Huntington's disease, depression and  suicide.     REVIEW OF SYSTEMS: The patient is not able to participate in the review of  systems at this time.     PHYSICAL EXAMINATION:  VITAL SIGNS: She is afebrile at 98.8, blood pressure 91/59, heart rate 68.  GENERAL: She is resting comfortably.  She is in no acute distress.  HEENT:  No oral lesions.  No thrush.  NECK: Supple.  No JVD.  PULMONARY: Chest is clear; no rales, rhonchi or wheezing appreciated.  HEART:  Normal S1, S2.  No murmurs.  ABDOMEN: Soft, no tenderness.  Positive bowel sounds.  EXTREMITIES: No edema.     LABORATORY DATA: She had a lithium level that  was 0.5.  Sodium 121 with  potassium of 3.8, chloride 91 with bicarbonate of 24, BUN 10, creatinine 0.79  and glucose 106.  White blood cell count 13.1 with a hemoglobin of 12.3,  hematocrit of 36.2 and platelets 219.  Total bilirubin 0.1.  AST 21, ALT 15.  Lactic acid 0.7.  Magnesium 1.6 with a phosphorus of 2.1.     ASSESSMENT AND PLAN: The patient is a 60 year old woman with Huntington's  chorea which seems to be progressing--her psychiatric symptoms seem to be  progressing.  She now presents with severe hyponatremia, suspicious of  psychogenic polydipsia.  However, urine studies were not sent and I will do  that now.     Hyponatremia.  1. Will send urine osms.  2. Will monitor closely.  3. Will ask Renal to see.     Huntington's chorea.  Will ask psychiatrist to see regarding worsening  psychiatric symptoms.                                                Raleigh Nation, M.D.  SP/ja  D:  03/09/2016 05:13  T:  03/09/2016 06:26  Job #:  [id number]         I certify that this patient requires inpatient services with an estimated LOS > 2 midnights due to the following co-morbidities huntingtons chorea. .  Severity of signs and symptoms assessed as mental status changes.  Patient's current medical needs include management of hyponatremia.  Risk of adverse events including worsening mental status and  seizures anticipated.   Total LOS estimated to be >2 days    HISTORY AND PHYSICAL                                         PAGE    1 of   1

## 2016-03-09 NOTE — Unmapped (Signed)
Frequent urination, Foley catheter 53fr placed under aseptic technique at 0148.

## 2016-03-10 LAB — BASIC METABOLIC PANEL
Anion Gap: 2 mmol/L (ref 3–16)
BUN: 16 mg/dL (ref 7–25)
CO2: 25 mmol/L (ref 21–33)
Calcium: 9.2 mg/dL (ref 8.6–10.3)
Chloride: 108 mmol/L (ref 98–110)
Creatinine: 0.96 mg/dL (ref 0.60–1.30)
Glucose: 103 mg/dL (ref 70–100)
Osmolality, Calculated: 281 mOsm/kg (ref 278–305)
Potassium: 3.9 mmol/L (ref 3.5–5.3)
Sodium: 135 mmol/L (ref 133–146)
eGFR AA CKD-EPI: 75 See note.
eGFR NONAA CKD-EPI: 65 See note.

## 2016-03-10 LAB — PHOSPHORUS: Phosphorus: 3.2 mg/dL (ref 2.1–4.7)

## 2016-03-10 LAB — MAGNESIUM: Magnesium: 1.9 mg/dL (ref 1.5–2.5)

## 2016-03-10 MED ORDER — risperiDONE (RISPERDAL) tablet 3 mg
2 | Freq: Every evening | ORAL | Status: AC
Start: 2016-03-10 — End: 2016-03-14
  Administered 2016-03-11 – 2016-03-14 (×4): 3 mg via ORAL

## 2016-03-10 MED FILL — HEPARIN (PORCINE) 5,000 UNIT/ML INJECTION SOLUTION: 5000 5,000 unit/mL | INTRAMUSCULAR | Qty: 1

## 2016-03-10 MED FILL — LITHIUM CARBONATE ER 450 MG TABLET,EXTENDED RELEASE: 450 450 MG | ORAL | Qty: 1

## 2016-03-10 MED FILL — CLONAZEPAM 1 MG TABLET: 1 1 MG | ORAL | Qty: 3

## 2016-03-10 MED FILL — RISPERIDONE 1 MG TABLET: 1 1 MG | ORAL | Qty: 1

## 2016-03-10 MED FILL — PANTOPRAZOLE 40 MG TABLET,DELAYED RELEASE: 40 40 MG | ORAL | Qty: 1

## 2016-03-10 MED FILL — TRAZODONE 100 MG TABLET: 100 100 MG | ORAL | Qty: 1

## 2016-03-10 NOTE — Unmapped (Signed)
Pt asleep most of night. Woke up c/o indigestion. HOB increased and protonix given, pt has since fallen back asleep.

## 2016-03-10 NOTE — Unmapped (Signed)
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 White or Caucasian female ho presented to [facility] with Huntington chorea G10.  Pt. with mental status changes and hyponatremia.    Assessment  OT 6 Clicks  Help From Another Person To Put On/Take Off Regular Lower Body Clothing: A lot  Help From Another Person Bathing ( including washing ,rinsing,drying): A lot  Help From Another Person Toileting, which includes using the toilet, bedpan or urinal: A lot  Help From Another Person To Put On/Take Off Regular Upper Body Clothing: A lot  Help From Another Person Taking Care Of Personal Grooming: A little  Help From Another Person Eating Meals: A little  OT 6 Clicks Score: 14  Assessment: Decreased activity tolerance, Decreased ADL status, Decreased high-level ADLs, Decreased Functional Mobility, Decreased Balance, Decreased self-care trans, Decreased cognition, Decreased Safe judgement during ADL, Decreased fine motor control, Decreased UE strength, Decreased UE ROM  Prognosis: Good  Goal Formulation: Patient    Goals  Pt Will demonstrate supine to sit to prep for ADLs: Stand by assist  Pt Will demonstrate functional chair transfer: Stand by assist  Pt Will demonstrate toilet transfer: Contact Guard  Pt Will complete toileting with: Minimal  Pt Will demonstrate feeding task: Stand by assist  Pt Will demonstrate grooming task: Contact Guard  Pt  Will demonstrate UE ADLs: Minimal  Pt Will demonstrate LE ADLs: Minimal  Time frame for goals to be met in: 7 days 03/17/16    Recommendation  Plan  Treatment Interventions: ADL retraining, Lower Extremity Intervention, Therapeutic Activity,  Activity Tolerance training, Cognitive reorientation, Functional transfer training, Energy Conservation, Patient/Family training, Equipment eval/education, Compensatory technique education  Progress: Improving as expected  OT Frequency: 3-5x/wk  Recommendation  Recommendation: Short-term skilled OT  Equipment Recommended: Defer at this time     Problem List  Patient Active Problem List   Diagnosis   ??? Huntington's chorea   ??? Asthma   ??? Esophageal reflux   ??? Osteopenia   ??? Fatigue   ??? Chronic sinusitis   ??? Anxiety   ??? Depression   ??? Paranoia   ??? Care involving speech-language therapy   ??? Hoarseness of voice   ??? Laryngopharyngeal reflux (LPR)   ??? Cough   ??? Allergic rhinitis   ??? GERD (gastroesophageal reflux disease)   ??? Anxiety and depression   ??? Huntington's disease   ??? Dysphagia   ??? Needs a medical hold   ??? Hyponatremia        Past Medical History  Past Medical History   Diagnosis Date   ??? Asthma    ??? Environmental allergies    ??? Superficial injury of face without infection 04/12/2009   ??? Hematoma, left eyebrow 04/18/2009   ??? 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   ??? Esophageal reflux 08/10/2010   ??? Osteopenia 08/10/2010   ??? Fracture of left orbit 2010   ??? Anxiety    ??? Fatigue 05/24/2011   ??? Chronic sinusitis 05/24/2011        Past Surgical History  Past Surgical History   Procedure Laterality Date   ??? Sinus surgery     ??? Tonsillectomy  Patient Stated Goals  Goal #1: get stronger and return home      Home Living/Prior Function  Type of Home: House  Home Layout: Performs ADL's on one level  Bathroom Shower/Tub: Walk-in shower (Pt. also has a tub)  Firefighter: Midwife: Psychologist, prison and probation services Accessibility: Accessible  Home Equipment: Water quality scientist;Wheelchair-manual  Additional Comments: Pt. has an aide that comes to assist with ADLs 3x/week  Prior Function  Level of Independence:  (Pt. usually does not walk with an assistive device)  Lives With: Spouse (Pt. states her spouse works  full time, has family that comes and assists with  patient as able)  ADL Assistance:  (Pt. states her aide assists with bathing and dressing)  Homemaking/IADL Assistance: Needs assistance  Comments: On Mondays, pt. goes with a prayer group in the morning, goes out in the community in Fridays     Pain  Pain Score: 0-No pain  Therapist reported pain to:: RN monitoring     Vision  Current Vision:  (Pt. not wearing glasses at time of eval, hearing intact)    Cognition  Overall Cognitive Status: Impaired  Arousal/Alertness: Delayed responses to stimuli  Attention Span: Attends with cues to redirect  Memory: Decreased recall of biographical information  Orientation Level: Oriented to place;Oriented to person  Safety Judgment: Decreased awareness of need for safety    Sensation  Light Touch: No apparent deficits (in bilateral UEs)    Proprioception  Proprioception  Proprioception: Partial deficits in the RUE;Partial deficits in the LUE    Perception  Perception  Inattention/Neglect: Appears intact  Initiation: Cues to initiate tasks  Motor Planning: Cues to use objects appropriately  Perseveration: Perseverates during ADLs    Right Upper Extremity   RUE Assessment:  (ROM intact, pt. with Huntington's Chorea, difficult to assess Muscle strength)         Left Upper Extremity  LUE Assessment:  (ROM intact, MMT difficult to assess secondary to pt. with history of Huntington's Chorea)    LUE Strength  LUE Overall Strength: Unable to assess    Hand Function  Gross Grasp: Impaired  Coordination: Impaired (Bilaterally)        Functional Mobility  Bed Mobility Eval  Rolling: Contact Guard  Supine to Sit: Minimal  Sit to Supine:  (NT secondary to pt. up in chair at end of session)  Transfers Eval  Sit to Stand: Moderate  Bed to Chair: Minimal (Bed to chair with rolling walker)  Lateral Transfers: Minimal assistance  Functional Mobility: Minimal (with rolling walker, cues to hold on to walker)  Balance Eval  Sitting - Static: Contact  Guard Assistance  Sitting-Dynamic: Minimal Assistance   Standing-Static: Minimal Assistance  Standing-Dynamic: Moderate Assistance (secondary to decreased safety awareness)    ADL  Where Assessed: Chair  Eating Assistance: Minimal (food spillage observed)  Grooming Assistance: Minimal  Grooming Deficit: Wash/dry face;Supervision/safety;Increased time to complete;Brushing hair  LE Dressing Assistance: Moderate  LE Dressing Deficit: Don/doff R sock;Don/doff L sock  Toileting Assistance with Device:  (foley cath in place)    Patient Education  Pt. Educated on role of OT/POC, functional transfers, and safety with ADLs/IADLs.  Pt. Demo fair understanding of education.    Pt. Sitting in chair with needs in reach and chair alarm activated.    Handoff of Care:  Safety Handoff completed with RN after Rehabilitation session.          Time  Start Time: 1200  Stop Time: 1235  Time Calculation (min): 35 min    Charges  $OT Evaluation Mod Complex 45 Min: 1 Procedure      97166- Moderate Complexity Evaluation  This evaluation code was determined based on analysis of pt's occupational profile, performance deficits, and level of clinical decision-making to complete OT evaluation        **Occupational Profile: mod Complexity  Expanded chart review      **Performance deficits: mod Complexity  3-5 performance deficits      **Clinical Decision-making: mod Complexity     Co-morbidities affecting pt's current performance: See above for PMH    Current level of physical/ verbal assistance:   Mod physical/verbal assistance        Darene Lamer, OTR/L   License # [id number]  Ascom:  [phone]  03/10/2016

## 2016-03-10 NOTE — Unmapped (Signed)
Patient took over the care of this patient at 2300. Patient has been sleeping throughout the night. When she woke up she stated she needed Ativan, Trazodone, or something for a headache because she needs to sleep because she has been up all night. RN redirected the patient. Patient ambulated to the restroom, gait is very unsteady. She is now resting with her eyes closed. Patient bed alarm activated and in lowest position. Patient educated on use of call light prior to getting up. Patient verbalized understanding.

## 2016-03-10 NOTE — Unmapped (Signed)
[facility]                                                          [address]                                                               [address]                                                       [place], [place] [address]                                                Phone Number: [phone]                                                  Fax Number: [phone]                                          Nephrology Attending Progress Note      SUBJECTIVE:  We are following this patient for hyponatremia.  The patient has no complaints;Na remains normal at 135    Physical Exam:    TEMPERATURE:  Current - Temp: 98.7 ??F (37.1 ??C); Max - Temp  Avg: 98.5 ??F (36.9 ??C)  Min: 98.3 ??F (36.8 ??C)  Max: 98.7 ??F (37.1 ??C)  RESPIRATIONS RANGE: Resp  Avg: 18.6  Min: 15  Max: 24  PULSE RANGE: Pulse  Avg: 83.9  Min: 69  Max: 94  BLOOD PRESSURE RANGE:  Systolic (24hrs), Avg:100 mmHg, Min:88 mmHg, Max:112 mmHg  ; Diastolic (24hrs), Avg:62 mmHg, Min:35 mmHg, Max:85 mmHg    24HR INTAKE/OUTPUT:    Intake/Output Summary (Last 24 hours) at 03/10/16 1053  Last data filed at 03/10/16 0913   Gross per 24 hour   Intake   1110 ml   Output    695 ml   Net    415 ml       General appearance: alert, appears stated age and cooperative  Head: Normocephalic, atraumatic  Eyes: PER, EOM's intact.   Nose: Nares normal.  No drainage  Neck: No JVD  Lungs: clear to auscultation bilaterally  Heart: regular rate and rhythm, no rub  Abdomen: soft, non-tender;   Extremities: no edema  Skin: Skin color, texture, turgor normal. No rashes or lesions  Neurologic: Grossly normal         LAB DATA:    CBC:   Recent Labs      03/08/16   2227  03/09/16  0407   WBC  13.1*  8.6   HGB  12.3  12.8   HCT  36.2  38.4   PLT  219  202      BMP:  Recent Labs      03/08/16   2227  03/09/16   0407  03/09/16   1250  03/10/16   0403   NA  121*  140  138  135   K  3.8  4.5   --   3.9   CL  91*  105    --   108   CO2  24  29   --   25   BUN  10  10   --   16   CREATININE  0.79  0.85   --   0.96     Lab Results   Component Value Date    CALCIUM 9.2 03/10/2016    PHOS 3.2 03/10/2016     Magnesium:    Lab Results   Component Value Date    MG 1.9 03/10/2016     U/A:  Lab Results   Component Value Date    NITRITE Negative 03/08/2016    COLORU Colorless* 03/08/2016    PHUR 6.0 03/08/2016    CLARITYU Clear 03/08/2016    LEUKOCYTESUR Negative 03/08/2016    UROBILINOGEN <2.0 03/08/2016    BILIRUBINUR Negative 03/08/2016    BLOODU Negative 03/08/2016    GLUCOSEU Negative 03/08/2016    KETONESU Negative 03/08/2016       No results found for: NAUR, KUR, CLUR    No results found for: Concepcion Elk          IMPRESSION/RECOMMENDATIONS:      Principal Problem:    Hyponatremia Suspect it was due to excessive water intake;now resolved;TSH normal at 3.14;will sign off.Thanks                      SIGNED BY: Zella Ball Reigan Tolliver,MD

## 2016-03-10 NOTE — Unmapped (Signed)
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 White or Caucasian female ho presented to [facility] with Huntington chorea G10.  Pt. with mental status changes and hyponatremia.  Assessment  PT 6 Clicks  Help From Another Person Turning From Back to Side While Flat in Bed Without Using Siderails: A little  Help From Another Person Moving From Lying On Back To Sitting Without Using Siderails: A little  Help From Another Person Moving To And From Bed To Chair: A little  Help From Another Person Standing Up From Chair Using Your Arms: A little  Help From Another Person To Walk In Hospital Room: A lot  Help From Another Person Climbing 3-5 Steps With A Railing: A lot  PT 6 Clicks Score: 16  Assessment: Impaired Bed Mobility, Impaired Transfer Mobility, Impaired Ambulation, Impaired Balance, Impaired Strength, Impaired ROM, Impaired Safety Awareness, Deconditioning  Prognosis: Good  Goals  Pt Will Go Supine To Sit: Supervision  Sit To Stand: Supervision with RW  Pt Will Transfer Bed/Chair: Supervision (with RW)  Pt Will Ambulate: Supervision (with RW with 250')  Miscellaneous Goal #1: Patient to participate in LE HEP and balance activities to improve overall functional mobility.   Time frame for goals to be met in: 7 days (03/17/16)   Recommendation  Plan  Treatment/Interventions: LE strengthening/ROM, Neuromuscular Reeducation, Endurance training, Patient/family training, Therapeutic Activity, Equipment eval/education, Therapeutic Exercise, Gait training, Compensatory technique education, Continued evaluation, Stair Training  PT Frequency:  3-5x/wk    Recommendation  Recommendation: Short-term skilled PT  Problem List  Patient Active Problem List   Diagnosis   ??? Huntington's chorea   ??? Asthma   ??? Esophageal reflux   ??? Osteopenia   ??? Fatigue   ??? Chronic sinusitis   ??? Anxiety   ??? Depression   ??? Paranoia   ??? Care involving speech-language therapy   ??? Hoarseness of voice   ??? Laryngopharyngeal reflux (LPR)   ??? Cough   ??? Allergic rhinitis   ??? GERD (gastroesophageal reflux disease)   ??? Anxiety and depression   ??? Huntington's disease   ??? Dysphagia   ??? Needs a medical hold   ??? Hyponatremia      Past Medical History  Past Medical History   Diagnosis Date   ??? Asthma    ??? Environmental allergies    ??? Superficial injury of face without infection 04/12/2009   ??? Hematoma, left eyebrow 04/18/2009   ??? 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   ??? Esophageal reflux 08/10/2010   ??? Osteopenia 08/10/2010   ??? Fracture of left orbit 2010   ??? Anxiety    ??? Fatigue 05/24/2011   ??? Chronic sinusitis 05/24/2011      Past Surgical History  Past Surgical History   Procedure Laterality Date   ??? Sinus surgery     ??? Tonsillectomy       Patient Stated Goals  Goal #1: To get stronger.   Home Living/Prior Function  Type of Home: House  Home Layout: Performs ADL's on one level  Bathroom Shower/Tub: Walk-in shower (Pt. also has a tub)  Bathroom Toilet: Midwife: Built-in shower seat  Bathroom Accessibility: Accessible  Home Equipment: Cane;Rolling Walker;Wheelchair-manual  Additional Comments: Pt. has an aide that comes to assist with ADLs 3x/week  Level of Independence:  (Pt. usually does not walk with an assistive device)  Lives With: Spouse (Pt. states her spouse works full time, has family that comes and assists with  patient as able)  ADL Assistance:  (Pt. states her aide assists with bathing and dressing)  Homemaking/IADL Assistance: Needs assistance  Comments: On Mondays, pt. goes with a prayer group in the morning, goes out in the community in Fridays     Pain  Pain Score: 0-No  pain    Vision  Current Vision:  (Pt. not wearing glasses at time of eval, hearing intact)    Cognition  Arousal/Alertness: Delayed responses to stimuli  Orientation Level: Oriented to place;Oriented to person  Safety Judgment: Decreased awareness of need for safety    Sensation  Light Touch: No apparent deficits  Sharp/Dull: No apparent deficits  Proprioception: Partial deficits in the RLE;Partial deficits in the LLE;Partial deficits in the RUE;Partial deficits in the LUE  Inattention/Neglect: Appears intact  Initiation: Cues to initiate tasks  Motor Planning: Cues to use objects appropriately  Perseveration: Perseverates during ADLs    Upper Extremity  RUE Assessment:  (Defer OT)     LUE Assessment:  (Defer OT)  LUE Strength  LUE Overall Strength: Unable to assess  Lower Extremity  RLE Assessment  RLE Assessment: Within Functional Limits  LLE Assessment  LLE Assessment: Within Functional Limits  Functional Mobility  Bed Mobility Eval  Rolling: Contact Guard  Supine to Sit: Min assist to left (impulsive, cues for safety )  Sit to Supine:  (NT-Up in chair upon leaving with alarm in place)  Transfers Eval  Sit to Stand: Moderate (to RW, cues for safety, cues for hand placement)  Bed to Chair: Minimal (with RW, cues for safety, impulsive)  Lateral Transfers: Minimal assistance  Gait Eval  Pattern Eval : Shuffle (impulsive, cues to stay close to RW)  Gait Assistance Eval: Minimal  Assistive Device Eval: Rolling walker  Distance Eval: 15'  Balance Eval  Sitting - Static: Contact Guard Assistance  Sitting-Dynamic: Minimal Assistance   Standing-Static: Minimal Assistance  Standing-Dynamic: Moderate Assistance    Treatment: Provided 10 minutes of Physical Therapy treatment in addition to the above Evaluation/Assessment.  Treatment provided includes the following:    Patient Education: Role of PT, POC, call for assist, safety     Therapeutic Activity:  PT performed transfer training with pt for sequencing, safety and hand  placement with utilization of RW. Patient educated on pacing activity, safety with transitioning OOB.     Position After Physical Therapy session:  Up in chair  Call light and phone / communication device placed within patient's reach.    Handoff of Care:  Safety Handoff completed with RN, Marcelino Duster after Rehabilitation session.    Time  Start Time: 1156  Stop Time: 1235  Time Calculation (min): 39 min    Charges   $PT Evaluation Mod Complex 30 Min: 1 Procedure  $Therapeutic Activity: 1 unit      Herby Abraham, PT, DPT # [phone]  03/10/2016 3:28 PM

## 2016-03-10 NOTE — Unmapped (Signed)
Problem: PHASE 3  Patients who are in an acute/subacute phase with multiple medical problems, able to actively participate with mobility, still weak but able to tolerate increased activity levels.  Goal: Continue Mobilization of patient, progress to Phase 4  Progressing:    Patient is able to move leg against gravity, follows commands, and actively participates in activities  Intervention: Educate/instruct pt and/or significant other/family  On importance of progressive mobilization  .  Intervention: Educate/instruct pt and/or significant other/family  On safety issues during transfers and walking  .  Intervention: Assess for self-turning/repositioning  .Marland Kitchen  Intervention: Sitting fully upright for 20 minutes  3 times daily  .  Intervention: Transfer training on use of walker  And/or assistance to bedside chair/commode  .  Intervention: Incentive spirometer TID  .  Intervention: Mobility sessions 30-60 minutes BID  .  Intervention: Maintain oxygenation greater than 88%  .

## 2016-03-10 NOTE — Unmapped (Signed)
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  SLP will continue to follow up as schedule allows.    Tijana Walder MACCC/SLP  03/10/2016  Ascom #[phone]

## 2016-03-10 NOTE — Unmapped (Signed)
Please let me know if you need me to do anything with this message.

## 2016-03-10 NOTE — Unmapped (Signed)
Husband requesting to speak with Dr Gerilyn Pilgrim as soon as possible. States the pt has been in the hospital at [facility] for the past 2 days with low sodium and disorientation. He has not spoken to the Dr,  pt is being released but still has a f/c in and has not walked in 2 days. He is not sure who the pt spoke with earlier and does not feel she is ready to be released. Please advise.

## 2016-03-10 NOTE — Unmapped (Signed)
Patient is alert. Patient is oriented to self, family, place. Patient denies pain, denies nausea, denies dizziness and denies sob at this time. Pt's family is in the room. VSS. Non tele. Resting in bed, eating dinner. Pt is transferred to our unit from stepdown with dx of hyponatremia. Pt is stable at this time, assessments are completed, denies any needs, will monitor patient. Bed alarm is on and Garfield Cornea, RN

## 2016-03-10 NOTE — Unmapped (Signed)
Spoke to patient's husband and case manager seeing her in the hospital.

## 2016-03-10 NOTE — Unmapped (Signed)
CASE MANAGEMENT NOTE    Met with patient's spouse. Lengthy conversation of 30 minutes.  Discussed both short term and long term needs. Spouse requests referral to [facility], done via [facility].    Discharge plan: Pending.     Will continue to monitor and assist with CM and discharge needs.     Jacklynn Lewis, RN, BSN  CASE MANAGER

## 2016-03-10 NOTE — Unmapped (Signed)
[facility] HOSPITALIST PROGRESS NOTE    Name: Brenda [name]    DOB: December 22, 1955   MRN: [id number]   Admit Date: 03/08/2016       Patient class : Inpatient    Assessment & Plan:    -  Gen weakness, altered mental status, d/t progression of Huntington dz, exacerbated by hyponatremia.  PT/OT evals in progress  -  Hyponatremia d/t meds, polydipsia, impulsive behavior from HD.  Corrected today, renal s/o.  Recheck in am  -  Oropharyngeal dysphagia d/t HD; SLP recs noted    D/w Dr Gerilyn Pilgrim on phone, husband at bedside.  She is not safe to be at home in her current state, needs supervised environment.  He has investigated options in past, will be pursuing them in the meantime.  Poss d/c tomorrow if safe dispo arranged (SNF vs IPR).  Partners to assume care in am.       Subjective:      Seems calmer today.  C/o coughing after po intake, no SOB/F/C/S.      Physical Exam:    BP 91/64 mmHg   Pulse 69   Temp(Src) 98.7 ??F (37.1 ??C) (Axillary)   Resp 20   Ht 5' 5 (1.651 m)   Wt 147 lb 3.2 oz (66.769 kg)   BMI 24.50 kg/m2   SpO2 97%   LMP 05/20/2011       GEN: NAD  HEENT: EOMI; no icterus or injection; mucus membranes moist  CV: RRR; no murmurs  PULM: CTAB; nonlabored  GI: normoactive bowel sounds; soft; nontender; nondistended  EXT: no edema    Labs/ Imaging:    Reviewed      Ezzard Standing, DO, SFHM  Pager [phone]  03/10/2016, 12:26 PM

## 2016-03-11 LAB — BASIC METABOLIC PANEL
Anion Gap: 6 mmol/L (ref 3–16)
BUN: 16 mg/dL (ref 7–25)
CO2: 28 mmol/L (ref 21–33)
Calcium: 9.2 mg/dL (ref 8.6–10.3)
Chloride: 105 mmol/L (ref 98–110)
Creatinine: 0.81 mg/dL (ref 0.60–1.30)
Glucose: 100 mg/dL (ref 70–100)
Osmolality, Calculated: 289 mOsm/kg (ref 278–305)
Potassium: 4.1 mmol/L (ref 3.5–5.3)
Sodium: 139 mmol/L (ref 133–146)
eGFR AA CKD-EPI: >90 See note.
eGFR NONAA CKD-EPI: 79 See note.

## 2016-03-11 MED FILL — LITHIUM CARBONATE ER 450 MG TABLET,EXTENDED RELEASE: 450 450 MG | ORAL | Qty: 1

## 2016-03-11 MED FILL — RISPERIDONE 1 MG TABLET: 1 1 MG | ORAL | Qty: 1

## 2016-03-11 MED FILL — CLONAZEPAM 1 MG TABLET: 1 1 MG | ORAL | Qty: 3

## 2016-03-11 MED FILL — TRAZODONE 100 MG TABLET: 100 100 MG | ORAL | Qty: 1

## 2016-03-11 MED FILL — HEPARIN (PORCINE) 5,000 UNIT/ML INJECTION SOLUTION: 5000 5,000 unit/mL | INTRAMUSCULAR | Qty: 1

## 2016-03-11 MED FILL — PANTOPRAZOLE 40 MG TABLET,DELAYED RELEASE: 40 40 MG | ORAL | Qty: 1

## 2016-03-11 NOTE — Unmapped (Signed)
Problem: Swallowing Difficulty (NC-1.1)  Impaired or difficult movement of food and liquid within the oral cavity to the stomach.  Related to: dysphagia ??  As evidenced by: pt requiring honey thick, mechanical soft , with supervision with meals   Goal: Food and/or nutrient delivery  Individualized approach for food/nutrient provision.  1. Pt will consume > 75% x 3 meals per day by f/u  2. Pt will comply with mechanical soft, Honey thick diet by f/u  3. Pt to continue to follow SLP recommendations by f/u

## 2016-03-11 NOTE — Unmapped (Signed)
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  Husband present, but left for session.  Pt sitting up in chair.  Pt reported multiple times that she did not want to go to a nursing home.  Pt currently on mech soft diet with honey thick liquids.  No report of pain.    Objective   Goals Short Term Dysphagia  Patient will demonstrate use of swallowing exercises: by 03/16/16 Current: Pt completed lingual and pharyngeal exercises with mod cues 10/10x each.   Patient and/or caregiver will demonstrate ability to prepare food and liquid consistencies at 80% accuracy: by 03/16/16 Current: Not addressed.   Patient and/or caregiver will demonstrate understanding of clinical signs and symptoms of aspiration at 80% accuracy: by 03/16/16 Current: Not addressed.  Patient will demonstrate use of compensatory strategy: by 03/16/16 with moderate cues Current: Pt was able to utilize chin tuck, small sips, and throat clear after all swallows of honey thick water with min cues.    Patient will demonstrate safe swallowing behaviors: by 03/16/16 with moderate cues Current: Pt was able to utilize safe swallowing recommendations with min cues.  Pt was able to verbalize recommendations independently.    Goals Long Term Dysphagia  Patient will tolerate least restrictive diet with no signs or symptoms of aspiration : by 03/23/16 Current: Pt tolerating honey thick water.  No overt signs of aspiration.  Pt just finished lunch.  No food assessed.     Time frame for goal to be met in: 3-7 x wee x 2 weekx by 03/23/16   Education: diet recommendations and examples, oral motor exercises, MBSS results and risks for aspiration.  Pt was able to  recall with mod cues.   Assessment  Pt was eager to participate in tx.  Pt was able to recall safe swallowing precautions, but required min cues to utilize.  Pt was able to recall tx exercises from recent oupt tx. Pt did better with less visitors and distractions compared to 03/09/16.    Plan  Cont speech tx for diet tolerance, education , and oral motor/pharyngeal strengthening exercises.     Patient was up in chair with call light within reach and all needs met.  Husband at bedside. Safety handoff completed with RN/Cindy who verbalized understanding.    Time  Start Time: 1345  Stop Time: 1426  Time Calculation (min): 41 min    Charges      $Swallow Tx : 1 Procedure     Fara Boros, M.S. CCC/SLP   Speech Therapist  ASCOM phone: [phone]  03/11/2016

## 2016-03-11 NOTE — Unmapped (Signed)
Problem: PHASE 3  Patients who are in an acute/subacute phase with multiple medical problems, able to actively participate with mobility, still weak but able to tolerate increased activity levels.   Goal: Continue Mobilization of patient, progress to Phase 4  Progressing:    Patient is able to move leg against gravity, follows commands, and actively participates in activities   Outcome: Progressing  .  Intervention: Educate/instruct pt and/or significant other/family  On importance of progressive mobilization   .  Intervention: Educate/instruct pt and/or significant other/family  On safety issues during transfers and walking   .  Intervention: Assess for self-turning/repositioning  .  Intervention: Sitting fully upright for 20 minutes  3 times daily   .  Intervention: Transfer training on use of walker  And/or assistance to bedside chair/commode   .  Intervention: Incentive spirometer TID  .  Intervention: Mobility sessions 30-60 minutes BID  .  Intervention: Maintain oxygenation greater than 88%  .  Intervention: Assess for orthostatic blood pressures  .

## 2016-03-11 NOTE — Unmapped (Signed)
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 White or Caucasian female ho presented to [facility] with Huntington chorea G10.  Pt. with mental status changes and hyponatremia.    Assessment  PT 6 Clicks  Help From Another Person Turning From Back to Side While Flat in Bed Without Using Siderails: A little  Help From Another Person Moving From Lying On Back To Sitting Without Using Siderails: A little  Help From Another Person Moving To And From Bed To Chair: A little  Help From Another Person Standing Up From Chair Using Your Arms: A little  Help From Another Person To Walk In Hospital Room: A little  Help From Another Person Climbing 3-5 Steps With A Railing: A little  PT 6 Clicks Score: 18  Assessment: Impaired Bed Mobility, Impaired Transfer Mobility, Impaired Ambulation, Impaired Balance, Impaired Strength, Impaired ROM, Impaired Safety Awareness, Deconditioning  Prognosis: Good    Goals  Pt Will Go Supine To Sit: Supervision  Sit To Stand: Supervision with RW  Pt Will Transfer Bed/Chair: Supervision (with RW)  Pt Will Ambulate: Supervision (with RW with 250')  Miscellaneous Goal #1: Patient to participate in LE HEP and balance activities to improve overall functional mobility.   Time frame for goals to be met in: 7 days (03/17/16)    Recommendation  Plan  Treatment/Interventions: LE strengthening/ROM, Neuromuscular Reeducation, Endurance training, Patient/family training, Therapeutic Activity, Equipment eval/education, Therapeutic Exercise, Gait training, Compensatory technique education, Continued evaluation, Stair  Training  PT Frequency: 3-5x/wk    Recommendation  Recommendation: Short-term skilled PT    Problem List  Patient Active Problem List   Diagnosis   ??? Huntington's chorea   ??? Asthma   ??? Esophageal reflux   ??? Osteopenia   ??? Fatigue   ??? Chronic sinusitis   ??? Anxiety   ??? Depression   ??? Paranoia   ??? Care involving speech-language therapy   ??? Hoarseness of voice   ??? Laryngopharyngeal reflux (LPR)   ??? Cough   ??? Allergic rhinitis   ??? GERD (gastroesophageal reflux disease)   ??? Anxiety and depression   ??? Huntington's disease   ??? Dysphagia   ??? Needs a medical hold   ??? Hyponatremia        Past Medical History  Past Medical History   Diagnosis Date   ??? Asthma    ??? Environmental allergies    ??? Superficial injury of face without infection 04/12/2009   ??? Hematoma, left eyebrow 04/18/2009   ??? 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   ??? Esophageal reflux 08/10/2010   ??? Osteopenia 08/10/2010   ??? Fracture of left orbit 2010   ??? Anxiety    ??? Fatigue 05/24/2011   ??? Chronic sinusitis 05/24/2011        Past Surgical History  Past Surgical History   Procedure Laterality Date   ??? Sinus surgery     ??? Tonsillectomy  Cognition:  Arousal/Alertness: Appropriate responses to stimuli  Orientation Level: Oriented to person;Oriented to place;Oriented to time  Safety Judgment: Decreased awareness of need for safety     Pain:  Pain Score: 0-No pain         Mobility:  Bed Mobility  Unable to assess (Comment) (pt up in chair at beginning/end of session)  Transfers  Sit to Stand: Contact Guard performed x 6 reps over tx session with emphasis on use of hands on armrests of chair for improved performance, control of movement particularly stand to sit.  Pt with poor insight into deficits, cont to require reinforcement of information  Stand Pivot: Contact Guard-min assist (using RW, max VCs to use RW correctly when backing up to chair)  Gait  Pattern:  (inconsistent step length and ability to control width of BOS, overall maiintains upright posture, occasional  VCs to  maintain closer proximity to RW)  Gait Assistance: Engineer, manufacturing systems Device: Rolling walker  Distance: 200', pt refusing to stop for seated rest  Balance  Sitting - Static: Fair  Sitting - Dynamic:  (-)  Standing - Static: Fair (-), with bil UE support  Standing - Dynamic: Fair (-) with bil UE support, Poor+ if without      Exercise:  Standing  Standing-Exercises: Specific exercises  Standing-Exercise Comments: unilateral forward and retro stepping, hip abd, marching.  Performed x 6-7 reps bil UE support and min assist for balance and external support for L hip abd stability .  VCs for emphasis on controlling speed for improved performance      Patient Education  PT performed transfer training with pt for sequencing, safety and hand placement with utilization of RW. Patient educated on pacing activity, safety with all mobility activity-reinforced need to use call light and have staff assist with all mobility activity      Position After Physical Therapy session:  Sitting in recliner chair with chair alarm activated, numerous friends in rm with pt  Call light and phone / communication device placed within patient's reach.      Handoff of Care:  Safety Handoff completed with RN Arline Asp after Rehabilitation session.      Time  Start Time: 1156  Stop Time: 1224  Time Calculation (min): 28 min    Charges  $Gait/Mobility: 8-22 mins  $Therapeutic Activity: 1 unit    Janetta Hora, PT  03/11/2016

## 2016-03-11 NOTE — Unmapped (Signed)
Problem: Fall Prevention  Goal: Patient will remain free of falls  Assess and monitor vitals signs, neurological status including level of consciousness and orientation. Reassess fall risk per hospital policy.    Ensure arm band on, uncluttered walking paths in room, adequate room lighting, call light and overbed table within reach, bed in low position, wheels locked, side rails up per policy, and non-skid footwear provided.   Outcome: Progressing  .  Intervention: Keep call light within reach  .  Intervention: Keep bed in low position  .  Intervention: Offer toileting every 2 hours  .  Intervention: Fall risk sign in place  .  Intervention: Fall risk bracelet in place  .  Intervention: Gait belt for transfers  .  Intervention: Patient moved closer to nurse station  .  Intervention: Telesitter  .  Intervention: Sitter at bedside  .  Intervention: Bed/chair alarm turned on  .  Intervention: Assess and monitor medications that may increase fall risk  benzodiazepines, narcotics, antidepressants, antihypertensives, antipsychotics, antiepileptics, sedatives, hypnotics, and diuretics   .  Intervention: Collaborate with physical therapy as needed  .

## 2016-03-11 NOTE — Unmapped (Signed)
[facility] HOSPITALIST PROGRESS NOTE    Name: Brenda [name]    DOB: September 30, 1955   MRN: [id number]   Admit Date: 03/08/2016       Patient class : Inpatient    Assessment & Plan:    -  Gen weakness, altered mental status, d/t progression of Huntington dz, exacerbated by hyponatremia  -  Hyponatremia d/t meds, polydipsia, impulsive behavior from HD; resolved  -  Oropharyngeal dysphagia d/t HD    Not safe for dc home, needs placement at SNF/IPR - CM following  Continue dysphagia diet  Medically stable for dc once arrangements completed      Subjective:      Still weak, no new changes.      Physical Exam:    BP 126/87 mmHg   Pulse 88   Temp(Src) 97.9 ??F (36.6 ??C) (Oral)   Resp 16   Ht 5' 5 (1.651 m)   Wt 147 lb (66.679 kg)   BMI 24.46 kg/m2   SpO2 98%   LMP 05/20/2011  O2 Flow Rate (L/min): 0 L/min    GEN: NAD  HEENT: EOMI; no icterus or injection; mucus membranes moist  CV: RRR; no murmurs  PULM: CTAB; nonlabored  GI: normoactive bowel sounds; soft; nontender; nondistended  EXT: no edema    Labs/ Imaging:    Reviewed      Duayne Cal, MD  Pager [phone]  03/11/2016, 6:07 PM

## 2016-03-11 NOTE — Unmapped (Signed)
Problem: Knowledge Deficit  Goal: Patient/family/caregiver demonstrates understanding of disease process, treatment plan, medications, and discharge instructions  Complete learning assessment and assess knowledge base.   Outcome: Progressing  .  Intervention: Provide teaching at level of understanding  .  Intervention: Provide teaching via preferred learning method  .

## 2016-03-11 NOTE — Unmapped (Signed)
[place]  [facility]  Medical Nutrition Therapy      Reason(s) for Completion: Nutrition Consult for: Difficulty chewing/swallowing    PLAN/RECOMMENDATIONS:   1. Continue current diet: Mechanical Soft, Honey Thick; per SLP recommendations remain upright for 59 minutes after meals, full supervision with meals, no straws, small bites/sips, eat slowly  2. Continue to encourage intakes to prevent wt loss  3. Monitor I/O's     ASSESSMENT:After completing the Nutrition-Focused Physical Assessment, the patient: does not have malnutrition; however, is at risk for nutritional compromise d/t: dysphagia and aspiration precautions     ?? Weight loss: No significant wt change   ?? Intake: Adequate intake  ?? Body Fat:   Area???s screened include: Loss of subcutaneous fat; orbital, triceps, fat overlying rib cage-  No depletion   ?? Muscle Mass: This includes loss of muscle around temples; clavicles, shoulders, scapula, thigh and calf - No depletion   ?? Fluid accumulation:  This includes general or local fluid accumulation of extremities, ascites, or edema - None  ?? Hand-grip strength: Not assessed     Skin Integrity:  Intact   Chewing/Swallowing Problems: Yes, SLP seen and evaluated   Nails & Hair: Normal    Currently Meeting Estimated Nutritional Needs: Pt has been eating 100% of all meals recorded     PATIENT INFORMATION: 60 y.o. Woman per H&P with multiple medical issues including Huntington's chorea.?? It appears from the outpatient records that her psychiatric component of the Huntington's may be  progressively worsening over time.??She was brought into the emergency room because of mental status changes.?? Apparently she may have taken her extra doses of clonazepam and trazodone as well as alcohol ingestion.?? Pt takes very large bites and sips.?? Pt is at very high risks for aspiration due to nature of progressive disease, impulsivity, and?? Inconsistent epiglottis inversion/decreased epiglottic coordination.?? Pt is  also very mobile at home and takes daily walks. RD spoke with pt and pt reports no weight loss and no decreased appetite. Pt had breakfast and is doing well. Will continue to monitor po tolerance and intakes, I/O's and clinical course.     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 Asthma    ??? Environmental allergies    ??? Superficial injury of face without infection 04/12/2009   ??? Hematoma, left eyebrow 04/18/2009   ??? 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   ??? Esophageal reflux 08/10/2010   ??? Osteopenia 08/10/2010   ??? Fracture of left orbit 2010   ??? Anxiety    ??? Fatigue 05/24/2011   ??? Chronic sinusitis 05/24/2011     Labs:     Lab Results   Component Value Date    CREATININE 0.81 03/11/2016    BUN 16 03/11/2016    NA 139 03/11/2016    K 4.1 03/11/2016    CL 105 03/11/2016    CO2 28 03/11/2016     Lab Results   Component Value Date    ALKPHOS 52 03/08/2016    ALT 15 03/08/2016    AST 21 03/08/2016    BILITOT 0.5 03/08/2016    ALBUMIN 3.9 03/08/2016    BILIDIRECT 0.1 03/08/2016    PROT 6.1* 03/08/2016     Lab Results   Component Value Date    MG 1.9 03/10/2016     Lab Results   Component Value Date    CALCIUM 9.2  03/11/2016    PHOS 3.2 03/10/2016     No results found for: PREALBUMIN    Weight hx:   Wt Readings from Last 30 Encounters:   03/11/16 0500 147 lb (66.679 kg)   03/10/16 0325 147 lb 3.2 oz (66.769 kg)   03/09/16 0130 149 lb (67.586 kg)   03/08/16 2200 157 lb (71.215 kg)   12/04/15 1537 139 lb (63.05 kg)   10/21/15 1337 136 lb (61.689 kg)   07/10/15 1533 136 lb (61.689 kg)   03/04/15 1539 132 lb (59.875 kg)   11/14/14 1452 140 lb (63.504 kg)   11/01/14 1102 140 lb (63.504 kg)   06/13/14 1519 142 lb (64.411 kg)   01/31/14 1512 139 lb (63.05 kg)   09/13/13 0930 136 lb (61.689 kg)   05/08/13 0808 134 lb (60.782 kg)   05/19/12 1459 130 lb (58.968 kg)   03/31/12 1529 133 lb (60.328 kg)   12/28/11 1210 129 lb (58.514  kg)   12/10/11 1528 129 lb (58.514 kg)   10/04/11 1315 133 lb (60.328 kg)   09/10/11 1148 125 lb (56.7 kg)   08/27/11 1040 128 lb (58.06 kg)   07/30/11 1241 128 lb (58.06 kg)   06/16/11 1540 125 lb (56.7 kg)   06/08/11 1418 121 lb (54.885 kg)   05/24/11 0920 122 lb (55.339 kg)   03/26/11 1249 128 lb (58.06 kg)   11/27/10 0949 126 lb 6.4 oz (57.335 kg)     Nutritional related medications:   Current Facility-Administered Medications   Medication Dose Frequency Provider Last Dose   ??? clonazePAM  3 mg Nightly (2100) Raleigh Nation, MD 3 mg at 03/10/16 2123   ??? heparin (porcine)  5,000 Units Q8H Raleigh Nation, MD 5,000 Units at 03/11/16 0528   ??? lithium  450 mg Nightly (2100) Raleigh Nation, MD 450 mg at 03/10/16 2123   ??? mirabegron  50 mg Daily 0900 Brian Clymer, DO 50 mg at 03/10/16 0847   ??? pantoprazole  40 mg DAILY 0600 Raleigh Nation, MD 40 mg at 03/11/16 1610   ??? risperiDONE  3 mg Nightly (2100) Arlys John Clymer, DO 3 mg at 03/10/16 2123   ??? traZODone  100 mg Nightly (2100) Raleigh Nation, MD 100 mg at 03/10/16 2123     Diet order:   Diet Orders          Diet dysphagia 3 Honey Thick starting at 09/05 1458        Height/Weight:  Weight: 147 lb (66.679 kg)   Height: 5' 5 (165.1 cm)   Body mass index is 24.46 kg/(m^2).  BMI Category: Normal:  18.5 - 24.99    Estimated needs:  Kcals/day: 1474-1675 kcal (22-25 kcal/kg)   Protein g/day: 67-80gm (1.0-1.2gm/kg)  Fluid ml/day:~55mL/kcal or MD order  Needs Based On: CBW- 67kg    Nutrition Diagnosis: Swallowing difficulty, self feeding difficulty   Related to: dysphagia   As evidenced by: pt requiring honey thick, mechanical soft , with supervision with meals     Nutrition Intervention:Meals & snacks (ND 1) and Meal selection assistance (ND 4.5)    Goals:   1. Pt will consume > 75% x 3 meals per day by f/u  2. Pt will comply with mechanical soft, Honey thick diet by f/u  3. Pt to continue to follow SLP recommendations by f/u     Monitoring/Evaluation:Energy intake  (1.1)    Follow up in:  3-5 days (9/11-9/13)    Patient has a nutrition status classification  of: 2- indicating patient is at mild nutritional risk    Jamai Dolce, RD, LD   03/11/2016 10:14 AM

## 2016-03-11 NOTE — Unmapped (Signed)
---  CASE MANAGEMENT NOTE---  Hospital Day # 2 for hyponatremia. Hyponatremia resolved. Na 139 today.     CM continues to follow for discharge planning. CM received communication via [employer] that [facility] is not in network with [employer], so they are unable to accept the patient.     CM met with patient's husband to explain the above information. Husband states that he is interested in a facility on the [place] side of town. CM sent referrals via [employer] to the following facilities:  1. [facility]  2. [facility]  3. [facility]  4. [facility]   5. [facility]    CM awaiting responses from these facilities.     Husband also interested in resources for long term plans for patient. CM agrees to get a list of facilities [place] for him to check out for potential long term placement, if necessary.    ---DISCHARGE PLAN SUMMARY---  Referrals pending.    Renato Shin, RN BSN  Case Manager  [phone]

## 2016-03-12 ENCOUNTER — Encounter: Payer: Managed Care, Other (non HMO) | Admitting: Family Medicine

## 2016-03-12 LAB — BASIC METABOLIC PANEL
Anion Gap: 7 mmol/L (ref 3–16)
BUN: 19 mg/dL (ref 7–25)
CO2: 27 mmol/L (ref 21–33)
Calcium: 9.3 mg/dL (ref 8.6–10.3)
Chloride: 105 mmol/L (ref 98–110)
Creatinine: 0.78 mg/dL (ref 0.60–1.30)
Glucose: 83 mg/dL (ref 70–100)
Osmolality, Calculated: 289 mOsm/kg (ref 278–305)
Potassium: 4.1 mmol/L (ref 3.5–5.3)
Sodium: 139 mmol/L (ref 133–146)
eGFR AA CKD-EPI: >90 See note.
eGFR NONAA CKD-EPI: 83 See note.

## 2016-03-12 MED FILL — MYRBETRIQ 50 MG TABLET,EXTENDED RELEASE: 50 50 mg | ORAL | Qty: 1

## 2016-03-12 MED FILL — LITHIUM CARBONATE ER 450 MG TABLET,EXTENDED RELEASE: 450 450 MG | ORAL | Qty: 1

## 2016-03-12 MED FILL — HEPARIN (PORCINE) 5,000 UNIT/ML INJECTION SOLUTION: 5000 5,000 unit/mL | INTRAMUSCULAR | Qty: 1

## 2016-03-12 MED FILL — RISPERIDONE 1 MG TABLET: 1 1 MG | ORAL | Qty: 1

## 2016-03-12 MED FILL — PANTOPRAZOLE 40 MG TABLET,DELAYED RELEASE: 40 40 MG | ORAL | Qty: 1

## 2016-03-12 MED FILL — TRAZODONE 100 MG TABLET: 100 100 MG | ORAL | Qty: 1

## 2016-03-12 MED FILL — CLONAZEPAM 1 MG TABLET: 1 1 MG | ORAL | Qty: 3

## 2016-03-12 NOTE — Unmapped (Signed)
---  CASE MANAGEMENT NOTE---  CM continues to follow for discharge planning. Placement has been difficult, as facilities are stating they are unable to meet her care needs. The following facilities have denied the patient:    1. [facility]  2. [facility]  3. [facility]  4. [facility]  5. [facility]  6. [facility]  7. [facility]  8. [facility]  9. [facility]  10. [facility]    CM also called and spoke with Elijah Birk at [facility], which is a LTC facility for neurologic disorders. Elijah Birk states that they do on ocassion take short term patients, but at this time they have no bed availability.     CM briefly met with patient's husband and explained the barriers to finding her placement. CM states that she will have to start looking into dementia/locked units for placement as most of the facilities are worried about elopement. He verbalizes understanding. CM will continue to send more referrals, but, likely the discharge plan needs to be re-evaluated. CM encouraged husband to be thinking of long term plan.      ---DISCHARGE PLAN SUMMARY---  Discharge planning pending.    Renato Shin, RN BSN  Case Manager

## 2016-03-12 NOTE — Unmapped (Signed)
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 White or Caucasian female ho presented to [facility] with Huntington chorea G10.  Pt. with mental status changes and hyponatremia.    Assessment  PT 6 Clicks  Help From Another Person Turning From Back to Side While Flat in Bed Without Using Siderails: A little  Help From Another Person Moving From Lying On Back To Sitting Without Using Siderails: A little  Help From Another Person Moving To And From Bed To Chair: A little  Help From Another Person Standing Up From Chair Using Your Arms: A little  Help From Another Person To Walk In Hospital Room: A little  Help From Another Person Climbing 3-5 Steps With A Railing: A little  PT 6 Clicks Score: 18  Assessment: Impaired Bed Mobility, Impaired Transfer Mobility, Impaired Ambulation, Impaired Balance, Impaired Strength, Impaired ROM, Impaired Safety Awareness, Deconditioning  Prognosis: Good     If patient is d/c from hospital prior to next PT session, this note will serve as a discharge summary with goal status as noted below. D/c acute care PT services pending d/c from hospital.      Goals  Pt Will Go Supine To Sit: Supervision (goal met)  Sit To Stand: Supervision with RW  Pt Will Transfer Bed/Chair: Supervision (with RW)  Pt Will Ambulate: Supervision (with RW with 250')  Miscellaneous Goal #1: Patient to participate in LE HEP and balance activities to improve overall functional mobility.   Time frame for goals to be met in: 7 days (03/17/16)    Recommendation  Plan  Treatment/Interventions: LE strengthening/ROM, Neuromuscular Reeducation,  Endurance training, Patient/family training, Therapeutic Activity, Equipment eval/education, Therapeutic Exercise, Gait training, Compensatory technique education, Continued evaluation, Stair Training  PT Frequency: 3-5x/wk    Recommendation  Recommendation: Short-term skilled PT    Problem List  Patient Active Problem List   Diagnosis   ??? Huntington's chorea   ??? Asthma   ??? Esophageal reflux   ??? Osteopenia   ??? Fatigue   ??? Chronic sinusitis   ??? Anxiety   ??? Depression   ??? Paranoia   ??? Care involving speech-language therapy   ??? Hoarseness of voice   ??? Laryngopharyngeal reflux (LPR)   ??? Cough   ??? Allergic rhinitis   ??? GERD (gastroesophageal reflux disease)   ??? Anxiety and depression   ??? Huntington's disease   ??? Dysphagia   ??? Needs a medical hold   ??? Hyponatremia        Past Medical History  Past Medical History   Diagnosis Date   ??? Asthma    ??? Environmental allergies    ??? Superficial injury of face without infection 04/12/2009   ??? Hematoma, left eyebrow 04/18/2009   ??? 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   ??? Esophageal reflux 08/10/2010   ??? Osteopenia 08/10/2010   ??? Fracture of left orbit 2010   ??? Anxiety    ???  Fatigue 05/24/2011   ??? Chronic sinusitis 05/24/2011        Past Surgical History  Past Surgical History   Procedure Laterality Date   ??? Sinus surgery     ??? Tonsillectomy         Cognition:  Arousal/Alertness: Appropriate responses to stimuli  Orientation Level: Oriented to person;Oriented to place  Safety Judgment: Good awareness of safety precautions     Pain:  Pain Score: 0-No pain  Therapist reported pain to:: RN monitoring       Mobility:  Bed Mobility  Supine to Sit: Modified independent (Device)  Sit to Supine: Modified independent (Device)  Transfers  Sit to Stand: Contact Guard (to RW)  Bed to Chair: Min assist to right  Lateral Transfers: Minimal assistance (with rolling walker, cues for safety, hand placement on walker)  Gait  Pattern:  (slightly forward flexed posture, asymmetrical step length)  Gait Assistance:  Contact Guard;Minimal  Assistive Device: Rolling walker  Distance: 150' - pt reporting fatigue due to new medication, veering left intermittently   Balance  Sitting - Static: Good  Sitting - Dynamic: Good  Standing - Static: Fair  Standing - Dynamic: Fair      Patient Education:  1. Pt educated on role/plan of care of PT, fall risk prevention, discharge planning, and safety throughout functional mobility.   2. Educated on benefit of OOB activity as tolerated. Pt reporting fatigue today due to new medication and already working with OT. PT encouraged pt to walk with other staff members if feeling better later today/over the weekend.     Position After Physical Therapy session:   Supine in bed, pt educated to call for assist prior to getting up.   Call light and phone / communication device placed within patient's reach.  Bed alarm applied to patient.    Handoff of Care:  Safety Handoff completed with RN after Rehabilitation session        Time  Start Time: 1222  Stop Time: 1233  Time Calculation (min): 11 min    Charges       $Gait/Mobility: 8-22 mins           Rennis Golden PT, DPT  Physical Therapist License [id number]  Phone [phone]  03/12/2016  1:14 PM

## 2016-03-12 NOTE — Unmapped (Signed)
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 Will re-attempt as appropriate.     Lilia Argue MS-CCC-SLP  Ascom [phone]  Speech Pathologist  03/12/2016

## 2016-03-12 NOTE — Unmapped (Signed)
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 White or Caucasian female ho presented to [facility] with Huntington chorea G10.  Pt. with mental status changes and hyponatremia.      Assessment  OT 6 Clicks  Help From Another Person To Put On/Take Off Regular Lower Body Clothing: A lot  Help From Another Person Bathing ( including washing ,rinsing,drying): A lot  Help From Another Person Toileting, which includes using the toilet, bedpan or urinal: A lot  Help From Another Person To Put On/Take Off Regular Upper Body Clothing: A lot  Help From Another Person Taking Care Of Personal Grooming: A little  Help From Another Person Eating Meals: A little  OT 6 Clicks Score: 14  Assessment: Decreased activity tolerance, Decreased ADL status, Decreased high-level ADLs, Decreased Functional Mobility, Decreased Balance, Decreased self-care trans, Decreased cognition, Decreased Safe judgement during ADL, Decreased fine motor control, Decreased UE strength, Decreased UE ROM  Prognosis: Good  Goal Formulation: Patient    Goals  Pt Will demonstrate supine to sit to prep for ADLs: Stand by assist  Pt Will demonstrate functional chair transfer: Stand by assist  Pt Will demonstrate toilet transfer: Contact Guard  Pt Will complete toileting with: Minimal  Pt Will demonstrate feeding task: Stand by assist  Pt Will demonstrate grooming task: Contact Guard  Pt  Will demonstrate UE ADLs: Minimal  Pt Will demonstrate LE ADLs: Minimal (met 03/12/16)  Time frame for goals to be met in: 7 days 03/17/16    Recommendation  Plan  Treatment Interventions: ADL retraining, Lower Extremity  Intervention, Therapeutic Activity, Activity Tolerance training, Cognitive reorientation, Functional transfer training, Energy Conservation, Patient/Family training, Equipment eval/education, Compensatory technique education  Progress: Improving as expected  OT Frequency: 3-5x/wk  Recommendation  Recommendation: Short-term skilled OT  Equipment Recommended: Defer at this time      Problem List  Patient Active Problem List   Diagnosis   ??? Huntington's chorea   ??? Asthma   ??? Esophageal reflux   ??? Osteopenia   ??? Fatigue   ??? Chronic sinusitis   ??? Anxiety   ??? Depression   ??? Paranoia   ??? Care involving speech-language therapy   ??? Hoarseness of voice   ??? Laryngopharyngeal reflux (LPR)   ??? Cough   ??? Allergic rhinitis   ??? GERD (gastroesophageal reflux disease)   ??? Anxiety and depression   ??? Huntington's disease   ??? Dysphagia   ??? Needs a medical hold   ??? Hyponatremia        Past Medical History  Past Medical History   Diagnosis Date   ??? Asthma    ??? Environmental allergies    ??? Superficial injury of face without infection 04/12/2009   ??? Hematoma, left eyebrow 04/18/2009   ??? 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   ??? Esophageal reflux 08/10/2010   ??? Osteopenia 08/10/2010   ??? Fracture of left orbit 2010   ??? Anxiety    ??? Fatigue 05/24/2011   ??? Chronic sinusitis 05/24/2011        Past Surgical History  Past Surgical History   Procedure Laterality Date   ??? Sinus surgery     ???  Tonsillectomy         Cognition  Overall Cognitive Status: Impaired  Attention Span: Attends with cues to redirect  Memory: Decreased recall of biographical information  Orientation Level: Oriented to place;Oriented to person  Safety Judgment: Decreased awareness of need for safety    Pain  Pain Score: 0-No pain  Therapist reported pain to:: RN monitoring     Mobility  Bed Mobility  Supine to Sit: Contact Guard  Functional Transfers  Sit to Stand: Contact Guard  Bed to Chair: Min assist to right  Lateral Transfers: Minimal assistance (with rolling walker, cues for safety,  hand placement on walker)  Shower Transfers:  (, cues for safety.  Recommend grab bar for use at home.  )  Functional Mobility: Minimal (with rolling walker in the room and bathroom)  Balance  Sitting - Static: Fair  Sitting - Dynamic: Fair  Standing - Static: Poor  Standing - Dynamic: Poor (with bilateral UE support)        Exercises  Interventions  Fine Motor Training: Putty (with yellow therapy putty, hand flexion, finger flexion, pinch strengthening exercises, issued hand out)    ADL  Where Assessed: Edge of bed  Eating Assistance: Stand by  Eating Deficit: Beverage management  Grooming Assistance:  (Pt. stated she completed grooming task standing at sink earlier this date and declines at this time)  LE Dressing Assistance: Contact guard assist (max cues for safety, to bring bilateral feet up to knees )  LE Dressing Deficit: Don/doff R sock;Don/doff L sock    Patient Education  Pt. Educated on role of OT/POC, functional transfers, and safety with ADLs/IADLs.  Pt. Demo fair understanding of education.    Pt. Sitting in chair with needs in reach and chair alarm activated.    Handoff of Care:  Safety Handoff completed with RNRoddie Mc after Rehabilitation session.         Time  Start Time: 1105  Stop Time: 1130  Time Calculation (min): 25 min    Charges       $Therapeutic Exercise: 8-22 mins  $Therapeutic Activity: 8-22 mins       Darene Lamer, OTR/L   License # [id number]  Ascom:  [phone]  03/12/2016

## 2016-03-12 NOTE — Unmapped (Signed)
[place] HOSPITALIST PROGRESS NOTE    Name: Brenda [name]    DOB: [birth year]   MRN: [id number]   Admit Date: 03/08/2016       Patient class : Inpatient    Assessment & Plan:    -  Gen weakness, altered mental status, d/t progression of Huntington dz, exacerbated by hyponatremia  -  Hyponatremia d/t meds, polydipsia, impulsive behavior from HD; resolved  -  Oropharyngeal dysphagia d/t HD    Not safe for dc home, needs placement at SNF/[facility] - CM following  Continue dysphagia diet  Patient/family inquiring about medication changes for patient comfort, saying her current regimen makes her very anxious as well as thirsty most of the time - will ask neurology to see for additional recommendations    Subjective:      Still weak, more anxious today, would like to change medications    Physical Exam:    BP 118/73 mmHg   Pulse 91   Temp(Src) 98.3 ??F (36.8 ??C) (Oral)   Resp 16   Ht 5' 5 (1.651 m)   Wt 142 lb 1.6 oz (64.456 kg)   BMI 23.65 kg/m2   SpO2 97%   LMP 05/20/2011  O2 Flow Rate (L/min): 0 L/min    GEN: NAD  HEENT: EOMI; no icterus or injection; mucus membranes moist  CV: RRR; no murmurs  PULM: CTAB; nonlabored  GI: normoactive bowel sounds; soft; nontender; nondistended  EXT: no edema    Labs/ Imaging:    Reviewed      Duayne Cal, MD  Pager [phone]  03/12/2016, 7:08 PM

## 2016-03-12 NOTE — Unmapped (Signed)
Problem: Knowledge Deficit  Goal: Patient/family/caregiver demonstrates understanding of disease process, treatment plan, medications, and discharge instructions  Complete learning assessment and assess knowledge base.   Outcome: Progressing  .  Intervention: Provide teaching at level of understanding  .  Intervention: Provide teaching via preferred learning method  .      Problem: Fall Prevention  Goal: Patient will remain free of falls  Assess and monitor vitals signs, neurological status including level of consciousness and orientation. Reassess fall risk per hospital policy.    Ensure arm band on, uncluttered walking paths in room, adequate room lighting, call light and overbed table within reach, bed in low position, wheels locked, side rails up per policy, and non-skid footwear provided.   Outcome: Progressing  .  Intervention: Keep call light within reach  .  Intervention: Keep bed in low position  .  Intervention: Offer toileting every 2 hours  .      Comments:   Safety maintained, patient remains free from falls or injury. Will continue with care plan.

## 2016-03-12 NOTE — Unmapped (Signed)
Speech Language Pathology  Dysphagia Treatment Note    Continue mechanical soft/Dysphagia 3 diet with Honey thick liquids with use of chin tuck and throat clear. Recommend safe swallowing strategies and supervision with meals.       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 Pt with breakfast and agreeable to treatment. Pt perseverating on occupational therapy, easily redirected.      Objective   Goals  Goals Short Term Dysphagia  Patient will demonstrate use of swallowing exercises: by 03/16/16 Current: Not addressed  Patient and/or caregiver will demonstrate ability to prepare food and liquid consistencies at 80% accuracy: by 03/16/16 Current: not addressed   Patient and/or caregiver will demonstrate understanding of clinical signs and symptoms of aspiration at 80% accuracy: by 03/16/16 Current: ST provided verbal education to patient and husband (see below). Pt states she is still coughing on honey thick liquids, no noted over s/s of aspiration/penetration this date with trials.   Patient will demonstrate use of compensatory strategy: by 03/16/16 with moderate cues Current: pt required occasional verbal cues completed with 90% accuracy for chin tuck throat clear.   Patient will demonstrate safe swallowing behaviors: by 03/16/16 with moderate cues Current: Moderate cues allowed for patient to demonstrate slow rate, small bites/sips, and alternating bites and sips with 80% accuracy.   Goals Long Term Dysphagia  Patient will tolerate least restrictive diet with no signs or symptoms of aspiration : by 03/23/16 Current: No s/s of aspiration or penetration with meal this date of mechanical soft and Honey thick liquids.    Time frame for  goal to be met in: 3-7 x wee x 2 weekx by 03/23/16   Assessment  Pt continues at risk for aspiration, able to recall safe swallow strategies with completion of chin tuck and throat clear 90% accuracy, mild impulsivity with solids. Required moderate cues for slow rate, alternating bites/sips, and small bites. Visitors in room may have impacted patient's attention to meal task and strategies. No s/s of aspiration or penetration during meal.   Plan  Continue speech therapy for dysphagia for diet tolerance, compensatory strategies, and pharyngeal strengthening/airway protection exercises   Time  Start Time: 1032  Stop Time: 1048  Time Calculation (min): 16 min    Charges      $Swallow Tx : 1 Procedure     Lilia Argue MS-CCC-SLP  Ascom [phone]  Speech Pathologist  03/12/2016

## 2016-03-13 MED FILL — HEPARIN (PORCINE) 5,000 UNIT/ML INJECTION SOLUTION: 5000 5,000 unit/mL | INTRAMUSCULAR | Qty: 1

## 2016-03-13 MED FILL — LITHIUM CARBONATE ER 450 MG TABLET,EXTENDED RELEASE: 450 450 MG | ORAL | Qty: 1

## 2016-03-13 MED FILL — RISPERIDONE 1 MG TABLET: 1 1 MG | ORAL | Qty: 1

## 2016-03-13 MED FILL — TRAZODONE 100 MG TABLET: 100 100 MG | ORAL | Qty: 1

## 2016-03-13 MED FILL — PANTOPRAZOLE 40 MG TABLET,DELAYED RELEASE: 40 40 MG | ORAL | Qty: 1

## 2016-03-13 MED FILL — CLONAZEPAM 1 MG TABLET: 1 1 MG | ORAL | Qty: 3

## 2016-03-13 NOTE — Unmapped (Signed)
D. Pt is on the bed, drowsy. She denies pain. No respiratory distress.     A.  Assessment completed, see docflow sheet for details.  Updated patient on the plan of care.  Attended needs. Bed alarm on and call light within reach.     R. Pt is resting comfortably. Will monitor.

## 2016-03-13 NOTE — Plan of Care (Signed)
Problem: Fall Prevention  Goal: Patient will remain free of falls  Assess and monitor vitals signs, neurological status including level of consciousness and orientation.  Reassess fall risk per hospital policy.    Ensure arm band on, uncluttered walking paths in room, adequate room lighting, call light and overbed table within reach, bed in low position, wheels locked, side rails up per policy, and non-skid footwear provided.    Outcome: Progressing      Problem: Potential for Compromised Skin Integrity  Goal: Skin integrity is maintained or improved  Assess and monitor skin integrity. Identify patients at risk for skin breakdown on admission and per policy. Collaborate with interdisciplinary team and initiate plans and interventions as needed.   Outcome: Progressing

## 2016-03-13 NOTE — Progress Notes (Signed)
[facility]OSPITALIST PROGRESS NOTE    Name: Brenda [name]    DOB: 1956-01-12   MRN: [id number]   Admit Date: 03/08/2016       Patient class : Inpatient    Assessment & Plan:    -  Gen weakness, altered mental status, d/t progression of Huntington dz, exacerbated by hyponatremia  -  Hyponatremia d/t meds, polydipsia, impulsive behavior from HD; resolved  -  Oropharyngeal dysphagia d/t HD    Appreciate neurology assistance  Not safe for dc home, needs placement at SNF/[facility] - CM following  Continue dysphagia diet  HD SW engaged for assistance with placement    Subjective:      Still weak, though less anxious today. No new complaints.    Physical Exam:    BP 140/66 (BP Location: Left arm, Patient Position: Lying)   Pulse 100   Temp 98.3 F (36.8 C) (Oral)   Resp 16   Ht 5' 5 (1.651 m)   Wt 155 lb 10 oz (70.6 kg)   LMP 05/20/2011   SpO2 99%   BMI 25.90 kg/m   O2 Flow Rate (L/min): 0 L/min    GEN: NAD  HEENT: EOMI; no icterus or injection; mucus membranes moist  CV: RRR; no murmurs  PULM: CTAB; nonlabored  GI: normoactive bowel sounds; soft; nontender; nondistended  EXT: no edema    Labs/ Imaging:    Reviewed      Duayne Cal, MD  Pager [phone]  03/13/2016, 6:40 PM

## 2016-03-13 NOTE — Plan of Care (Signed)
Problem: Fall Prevention  Goal: Patient will remain free of falls  Assess and monitor vitals signs, neurological status including level of consciousness and orientation.  Reassess fall risk per hospital policy.    Ensure arm band on, uncluttered walking paths in room, adequate room lighting, call light and overbed table within reach, bed in low position, wheels locked, side rails up per policy, and non-skid footwear provided.    Outcome: Progressing

## 2016-03-13 NOTE — Consults (Signed)
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 female who follows with Dr. Gerilyn Pilgrim for the diagnosis of HD. She was admitted 4 days ago for hyponatremia which felt to be secondary to psychogenic polydipsia. According to her husband patient symptoms have been improving but still agitated and not back to baseline. He is concern that with her current condition he can not take care of her. Case manager, [place], has been working for patient placement but none of the 10 places she has contacted taking care with huntington and level of care she needs.     PAST HISTORY   has a past medical history of Anxiety; Asthma; Chronic sinusitis (05/24/2011); Environmental allergies; Esophageal reflux (08/10/2010); Fatigue (05/24/2011); Fracture of left orbit (2010); Hematoma, left eyebrow (04/18/2009); Huntington's chorea (08/11/2010); Osteopenia (08/10/2010); and Superficial injury of face without infection (04/12/2009).   has a past surgical history that includes Sinus surgery and Tonsillectomy.  family history includes Alzheimer's disease in her father; Anxiety disorder in her sister; Depression in her sister; Huntington's disease in her maternal uncle; Huntington's disease (age of onset: 74) in her mother; Pulmonary embolism in her mother; Suicidality in her sister.   reports that she has never smoked. She has never used smokeless tobacco. She reports that she drinks about 0.6 oz of alcohol per week . She reports that she does not use drugs.    MEDICATIONS  Scheduled Meds:   clonazePAM  3 mg Oral Nightly (2100)    heparin (porcine)  5,000 Units Subcutaneous Q8H    lithium  450 mg Oral Nightly (2100)    mirabegron  50 mg Oral Daily 0900    pantoprazole  40 mg Oral DAILY 0600    risperiDONE  3  mg Oral Nightly (2100)    traZODone  100 mg Oral Nightly (2100)     Continuous Infusions:   PRN Meds:.     MEDICATIONS PRIOR TO ADMISSION:  Prescriptions Prior to Admission   Medication Sig Dispense Refill Last Dose    cholecalciferol, vitamin D3, 1000 units tablet Take 1,000 Units by mouth daily.   03/08/2016 at Unknown time    clonazePAM (KLONOPIN) 1 MG tablet 3 tablets (3 mg total) at bedtime. (Patient taking differently: Take 3 mg by mouth at bedtime.    ) 60 tablet  03/08/2016 at Unknown time    lithium (ESKALITH) 450 MG CR tablet Take 450 mg by mouth At bedtime.   03/08/2016 at Unknown time    mirabegron (MYBETRIQ) 50 mg Tb24 Take 50 mg by mouth daily. Indications: Bladder Hyperactivity   03/08/2016    multivitamin (THERAGRAN) tablet Take 1 tablet by mouth daily.     03/08/2016 at Unknown time    omeprazole 20 mg TbEC Take 40 mg by mouth daily.   03/08/2016 at Unknown time    risperiDONE (RISPERDAL) 1 MG tablet Take 3 mg by mouth at bedtime.   03/08/2016 at Unknown time    traZODone (DESYREL) 100 MG tablet Take 1 tablet (100 mg total) by mouth at bedtime. Indications: SLEEP 90 tablet 3 03/08/2016 at Unknown time        ALLERGIES:  Zithromax [azithromycin]; Bactrim [sulfamethoxazole-trimethoprim]; Erythromycin; Erythromycin base; Methylphenidate; and Sulfa (sulfonamide antibiotics)  REVIEW OF SYSTEMS:  Please see HPI otherwise a 10-organ ROS was obtained and otherwise unremarkable.    PHYSICAL EXAM:  Temp:  [98.3 F (36.8 C)-98.9 F (37.2 C)] 98.4 F (36.9 C)  Heart Rate:  [42-91] 88  Resp:  [16] 16  BP: (117-124)/(52-78) 124/52  I briefly talked to patient, she was able to tell me she is in [facility] and today is September 9[birth year] it's my son's birthday. She had chorea movements and required assistance to move and walk.      LABS:  Results for JERRILYNN, SMALLWOOD (MRN [id number]) as of 03/13/2016 17:18   Ref. Range 03/08/2016 22:27 03/09/2016 04:07   Sodium Latest Ref Range: 133 - 146 mmol/L 121 (L) 140   Results for EMANDA, SOO  (MRN [id number]) as of 03/13/2016 17:18   Ref. Range 03/08/2016 22:27   Specific Gravity, Urine Latest Ref Range: 1.005 - 1.035  <1.005 (L)   Results for ARIAS, BARMES (MRN [id number]) as of 03/13/2016 17:18   Ref. 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 Since there has been no change in medication and Lithium level was wnl less likely to be the cause   Placement: Dr. Gerilyn Pilgrim informed Brenda [name] (HD social worker) who will help with placement     I spent 30 minutes providing care, managing the patient's case and/or counseling the patient and/or family.    Raeanne Gathers, MD  Assistant Professor of Neurology   Director of [facility]   [address]   [place], [place] [address]  [phone]

## 2016-03-14 MED ORDER — risperiDONE (RISPERDAL) tablet 2 mg
2 | Freq: Every evening | ORAL | Status: AC
Start: 2016-03-14 — End: 2016-03-15
  Administered 2016-03-15: 2 mg via ORAL

## 2016-03-14 MED FILL — CLONAZEPAM 1 MG TABLET: 1 1 MG | ORAL | Qty: 3

## 2016-03-14 MED FILL — HEPARIN (PORCINE) 5,000 UNIT/ML INJECTION SOLUTION: 5000 5,000 unit/mL | INTRAMUSCULAR | Qty: 1

## 2016-03-14 MED FILL — RISPERIDONE 2 MG TABLET: 2 2 MG | ORAL | Qty: 1

## 2016-03-14 MED FILL — TRAZODONE 100 MG TABLET: 100 100 MG | ORAL | Qty: 1

## 2016-03-14 MED FILL — PANTOPRAZOLE 40 MG TABLET,DELAYED RELEASE: 40 40 MG | ORAL | Qty: 1

## 2016-03-14 MED FILL — LITHIUM CARBONATE ER 450 MG TABLET,EXTENDED RELEASE: 450 450 MG | ORAL | Qty: 1

## 2016-03-14 NOTE — Progress Notes (Signed)
[facility] HOSPITALIST PROGRESS NOTE    Name: Brenda [name]    DOB: 1955/10/30   MRN: [id number]   Admit Date: 03/08/2016       Patient class : Inpatient    Assessment & Plan:    -  Gen weakness, altered mental status, d/t progression of Huntington dz, exacerbated by hyponatremia  -  Hyponatremia d/t meds, polydipsia, impulsive behavior from HD; resolved  -  Oropharyngeal dysphagia d/t HD    Appreciate neurology assistance  Not safe for dc home, needs placement at SNF/IP[facility] - CM following  Continue dysphagia diet, fluid restriction  HD SW engaged for assistance with placement    Subjective:      Still weak, less anxious today. No new complaints. No events overnight.    Physical Exam:    BP 126/55 (BP Location: Left arm, Patient Position: Lying)   Pulse 83   Temp 97.9 F (36.6 C) (Oral)   Resp 16   Ht 5' 5 (1.651 m)   Wt 157 lb 4 oz (71.3 kg)   LMP 05/20/2011   SpO2 97%   BMI 26.17 kg/m   O2 Flow Rate (L/min): 0 L/min    GEN: NAD  HEENT: EOMI; no icterus or injection; mucus membranes moist  CV: RRR; no murmurs  PULM: CTAB; nonlabored  GI: normoactive bowel sounds; soft; nontender; nondistended  EXT: no edema    Labs/ Imaging:    Reviewed      Duayne Cal, MD  Pager [phone]  03/14/2016, 4:52 PM

## 2016-03-14 NOTE — Plan of Care (Signed)
Problem: Fall Prevention  Goal: Patient will remain free of falls  Assess and monitor vitals signs, neurological status including level of consciousness and orientation.  Reassess fall risk per hospital policy.    Ensure arm band on, uncluttered walking paths in room, adequate room lighting, call light and overbed table within reach, bed in low position, wheels locked, side rails up per policy, and non-skid footwear provided.    Outcome: Progressing      Problem: Potential for Compromised Skin Integrity  Goal: Skin integrity is maintained or improved  Assess and monitor skin integrity. Identify patients at risk for skin breakdown on admission and per policy. Collaborate with interdisciplinary team and initiate plans and interventions as needed.   Outcome: Progressing

## 2016-03-14 NOTE — Consults (Addendum)
PSYCHIATRY CONSULT, Follow-up    Brenda [name] [id number]     Date/time of admission: 03/08/2016  9:51 PM  Length of Stay: 5    Brenda [name]  was cooperative and was able to give Partialinformation with chief complaint, history of present illness, past medical history, social history, past psychiatric history, review of systems  due to confusion and agitation. Therefore this information was largely obtained from family and medical record.    CC/Reason for Consult:  Hyponatremia       ASSESSMENT AND RECOMMENDATIONS: Brenda [name] is a 60 y.o. 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 Dx:  Meds  Will start tapering off some medications due to sedation... Take risperidone down to 2 mg tonight.    Risks and benefits to medications were explained including black box warnings.    Labs :  - recommend ordering the following to further address organic etiologies : CB  C, RFP, LFTs, Albumin, Ammonia, TSH, FT4, RPR, HIV, Vit D, Vit B12, EKG    Monitoring required : lithium, thryoird panel2. Sitter is not needed due to fall  elopment risk delirium suicidal risk agitation    Medical hold is not Needed Due to lack of capacity to make decision to leave AMA. Medical hold is in order sets. Document lack of  capacity with dotphrase medcap    3. Delirium  And Agitation Recs      Patient does have delirium   Patient does not have agitation requiring medical intervention     Evaluation :  CBC, RFP, LFTs, Albumin, Ammonia, TSH, FT4, RPR, HIV, Vit D,    Vit B12, EKG     Monitoring :     Orientation   Provide visual and hearing aids   Encourage communication and reorient patient   Have familiar objects from patient's home in room   Allow daytime television with daily news   Non-verbal music  Environment   Sleep hygiene: lights off at night and on during the day   Control excess noise, especially at night   Ambulate or mobilize patient as early and often as feasible    Minimize restraints (and lines and/or foleys and/or feeding tubes as able)    Medical    Minimize anticholingeric medications    Minimize polypharmacy   Minimize overnight checks/vitals to encourage restful  sleep patterns    4. Substance Abuse/Dep/Withdrawal:    [facility] EM Alcohol Withdrawl Evaluation Guideline   Alcohol : CIWA protocol   Thiamine, folate supplementation     Opioid :COWS protocol   gabapentin 300 mg po tid    symptomatic relief for pain, nausea, vomiting, diarrhea  Clonidine 0.1 mg po tid prn elevated pulse and BP    5. SafetyRisk Assessment  Patient presents with  no suicidal ideation, suicidal ideation without plan  No specific plan to harm self    Overall the risk: moderate for future dangerousness;   However the patient's imminent risk is low as the patient is HYQ:MVHQION to engage sober, future oriented, adherent with medication, has outpatient psychiatric follow-up established, has improved social support.    Plan for LOW risk: (ie pt has thoughts of death but NO imminent plan/intent OR behavior) pt has numbers to outpatient clinic, social supports and National Suicide Prevention Lifeline number (1-800-273-TALK-8255), or [facility] suicide hotline 408-877-8834), PES [phone] and Mobile Crisis Team [phone]. Pt is aware to contact  provider(s), go to the PES and let friends/family know that he/she is feeling worse.     6. Disposition     Patient may  require admission to inpatient psychiatry once medically stable (no O2 requirement, no lines or drains, no IV medications, vitals stable for minimum of 24hrs, appropriate mobility established either by baseline or PT/OT evals, and outpatient medical followup plan in place)  The following need to be determined : collateral, safety plan, outpatient followup, medical stability    Collateral information has been contacted. Yes  Patient has a good safety plan in place Yes   Patient denies no access to  firearms or other weapons     Patient does not a statement of belief     Treatment plan reviewed with the patient.  Medication risks/benefit reviewed with the patient  Thank you for this consult, please call us with any further questions. We will be continue to follow at this time.    Brax Walen BETH Debbe Bales, MD, ________________________________________________________________________    HPI: Brenda [name] is a 60 y.o. White or Caucasian female with a past medical history of  has a past medical history of Anxiety; Asthma; Chronic sinusitis (05/24/2011); Environmental allergies; Esophageal reflux (08/10/2010); Fatigue (05/24/2011); Fracture of left orbit (2010); Hematoma, left eyebrow (04/18/2009); Huntington's chorea (08/11/2010); Osteopenia (08/10/2010); and Superficial injury of face without infection (04/12/2009). who presented to [facility] on 03/08/2016  with Hyponatremia    Psychiatry was consulted for  dleirium.     Interval History:    Sleep: excessive worse  Anhedonia: moderate the same  Appetite : diminished the same  Energy: decreasedworse    Anxiety/Panic:  mildthe same  Guilt: moderate the same  Hopeless:moderatethe same  Suicidal Ideation/ Homicidal Ideation no suicidal ideation, no homicidal ideationthe same      Psychosis   Denied, Auditory, Visual, Tactile and Chemosensorythe same   Denies  paranoia,  suspiciousness, hallucinations, delusional thinking and agitationimproved    Delirium Patient is currently experiencing altered mental status as evidenced by lack of full attention and concentration.improved    Dementia:       Past Psychiatric History:    New Information : husband does not feel that psych meds have helped, pt has not had insomnia in years    Substance Use History: New Information : none known    Past Medical History:   Past Medical History:   Diagnosis Date    Anxiety     Asthma     Chronic sinusitis 05/24/2011    Environmental allergies     Esophageal reflux 08/10/2010    Fatigue 05/24/2011    Fracture of left orbit 2010    Hematoma, left eyebrow 04/18/2009    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    Osteopenia 08/10/2010  Superficial injury of face without infection 04/12/2009     Past Surgical History:   Procedure Laterality Date    SINUS SURGERY      TONSILLECTOMY         Social/Developmental History:      Social History     Social History    Marital status: Married     Spouse name: N/A    Number of children: N/A    Years of education: N/A     Occupational History    Lawyer      Social History Main Topics    Smoking status: Never Smoker    Smokeless tobacco: Never Used    Alcohol use 0.6 oz/week     1 Cans of beer per week      Comment: Occasionally    Drug use: No    Sexual activity: Yes     Partners: Male     Other Topics Concern    Caffeine Use Yes    Occupational Exposure No    Exercise Yes    Seat Belt Yes     Social History Narrative    None        Children:/Support:spouse     Family History: reviewed  Family History   Problem Relation Age of Onset    Pulmonary embolism Mother     Huntington's disease Mother 63    Alzheimer's disease Father     Depression Sister     Suicidality Sister      attemped suicide 2005    Anxiety disorder Sister     Huntington's disease Maternal Uncle         Allergies:  Allergies   Allergen Reactions    Zithromax [Azithromycin]  Anaphylaxis     Other reaction(s): Other (See Comments)  abdominal pain    Bactrim [Sulfamethoxazole-Trimethoprim] Nausea Only and Hives    Erythromycin Diarrhea    Erythromycin Base Diarrhea and Nausea And Vomiting    Methylphenidate Hives    Sulfa (Sulfonamide Antibiotics) Hives       Current Medications ordered:  Scheduled Meds:   clonazePAM  3 mg Oral Nightly (2100)    heparin (porcine)  5,000 Units Subcutaneous Q8H    lithium  450 mg Oral Nightly (2100)    mirabegron  50 mg Oral Daily 0900    pantoprazole  40 mg Oral DAILY 0600    risperiDONE  3 mg Oral Nightly (2100)    traZODone  100 mg Oral Nightly (2100)     Continuous Infusions:   PRN Meds:.      ROS:   Pt declines to answer due to fatique  Psych : see above  Comment:    OBJECTIVE:  Vitals:  Vitals:    03/13/16 2350 03/14/16 0345 03/14/16 0731 03/14/16 1537   BP: 103/50  119/63 126/55   BP Location: Left arm  Left arm Left arm   Patient Position: Lying  Lying Lying   Pulse: 82  78 83   Resp:   14 16   Temp: 98.2 F (36.8 C)  97.9 F (36.6 C) 97.9 F (36.6 C)   TempSrc: Oral  Axillary Oral   SpO2: 97%  98% 97%   Weight:  157 lb 4 oz (71.3 kg)     Height:           Physical Exam: reviewed that done by primary team     Mental Status Exam      General     Development :normal  Body Habitus: underweight    Grooming/Hygiene : clean     Demeanor: polite and cooperative    Eye Contact:  avoidant    Speech   Rate: Slow   Volume: Quiet   Articulation:Normal   Quality: Paucity     Motor   Strength/Tone: cog wheel rigidity   Atrophy:present   Abnormal Movements: movements   Station:rigid/erect     Gait: needs assistance ( walker/wheelchair)      Mood/ Affect   Mood:  depressed    Affect - Range: constricted      - Reactivity: flat      - Appropriateness: Inappropriate to mood and/or situation    Thought    Content: normal   Process: normal   Associations: normal   Physical and Psychological Reality Testing : normal    Cognitive   Level of Alertness:  sedated   Orientation to: person      Not oriented to time/date   Alert and Oriented in all Spheres: no   Recent Memory: mild impairment   Remote Memory: mild impairment   Attention/Concentration/Focus: moderate impairment   Language: mild impairment   Fund of Knowledge: mild impairment    Safety   Harm to Self: no   Harm to Others: no    Insight/ Judgement   Insight: full   Judgment: fair        Laboratory Data       Complete Blood Count:  No results for input(s): WBC, HGB, HCT, MCV, PLT in the last 72 hours.    Basic Metabolic Panel:  Recent Labs      03/12/16   0438   NA  139   K  4.1   CL  105   CO2  27   BUN  19       Hepatic Panel:      Lab name 03/08/16  2227   AST 21   ALT 15   ALBUMIN 3.9           Drug Level:  Lab Results   Component Value Date    LITHIUM 0.5 (L) 03/09/2016         URINE      Lab 03/08/16  2227   COLOR, URINE Colorless*   CLARITY Clear   PROTEIN UA Negative   PH UA 6.0   SPECIFIC GRAVITY, URINE <1.005*   GLUCOSE UA Negative   BLOOD UA Negative   LEUKOCYTES UA Negative   NITRITE UA Negative   BILIRUBIN UA Negative   UROBILINOGEN UA <2.0     Lab Results   Component Value Date    COCAINSCRNUR Negative 03/08/2016    OPIATESCRNUR Negative 03/08/2016    THCSCRNUR Negative 03/08/2016    BENZOSCRNUR Negative 03/08/2016    BARBSCRNUR Negative 03/08/2016    METHADSCRNUR Negative 03/08/2016       PREGNANCY:     Delirium  No results found for: HCG    No results found for: ESR, CRP    No results found for: HIV12ABAGN                    Lab name 03/08/16  2227   ALBUMIN 3.9         Lab name 03/09/16  0407   TSH 3.14        Antipsychotic monitoring:          Other Labs:       Lab Results   Component Value Date  CHOLTOT 234 (H) 06/02/2011    TRIG 140 06/02/2011    HDL 76 (H) 06/02/2011    LDL 129 (H) 06/02/2011            Lab name 03/09/16  0407   TSH 3.14          Diagnostic Studies     No orders to display       Radiology:               EKG:  No results found for this or any previous visit.    Chart  [facility], Collateral Contacted, Outside clinician contacted  Extensive records review has been done :  Outside record have been requested  Old medical records.  Nursing notes.    Patient's records were obtained and reviewed.  Outside clinician has notbeen contacted, will continue to try    Collateral information has been obtained    Signed:  Fabio Pierce, MD  03/14/2016, 4:21 PM       Note : I spent 40 min in coordination of care for patient , including over 20 min of direct assessment and counselling of pt and her husband. We had an extensive discussion about both of their concerns about her sedation and wanting to try tapering off some of the medications. I explained to them that decreasing a dose of medication might or might not exacerbate her situation but that we could, in the hospital setting, quickly reverse the medication change. They were both in agreement with a trial of decreasing the risperidone from 3 mg to 2 mg for one night trial

## 2016-03-15 MED ORDER — risperiDONE M-TAB (RISPERDAL M-TABS) disintegrating tablet 1 mg
1 | Freq: Once | ORAL | Status: AC
Start: 2016-03-15 — End: 2016-03-15
  Administered 2016-03-15: 15:00:00 1 mg via ORAL

## 2016-03-15 MED ORDER — clonazePAM (klonoPIN) tablet 0.5 mg
0.5 | Freq: Two times a day (BID) | ORAL | Status: AC | PRN
Start: 2016-03-15 — End: 2016-03-16
  Administered 2016-03-15 – 2016-03-16 (×2): 0.5 mg via ORAL

## 2016-03-15 MED ORDER — diazePAM (VALIUM) tablet 5 mg
5 | Freq: Once | ORAL | Status: AC
Start: 2016-03-15 — End: 2016-03-15
  Administered 2016-03-15: 09:00:00 5 mg via ORAL

## 2016-03-15 MED ORDER — risperiDONE (RISPERDAL) tablet 3 mg
1 | Freq: Every evening | ORAL | Status: AC
Start: 2016-03-15 — End: 2016-03-16
  Administered 2016-03-16: 01:00:00 3 mg via ORAL

## 2016-03-15 MED FILL — TRAZODONE 100 MG TABLET: 100 100 MG | ORAL | Qty: 1

## 2016-03-15 MED FILL — LITHIUM CARBONATE ER 450 MG TABLET,EXTENDED RELEASE: 450 450 MG | ORAL | Qty: 1

## 2016-03-15 MED FILL — PANTOPRAZOLE 40 MG TABLET,DELAYED RELEASE: 40 40 MG | ORAL | Qty: 1

## 2016-03-15 MED FILL — RISPERIDONE 1 MG DISINTEGRATING TABLET: 1 1 MG | ORAL | Qty: 1

## 2016-03-15 MED FILL — RISPERIDONE 1 MG TABLET: 1 1 MG | ORAL | Qty: 1

## 2016-03-15 MED FILL — DIAZEPAM 5 MG TABLET: 5 5 MG | ORAL | Qty: 1

## 2016-03-15 MED FILL — HEPARIN (PORCINE) 5,000 UNIT/ML INJECTION SOLUTION: 5000 5,000 unit/mL | INTRAMUSCULAR | Qty: 1

## 2016-03-15 MED FILL — CLONAZEPAM 1 MG TABLET: 1 1 MG | ORAL | Qty: 3

## 2016-03-15 MED FILL — CLONAZEPAM 0.5 MG TABLET: 0.5 0.5 MG | ORAL | Qty: 1

## 2016-03-15 NOTE — Nursing Note (Addendum)
Patient found to be rolling around in bed, flailing arms, states she needs medication, she can't stop moving. No PRN medications ordered for restlessness/agitation. Page sent out to Hospitalist, no response within 10 mins, paged to consulting Psych, One time dose of 1mg  disintegrating tab Risperdal ordered. Awaiting pharmacy to send.     Brenda [name] B Lakin Rhine

## 2016-03-15 NOTE — Nursing Note (Signed)
Pt requesting to take Heparin and Protonix with am medication not at 6 am. Pt stated she wanted to sleep as much as possible after taking Valium. Will retime medication and notify oncoming nurse.

## 2016-03-15 NOTE — Plan of Care (Signed)
Problem: Fall Prevention  Goal: Patient will remain free of falls  Assess and monitor vitals signs, neurological status including level of consciousness and orientation.  Reassess fall risk per hospital policy.    Ensure arm band on, uncluttered walking paths in room, adequate room lighting, call light and overbed table within reach, bed in low position, wheels locked, side rails up per policy, and non-skid footwear provided.    Outcome: Progressing  Remains free from falls

## 2016-03-15 NOTE — Telephone Encounter (Signed)
Please tell her that our HD social worker, Shon Hale is working on placement options for [employer].  I will call psychiatrist regarding increasing Brenda [name]'s risperidone back up.

## 2016-03-15 NOTE — Progress Notes (Signed)
---   CASE MANAGEMENT NOTE ---  Met with patient's spouse, [name], to update regarding placement barriers.    [name] states that he would like additional referrals sent to additional facilities in the [place] area as well as [place].    Referrals submitted via [employer] to the following facilities:   [facility]   [facility]   [facility]   [facility]   [facility]   [facility]   [facility]   [facility]    [facility]    Will await responses.    Notified International aid/development worker, Boneta Lucks, and bedside RN, Nehemiah Settle , of patient's spouse concerns.      --- DISCHARGE PLAN ---  Placement pending at this time.      Cleon Gustin, RN CCM  Cell [phone]

## 2016-03-15 NOTE — Telephone Encounter (Signed)
Returned call to [employer].  LM for him to return my call.

## 2016-03-15 NOTE — Progress Notes (Signed)
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  She stated, I need my medicine (SLP called and informed RN/Brooke of patient request).  She then stated, I need to sleep, and pulled covers over her head.  SLP verbally encouraged patient to participate in swallowing exercises and oral intake for compensatory techniques, however patient stated , I need to sleep.  Come back later.  SLP will continue follow up as appropriate.    Rushton Early [facility]/SLP  03/15/2016   Ascom #[phone]

## 2016-03-15 NOTE — Nursing Note (Signed)
Pt awoke at 4:15 am c/o body not being able to turn off. Pt was rolling around on bed, failing arms crying for [place] to call the MD. Pt stated that MD had dropped her Risperidone down to 2 mg from 3 mg and she needed her extra pill. Notified MD. MD gave New orders for Valium 5mg  PO one time only. Pt took Medication. Will continue to monitor.

## 2016-03-15 NOTE — Telephone Encounter (Signed)
Pt's husband is calling to speak with Debi.

## 2016-03-15 NOTE — Nursing Note (Signed)
Patient family visiting, unhappy due to patient condition, patient currently very restless and agitated, and change in medication. Husband states he is unhappy that MD decreased dose of Risperdal without consulting outpatient psychiatrist, Dr. Christella Hartigan before changing. Husband states he knows Dr. Christella Hartigan was not contacted because he called the office himself and MD is out sick this week. RN informed Press photographer of concerns and frustrations, Charge will be in to speak with family.     Brenda [name] B Brenda [name]

## 2016-03-15 NOTE — Nursing Note (Signed)
Husband gave RN phone number for patient outpatient psychiatrist, Dr. Corinne Ports [phone]. Husband states he would like doctors to communicate with him, Dr. Corinne Ports and Dr. Christella Hartigan (outpatient neurologist) before changing medications. RN notified psych MD about concerns, and gave the phone number to MD, MD states she spoke with husband extensively regarding medication changes yesterday, and husband was agreeable. MD states she will follow up after her meeting. RN will continue to monitor.     Brenda [name] B Yuepheng Schaller

## 2016-03-15 NOTE — Consults (Signed)
PSYCHIATRY CONSULT, Follow-up    TEKARA WALLGREN [id number]     Date/time of admission: 03/08/2016  9:51 PM  Length of Stay: 6    ALAYNA SVEUM  was cooperative and was able to give Partialinformation with chief complaint, history of present illness, past medical history, social history, past psychiatric history, review of systems  due to confusion and agitation. Therefore this information was largely obtained from family and medical record.    CC/Reason for Consult:  Hyponatremia       ASSESSMENT AND RECOMMENDATIONS: MAYREN REILLEY is a 60 y.o. 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 Dx:  Meds  Will increase risperidone back to 3 mg tonight.    Risks and benefits to medications were explained including black box warnings.    Labs :  - recommend ordering the following to further address organic etiologies : CB  C, RFP, LFTs, Albumin, Ammonia, TSH, FT4, RPR, HIV, Vit D, Vit B12, EKG    Monitoring required : lithium, thryoird panel2. Sitter is not needed due to fall  elopment risk delirium suicidal risk agitation    Medical hold is not Needed Due to lack of capacity to make decision to leave AMA. Medical hold is in order sets. Document lack of capacity with [facility] medcap    3. Delirium  And  Agitation Recs      Patient does have delirium   Patient does not have agitation requiring medical intervention     Evaluation :  CBC, RFP, LFTs, Albumin, Ammonia, TSH, FT4, RPR, HIV, Vit D,    Vit B12, EKG     Monitoring :     Orientation   Provide visual and hearing aids   Encourage communication and reorient patient   Have familiar objects from patient's home in room   Allow daytime television with daily news   Non-verbal music  Environment   Sleep hygiene: lights off at night and on during the day   Control excess noise, especially at night   Ambulate or mobilize patient as early and often as feasible    Minimize restraints (and lines and/or foleys and/or feeding tubes as able)    Medical    Minimize anticholingeric medications    Minimize polypharmacy   Minimize overnight checks/vitals to encourage restful  sleep patterns    4. Substance Abuse/Dep/Withdrawal: no issues    5. SafetyRisk Assessment  Patient presents with  no suicidal ideation, suicidal ideation without plan  No specific plan to harm self    Overall the risk: moderate for future dangerousness;   However the patient's imminent risk is low as the patient is YQM:VHQIONG to engage  sober, future oriented, adherent with medication, has outpatient psychiatric follow-up established, has improved social support.    Plan for LOW risk: (ie pt has thoughts of death but NO imminent plan/intent OR behavior) pt has numbers to outpatient clinic, social supports and National Suicide Prevention Lifeline number (1-800-273-TALK-8255), or [facility] suicide hotline (986)364-6559), PES [phone] and Mobile Crisis Team [phone]. Pt is aware to contact provider(s), go to the PES and let friends/family know that he/she is feeling worse.     6. Disposition     Patient may  require admission to inpatient psychiatry once medically stable (no O2 requirement, no lines or drains, no IV medications, vitals stable for minimum of 24hrs, appropriate mobility established  either by baseline or PT/OT evals, and outpatient medical followup plan in place)  The following need to be determined : collateral, safety plan, outpatient followup, medical stability    Collateral information has been contacted. Yes  Patient has a good safety plan in place Yes   Patient denies no access to  firearms or other weapons     Patient does not a statement of belief     Treatment plan reviewed with the patient.  Medication risks/benefit reviewed with the patient  Thank you for this consult, please call us with any further questions. We will be continue to follow at this time.    Juelz Claar BETH Debbe Bales, MD, ________________________________________________________________________    HPI: KIARIA SCHARES is a 60 y.o. White or Caucasian female with a past medical history of  has a past medical history of Anxiety; Asthma; Chronic sinusitis (05/24/2011); Environmental allergies; Esophageal reflux (08/10/2010); Fatigue (05/24/2011); Fracture of left orbit (2010); Hematoma, left eyebrow (04/18/2009); Huntington's chorea (08/11/2010); Osteopenia (08/10/2010); and Superficial injury of face without infection (04/12/2009). who presented to [facility] on 03/08/2016  with Hyponatremia    Psychiatry was consulted for  dleirium.     Interval History:    Pt is agitated today, feels worse with decrease in risperidone, pt responded well to extra 1 mg risperidone given this am  Pt aslo got 5 mg valium at 5 am    Spoke to pt's outside neuroognist and psychiatrist both of whom were in agreement with plan    Sleep: excessive worse  Anhedonia: moderate the same  Appetite : diminished the same  Energy: decreasedworse    Anxiety/Panic:  Moderate, worse  Guilt: moderate the same  Hopeless:moderatethe same  Suicidal Ideation/ Homicidal Ideation no suicidal ideation, no homicidal ideationthe same      Psychosis   Denied, Auditory, Visual, Tactile and Chemosensorythe same   Denies  paranoia, suspiciousness, hallucinations, delusional thinking and  agitationimproved    Delirium Patient is currently experiencing altered mental status as evidenced by lack of full attention and concentration.improved    Dementia:       Past Psychiatric History:    New Information : husband does not want  psych meds changed again    Substance Use History: New Information : none known    Past Medical History:   Past Medical History:   Diagnosis Date    Anxiety     Asthma     Chronic sinusitis 05/24/2011    Environmental allergies     Esophageal reflux 08/10/2010    Fatigue 05/24/2011    Fracture of left orbit 2010    Hematoma, left eyebrow 04/18/2009    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    Osteopenia 08/10/2010    Superficial injury of face without infection 04/12/2009     Past Surgical  History:   Procedure Laterality Date    SINUS SURGERY      TONSILLECTOMY         Social/Developmental History:      Social History     Social History    Marital status: Married     Spouse name: N/A    Number of children: N/A    Years of education: N/A     Occupational History    Lawyer      Social History Main Topics    Smoking status: Never Smoker    Smokeless tobacco: Never Used    Alcohol use 0.6 oz/week     1 Cans of beer per week      Comment: Occasionally    Drug use: No    Sexual activity: Yes     Partners: Male     Other Topics Concern    Caffeine Use Yes    Occupational Exposure No    Exercise Yes    Seat Belt Yes     Social History Narrative    None        Children:/Support:spouse     Family History: reviewed  Family History   Problem Relation Age of Onset    Pulmonary embolism Mother     Huntington's disease Mother 44    Alzheimer's disease Father     Depression Sister     Suicidality Sister      attemped suicide 2005    Anxiety disorder Sister     Huntington's disease Maternal Uncle         Allergies:  Allergies   Allergen Reactions    Zithromax [Azithromycin] Anaphylaxis     Other reaction(s): Other (See Comments)  abdominal pain    Bactrim  [Sulfamethoxazole-Trimethoprim] Nausea Only and Hives    Erythromycin Diarrhea    Erythromycin Base Diarrhea and Nausea And Vomiting    Methylphenidate Hives    Sulfa (Sulfonamide Antibiotics) Hives       Current Medications ordered:  Scheduled Meds:   clonazePAM  3 mg Oral Nightly (2100)    heparin (porcine)  5,000 Units Subcutaneous Q8H    lithium  450 mg Oral Nightly (2100)    mirabegron  50 mg Oral Daily 0900    pantoprazole  40 mg Oral DAILY 0600    risperiDONE  2 mg Oral Nightly (2100)    traZODone  100 mg Oral Nightly (2100)     Continuous Infusions:   PRN Meds:.      ROS:   Pt declines to answer due to fatique  Psych : see above  Comment:    OBJECTIVE:  Vitals:  Vitals:    03/14/16 1537 03/14/16 2009 03/15/16 0308 03/15/16 0945   BP: 126/55 118/86  (!) 141/92   BP Location: Left arm Left arm  Left arm   Patient Position: Lying Lying  Lying   Pulse: 83 100  97   Resp: 16 16  19    Temp: 97.9 F (36.6 C) 97.8 F (36.6 C)  97.5 F (36.4 C)   TempSrc: Oral Oral  Axillary   SpO2: 97% 99%  97%   Weight:   157 lb (71.2 kg)    Height:           Physical Exam: reviewed that done by primary team     Mental Status Exam      General     Development :normal    Body Habitus: underweight    Grooming/Hygiene : clean  Demeanor: polite and cooperative    Eye Contact:  avoidant    Speech   Rate: Slow   Volume: Quiet   Articulation:Normal   Quality: Paucity     Motor   Strength/Tone: cog wheel rigidity   Atrophy:present   Abnormal Movements: movements more agitatin today   Station:rigid/erect     Gait: needs assistance ( walker/wheelchair)      Mood/ Affect   Mood:  depressed , anxious   Affect - Range: constricted      - Reactivity: flat      - Appropriateness: Inappropriate to mood and/or situation    Thought    Content: normal   Process: normal   Associations: normal   Physical and Psychological Reality Testing : normal    Cognitive   Level of Alertness: sedated   Orientation to: person      Not oriented to  time/date   Alert and Oriented in all Spheres: no   Recent Memory: mild impairment   Remote Memory: mild impairment   Attention/Concentration/Focus: moderate impairment   Language: mild impairment   Fund of Knowledge: mild impairment    Safety   Harm to Self: no   Harm to Others: no    Insight/ Judgement   Insight:partial   Judgment: fair        Laboratory Data       Complete Blood Count:  No results for input(s): WBC, HGB, HCT, MCV, PLT in the last 72 hours.    Basic Metabolic Panel:  No results for input(s): NA, K, CL, CO2, BUN, MG, PHOS in the last 72 hours.    Invalid input(s): CRET, GLU, CA    Hepatic Panel:      Lab name 03/08/16  2227   AST 21   ALT 15   ALBUMIN 3.9           Drug Level:  Lab Results   Component Value Date    LITHIUM 0.5 (L) 03/09/2016         URINE      Lab 03/08/16  2227   COLOR, URINE Colorless*   CLARITY Clear   PROTEIN UA Negative   PH UA 6.0   SPECIFIC GRAVITY, URINE <1.005*   GLUCOSE UA Negative   BLOOD UA Negative   LEUKOCYTES UA Negative   NITRITE UA Negative   BILIRUBIN UA Negative   UROBILINOGEN UA <2.0     Lab Results   Component Value Date    COCAINSCRNUR Negative 03/08/2016    OPIATESCRNUR Negative 03/08/2016    THCSCRNUR Negative 03/08/2016    BENZOSCRNUR Negative 03/08/2016    BARBSCRNUR Negative 03/08/2016    METHADSCRNUR Negative 03/08/2016       PREGNANCY:     Delirium  No results found for: HCG    No results found for: ESR, CRP    No results found for: HIV12ABAGN                      Lab name 03/08/16  2227   ALBUMIN 3.9           Lab name 03/09/16  0407   TSH 3.14        Antipsychotic monitoring:          Other Labs:       Lab Results   Component Value Date    CHOLTOT 234 (H) 06/02/2011    TRIG 140 06/02/2011    HDL 76 (H) 06/02/2011    LDL  129 (H) 06/02/2011            Lab name 03/09/16  0407   TSH 3.14          Diagnostic Studies     No orders to display       Radiology:               EKG:  No results found for this or any previous visit.    Chart REview, Collateral  Contacted, Outside clinician contacted  Extensive records review has been done :  Outside record have been requested  Old medical records.  Nursing notes.    Patient's records were obtained and reviewed.  Outside clinician has notbeen contacted, will continue to try    Collateral information has been obtained    Signed:  Fabio Pierce, MD  03/15/2016, 12:06 PM

## 2016-03-15 NOTE — Telephone Encounter (Signed)
Pt's husband is calling to speak with someone regarding how to figure out the best thing to do for his wife and her condition with Huntington's disease. Having issues finding a rehab facility for the pt to go to after d/c fr hospital. Pls advise.

## 2016-03-15 NOTE — Progress Notes (Signed)
---   CASE MANAGEMENT NOTE ---  Received notification from the following facilities that patient can't be accepted:   [facility]   [address] [place]   [facility]   [facility]   [facility].  Updated OT and SLP notes submitted.  Awaiting PT notes.  Notified [facility] PT of need for updated notes.  [facility] will initiate pre-cert once PT notes obtained.    Discussed the above with patient's spouse, [name], who is in agreement with pursuing [facility].      Notified patient's RN, Nehemiah Settle, of the above.    --- DISCHARGE PLAN ---  [facility].  Pre-cert to be initiated.      Cleon Gustin, RN CCM  Cell [phone]

## 2016-03-15 NOTE — Progress Notes (Signed)
[facility] HOSPITALIST PROGRESS NOTE    Name: Brenda [name]    DOB: 01-08-1956   MRN: [id number]   Admit Date: 03/08/2016       Patient class : Inpatient    Assessment & Plan:    -  Gen weakness, altered mental status, d/t progression of Huntington dz, exacerbated by hyponatremia  -  Hyponatremia d/t meds, polydipsia, impulsive behavior from HD; resolved  -  Oropharyngeal dysphagia d/t HD    Appreciate consultants' assistance  Likely would benefit from outpatient f/u with usual treatment team for further medication adjustments  Continue dysphagia diet, fluid restriction  HD SW engaged for assistance with placement  Needs placement at SNF/IPR - CM following    Subjective:      More anxious today after risperidone dose decreased overnight last night.    Physical Exam:    BP 131/82 (BP Location: Left arm, Patient Position: Sitting)   Pulse 103   Temp 97.8 F (36.6 C) (Oral)   Resp 18   Ht 5' 5 (1.651 m)   Wt 157 lb (71.2 kg)   LMP 05/20/2011   SpO2 96%   BMI 26.13 kg/m   O2 Flow Rate (L/min): 0 L/min    GEN: NAD  HEENT: EOMI; no icterus or injection; mucus membranes moist  CV: RRR; no murmurs  PULM: CTAB; nonlabored  GI: normoactive bowel sounds; soft; nontender; nondistended  EXT: no edema    Labs/ Imaging:    Reviewed      Duayne Cal, MD  Pager [phone]  03/15/2016, 4:30 PM

## 2016-03-15 NOTE — Progress Notes (Signed)
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 White or Caucasian female ho presented to [facility] with Huntington chorea G10.  Pt. with mental status changes and hyponatremia.    Assessment  PT 6 Clicks  Help From Another Person Turning From Back to Side While Flat in Bed Without Using Siderails: A little  Help From Another Person Moving From Lying On Back To Sitting Without Using Siderails: A little  Help From Another Person Moving To And From Bed To Chair: A little  Help From Another Person Standing Up From Chair Using Your Arms: A little  Help From Another Person To Walk In Hospital Room: A little  Help From Another Person Climbing 3-5 Steps With A Railing: A little  PT 6 Clicks Score: 18  Assessment: Impaired Bed Mobility, Impaired Transfer Mobility, Impaired Ambulation, Impaired Balance, Impaired Strength, Impaired ROM, Impaired Safety Awareness, Deconditioning  Prognosis: Good    Goals  Pt Will Go Supine To Sit: Supervision  Sit To Stand: Supervision with RW  Pt Will Transfer Bed/Chair: Supervision (with RW)  Pt Will Ambulate: Supervision (with RW with 250')  Miscellaneous Goal #1: Patient to participate in LE HEP and balance activities to improve overall functional mobility.   Time frame for goals to be met in: 7 days (03/17/16)    Recommendation  Plan  Treatment/Interventions: LE strengthening/ROM, Neuromuscular Reeducation, Endurance training, Patient/family training, Therapeutic Activity, Equipment eval/education, Therapeutic Exercise, Gait training, Compensatory technique education, Continued evaluation, Stair Training  PT  Frequency: 3-5x/wk    Recommendation  Recommendation: Short-term skilled PT    Problem List  Patient Active Problem List   Diagnosis    Huntington's chorea    Asthma    Esophageal reflux    Osteopenia    Fatigue    Chronic sinusitis    Anxiety    Depression    Paranoia    Care involving speech-language therapy    Hoarseness of voice    Laryngopharyngeal reflux (LPR)    Cough    Allergic rhinitis    GERD (gastroesophageal reflux disease)    Anxiety and depression    Huntington's disease    Dysphagia    Needs a medical hold    Hyponatremia        Past Medical History  Past Medical History:   Diagnosis Date    Anxiety     Asthma     Chronic sinusitis 05/24/2011    Environmental allergies     Esophageal reflux 08/10/2010    Fatigue 05/24/2011    Fracture of left orbit 2010    Hematoma, left eyebrow 04/18/2009    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    Osteopenia 08/10/2010    Superficial injury of face without infection 04/12/2009        Past Surgical History  Past Surgical History:   Procedure Laterality Date    SINUS SURGERY      TONSILLECTOMY  Cognition:  Arousal/Alertness: Appropriate responses to stimuli  Orientation Level: Oriented to person;Oriented to place;Oriented to time  Following Commands: Follows one step commands  Safety Judgment: Decreased awareness of need for safety     Pain:  Pain Score: 0-No pain     Mobility:  Bed Mobility  Sit to Supine: Supervision  Transfers  Sit to Stand: Contact Guard (with RW; cues for safe hand placement) x 2 reps  Gait  Pattern: Shuffle;R Decreased stance time;L Decreased stance time (flexed over RW, continuous gait pattern, impulsive)  Gait Assistance: Minimal  Assistive Device: Rolling walker  Distance: 40'-Limited gait distance as pt is VERY lethargic during gait. pt with B eyes closed during more than 50% of gait. unsafe to attempt further gait. Verbal cuing to slow down and TAKE HER TIME./ remain within the RW at all times  Sitting Balance:  CGA  Standing Balance: Minimal assistance      Exercise:  Not attempted: pt lethargic pulling covers over herself at end of therapy session stating im so tired.       Patient Education:  1. PT POC Education: Pt educated on role/POC of PT, fall risk prevention, discharge planning,and safety throughout functional mobility.       Handoff of Care:  Safety Handoff completed with RN, Nehemiah Settle after Rehabilitation session.      Position at End of Therapy Session:  Patient in bed with call light/needs in reach and bed alarm activated. Educated patient to utilize call button to ring for assist as needed and to ALWAYS ring for assist for functional transfers.  Educated patient NOT to attempt transfers without staff assist.   Bed Alarm set/activated.        Time  Start Time: 1529  Stop Time: 1539  Time Calculation (min): 10 min    Charges  $Gait/Mobility: 8-22 mins                  Joline Salt PT,DPT   Physical Therapist, License #: [id number]  ASCOM # [phone]  03/15/2016 3:49 PM

## 2016-03-15 NOTE — Progress Notes (Signed)
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 She was accompanied by family members who stated shes having a bad day as well as reported she just drank her milk  Patient's lunch tray 100% consumed. She was able to independently state safe swallow precautions including small drinks and chin tuck in response to clinician.    Speech Therapy/Treatment was not completed at this time.    [address] MS, CCC-SLP  Ascom: [phone]  03/15/2016

## 2016-03-15 NOTE — Telephone Encounter (Signed)
Called Brenda [name] back and relayed message per Dr. Gerilyn Pilgrim.  She will contact [name] with any other questions.  I advised her I was not able to give any information since her name is not on the chart.

## 2016-03-15 NOTE — Telephone Encounter (Signed)
Was admitted to [facility].  DX psychogenic polydipsea.  Looking for a rehab facility that takes Huntingtons patients.  Psychiatrist has changed her HD medications.  Decreased her Risperidon by 1/3  Spoke with Mardella Layman- PT- -Ann's sister [phone] (in [place])

## 2016-03-15 NOTE — Progress Notes (Signed)
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 White or Caucasian female ho presented to [facility] with Huntington chorea G10.  Pt. with mental status changes and hyponatremia.      Assessment  OT 6 Clicks  Help From Another Person To Put On/Take Off Regular Lower Body Clothing: A lot  Help From Another Person Bathing ( including washing ,rinsing,drying): A lot  Help From Another Person Toileting, which includes using the toilet, bedpan or urinal: A little  Help From Another Person To Put On/Take Off Regular Upper Body Clothing: A little  Help From Another Person Taking Care Of Personal Grooming: A little  Help From Another Person Eating Meals: A little  OT 6 Clicks Score: 16  Assessment: Decreased activity tolerance, Decreased ADL status, Decreased high-level ADLs, Decreased Functional Mobility, Decreased Balance, Decreased self-care trans, Decreased cognition, Decreased Safe judgement during ADL, Decreased fine motor control, Decreased UE strength, Decreased UE ROM  Prognosis: Good  Goal Formulation: Patient    Goals  Pt Will demonstrate supine to sit to prep for ADLs: Modified Independent (goal met 03/15/16)  Pt Will demonstrate functional chair transfer: Stand by assist  Pt Will demonstrate toilet transfer: Stand by assist (goal met with CGA 03/15/16; update)  Pt Will complete toileting with: Contact Guard (goal met 03/15/16)  Pt Will demonstrate feeding task: Stand by assist  Pt Will demonstrate grooming task: Contact Guard (goal met 03/15/16)  Pt  Will demonstrate UE ADLs: Minimal  Pt Will demonstrate LE ADLs: Contact Guard (goal met with minA 03/15/16; update)  Time frame for  goals to be met in: 7 days 03/17/16    Recommendation  Plan  Treatment Interventions: ADL retraining, Lower Extremity Intervention, Therapeutic Activity, Activity Tolerance training, Cognitive reorientation, Functional transfer training, Energy Conservation, Patient/Family training, Equipment eval/education, Compensatory technique education  Progress: Improving as expected  OT Frequency: 3-5x/wk  Recommendation  Recommendation: Short-term skilled OT  Equipment Recommended: Defer at this time      Problem List  Patient Active Problem List   Diagnosis    Huntington's chorea    Asthma    Esophageal reflux    Osteopenia    Fatigue    Chronic sinusitis    Anxiety    Depression    Paranoia    Care involving speech-language therapy    Hoarseness of voice    Laryngopharyngeal reflux (LPR)    Cough    Allergic rhinitis    GERD (gastroesophageal reflux disease)    Anxiety and depression    Huntington's disease    Dysphagia    Needs a medical hold    Hyponatremia        Past Medical History  Past Medical History:   Diagnosis Date    Anxiety     Asthma     Chronic sinusitis 05/24/2011    Environmental allergies     Esophageal reflux 08/10/2010    Fatigue 05/24/2011    Fracture of left orbit 2010    Hematoma, left eyebrow 04/18/2009    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    Osteopenia 08/10/2010    Superficial injury of face without infection 04/12/2009        Past  Surgical History  Past Surgical History:   Procedure Laterality Date    SINUS SURGERY      TONSILLECTOMY         Cognition  Overall Cognitive Status: Impaired  Arousal/Alertness: Appropriate responses to stimuli  Attention Span: Difficulty attending to directions  Memory: Decreased recall of biographical information  Orientation Level: Oriented to person;Oriented to place;Oriented to time  Following Commands: Follows one step commands  Safety Judgment: Decreased awareness of need for safety  Awareness of Errors: Assistance required to identify  errors made  Insight: Decreased awareness of deficits  Problem Solving: Assistance required to identify errors made    Pain  Pain Score: 0-No pain     Mobility  Bed Mobility  Supine to Sit: Modified independent (Device) (use of bed rails)  Sit to Supine: Modified independent (Device) (use of bed rails)  Functional Transfers  Sit to Stand: Contact Guard (with RW; cues for safe hand placement)  Toilet Transfers: Contact guard assist (cues for safety )  Functional Mobility: Minimal (with RW; cues for safe placement with RW / slow down )  *max verbal cues for safety with RW and to keep RW with her at all times    ADL  Where Assessed: Standing at sink  Grooming Assistance: Contact guard assist  Grooming Deficit: Wash/dry hands;Wash/dry face;Teeth care  LE Dressing Assistance: Minimal  LE Dressing Deficit: Thread LLE into underwear;Thread RLE into underwear  Toileting Assistance with Device: Contact guard assist    Patient Education  role of OT, safety with functional mobility, ADLs, D/C plan, safety with RW.   Pt verbalized and demonstrated understanding of provided education.    Position at End of Session:  Patient was left in bed at the end of session with bed alarm on, call light and all needs in reach.      Handoff of Care:  Safety Handoff completed with RN after Rehabilitation session.       Time  Start Time: 1135  Stop Time: 1152  Time Calculation (min): 17 min    Charges       $Self Care/ADL/Home Management Training: 8-22 mins       Randa Evens, OTR/L   ASCOM # [phone]   03/15/2016

## 2016-03-16 MED ORDER — traZODone (DESYREL) 100 MG tablet
100 | ORAL_TABLET | Freq: Every evening | ORAL | 0 refills | Status: AC
Start: 2016-03-16 — End: 2016-09-01

## 2016-03-16 MED ORDER — clonazePAM (KLONOPIN) 1 MG tablet
1 | ORAL_TABLET | Freq: Every evening | ORAL | 0 refills | Status: AC
Start: 2016-03-16 — End: 2016-05-25

## 2016-03-16 MED ORDER — clonazePAM (KLONOPIN) 0.5 MG tablet
0.5 | ORAL_TABLET | Freq: Two times a day (BID) | ORAL | 0 refills | Status: AC | PRN
Start: 2016-03-16 — End: 2016-05-25

## 2016-03-16 MED FILL — HEPARIN (PORCINE) 5,000 UNIT/ML INJECTION SOLUTION: 5000 5,000 unit/mL | INTRAMUSCULAR | Qty: 1

## 2016-03-16 MED FILL — PANTOPRAZOLE 40 MG TABLET,DELAYED RELEASE: 40 40 MG | ORAL | Qty: 1

## 2016-03-16 MED FILL — CLONAZEPAM 0.5 MG TABLET: 0.5 0.5 MG | ORAL | Qty: 1

## 2016-03-16 NOTE — Progress Notes (Signed)
Physical Therapy  Discharge Report     Patient Identification  Brenda [name] is a 60 y.o. female.  DOB:  Nov 27, 1955  Admit Date:  03/08/2016  Discharge date and time: 03/16/2016  4:31 PM   Attending Provider: No att. 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       pt [facility] from [facility] to SNF .This will serve as the discharge summary.    No goals met at this time secondary to brevity of treatment sessions/ decreased overall activity tolerance/ general fatigue.    Goals  Pt Will Go Supine To Sit: Supervision  Sit To Stand: Supervision with RW  Pt Will Transfer Bed/Chair: Supervision (with RW)  Pt Will Ambulate: Supervision (with RW with 250')  Miscellaneous Goal #1: Patient to participate in LE HEP and balance activities to improve overall functional mobility.   Time frame for goals to be met in: 7 days (03/17/16)    Equipment issued No.    Pt educated on role/POC of PT, fall risk prevention, discharge planning, WB status, gait/transfer training, and safety throughout functional mobility.       Patient response to education fair     See last PT note for current functional status at time if discharge      Joline Salt PT,DPT   Physical Therapist, License #: [id number]  Pager [phone]  03/17/2016 7:03 AM

## 2016-03-16 NOTE — Progress Notes (Signed)
Case Management Note  CM did speak to nurse from [facility] ) seeking more information of skilled care for MD. CM did review PT notes from 03/15/16. Per Bre ( CM office ), she did review PT/OT notes from today. Will follow.

## 2016-03-16 NOTE — Unmapped (Signed)
Altered Mental Status  Altered mental status most often refers to an abnormal change in your responsiveness and awareness. It can affect your speech, thought, mobility, memory, attention span, or alertness. It can range from slight confusion to complete unresponsiveness (coma). Altered mental status can be a sign of a serious underlying medical condition. Rapid evaluation and medical treatment is necessary for patients having an altered mental status.  CAUSES   ?? Low blood sugar (hypoglycemia) or diabetes.  ?? Severe loss of body fluids (dehydration) or a body salt (electrolyte) imbalance.  ?? A stroke or other neurologic problem, such as dementia or delirium.  ?? A head injury or tumor.  ?? A drug or alcohol overdose.  ?? Exposure to toxins or poisons.  ?? Depression, anxiety, and stress.  ?? A low oxygen level (hypoxia).  ?? An infection.  ?? Blood loss.  ?? Twitching or shaking (seizure).  ?? Heart problems, such as heart attack or heart rhythm problems (arrhythmias).  ?? A body temperature that is too low or too high (hypothermia or hyperthermia).  DIAGNOSIS   A diagnosis is based on your history, symptoms, physical and neurologic examinations, and diagnostic tests. Diagnostic tests may include:  ?? Measurement of your blood pressure, pulse, breathing, and oxygen levels (vital signs).  ?? Blood tests.  ?? Urine tests.  ?? X-ray exams.  ?? A computerized magnetic scan (magnetic resonance imaging, MRI).  ?? A computerized X-ray scan (computed tomography, CT scan).  TREATMENT   Treatment will depend on the cause. Treatment may include:  ?? Management of an underlying medical or mental health condition.  ?? Critical care or support in the hospital.  HOME CARE INSTRUCTIONS   ?? Only take over-the-counter or prescription medicines for pain, discomfort, or fever as directed by your caregiver.  ?? Manage underlying conditions as directed by your caregiver.  ?? Eat a healthy, well-balanced diet to maintain strength.  ?? Join a support group or  prevention program to cope with the condition or trauma that caused the altered mental status. Ask your caregiver to help choose a program that works for you.  ?? Follow up with your caregiver for further examination, therapy, or testing as directed.  SEEK MEDICAL CARE IF:   ?? You feel unwell or have chills.  ?? You or your family notice a change in your behavior or your alertness.  ?? You have trouble following your caregiver's treatment plan.  ?? You have questions or concerns.  SEEK IMMEDIATE MEDICAL CARE IF:   ?? You have a rapid heartbeat or have chest pain.  ?? You have difficulty breathing.  ?? You have a fever.  ?? You have a headache with a stiff neck.  ?? You cough up blood.  ?? You have blood in your urine or stool.  ?? You have severe agitation or confusion.  MAKE SURE YOU:   ?? Understand these instructions.  ?? Will watch your condition.  ?? Will get help right away if you are not doing well or get worse.     This information is not intended to replace advice given to you by your health care provider. Make sure you discuss any questions you have with your health care provider.     Document Released: 12/09/2009 Document Revised: 09/13/2011 Document Reviewed: 08/15/2014  Elsevier Interactive Patient Education ??2016 Elsevier Inc[employer]

## 2016-03-16 NOTE — Progress Notes (Signed)
Speech Language Pathology    Patient continues to be at risk for aspiration.  Recommend continue with a Dysphagia 3 diet and honey thick liquids.      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  She required maximum verbal encouragement to participate in therapy.  Patient is at risk for aspiration, and is on a dysphagia 3 diet and honey thick liquids.  Patient had no complaints of pain, but frequently stated being tired.     Objective   The following goals were addressed in treatment:  Goals Short Term Dysphagia  Patient will demonstrate use of swallowing exercises: by 03/16/16:  Patient instructed on use of Masako Maneuver/Tongue hold maneuver for pharyngeal exercise.  Patient required SLP to physically hold tongue in position as she was unable to independently hold tongue in anterior position for exercise.  With physical assistance, patient completed Masako maneuver 10/10 with moderate effort.  Lingual exercises were next completed 10/10 with approximately 60% accuracy.  Patient requires moderate-maximum cueing and physical assistance to complete exercises.  Goal not met.  Update goal for one more week, 03/23/16   Patient and/or caregiver will demonstrate ability to prepare food and liquid consistencies at 80% accuracy: by 03/16/16:  Patient was re-educated on importance of thickened liquids to reduce the risk of aspiration.  She reports all liquids have been thickened.  Patient not responsible at this time for thickening own liquids, and discharge plan is for her to go to rehab where she will again not be responsible for preparing own diet.  Will discontinue goal as not appropriate at this time,  however will be appropriate prior to discharge home when patient and/or husband are responsible for meal/liquid preparation.  Discontinue goal.  Patient and/or caregiver will demonstrate understanding of clinical signs and symptoms of aspiration at 80% accuracy: by 03/16/16:  Patient was educated on overt signs/symptoms aspiration.  She stated, I could aspirate in relation to meaning of these symptoms.  Goal met.   Patient will demonstrate use of compensatory strategy: by 03/16/16 with moderate cues:  Patient was re-educated on use of chin tuck and throat clear after the swallow.  She took honey thick liquids, and independently utilized strategies 50%.  With verbal and visual cues, patient was able to utilize 100%.  Goal met, however as patient continues to require cueing for implementation, will therefore extend goal one more week to 03/23/16.  Patient will demonstrate safe swallowing behaviors: by 03/16/16 with moderate cues:  Patient able to exhibit use of safe swallowing behaviors (upright for intake, small sips, chin tuck, and throat clear after the swallow) approximately 50% without cues, 100% with cues.  Goal met, however will continue for one more week 03/23/16 for further reinforcement of strategies to reduce the risk of aspiration.  Goals Long Term Dysphagia  Patient will tolerate least restrictive diet with no signs or symptoms of aspiration : by 03/23/16:  Patient reports just finishing breakfast, and was therefore willing to only drink honey thick liquids.  Patient had no complaints of thickened liquids, and drank them willingly.  There were no overt signs/symptoms aspiration on honey thick liquids via cup with use of a chin tuck and throat clear after the swallow.  No cough nor throat clearing, and vocal quality remained clear.  Patient continues to be at risk for aspiration secondary to previous MBSS results, impulsivity, and need for verbal cueing to effectively utilize safe swallowing strategies.   Goal met, however will continue goal for ongoing assessment secondary to risks of aspiration.      Time frame for goal to be met in: 3-7 x week x 2 week by 03/23/16  Education:  Patient was educated on the following: diet recommendations, upright for all oral intake, small bites/small sips, chin tuck when swallowing, throat clear after the swallow, overt signs/symptoms aspiration, and risks of aspiration.  Patient verbalized understanding, however will require reinforcement.  Husband was present during education, however had no response.     Assessment  Patient required maximum verbal encouragement for participation.  She remains at high risk for aspiration, and requires moderate-maximum verbal cueing for use of safe swallowing strategies and exercises. Prognosis for improvement is fair-guarded.  Plan  Continue Speech Therapy for dysphagia with updated goals as above.  Recommend continued Speech Therapy at discharge.  Patient was left semi-reclined in bed with call light within reach, husband present, and all needs met.  Safety handoff completed with RN/Mallory who verbalized understanding.      Time  Start Time: 1035  Stop Time: 1051  Time Calculation (min): 16 min    Charges   $Swallow Tx : 1 Procedure    Ramond Darnell MACCC/SLP  03/16/2016  Ascom #[phone]

## 2016-03-16 NOTE — Progress Notes (Signed)
Occupational Therapy  Discharge Report     Patient Identification  Brenda [name] is a 60 y.o. female.  DOB:  03-07-1956  Admit Date:  03/08/2016  Discharge date and time: 03/16/2016  4:31 PM   Attending Provider: No att. 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  Initial Evaluation completed on 03/10/16.     Met 5 out of 8 goals.    Goals that were not met were due to pt. With ongoing medical issues.    Equipment issued No.    Patient educated about role of OT/POC, functional transfers, and safety with ADLs/IADLs.    Patient response to education fair     See last OT note for current functional status at time if discharge.    Darene Lamer, OTR/L   License # [id number]  Ascom:  [phone]  03/17/2016

## 2016-03-16 NOTE — Progress Notes (Signed)
[place] HOSPITALIST PROGRESS NOTE    Name: Brenda [name]    DOB: 1956/06/20   MRN: [id number]   Admit Date: 03/08/2016       Patient class : Inpatient    Assessment & Plan:    -  Gen weakness, altered mental status, d/t progression of Huntington dz, exacerbated by hyponatremia  -  Hyponatremia d/t meds, polydipsia, impulsive behavior from HD; resolved  -  Oropharyngeal dysphagia d/t HD    Appreciate consultants' assistance  Likely would benefit from outpatient f/u with usual treatment team for further medication adjustments  Continue dysphagia diet, fluid restriction  HD SW engaged for assistance with placement  Needs placement at SNF/IPR - CM following, precert pending    Subjective:      C/o increased sedation after resuming usual dose of risperidone. No new events.    Physical Exam:    BP 117/64 (BP Location: Left arm, Patient Position: Lying)   Pulse 102   Temp 98.3 F (36.8 C) (Oral)   Resp 16   Ht 5' 5 (1.651 m)   Wt 149 lb (67.6 kg)   LMP 05/20/2011   SpO2 95%   BMI 24.79 kg/m   O2 Flow Rate (L/min): 0 L/min    GEN: NAD  HEENT: EOMI; no icterus or injection; mucus membranes moist  CV: RRR; no murmurs  PULM: CTAB; nonlabored  GI: normoactive bowel sounds; soft; nontender; nondistended  EXT: no edema    Labs/ Imaging:    Reviewed      Duayne Cal, MD  Pager [phone]  03/16/2016, 1:34 PM

## 2016-03-16 NOTE — Progress Notes (Signed)
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 White or Caucasian female ho presented to [facility] with Huntington chorea G10.  Pt. with mental status changes and hyponatremia.      Assessment  OT 6 Clicks  Help From Another Person To Put On/Take Off Regular Lower Body Clothing: A lot  Help From Another Person Bathing ( including washing ,rinsing,drying): A lot  Help From Another Person Toileting, which includes using the toilet, bedpan or urinal: A little  Help From Another Person To Put On/Take Off Regular Upper Body Clothing: A little  Help From Another Person Taking Care Of Personal Grooming: A little  Help From Another Person Eating Meals: A little  OT 6 Clicks Score: 16  Assessment: Decreased activity tolerance, Decreased ADL status, Decreased high-level ADLs, Decreased Functional Mobility, Decreased Balance, Decreased self-care trans, Decreased cognition, Decreased Safe judgement during ADL, Decreased fine motor control, Decreased UE strength, Decreased UE ROM  Prognosis: Good  Goal Formulation: Patient    Goals  Pt Will demonstrate supine to sit to prep for ADLs: Modified Independent (goal met 03/15/16)  Pt Will demonstrate functional chair transfer: Stand by assist  Pt Will demonstrate toilet transfer: Stand by assist (goal met with CGA 03/15/16; update)  Pt Will complete toileting with: Contact Guard (goal met 03/15/16)  Pt Will demonstrate feeding task: Stand by assist  Pt Will demonstrate grooming task: Contact Guard (goal met 03/15/16)  Pt  Will demonstrate UE ADLs: Minimal  Pt Will demonstrate LE ADLs: Contact Guard (goal met with minA 03/15/16; update)  Time frame  for goals to be met in: 7 days 03/17/16    Recommendation  Plan  Treatment Interventions: ADL retraining, Lower Extremity Intervention, Therapeutic Activity, Activity Tolerance training, Cognitive reorientation, Functional transfer training, Energy Conservation, Patient/Family training, Equipment eval/education, Compensatory technique education  Progress: Improving as expected  OT Frequency: 3-5x/wk  Recommendation  Recommendation: Short-term skilled OT  Equipment Recommended: Defer at this time      Problem List  Patient Active Problem List   Diagnosis    Huntington's chorea    Asthma    Esophageal reflux    Osteopenia    Fatigue    Chronic sinusitis    Anxiety    Depression    Paranoia    Care involving speech-language therapy    Hoarseness of voice    Laryngopharyngeal reflux (LPR)    Cough    Allergic rhinitis    GERD (gastroesophageal reflux disease)    Anxiety and depression    Huntington's disease    Dysphagia    Needs a medical hold    Hyponatremia        Past Medical History  Past Medical History:   Diagnosis Date    Anxiety     Asthma     Chronic sinusitis 05/24/2011    Environmental allergies     Esophageal reflux 08/10/2010    Fatigue 05/24/2011    Fracture of left orbit 2010    Hematoma, left eyebrow 04/18/2009    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    Osteopenia 08/10/2010    Superficial injury of face without infection 04/12/2009        Past  Surgical History  Past Surgical History:   Procedure Laterality Date    SINUS SURGERY      TONSILLECTOMY         Cognition  Overall Cognitive Status: Impaired  Arousal/Alertness: Delayed responses to stimuli  Attention Span: Difficulty attending to directions  Memory: Decreased recall of biographical information  Orientation Level: Oriented X4  Following Commands: Follows one step commands  Safety Judgment: Decreased awareness of need for safety  Awareness of Errors: Assistance required to identify errors made  Insight: Decreased awareness of  deficits  Problem Solving: Assistance required to identify errors made    Pain  Pain Score: 0-No pain     Mobility  Bed Mobility  Rolling: Supervision  Supine to Sit: Supervision  Sit to Supine: Supervision  Functional Transfers  Sit to Stand: Stand by assist  Toilet Transfers: Contact guard assist (on regular commode)  Functional Mobility: Contact guard assist (without assistive device)  Balance  Sitting - Static: Good  Sitting - Dynamic: Good  Standing - Static: Fair  Standing - Dynamic: Fair (without bilateral UE support)        ADL  Where Assessed: Standing at sink  Grooming Assistance: Contact guard assist (to wash hands, brush teeth, cues for sequencing tasks)  LE Dressing Assistance: Contact guard assist  LE Dressing Deficit: Don/doff R sock;Don/doff L sock  Toileting Assistance with Device: Stand by    Patient Education  Pt. Educated on role of OT/POC, functional transfers, and safety with ADLs/IADLs.  Pt. Demo fair understanding of education.    Pt. Supine in bed with needs in reach and bed alarm activated.    Handoff of Care:  Safety Handoff completed with RN/PCA after Rehabilitation session.       Time  Start Time: 1409  Stop Time: 1424  Time Calculation (min): 15 min    Charges       $Self Care/ADL/Home Management Training: 8-22 mins       Darene Lamer, OTR/L   License # [id number]  Ascom:  [phone]  03/16/2016

## 2016-03-16 NOTE — Other (Signed)
[facility]    Case Management  Discharge Summary     Patient name: Brenda [name]                                        Patient MRN: [id number]  DOB: 1956-05-09                              Age: 60 y.o.              Gender: female  Patient emergency contact: Extended Emergency Contact Information  Primary Emergency Contact: Harbor,Michael T  Address: [address]           [place], [place] [address] [place]  Home Phone: [phone]  Work Phone: [phone]  Relation: Spouse      Attending provider: Glade Stanford,*  Primary care physician: Dannielle Burn, MD    The MD has indicated that the patient is ready for discharge.  [name] was referred and accepted at [facility]. CM did receive a call from [facility] from [facility] and patient was approved to go to SNF. CM did speak to Carney Bern at [facility] and she verified that patient was approved. CM did update patient's spouse and Nurse Mallory. The report number is [phone].  The patient will be transported by spouse this afternoon. COC was faxed via [employer].               COC and scripts for Klonopin ( for bedtime, and PRN ) and for trazadone. have been faxed to facility at [phone]. Copies of scripts placed in chart.    The plan has been reviewed: yes                                    No further CM needs.    This plan has been reviewed with the multi-disciplinary team.

## 2016-03-16 NOTE — Progress Notes (Signed)
Speech Language Pathology  Discharge Report     Patient Identification  Brenda [name] is a 60 y.o. female.  DOB:  1955-09-28  Admit Date:  03/08/2016  Discharge date and time: 03/16/2016  4:31 PM   Attending Provider: No att. 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     Met 3 out of 5 goals.    Goals that were not met were due to patient being unable to independently utilize swallowing exercises, and one goal was discontinued as no longer appropriate.    Equipment issued No.    Patient educated about swallowing exercises, diet recommendations, overt signs/symptoms aspiration, risks of aspiration, and safe swallowing startegies.    Patient response to education fair     See last SLP note for current functional status at time if discharge       Casimiro Lienhard MACCC/SLP  03/17/2016  Ascom #[phone]

## 2016-03-16 NOTE — Progress Notes (Signed)
Case Management Note  Spouse instructed this CM that he had [facility] on his cell and they would like to speak to a CM. CM did speak to Melissa I 03/16/16  At 11:57 am [place] Standard Time ( contact information ) Melissa stated that [facility] does not have a SNF referral for patient. CM instructed Melissa per allscripts that  [facility] Pert had submitted referral. Melissa did transfer CM to there pre cert department. CM did speak to Racheal Patches ( ? ) and the case was built and the Pending Referral # is [id number]. CM did speak to Carney Bern ( admissions ) at [facility] and she stated that she did submit referral yesterday evening and has confirmation receipt. CM did provide Carney Bern [phone] ) with Pending Referral number and asked her to re fax clinical information to [phone] and to include the reference number on fax. For further assistance, may contact [facility] CM Alroy Bailiff at [phone]. CM did speak to [facility] patient advocate Wynona Canes and she did speak to spouse concerning discharge plan. CM will continue to follow for discharge planning. CM did speak to spouse concerning the plan if [facility] denies SNF. Spouse stated that he would appeal the denial.

## 2016-03-16 NOTE — Other (Signed)
CONTINUITY OF CARE FORM     Patient name: Brenda [name]  Patient MRN: [id number]  DOB: 1956/01/03  Age: 60 y.o.  Gender: female    Date of admission: 03/08/2016  Date of discharge: 03/16/2016    Attending provider: Glade Stanford,*  Primary care physician: Dannielle Burn, MD    Code status: Full Code  Allergies:   Allergies   Allergen Reactions    Zithromax [Azithromycin] Anaphylaxis     Other reaction(s): Other (See Comments)  abdominal pain    Bactrim [Sulfamethoxazole-Trimethoprim] Nausea Only and Hives    Erythromycin Diarrhea    Erythromycin Base Diarrhea and Nausea And Vomiting    Methylphenidate Hives    Sulfa (Sulfonamide Antibiotics) Hives       Diagnoses Present on Admission   Primary Diagnosis: Hyponatremia  Discharge Diagnosis :   Active Hospital Problems    Diagnosis Date Noted    Hyponatremia [E87.1] 03/09/2016    Needs a medical hold [Z13.89] 03/09/2016      Resolved Hospital Problems    Diagnosis Date Noted Date Resolved   No resolved problems to display.     Prognosis: fair  Rehabilitation potential: fair    Diet     Diet Orders          Diet dysphagia 3 Honey Thick starting at 09/05 1458        Dysphagia Assessment and Recommendations (when available): Risk for Aspiration: Severe  Aspiration Risk Recommendations: Dysphagia treatment  Dysphagia Diagnosis: Mild oral stage dysphagia, Moderate to severe pharyngeal stage dysphagia  Compensatory Swallowing Strategies: Upright as possible for all oral intake, Remain upright for 20-30 minutes after meals, Full supervision with meals, External pacing, No straws, Chin tuck, Small bites/sips, Eat/feed slowly  Diet Solids Recommendation: Mechanical Soft  Diet Liquids Recommendations: Honey thick only  Recommended Form of Meds: With puree (with pureed or honey thick liquids)  Dysphagia Diet Recommended (when available): Diet Solids Recommendation: Mechanical Soft  Diet Liquids Recommendations: Honey thick only    As listed above    Services Required    Skilled Nursing: Yes    PT Interventions and Frequency: Treatment/Interventions: LE strengthening/ROM, Neuromuscular Reeducation, Endurance training, Patient/family training, Therapeutic Activity, Equipment eval/education, Therapeutic Exercise, Gait training, Compensatory technique education, Continued evaluation, Stair Training  PT Frequency: 3-5x/wk    PT Recommendations: Recommendation: Short-term skilled PT    OT Interventions and Frequency: Treatment Interventions: ADL retraining, Lower Extremity Intervention, Therapeutic Activity, Activity Tolerance training, Cognitive reorientation, Functional transfer training, Energy Conservation, Patient/Family training, Equipment eval/education, Compensatory technique education  Progress: Improving as expected  OT Frequency: 3-5x/wk    OT Recommendations: Recommendation: Short-term skilled OT  Equipment Recommended: Defer at this time    Weight bearing status:  full    Bedside Swallow Recommendations (when available): Recommendation: Recommend SLP therapy at discharge  Duration of Treatment: 3-7 x week x 2 weeks by 03/23/16  Follow up treatments: Patient/Family education, Diet tolerance monitoring, Laryngeal strenghtening  Dysphagia Goals: Patient will demonstrate appropriate strategies for swallowing safety    Speech Language Recommendations (when available):      Needs 24 hour supervision due to cognitive impairment: No    Discharge Medications   Medications:     Medication List      TAKE these medication, which have CHANGED      Quantity/Refills   * clonazePAM 1 MG tablet  Commonly known as:  klonoPIN  Take 3 tablets (3 mg total) by mouth at bedtime.  What changed:  how  to take this   Quantity:  12 tablet  Refills:  0     * clonazePAM 0.5 MG tablet  Commonly known as:  klonoPIN  Take 1 tablet (0.5 mg total) by mouth 2 times a day as needed (agitation , must be at least one hour since scheduled or prn clonazepam dose given).  What changed:  You were already taking a  medication with the same name, and this prescription was added. Make sure you understand how and when to take each.   Quantity:  10 tablet  Refills:  0        * This list has 2 medication(s) that are the same as other medications prescribed for you. Read the directions carefully, and ask your doctor or other care provider to review them with you.            TAKE these medications, which you were ALREADY TAKING      Quantity/Refills   cholecalciferol (vitamin D3) 1000 units tablet  Take 1,000 Units by mouth daily.   Refills:  0     lithium 450 MG ER tablet  Commonly known as:  ESKALITH  Take 450 mg by mouth At bedtime.   Refills:  0     mirabegron 50 mg Tb24  Commonly known as:  MYRBETRIQ  Take 50 mg by mouth daily. Indications: Bladder Hyperactivity   For:  Bladder Hyperactivity  Refills:  0     multivitamin tablet  Commonly known as:  THERAGRAN  Take 1 tablet by mouth daily.   Refills:  0     omeprazole 20 mg Tbec  Take 40 mg by mouth daily.   Refills:  0     risperiDONE 1 MG tablet  Commonly known as:  RISPERDAL  Take 3 mg by mouth at bedtime.   Refills:  0     traZODone 100 MG tablet  Commonly known as:  DESYREL  Take 1 tablet (100 mg total) by mouth at bedtime.   Quantity:  5 tablet  For:  SLEEP  Refills:  0           Where to Get Your Medications      You can get these medications from any pharmacy    Bring a paper prescription for each of these medications   clonazePAM 0.5 MG tablet   clonazePAM 1 MG tablet   traZODone 100 MG tablet               Discharge Specific Orders   Discharge specific orders:  None required    Isolation         Physician Certification of Medically Necessary Transportation   Type and reason for transportation: Wheelchair - patient has difficulty walking.  Reason for transport to another facility: Rehabilitation  Patient requires: Continuous medical supervision enroute    Follow-up Appointments and [facility] Discharge Physician Name   Future Appointments  Date Time Provider  Department Center   05/25/2016 11:30 AM Patrecia Pour, MD [facility] NEUR [facility]     No follow-up provider specified.  SNF to call their PCP for an appointment upon discharge.  No Follow-up on file.    Physician Signature and Credentials   I certify that I have reviewed the information contained herein, and that the information is a true and accurate reflection of the individual's condition.    Discharging Physician: Electronically signed by Glade Stanford, MD  03/16/2016, 2:01 PM    SOCIAL WORK DOCUMENTATION  Facility/Agency Name:      Number to call report:      Level of Care at Discharge:             Less than 30 day convalescent stay:      PASARR/HENS 7000 Completed:      Family Member Name and Relationship Notified at Discharge:      Family Contact Number:      Social Worker Name and Telephone Number:       or  Discharge Planner Name and Telephone Number:      NURSE DISCHARGE ASSESSMENT   Vitals:  Patient Vitals for the past 4 hrs:   BP Temp Temp src Pulse Resp SpO2   03/16/16 1100 117/64 98.3 F (36.8 C) Oral 102 16 95 %        Orientation:       Orientation Level: Oriented X4  Patient Behaviors: Cooperative    Respiratory:  Respiratory (WDL): Exceptions to WDL  Respiratory Pattern: Regular, Easy, Unlabored  Chest Assessment: Chest expansion symmetrical  Bilateral Breath Sounds: Diminished, Clear  R Breath Sounds: Diminished, Clear  L Breath Sounds: Diminished, Clear      Cardiac:  Cardiac (WDL): Within Defined Limits    Edema:  Peripheral Vascular (WDL): Within Defined Limits    Wounds:       Comfort/Mattress:       Musculoskeletal:  Musculoskeletal (WDL): Within Defined Limits  LUE: Full movement  RUE: Full movement  RLE: Full movement  LLE: Full movement    GI:  Gastrointestinal (WDL): Within Defined Limits  Last BM Date: 03/15/16  Bowel Incontinence: Yes  Stool Source: Rectum    GU:  Genitourinary (WDL): Within Defined Limits  Genitourinary Symptoms: None  Urinary Incontinence: No  Urine Source:  Urethra    Lines and Drains:  Patient Lines/Drains/Airways Status    Active Line / PIV Line     Name:   Placement date:   Placement time:   Site:   Days:    Peripheral IV 03/16/16 Right Antecubital  03/16/16    0715    Antecubital    less than 1                ADL's:  Level of Assistance: Standby assist, set-up cues, supervision of patient - no hands on  Feeding: Able to feed self  Level of Assistance: Minimal assist    Morse Fall Risk  Morse Fall Risk Score: (!) 100    Restraints:       RN to RN Handoff:  RN Giving Report:: Sport and exercise psychologist Receiving Report:: Para March  Reason for Handoff: Report given to next shift RN, Shift Change      Nurse and Credentials   RN Handoff Completed byNehemiah Settle on 03/16/2016

## 2016-03-16 NOTE — Progress Notes (Signed)
D: Discharge order received. Patient ready to discharge.   A: IV removed with no difficulties. Discharge summary reviewed with patient and husband. Report called to [facility].   R: Husband to transport patient. Patient wheeled to car safely.

## 2016-03-16 NOTE — Discharge Summary (Signed)
[facility]ist [facility]  Discharge Summary      PATIENT INFORMATION   Patient's PCP:  Dannielle Burn, MD  Name: Brenda [name]  DOB: 11/12/1955  MRN: [id number]     Admit Date:  03/08/2016  Discharge Date:  03/16/2016  Patient Class:  Inpatient    ADMITTING / DISCHARGING PHYSICIAN   Admitting Physician:  Raleigh Nation, MD    Discharge Physician:  Duayne Cal, M.D.    HOSPITAL COURSE     Discharge Diagnoses:     -  Gen weakness, altered mental status, d/t progression of Huntington dz, exacerbated by hyponatremia  -  Hyponatremia d/t meds, polydipsia, impulsive behavior from HD; resolved  -  Oropharyngeal dysphagia d/t HD    Brief Hospital Course:   Brenda [name] is a 60 y.o. female who was admitted to the hospital on 03/08/2016 for generalized weakness, subsequently found to have progression of known Huntington's disease. Seen in consultation by PT/OT who recommended SNF placement. Patient was clinically improved and medically stable for discharge on 03/16/2016.      PHYSICAL EXAM   Physical exam on day of hospital discharge:     BP 117/64 (BP Location: Left arm, Patient Position: Lying)   Pulse 102   Temp 98.3 F (36.8 C) (Oral)   Resp 16   Ht 5' 5 (1.651 m)   Wt 149 lb (67.6 kg)   LMP 05/20/2011   SpO2 95%   BMI 24.79 kg/m     General Appearance:  Alert, cooperative, no distress, appears stated age    Head:  Normocephalic, without obvious abnormality, atraumatic    Eyes:  EOMI, sclera nonicteric   Throat:  Moist mucus membranes    Neck:  Supple, trachea midline   Lungs:  Clear to auscultation bilaterally, respirations unlabored    Heart:  Regular rate and rhythm, S1 and S2 normal   Abdomen:  Soft, non-tender, bowel sounds active all four quadrants   Extremities:  Extremities normal, no edema   Pulses:  2+ and symmetric    Skin:  Warm and dry   Neurologic:  Grossly nonfocal, moves all extremities         LABS   Labs prior to hospital discharge:  No results for input(s): WBC, HGB, HCT, PLT in the last 72  hours.                                                               No results for input(s): NA, K, CL, CO2, BUN, CREATININE, GLUCOSE in the last 72 hours.    SURGERIES         IMAGING STUDIES   No results found.       ALLERGIES   Zithromax [Azithromycin]  Bactrim [Sulfamethoxazole-Trimethoprim]  Erythromycin  Erythromycin Base  Methylphenidate  Sulfa (Sulfonamide Antibiotics)      DISCHARGE MEDICATIONS     Discharge Medication List as of 03/16/2016  3:57 PM      CONTINUE these medications which have CHANGED    Details   !! clonazePAM (KLONOPIN) 0.5 MG tablet Take 1 tablet (0.5 mg total) by mouth 2 times a day as needed (agitation , must be at least one hour since scheduled or prn clonazepam dose given)., Starting Tue 03/16/2016, Print      !!  clonazePAM (KLONOPIN) 1 MG tablet Take 3 tablets (3 mg total) by mouth at bedtime., Starting Tue 03/16/2016, Print      traZODone (DESYREL) 100 MG tablet Take 1 tablet (100 mg total) by mouth at bedtime., Starting Tue 03/16/2016, Print       !! - Potential duplicate medications found. Please discuss with provider.      CONTINUE these medications which have NOT CHANGED    Details   cholecalciferol, vitamin D3, 1000 units tablet Take 1,000 Units by mouth daily., Until Discontinued, Historical Med      lithium (ESKALITH) 450 MG CR tablet Take 450 mg by mouth At bedtime., Until Discontinued, Historical Med      mirabegron (MYBETRIQ) 50 mg Tb24 Take 50 mg by mouth daily. Indications: Bladder Hyperactivity, Until Discontinued, Historical Med      multivitamin (THERAGRAN) tablet Take 1 tablet by mouth daily.  , Historical Med      omeprazole 20 mg TbEC Take 40 mg by mouth daily., Until Discontinued, Historical Med      risperiDONE (RISPERDAL) 1 MG tablet Take 3 mg by mouth at bedtime., Until Discontinued, Historical Med         STOP taking these medications       omeprazole (PRILOSEC) 40 MG capsule Comments:   Reason for Stopping:                 DISPOSITION   SNF      DISCHARGE  INSTRUCTIONS   Activity: activity as tolerated  Diet: regular diet    FOLLOW-UP   Call and schedule an appointment with your Primary Care Physician within 1 week of discharge.    Total time spent on this discharge was 31 minutes including time spent with the patient, reviewing data, completing medical records, and communication with the patient.    Thank you for the opportunity to be involved in your patient's care. If you have any questions or concerns regarding this hospitalization, please feel free to call me at [phone].    Electronically signed,  Duayne Cal, M.D.  Pager: [phone]  03/16/2016  1:07 PM  [facility]

## 2016-03-16 NOTE — Progress Notes (Addendum)
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 White or Caucasian female ho presented to [facility] with Huntington chorea G10.  Pt. with mental status changes and hyponatremia.    Assessment  PT 6 Clicks  Help From Another Person Turning From Back to Side While Flat in Bed Without Using Siderails: A little  Help From Another Person Moving From Lying On Back To Sitting Without Using Siderails: A little  Help From Another Person Moving To And From Bed To Chair: A little  Help From Another Person Standing Up From Chair Using Your Arms: A little  Help From Another Person To Walk In Hospital Room: A little  Help From Another Person Climbing 3-5 Steps With A Railing: A little  PT 6 Clicks Score: 18  Assessment: Impaired Bed Mobility, Impaired Transfer Mobility, Impaired Ambulation, Impaired Balance, Impaired Strength, Impaired ROM, Impaired Safety Awareness, Deconditioning  Prognosis: Good     Vitals taken in supine after gait: 153/89, HR: 109 bpm, o2 saturation: 98% on room air    Goals  Pt Will Go Supine To Sit: Supervision  Sit To Stand: Supervision with RW  Pt Will Transfer Bed/Chair: Supervision (with RW)  Pt Will Ambulate: Supervision (with RW with 250')  Miscellaneous Goal #1: Patient to participate in LE HEP and balance activities to improve overall functional mobility.   Time frame for goals to be met in: 7 days (03/17/16)    Recommendation  Plan  Treatment/Interventions: LE strengthening/ROM, Neuromuscular Reeducation, Endurance training, Patient/family training, Therapeutic Activity, Equipment eval/education, Therapeutic Exercise, Gait  training, Compensatory technique education, Continued evaluation, Stair Training  PT Frequency: 3-5x/wk    Recommendation  Recommendation: Short-term skilled PT    Problem List  Patient Active Problem List   Diagnosis    Huntington's chorea    Asthma    Esophageal reflux    Osteopenia    Fatigue    Chronic sinusitis    Anxiety    Depression    Paranoia    Care involving speech-language therapy    Hoarseness of voice    Laryngopharyngeal reflux (LPR)    Cough    Allergic rhinitis    GERD (gastroesophageal reflux disease)    Anxiety and depression    Huntington's disease    Dysphagia    Needs a medical hold    Hyponatremia        Past Medical History  Past Medical History:   Diagnosis Date    Anxiety     Asthma     Chronic sinusitis 05/24/2011    Environmental allergies     Esophageal reflux 08/10/2010    Fatigue 05/24/2011    Fracture of left orbit 2010    Hematoma, left eyebrow 04/18/2009    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    Osteopenia 08/10/2010    Superficial injury of face without infection 04/12/2009        Past Surgical History  Past Surgical History:  Procedure Laterality Date    SINUS SURGERY      TONSILLECTOMY         Cognition:  Overall Cognitive Status: Impaired  Arousal/Alertness: Delayed responses to stimuli  Orientation Level: Oriented X4  Following Commands: Follows one step commands  Safety Judgment: Decreased awareness of need for safety     Pain:  Pain Score: 0-No pain     Mobility:  Bed Mobility  Rolling: Supervision  Supine to Sit: Supervision  Sit to Supine: Supervision  *x2 total repetitions  Transfers  Sit to Stand: Contact Guard (x4 reps)  Gait  Pattern: Shuffle;R Decreased stance time;L Decreased stance time, writhing movement (very impulsive, quick pace, running into obstacles in hallway, poor insight into safety)  Gait Assistance: Minimal  Assistive Device: Rolling walker  Distance: 125' x 2' (one standing rest break)  Balance  Sitting - Static: Good  Sitting -  Dynamic: Good  Standing - Static: Fair (with a RW)  Standing - Dynamic: Fair (with a RW)    *pt VERY IMPULSIVE during mobility, pt requires MAXIMAL verbal cuing for safety throughout mobility    Exercise:  Supine  Supine-Exercises: Lower extremity;Lower extremity strength group;Specific exercises  Supine-Exercise Type: Ankle pumps;Straight leg raising;ABduction/ADduction  Supine-Exercise Comments: x 10 reps BLE's. MAXIMAL tactile cuing for technique  Seated  Seated-Exercises: Lower extremity;Specific exercises  Seated-Exercise Type: Long arc quads;Hip flexion  Seated-Exercise Comments: x 10 reps BLE's. MAXIMAL tactile cuing for technique  Standing  Standing-Exercises: Lower extremity;Specific exercises  Standing-Exercise Type: ADduction;ABduction;Squats;Toe raises;Heel raises  Standing-Exercise Comments: x 10 reps BLE's    *VERY IMPULSIVE THROUGHOUT EXERCISES, CUING TO SLOW DOWN AND TAKE HER TIME.    Patient Education:   1. Education on benefits of OOB activity      Handoff of Care:  Safety Handoff completed with RN after Rehabilitation session.      Position at End of Therapy Session:  Patient in bed with call light/needs in reach and bed alarm activated. Educated patient to utilize call button to ring for assist as needed and to ALWAYS ring for assist for functional transfers.  Educated patient NOT to attempt transfers without staff assist.   Bed Alarm set/activated.      Time  Start Time: 1327  Stop Time: 1356  Time Calculation (min): 29 min    Charges  $Gait/Mobility: 8-22 mins  $Therapeutic Exercise: 8-22 mins                  Joline Salt PT,DPT   Physical Therapist, License #: [id number]  ASCOM # [phone]  03/16/2016 2:07 PM

## 2016-03-17 NOTE — Telephone Encounter (Signed)
I think it is fine to try and spread the clonazepam throughout the day however if they feel symptoms worsen with this change (for example, she has a harder time sleeping) then I recommend they change back to the old way.

## 2016-03-17 NOTE — Telephone Encounter (Signed)
FYI... Nurse practioner at [facility] facility Melodie wants to change her medication times due to her drowsiness.  Going to spread the Clonazepam doses throughout the day.

## 2016-03-17 NOTE — Telephone Encounter (Signed)
I will place a referral to Dr. Vivi Ferns at [facility] psychiatry.  Kathlene November just needs to call [phone] to make an appointment.

## 2016-03-17 NOTE — Telephone Encounter (Signed)
Returned call to husband Kathlene November.  No answer.  LM for him to return my call to discuss.

## 2016-03-17 NOTE — Telephone Encounter (Signed)
Pt's husband Kathlene November is calling to speak with Dr Gerilyn Pilgrim about pt getting in to see a psychiatrist Dr Gerre Scull and what the nursing home is recommending. Pt is constantly c/o fatigue and there was talk about spreading the pt's meds out. Pls advise.

## 2016-03-29 NOTE — Telephone Encounter (Signed)
Pt's husband is calling to speak with someone regarding getting info about the pt's dx. Pt's husband has an appt with [facility] tomorrow and needs some info ASAP. Pls advise.

## 2016-03-29 NOTE — Telephone Encounter (Signed)
Returned call to [employer].  He is wanting the Gene count for her Huntington's disease when she was diagnosed.  Information was given.

## 2016-04-01 ENCOUNTER — Other Ambulatory Visit: Payer: Self-pay | Admitting: Family Medicine

## 2016-04-01 DIAGNOSIS — L509 Urticaria, unspecified: Secondary | ICD-10-CM

## 2016-04-02 NOTE — Telephone Encounter (Addendum)
Spoke with nurse at [facility].  Given instructions to increase risperidone to 1 mg in the morning and 3 mg at bedtime and to increase clonazepam to 0.5 mg in the morning, 0.5 mg in the afternoon and 2 mg at bedtime. It doesn't sound like patient can go home at this point but nurse at facility says she cannot stay in rehab anymore due to insurance. The options at this point are the patient can go to long term care at [facility] rather than being discharged home and I can just adjust medications while she is in long term care (the facility is checking to see if this is an option). If this is not an option or If agitation becomes severe then she may need to go to ED for medical evaluation then admit to psychiatry if medically cleared. The nurse at the nursing home was going to discuss the above with the patient and her husband and they are supposed to get back with me.

## 2016-04-02 NOTE — Telephone Encounter (Signed)
Pt's husband is calling to report the pt has been having psychotic episodes, speaking out loud, cussing, etc. States he will need to get the pt into a psych facility, pt is due to be released fr [facility] today. Is asking for advice fr Dr Gerilyn Pilgrim. Pt has been displaying this behavior for 2 days. Pls advise.

## 2016-04-07 ENCOUNTER — Other Ambulatory Visit: Payer: Self-pay | Admitting: Family Medicine

## 2016-04-07 ENCOUNTER — Other Ambulatory Visit: Payer: Self-pay

## 2016-04-07 DIAGNOSIS — R928 Other abnormal and inconclusive findings on diagnostic imaging of breast: Secondary | ICD-10-CM

## 2016-04-07 NOTE — Progress Notes (Unsigned)
.  dis

## 2016-04-08 LAB — HM MAMMOGRAPHY

## 2016-04-20 ENCOUNTER — Encounter: Payer: PRIVATE HEALTH INSURANCE | Attending: Psychiatry

## 2016-04-21 ENCOUNTER — Inpatient Hospital Stay
Admit: 2016-04-21 | Discharge: 2016-04-22 | Disposition: A | Discharge status: Other Acute Inpatient Hospital | Payer: PRIVATE HEALTH INSURANCE

## 2016-04-21 ENCOUNTER — Emergency Department: Payer: PRIVATE HEALTH INSURANCE

## 2016-04-21 DIAGNOSIS — F22 Delusional disorders: Secondary | ICD-10-CM

## 2016-04-21 LAB — BASIC METABOLIC PANEL
Anion Gap: 10 mmol/L (ref 3–16)
BUN: 15 mg/dL (ref 7–25)
CO2: 27 mmol/L (ref 21–33)
Calcium: 9.6 mg/dL (ref 8.6–10.3)
Chloride: 105 mmol/L (ref 98–110)
Creatinine: 0.75 mg/dL (ref 0.60–1.30)
Glucose: 106 mg/dL (ref 70–100)
Osmolality, Calculated: 295 mOsm/kg (ref 278–305)
Potassium: 3.8 mmol/L (ref 3.5–5.3)
Sodium: 142 mmol/L (ref 133–146)
eGFR AA CKD-EPI: >90 See note.
eGFR NONAA CKD-EPI: 87 See note.

## 2016-04-21 LAB — CBC
Hematocrit: 40.2 % (ref 35.0–45.0)
Hemoglobin: 13.6 g/dL (ref 11.7–15.5)
MCH: 30 pg (ref 27.0–33.0)
MCHC: 34 g/dL (ref 32.0–36.0)
MCV: 88.3 fL (ref 80.0–100.0)
MPV: 9.3 fL (ref 7.5–11.5)
Platelets: 233 10*3/uL (ref 140–400)
RBC: 4.55 10*6/uL (ref 3.80–5.10)
RDW: 13.4 % (ref 11.0–15.0)
WBC: 8.3 10*3/uL (ref 3.8–10.8)

## 2016-04-21 LAB — DIFFERENTIAL
Basophils Absolute: 33 /uL (ref 0–200)
Basophils Relative: 0.4 % (ref 0.0–1.0)
Eosinophils Absolute: 58 /uL (ref 15–500)
Eosinophils Relative: 0.7 % (ref 0.0–8.0)
Lymphocytes Absolute: 1220 /uL (ref 850–3900)
Lymphocytes Relative: 14.7 % (ref 15.0–45.0)
Monocytes Absolute: 830 /uL (ref 200–950)
Monocytes Relative: 10 % (ref 0.0–12.0)
Neutrophils Absolute: 6159 /uL (ref 1500–7800)
Neutrophils Relative: 74.2 % (ref 40.0–80.0)
nRBC: 0 /100 WBC (ref 0–0)

## 2016-04-21 LAB — TROPONIN I: Troponin I: <0.04 ng/mL (ref 0.00–0.03)

## 2016-04-21 LAB — B NATRIURETIC PEPTIDE: BNP: 18 pg/mL (ref 0–100)

## 2016-04-21 MED ORDER — dextrose 5 % and 0.9 % NaCl infusion 1000 mL
Freq: Once | INTRAVENOUS | Status: AC
Start: 2016-04-21 — End: 2016-04-21
  Administered 2016-04-22: 02:00:00 1000 mL/h via INTRAVENOUS

## 2016-04-21 MED ORDER — pantoprazole (PROTONIX) EC tablet 40 mg
40 | Freq: Once | ORAL | Status: AC
Start: 2016-04-21 — End: 2016-04-21
  Administered 2016-04-22: 40 mg via ORAL

## 2016-04-21 MED FILL — PANTOPRAZOLE 40 MG TABLET,DELAYED RELEASE: 40 40 MG | ORAL | Qty: 1

## 2016-04-21 NOTE — Other (Unsigned)
[facility]                                                        [facility] ED Note    Date of Service: 04/21/2016    Reason for Visit: Chest Pain      Patient History    HPI:  This is a 60 y.o. female with history of Huntington's disease, anxiety,  asthma, reflux presenting with chest pain and agitation.  Patient currently  resides at a facility and reportedly was refusing to take her medications this  morning which include Risperdal, Klonopin and Prilosec.  Throughout the day  patient was becoming more agitated where this evening she began yelling that  "I'm having a heart attack, this is my reflux".  EMS was called given her  complaints of chest pain.  When EMS arrived she was initially refusing to be  transported to have an EKG.  She eventually took her home Risperdal and   Klonopin  with some improvement in her symptoms.  Pre-hospital EKG was performed that   did  not show any acute cardiac abnormalities.  Upon arrival to the emergency  department, the patient continually states that she has reflux and they have   not  been giving her her Prilosec at the nursing facility.  Husband arrived at the  bedside later and reported that patient normally perseverates and this is her  baseline.    Past Medical History:  Diagnosis Date   Anxiety   Asthma   Chronic sinusitis 05/24/2011   Environmental allergies   Esophageal reflux 08/10/2010   Fatigue 05/24/2011   Fracture of left orbit 2010   Hematoma, left eyebrow 04/18/2009   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   Osteopenia 08/10/2010   Superficial injury of face without infection 04/12/2009      Past Surgical History:  Procedure Laterality Date   SINUS SURGERY   TONSILLECTOMY      Danielle CambridgeLee C Crisafulli  reports that she has never smoked. She has never used smokeless  tobacco. She reports that she drinks about 0.6 oz of alcohol per week . She  reports that she does not use drugs.    Patient's Medications  New Prescriptions   No medications on file  Previous Medications    CHOLECALCIFEROL, VITAMIN D3, 1000 UNITS TABLET    Take 1,000 Units by mouth  daily.   CLONAZEPAM (KLONOPIN) 0.5 MG TABLET    Take 1 tablet (0.5 mg total) by mouth   2  times a day as needed (agitation , must be at least one hour since scheduled   or  prn clonazepam dose given).   CLONAZEPAM (KLONOPIN) 1 MG TABLET    Take 3 tablets (3 mg total) by mouth at  bedtime.   LITHIUM (ESKALITH) 450 MG CR TABLET    Take 450 mg by mouth At bedtime.   MIRABEGRON (MYBETRIQ) 50 MG TB24    Take 50 mg by mouth daily. Indications:  Bladder Hyperactivity   MULTIVITAMIN (THERAGRAN) TABLET    Take 1 tablet by mouth daily.   OMEPRAZOLE 20 MG TBEC    Take 40 mg by mouth daily.   RISPERIDONE (RISPERDAL) 1 MG TABLET    Take 3 mg by mouth at bedtime.   TRAZODONE (DESYREL) 100 MG TABLET    Take 1  tablet (100 mg total) by mouth at  bedtime.  Modified Medications   No medications on file  Discontinued Medications   No medications on file      Allergies:  Allergies as of 04/21/2016 - Fully Reviewed 03/15/2016  Allergen Reaction Noted   Zithromax [azithromycin] Anaphylaxis 06/08/2011   Bactrim [sulfamethoxazole-trimethoprim] Nausea Only and Hives 06/08/2011   Erythromycin Diarrhea 07/30/2011   Erythromycin base Diarrhea and Nausea And Vomiting   Methylphenidate Hives 05/08/2013   Sulfa (sulfonamide antibiotics) Hives 03/26/2011      PMH: Nursing notes reviewed  PSH: Nursing notes reviewed  FH: Nursing notes reviewed  MEDS: Nursing notes and chart reviewed    Review of Systems    Review of Systems  Constitutional: Negative for chills, fever and weight loss.  HENT: Negative for sore throat.  Eyes: Negative for blurred vision and double vision.  Respiratory: Positive for cough.  Gastrointestinal: Positive for heartburn. Negative for abdominal pain, nausea  and vomiting.  Genitourinary: Negative for dysuria, frequency and urgency.  Musculoskeletal: Negative for myalgias and neck pain.  Skin: Negative for rash.  Neurological: Negative for dizziness,  seizures, loss of consciousness and  headaches.  All other systems reviewed and are negative.    Physical Exam    ED Triage Vitals  Vital Signs Group     Temp     Temp src     Pulse     Heart Rate Source     Resp     SpO2     BP     BP Location     BP Method     Patient Position  SpO2  O2 Device      Physical Exam  Constitutional: She is well-developed, well-nourished, and in no distress. No  distress.  HENT:  Head: Normocephalic and atraumatic.  Mouth/Throat: Oropharynx is clear and moist.  Eyes: EOM are normal. Pupils are equal, round, and reactive to light.  Neck: Normal range of motion. Neck supple.  Cardiovascular: Normal rate, regular rhythm and intact distal pulses.  No murmur heard.  Pulmonary/Chest: Effort normal and breath sounds normal. No respiratory  distress. She has no wheezes. She has no rales.  Abdominal: Soft. She exhibits no distension. There is no tenderness. There is   no  rebound and no guarding.  Musculoskeletal: Normal range of motion. She exhibits no edema or tenderness.  Neurological: She is alert.  Patient moving all extremities equally.  No focal neuro deficit on exam.  Sensation appears to be grossly intact.  Skin: Skin is warm and dry. No rash noted. She is not diaphoretic.  Psychiatric:  Patient is tangential and not answering questions appropriately.  She  consistently reports that she is being poisoned at her nursing home and  interrupting without appropriate answers to questions.  Nursing note and vitals reviewed.      Diagnostic Studies    Labs:  Please see Epic for full details    Radiology:  No orders to display      EKG interpretation:    Rate: 99  Rhythm: NSR  Axis: Normal  P waves: Normal  PR Interval: Normal  QRS: Q wave in lead III  ST segments: No elevations or depressions  T waves: flattened in lead III  QT interval: Normal  Comparison: Unavailable      Emergency Department Procedures      ED Course and MDM    Danielle Lawrence is a 60 y.o. female with a history of Huntington's  disease,   anxiety,  asthma, reflux presenting with chest pain and agitation. Please see HPI for   full  details. Patient hemodynamically stable on initial presentation.  Patient is  perseverating and somewhat hyperactive, but not causing harm to herself at   this  time.  Prior to her husband arriving at the bedside, it was unclear whether   she  was experiencing pain from reflux versus cardiac symptoms given she is a poor  historian. EKG did not show any ischemic changes.  CBC was unremarkable for  leukocytosis or anemia.  BMP within normal limits.  Troponin and BNP were  negative.  By the time the husband arrived, he reported that she complains of  these symptoms with reflux and was not concerned for cardiac etiology to her  symptoms.  Patient did refuse a chest x-ray, however given she had clear lungs  to auscultation I did not feel a need to pursue this study as I do not believe  there is a pulmonary etiology to her symptoms.  Patient was given Prilosec in  the emergency department with improvement in her symptoms.  Low suspicion for  ACS, pneumonia, PE, dissection, aneurysm as the cause of her chest pain. UA   was  also ordered that did not show any evidence of infection.  There were 40   ketones  present and patient was rehydrated with IV fluids.  Psych Child psychotherapist  evaluated patient and signed a hold given her new onset paranoia and   delusions.  Plan was discussed with husband.  Discussed the patient with [facility] M[facility]D. who  accepted the patient for further evaluation and management.    The following meds were administered:  Medications  pantoprazole (PROTONIX) EC tablet 40 mg (40 mg Oral Given 04/21/16 2002)  dextrose 5 % and 0.9 % NaCl infusion 1000 mL (1,000 mL/hr Intravenous New Bag  04/21/16 2219)    At this time the patient has been transferred to [facility]    The patient was evaluated by myself and the ED Attending Physician, Dr. Carolynne Edouard.  All management and disposition plans were discussed and agreed  upon.      Impression    1. Paranoia  2. Delusions  3. Huntington's disease        San Morelle, MD, PGY-2  [facility]            San Morelle, MD  Resident  04/21/16 [phone]

## 2016-04-21 NOTE — ED Provider Notes (Signed)
ED Attending Attestation Note    Date of service:  04/21/2016    This patient was seen by the resident physician.  I have seen and examined the patient, agree with the workup, evaluation, management and diagnosis. The care plan has been discussed and I concur.  I have reviewed the ECG and concur with the resident's interpretation.    My assessment reveals a 60 y.o. female who presents to the emergency department for worsening agitation.  Patient has been in the nursing home for the last 3 weeks because of progression of her Huntington's disease.  Today she refused her medications until her husband got there just before transport.  She was also complaining of some chest pain.  She has no exertional symptoms.  On exam is a Caucasian female with clear bilateral breath sounds

## 2016-04-21 NOTE — ED Notes (Signed)
Patient sitting in bed with rails up and light in reach. Asked patient about urinating on the bedpan patient refused.

## 2016-04-21 NOTE — ED Notes (Signed)
SW contacted [facility] for patient transfer to [facility]. [facility] will arrive at 12 am (30 minute wait).     Lenoard Aden, MSW, LSW

## 2016-04-21 NOTE — ED Notes (Signed)
Psych Social Work Brief Note      Presentation: Pt is a 60 y/o caucasian female who was sent to the [facility] today from [facility] nursing facility after pt has been exhibiting significant mental status changes the past day or two. Pt has hx of Huntington's disease.  For past few days pt has been delusional, believing that nurses are giving her substances that are making her sick. Pt refused to take psych meds this morning (later relented). Pt reportedly (per husband) has been very agitated -- jumping up and down and yelling at nurses -- almost to the point of being combative.  Pt has been walking away from her walker -- has balance problems. Pt has been talking non-stop starting today.  Pt was also complaining of chest pain/reflux and was checked out in [facility] for heart problems.    Writer met with pt at bedside. Pt presented with tangential/flight of ideas speech. + perseveration.  Pt said they come in the middle of the night and make me sick.  Pt is afraid she is having a heart attack.      Current Mental Status: +paranoia/delusions, tangential/flight of ideas thought processes,   + perseveration      Substance Use: Not currently known.      History of Mental Health Treatment: Pt was diagnosed with Huntington's disease in 2012.  This has affected pt mentally as well ,and pt currently takes psychiatric medication. Pt was admitted onto [facility] inpt psych unit four years ago for six days due to a similar episode. Pt responded well to medication changes per husband.  Husband said pt was seeing a psychiatrist but that psychiatrist will not call him back. Pt is scheduled to see a new psychiatrist 05/04/16.      Collateral: Writer spoke to pt's husband, Brenda [name]  [phone], at the [facility] for collateral info. Husband said paranoia started 1-2 days ago -- patient said nurses are out to get her this morning. Increased agitation to point of  combativeness. Jumping up and down and yelling at nurses. Pt refused meds this morning. Pt has been walking away from her walker. Significant mental status changes. Husband thinks pt is now a danger to her self.      Telepsychiatry Considerations: Not currently indicated.      Formulation of Plan: Clinical research associate wrote Statement of Belief psychiatric hold for pt who will be transferred to [facility] for further psych eval and disposition once medically cleared.      Patient Reaction to Plan: Pt does not want to go to [facility].      Transportation Plan: Pt will be transported via [facility].

## 2016-04-21 NOTE — ED Triage Notes (Signed)
Patient reports to the ER from a nursing facility for chest pain. The nursing facility states that she refused her medications today. The nursing facility gave her clonazepam for her anxiety before transport. Has huntington's.

## 2016-04-21 NOTE — ED Triage Notes (Signed)
Patient continually c/o heart burn chest pain. Very repeatative. No family at bedside to verify questions

## 2016-04-21 NOTE — Unmapped (Signed)
[place] ED Note    Date of Service: 04/21/2016    Reason for Visit: Chest Pain      Patient History     HPI:  This is a 60 y.o. female with history of Huntington's disease, anxiety, asthma, reflux presenting with chest pain and agitation.  Patient currently resides at a facility and reportedly was refusing to take her medications this morning which include Risperdal, Klonopin and Prilosec.  Throughout the day patient was becoming more agitated where this evening she began yelling that I'm having a heart attack, this is my reflux.  EMS was called given her complaints of chest pain.  When EMS arrived she was initially refusing to be transported to have an EKG.  She eventually took her home Risperdal and Klonopin with some improvement in her symptoms.  Pre-hospital EKG was performed that did not show any acute cardiac abnormalities.  Upon arrival to the emergency department, the patient continually states that she has reflux and they have not been giving her her Prilosec at the nursing facility.  Husband arrived at the bedside later and reported that patient normally perseverates and this is her baseline.     Past Medical History:   Diagnosis Date   ??? Anxiety    ??? Asthma    ??? Chronic sinusitis 05/24/2011   ??? Environmental allergies    ??? Esophageal reflux 08/10/2010   ??? Fatigue 05/24/2011   ??? Fracture of left orbit 2010   ??? Hematoma, left eyebrow 04/18/2009   ??? 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   ??? Osteopenia 08/10/2010   ??? Superficial injury of face without infection 04/12/2009       Past Surgical History:   Procedure Laterality Date   ??? SINUS SURGERY     ??? TONSILLECTOMY         Brenda [name]  reports that she has never smoked. She has never used smokeless tobacco. She reports that she drinks about 0.6 oz of alcohol per week . She reports that she does not use drugs.    Patient's Medications   New Prescriptions    No medications on file   Previous Medications     CHOLECALCIFEROL, VITAMIN D3, 1000 UNITS TABLET    Take 1,000 Units by mouth daily.    CLONAZEPAM (KLONOPIN) 0.5 MG TABLET    Take 1 tablet (0.5 mg total) by mouth 2 times a day as needed (agitation , must be at least one hour since scheduled or prn clonazepam dose given).    CLONAZEPAM (KLONOPIN) 1 MG TABLET    Take 3 tablets (3 mg total) by mouth at bedtime.    LITHIUM (ESKALITH) 450 MG CR TABLET    Take 450 mg by mouth At bedtime.    MIRABEGRON (MYBETRIQ) 50 MG TB24    Take 50 mg by mouth daily. Indications: Bladder Hyperactivity    MULTIVITAMIN (THERAGRAN) TABLET    Take 1 tablet by mouth daily.      OMEPRAZOLE 20 MG TBEC    Take 40 mg by mouth daily.    RISPERIDONE (RISPERDAL) 1 MG TABLET    Take 3 mg by mouth at bedtime.    TRAZODONE (DESYREL) 100 MG TABLET    Take 1 tablet (100 mg total) by mouth at bedtime.   Modified Medications    No medications on file   Discontinued Medications    No medications on file       Allergies:   Allergies as of 04/21/2016 - Fully Reviewed 03/15/2016  Allergen Reaction Noted   ??? Zithromax [azithromycin] Anaphylaxis 06/08/2011   ??? Bactrim [sulfamethoxazole-trimethoprim] Nausea Only and Hives 06/08/2011   ??? Erythromycin Diarrhea 07/30/2011   ??? Erythromycin base Diarrhea and Nausea And Vomiting    ??? Methylphenidate Hives 05/08/2013   ??? Sulfa (sulfonamide antibiotics) Hives 03/26/2011       PMH: Nursing notes reviewed   PSH: Nursing notes reviewed   FH: Nursing notes reviewed   MEDS: Nursing notes and chart reviewed     Review of Systems     Review of Systems   Constitutional: Negative for chills, fever and weight loss.   HENT: Negative for sore throat.    Eyes: Negative for blurred vision and double vision.   Respiratory: Positive for cough.    Gastrointestinal: Positive for heartburn. Negative for abdominal pain, nausea and vomiting.   Genitourinary: Negative for dysuria, frequency and urgency.   Musculoskeletal: Negative for myalgias and neck pain.   Skin: Negative for rash.    Neurological: Negative for dizziness, seizures, loss of consciousness and headaches.   All other systems reviewed and are negative.    Physical Exam     ED Triage Vitals   Vital Signs Group      Temp       Temp src       Pulse       Heart Rate Source       Resp       SpO2       BP       BP Location       BP Method       Patient Position    SpO2    O2 Device        Physical Exam   Constitutional: She is well-developed, well-nourished, and in no distress. No distress.   HENT:   Head: Normocephalic and atraumatic.   Mouth/Throat: Oropharynx is clear and moist.   Eyes: EOM are normal. Pupils are equal, round, and reactive to light.   Neck: Normal range of motion. Neck supple.   Cardiovascular: Normal rate, regular rhythm and intact distal pulses.    No murmur heard.  Pulmonary/Chest: Effort normal and breath sounds normal. No respiratory distress. She has no wheezes. She has no rales.   Abdominal: Soft. She exhibits no distension. There is no tenderness. There is no rebound and no guarding.   Musculoskeletal: Normal range of motion. She exhibits no edema or tenderness.   Neurological: She is alert.   Patient moving all extremities equally.  No focal neuro deficit on exam.  Sensation appears to be grossly intact.   Skin: Skin is warm and dry. No rash noted. She is not diaphoretic.   Psychiatric:   Patient is tangential and not answering questions appropriately.  She consistently reports that she is being poisoned at her nursing home and interrupting without appropriate answers to questions.   Nursing note and vitals reviewed.      Diagnostic Studies     Labs:  Please see Epic for full details    Radiology:  No orders to display       EKG interpretation:    Rate: 99   Rhythm: NSR  Axis: Normal  P waves: Normal  PR Interval: Normal  QRS: Q wave in lead III  ST segments: No elevations or depressions   T waves: flattened in lead III  QT interval: Normal  Comparison: Unavailable      Emergency Department Procedures  ED  Course and MDM     Brenda [name] is a 60 y.o. female with a history of Huntington's disease, anxiety, asthma, reflux presenting with chest pain and agitation. Please see HPI for full details. Patient hemodynamically stable on initial presentation.  Patient is perseverating and somewhat hyperactive, but not causing harm to herself at this time.  Prior to her husband arriving at the bedside, it was unclear whether she was experiencing pain from reflux versus cardiac symptoms given she is a poor historian. EKG did not show any ischemic changes.  CBC was unremarkable for leukocytosis or anemia.  BMP within normal limits.  Troponin and BNP were negative.  By the time the husband arrived, he reported that she complains of these symptoms with reflux and was not concerned for cardiac etiology to her symptoms.  Patient did refuse a chest x-ray, however given she had clear lungs to auscultation I did not feel a need to pursue this study as I do not believe there is a pulmonary etiology to her symptoms.  Patient was given Prilosec in the emergency department with improvement in her symptoms.  Low suspicion for ACS, pneumonia, PE, dissection, aneurysm as the cause of her chest pain. UA was also ordered that did not show any evidence of infection.  There were 40 ketones present and patient was rehydrated with IV fluids.  Psych Child psychotherapist evaluated patient and signed a hold given her new onset paranoia and delusions.  Plan was discussed with husband.  Discussed the patient with [facility] M[facility]D. who accepted the patient for further evaluation and management.     The following meds were administered:  Medications   pantoprazole (PROTONIX) EC tablet 40 mg (40 mg Oral Given 04/21/16 2002)   dextrose 5 % and 0.9 % NaCl infusion 1000 mL (1,000 mL/hr Intravenous New Bag 04/21/16 2219)     At this time the patient has been transferred to [facility].    The patient was evaluated by myself and the ED Attending Physician, Dr. Carolynne Edouard. All management and  disposition plans were discussed and agreed upon.      Impression     1. Paranoia    2. Delusions    3. Huntington's disease           San Morelle, MD, PGY-2  [facility]             San Morelle, MD  Resident  04/21/16 [phone]

## 2016-04-22 ENCOUNTER — Observation Stay
Admit: 2016-04-22 | Discharge: 2016-04-22 | Disposition: A | Discharge status: Skilled Nursing Facility | Payer: PRIVATE HEALTH INSURANCE | Admitting: Psychiatry

## 2016-04-22 DIAGNOSIS — F0151 Vascular dementia with behavioral disturbance: Principal | ICD-10-CM

## 2016-04-22 LAB — URINALYSIS-MACROSCOPIC W/REFLEX TO MICROSCOPIC
Blood, UA: NEGATIVE
Glucose, UA: NEGATIVE mg/dL
Ketones, UA: 40 mg/dL
Leukocytes, UA: NEGATIVE
Nitrite, UA: NEGATIVE
Protein, UA: 30 mg/dL
Specific Gravity, UA: >=1.03 (ref 1.005–1.035)
Urobilinogen, UA: 0.2 EU/dL (ref 0.2–1.0)
pH, UA: 6 (ref 5.0–8.0)

## 2016-04-22 LAB — URINALYSIS, MICROSCOPIC
RBC, UA: 2 /HPF (ref 0–3)
Squam Epithel, UA: <1 /HPF (ref 0–5)
WBC, UA: 1 /HPF (ref 0–5)

## 2016-04-22 MED ORDER — omeprazole (PRILOSEC) capsule 20 mg
20 | Freq: Every day | ORAL | Status: AC
Start: 2016-04-22 — End: 2016-04-22

## 2016-04-22 MED ORDER — traZODone (DESYREL) tablet 100 mg
100 | Freq: Every evening | ORAL | Status: AC
Start: 2016-04-22 — End: 2016-04-22
  Administered 2016-04-22: 07:00:00 100 mg via ORAL

## 2016-04-22 MED ORDER — pantoprazole (PROTONIX) EC tablet 40 mg
40 | Freq: Every day | ORAL | Status: AC
Start: 2016-04-22 — End: 2016-04-22
  Administered 2016-04-22: 13:00:00 40 mg via ORAL

## 2016-04-22 MED ORDER — multivitamin with folic acid tablet 1 tablet
400 | Freq: Every day | ORAL | Status: AC
Start: 2016-04-22 — End: 2016-04-22
  Administered 2016-04-22: 13:00:00 1 via ORAL

## 2016-04-22 MED ORDER — clonazePAM (klonoPIN) tablet 0.5 mg
0.5 | Freq: Two times a day (BID) | ORAL | Status: AC | PRN
Start: 2016-04-22 — End: 2016-04-22
  Administered 2016-04-22: 13:00:00 0.5 mg via ORAL

## 2016-04-22 MED ORDER — risperiDONE (RISPERDAL) tablet 3 mg
3 | Freq: Every evening | ORAL | Status: AC
Start: 2016-04-22 — End: 2016-04-22
  Administered 2016-04-22: 07:00:00 3 mg via ORAL

## 2016-04-22 MED ORDER — lithium (ESKALITH) ER tablet 450 mg
450 | Freq: Every evening | ORAL | Status: AC
Start: 2016-04-22 — End: 2016-04-22
  Administered 2016-04-22: 07:00:00 450 mg via ORAL

## 2016-04-22 MED ORDER — cholecalciferol (vitamin D3) tablet 1,000 Units
1000 | Freq: Every day | ORAL | Status: AC
Start: 2016-04-22 — End: 2016-04-22
  Administered 2016-04-22: 13:00:00 1000 [IU] via ORAL

## 2016-04-22 MED ORDER — clonazePAM (klonoPIN) tablet 3 mg
1 | Freq: Every evening | ORAL | Status: AC
Start: 2016-04-22 — End: 2016-04-22
  Administered 2016-04-22: 07:00:00 3 mg via ORAL

## 2016-04-22 MED FILL — TRAZODONE 100 MG TABLET: 100 100 MG | ORAL | Qty: 1

## 2016-04-22 MED FILL — PANTOPRAZOLE 40 MG TABLET,DELAYED RELEASE: 40 40 MG | ORAL | Qty: 1

## 2016-04-22 MED FILL — RISPERIDONE 3 MG TABLET: 3 3 MG | ORAL | Qty: 1

## 2016-04-22 MED FILL — CLONAZEPAM 1 MG TABLET: 1 1 MG | ORAL | Qty: 3

## 2016-04-22 MED FILL — THERA 400 MCG TABLET: 400 400 mcg | ORAL | Qty: 1

## 2016-04-22 MED FILL — VITAMIN D3 25 MCG (1,000 UNIT) TABLET: 25 25 mcg (1,000 unit) | ORAL | Qty: 1

## 2016-04-22 MED FILL — LITHIUM CARBONATE ER 450 MG TABLET,EXTENDED RELEASE: 450 450 MG | ORAL | Qty: 1

## 2016-04-22 MED FILL — CLONAZEPAM 0.5 MG TABLET: 0.5 0.5 MG | ORAL | Qty: 1

## 2016-04-22 NOTE — ED Notes (Signed)
PT took all scheduled HS medications. She used the restroom and returned to bed. PT given walker for ambulation. She reports use of walker at home. PT currently resting in bed. Will continue to monitor per unit policy.

## 2016-04-22 NOTE — ED Notes (Signed)
Brenda [name] is a 60 y.o.  White or Caucasian female presents to [facility] due to out of control behavior. Admit to observation and treatment area. Safety checks started, belongings secured, oriented to unit, verbally reassured. Patient presents age appropriate, impulsive, inappropriate affect, impaired judgment and impaired insight, alert. Patient reports the following stressors health. [facility]. PT has DX of Huntington's disease. PT's husband reports PT has had significant change in behavior in the last couple days. She has been delusional and paranoid. She has been agitated and refusing medications. PT is not able to participate in interview much. She states the hospital is playing games with her. She does not know what her medications are. She went to [facility] initially for chest pain but currently she denies any pain. She has been given a bed to rest and yellow socks. Side rails raise for fall risk.

## 2016-04-22 NOTE — Unmapped (Signed)
Phone call to [facility].  [phone].  Spoke to [place].  The nursing home is willing to accept her back.  They would like nursing report and ETA when she is to return.      Phone call to pt's husband Kathlene November [phone].  Informed him pt would be discharged back to the nursing home.  He is very upset and wanted to speak to the psychiatrist.  The call was transferred to her.

## 2016-04-22 NOTE — ED Notes (Signed)
Social Work Note:    SW called pt's husband Devri Kingston and notified of transport back to [facility] @ 10:30AM.

## 2016-04-22 NOTE — ED Notes (Addendum)
[facility] up on unit to transfer patient to [facility] per Doctor, general practice. Safety maintained. The patient left the OTA with all belongings.

## 2016-04-22 NOTE — Unmapped (Signed)
Dementia  Dementia is a general term for problems with brain function. A person with dementia has memory loss and a hard time with at least one other brain function such as thinking, speaking, or problem solving. Dementia can affect social functioning, how you do your job, your mood, or your personality. The changes may be hidden for a long time. The earliest forms of this disease are usually not detected by family or friends.  Dementia can be:  ?? Irreversible.  ?? Potentially reversible.  ?? Partially reversible.  ?? Progressive. This means it can get worse over time.  CAUSES   Irreversible dementia causes may include:  ?? Degeneration of brain cells (Alzheimer disease or Lewy body dementia).  ?? Multiple small strokes (vascular dementia).  ?? Infection (chronic meningitis or Creutzfeldt-Jakob disease).  ?? Frontotemporal dementia. This affects younger people, age 55 to 58, compared to those who have Alzheimer disease.  ?? Dementia associated with other disorders like Parkinson disease, Huntington disease, or HIV-associated dementia.  Potentially or partially reversible dementia causes may include:  ?? Medicines.  ?? Metabolic causes such as excessive alcohol intake, vitamin B12 deficiency, or thyroid disease.  ?? Masses or pressure in the brain such as a tumor, blood clot, or hydrocephalus.  SIGNS AND SYMPTOMS   Symptoms are often hard to detect. Family members or coworkers may not notice them early in the disease process. Different people with dementia may have different symptoms. Symptoms can include:  ?? A hard time with memory, especially recent memory. Long-term memory may not be impaired.  ?? Asking the same question multiple times or forgetting something someone just said.  ?? A hard time speaking your thoughts or finding certain words.  ?? A hard time solving problems or performing familiar tasks (such as how to use a telephone).  ?? Sudden changes in mood.  ?? Changes in personality, especially increasing moodiness or  mistrust.  ?? Depression.  ?? A hard time understanding complex ideas that were never a problem in the past.  DIAGNOSIS   There are no specific tests for dementia.   ?? Your health care provider may recommend a thorough evaluation. This is because some forms of dementia can be reversible. The evaluation will likely include a physical exam and getting a detailed history from you and a family member. The history often gives the best clues and suggestions for a diagnosis.  ?? Memory testing may be done. A detailed brain function evaluation called neuropsychologic testing may be helpful.  ?? Lab tests and brain imaging (such as a CT scan or MRI scan) are sometimes important.  ?? Sometimes observation and re-evaluation over time is very helpful.  TREATMENT   Treatment depends on the cause.   ?? If the problem is a vitamin deficiency, it may be helped or cured with supplements.  ?? For dementias such as Alzheimer disease, medicines are available to stabilize or slow the course of the disease. There are no cures for this type of dementia.  ?? Your health care provider can help direct you to groups, organizations, and other health care providers to help with decisions in the care of you or your loved one.  HOME CARE INSTRUCTIONS  The care of individuals with dementia is varied and dependent upon the progression of the dementia. The following suggestions are intended for the person living with, or caring for, the person with dementia.  ?? Create a safe environment.  ?? Remove the locks on bathroom doors to prevent the person  from accidentally locking himself or herself in.  ?? Use childproof latches on kitchen cabinets and any place where cleaning supplies, chemicals, or alcohol are kept.  ?? Use childproof covers in unused electrical outlets.  ?? Install childproof devices to keep doors and windows secured.  ?? Remove stove knobs or install safety knobs and an automatic shut-off on the stove.  ?? Lower the temperature on water  heaters.  ?? Label medicines and keep them locked up.  ?? Secure knives, lighters, matches, power tools, and guns, and keep these items out of reach.  ?? Keep the house free from clutter. Remove rugs or anything that might contribute to a fall.  ?? Remove objects that might break and hurt the person.  ?? Make sure lighting is good, both inside and outside.  ?? Install grab rails as needed.  ?? Use a monitoring device to alert you to falls or other needs for help.  ?? Reduce confusion.  ?? Keep familiar objects and people around.  ?? Use night lights or dim lights at night.  ?? Label items or areas.  ?? Use reminders, notes, or directions for daily activities or tasks.  ?? Keep a simple, consistent routine for waking, meals, bathing, dressing, and bedtime.  ?? Create a calm, quiet environment.  ?? Place large clocks and calendars prominently.  ?? Display emergency numbers and home address near all telephones.  ?? Use cues to establish different times of the day. An example is to open curtains to let the natural light in during the day. ??  ?? Use effective communication.  ?? Choose simple words and short sentences.  ?? Use a gentle, calm tone of voice.  ?? Be careful not to interrupt.  ?? If the person is struggling to find a word or communicate a thought, try to provide the word or thought.  ?? Ask one question at a time. Allow the person ample time to answer questions. Repeat the question again if the person does not respond.  ?? Reduce nighttime restlessness.  ?? Provide a comfortable bed.  ?? Have a consistent nighttime routine.  ?? Ensure a regular walking or physical activity schedule. Involve the person in daily activities as much as possible.  ?? Limit napping during the day.  ?? Limit caffeine.  ?? Attend social events that stimulate rather than overwhelm the senses.  ?? Encourage good nutrition and hydration.  ?? Reduce distractions during meal times and snacks.  ?? Avoid foods that are too hot or too cold.  ?? Monitor chewing and swallowing  ability.  ?? Continue with routine vision, hearing, dental, and medical screenings.  ?? Give medicines only as directed by the health care provider.  ?? Monitor driving abilities. Do not allow the person to drive when safe driving is no longer possible.  ?? Register with an identification program which could provide location assistance in the event of a missing person situation.  SEEK MEDICAL CARE IF:   ?? New behavioral problems start such as moodiness, aggressiveness, or seeing things that are not there (hallucinations).  ?? Any new problem with brain function happens. This includes problems with balance, speech, or falling a lot.  ?? Problems with swallowing develop.  ?? Any symptoms of other illness happen.  Small changes or worsening in any aspect of brain function can be a sign that the illness is getting worse. It can also be a sign of another medical illness such as infection. Seeing a health care provider right away  is important.  SEEK IMMEDIATE MEDICAL CARE IF:   ?? A fever develops.  ?? New or worsened confusion develops.  ?? New or worsened sleepiness develops.  ?? Staying awake becomes hard to do.     This information is not intended to replace advice given to you by your health care provider. Make sure you discuss any questions you have with your health care provider.     Document Released: 12/15/2000 Document Revised: 07/12/2014 Document Reviewed: 11/16/2010  Elsevier Interactive Patient Education ??2016 [employer]    Huntington Disease  Huntington disease is a rare genetic condition that causes nerve cells in certain areas of the brain to deteriorate. The disease gets worse over time. It gradually affects your ability to walk, speak, and think.  Everyone has the gene involved with Huntington disease (HTT gene). People who get Huntington disease have an expanded gene. This means that a certain segment of the gene is longer than normal. Everyone who has this expanded segment eventually gets Huntington  disease.  There are two types of Huntington disease:  ?? Adult-onset Huntington disease.  ?? This is the most common form of the disease.  ?? It usually begins at age 1-50 years.  ?? Early-onset Huntington disease.  ?? This type is extremely rare.  ?? It is also called juvenile Huntington disease.  ?? Symptoms begin in childhood or adolescence.  Huntington disease is passed down through families (inherited).  CAUSES  A mutation in the HTT gene causes Huntington disease.  RISK FACTORS  You are at a greater risk for Huntington disease if you:  ?? Have a parent with Huntington disease.  ?? Are of European descent.  SIGNS AND SYMPTOMS  The symptoms of Huntington disease get worse over time. At first, symptoms may include:  ?? Twitching movements.  ?? Loss of coordination.  ?? Loss of memory.  ?? Difficulty making decisions.  ?? Depression.  ?? Irritability.  Later symptoms include:  ?? Worsening mental condition and abilities.  ?? More pronounced jerking and twitching movements.  ?? Loss of ability to walk or move.  ?? Not being able to speak.  ?? Not being able to swallow.  DIAGNOSIS  Your health care provider may suspect Huntington disease based on your signs, symptoms, and family history. You will also have a physical exam to check for abnormal movements or reflexes and abnormal speech. Your health care provider may also do tests to confirm the diagnosis and to rule out other conditions. These may include:  ?? Neurological exams.  ?? Psychological tests.  ?? Imaging studies of your brain including CT, MRI, or PET scans.  ?? Genetic testing.  TREATMENT  There is no cure for Huntington disease. Symptoms get progressively worse. Treatment aims to provide the best quality of life possible with the disease. Options include:  ?? Medicines to help with movement and emotional problems.  ?? Appointments with psychologists and counselors for mental health.  ?? Physical therapy and occupational therapy to help you move and function as well as  possible.  HOME CARE INSTRUCTIONS  ?? Exercise regularly as directed by your health care provider. Being physically fit can help to manage your symptoms. Ask your health care provider to recommend some activities that would be good for you.  ?? Take medicines only as directed by your health care provider.  ?? Keep all follow-up visits as directed by your health care provider. This is important. This includes appointments with your physical and occupational therapists and any  mental health professionals.  ?? Have a good support system in place. Consider joining a Huntington Disease support group.  ?? Talk with a genetic specialist to help with decisions regarding family planning, if this applies. Genetic testing is available to your family members (blood relatives) if they choose to find out if they carry the gene mutation. Genetic testing can also help to determine if they are likely to develop the condition in the future or pass on this gene mutation to their children.  SEEK MEDICAL CARE IF:  ?? You have new symptoms.  ?? Your memory is worse.  ?? Your ability to speak, walk, or swallow is worse.  ?? You do not feel that you can care for your medical needs at home on your own.  ?? You feel depressed.     This information is not intended to replace advice given to you by your health care provider. Make sure you discuss any questions you have with your health care provider.     Document Released: 06/11/2002 Document Revised: 07/12/2014 Document Reviewed: 11/21/2013  Elsevier Interactive Patient Education ??2016 [employer].

## 2016-04-22 NOTE — ED Notes (Signed)
Pt was easily awakened, disorientated to place. Pt was agitated, complaining of having loose stools at nighttime but reported it didn't occur last night. AM meds given, PRN Clonazepam 0.5 mg given for agitation. Pt began eating breakfast too quickly, shoving large amounts of food into her mouth at once, had to be reminded to slow down. She requires monitoring during meals. Pt not tolerating interview at this time. Side rails are padded. Will continue to monitor for safety per floor protocol.

## 2016-04-22 NOTE — ED Notes (Signed)
PT did wake to use bathroom and returned to bed. She is currently sleeping. No needs or complaints voiced. Will continue to monitor.

## 2016-04-22 NOTE — Unmapped (Signed)
[facility] Psychiatric Emergency  Service Evaluation    Reason for Visit/Chief Complaint: Altered Mental Status      Patient History     HPI   Per [facility] note  This is a 60 y.o. female with history of Huntington's disease, anxiety, asthma, reflux presenting with chest pain and agitation.  Patient currently resides at a facility and reportedly was refusing to take her medications this morning which include Risperdal, Klonopin and Prilosec.  Throughout the day patient was becoming more agitated where this evening she began yelling that I'm having a heart attack, this is my reflux.  EMS was called given her complaints of chest pain.  When EMS arrived she was initially refusing to be transported to have an EKG.  She eventually took her home Risperdal and Klonopin with some improvement in her symptoms.  Pre-hospital EKG was performed that did not show any acute cardiac abnormalities.  Upon arrival to the emergency department, the patient continually states that she has reflux and they have not been giving her her Prilosec at the nursing facility.  Husband arrived at the bedside later and reported that patient normally perseverates and this is her baseline.     On interview  Overall patient is cooperative and interacts appropriately. Notable choreiform movements on exam. Patient, when asked if she knew where she was states hospital. When asked reason for admission she replies I have Huntington's,  I can't walk on my own. Patient states that she would like to go back to my home at [facility]. She is not expressing any overt paranoia to me. She perseverates on returning home. States chest pain is improved. She ask for her night time medications. In fact became insistent on taking them and was displeased with delay in care.     Overnight in PES  She initially wanders around unit but is redirectable. She is able to correctly identify and go to the bathroom without assistance from staff. She did hit her leg on side of bed  rail due to choreiform movements. Superficial scrapes on shin were evaluated, after which patient became more irritable repeatedly requesting band-aid. Patient did calm after band-aid was placed and needs met. No unsafe behaviors expressed while here on unit.     Collateral  Based on the above assessment initially plan was to discharge patient back to nursing home; as patient appears improved to baseline and she does not present an imminent danger to self.  Spoke at length with patient's husband. Patient husband was very upset at our plan to discharge patient and did not believe patient should be discharge tonight. Husband insist that patient requires further evaluation by behavioral services, because of patient 1-2 days of mental status change. He is concern that recent acute paranoia is interfering with care. Patient husband insist patient is a risk to self because she is refusing medication. Husband had expectation that patient would be kept overnight. He is concerned that transferring patient back to nursing home at this hour due to concern for patient comfort and would like patient to remain in ED for further observation. Agree to keep patient overnight for patient comfort. Husband was advised that patient would most likely not be admitted inpatient as she has been acting appropriately here, thus medication adjustments may be managemed on an outpatient bases where patient is in more familiar setting. Husband insist that he be given documentation for reason for discharge.     PES Triage Screening:  Broset score: Risk Level: Very Low (0-3)  PSS- Safety Screen Score: 0  Suicide Screen: Is patient expressing suicidal ideations?: No    Is Admission due to self harm?: No    Has Patient Attempted Suicide or Self Harm in past 72 hours?: No    Is Patient experiencing acute agitation, anxiety or insomnia?  : No    Context: stress and nonadherence with meds  Location: Altered mental status of mood and  cognition  Duration: 2 days.  Severity: moderate .  Associated Symptoms: moderate .  Modifying Factors: medication noncompliance .  Timing: Constant.       Past Psychiatric History:  Per SW note   Pt was diagnosed with Huntington's disease in 2012.  This has affected pt mentally as well ,and pt currently takes psychiatric medication. Pt was admitted onto [facility] inpt psych unit four years ago for six days due to a similar episode. Pt responded well to medication changes per husband.  Husband said pt was seeing a psychiatrist but that psychiatrist will not call him back. Pt is scheduled to see a new psychiatrist 05/04/16.      Substance Use History: denies    PMH:       Past Medical History:   Diagnosis Date   ??? Anxiety    ??? Asthma    ??? Chronic sinusitis 05/24/2011   ??? Environmental allergies    ??? Esophageal reflux 08/10/2010   ??? Fatigue 05/24/2011   ??? Fracture of left orbit 2010   ??? Hematoma, left eyebrow 04/18/2009   ??? 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 Osteopenia 08/10/2010   ??? Superficial injury of face without infection 04/12/2009     I have reviewed the past medical history.  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 Marital status: Married     Spouse name: N/A   ??? Number of children: N/A   ??? 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 Smoking status: Never Smoker   ??? Smokeless tobacco: Never Used   ??? Alcohol use 0.6 oz/week     1 Cans of beer per week      Comment: Occasionally   ??? Drug use: No   ??? Sexual activity: Yes     Partners: Male     Other Topics Concern   ??? Caffeine Use Yes   ??? Occupational Exposure No   ??? Exercise Yes   ??? Seat Belt Yes     Social History Narrative   ??? None     I have reviewed the past social history.  Additional history obtained: no.    Family History:    Family History   Problem Relation Age of Onset   ??? Pulmonary  embolism Mother    ??? Huntington's disease Mother 42   ??? Alzheimer's disease Father    ??? Depression Sister    ??? Suicidality Sister      attemped suicide 2005   ??? Anxiety disorder Sister    ??? Huntington's disease Maternal Uncle      I have reviewed the past family history.  Additional history obtained: no.    Medications:  Previous Medications    CHOLECALCIFEROL, VITAMIN D3, 1000 UNITS TABLET    Take 1,000 Units by mouth daily.  CLONAZEPAM (KLONOPIN) 0.5 MG TABLET    Take 1 tablet (0.5 mg total) by mouth 2 times a day as needed (agitation , must be at least one hour since scheduled or prn clonazepam dose given).    CLONAZEPAM (KLONOPIN) 1 MG TABLET    Take 3 tablets (3 mg total) by mouth at bedtime.    LITHIUM (ESKALITH) 450 MG CR TABLET    Take 450 mg by mouth At bedtime.    MIRABEGRON (MYBETRIQ) 50 MG TB24    Take 50 mg by mouth daily. Indications: Bladder Hyperactivity    MULTIVITAMIN (THERAGRAN) TABLET    Take 1 tablet by mouth daily.      OMEPRAZOLE 20 MG TBEC    Take 40 mg by mouth daily.    RISPERIDONE (RISPERDAL) 1 MG TABLET    Take 3 mg by mouth at bedtime.    TRAZODONE (DESYREL) 100 MG TABLET    Take 1 tablet (100 mg total) by mouth at bedtime.       Allergies:   Allergies as of 04/22/2016 - Fully Reviewed 04/22/2016   Allergen Reaction Noted   ??? Zithromax [azithromycin] Anaphylaxis 06/08/2011   ??? Bactrim [sulfamethoxazole-trimethoprim] Nausea Only and Hives 06/08/2011   ??? Erythromycin Diarrhea 07/30/2011   ??? Erythromycin base Diarrhea and Nausea And Vomiting    ??? Methylphenidate Hives 05/08/2013   ??? Sulfa (sulfonamide antibiotics) Hives 03/26/2011       Review of Systems     Review of Systems   Unable to perform ROS: Dementia   Cardiovascular: Negative for chest pain.       Physical Exam/Objective Data     ED Triage Vitals   Vital Signs Group      Temp       Temp src       Pulse       Heart Rate Source       Resp       SpO2       BP       BP Location       BP Method       Patient Position    SpO2    O2  Device        Physical Exam   Constitutional: No distress.   frail appearing   HENT:   Head: Normocephalic and atraumatic.   Eyes: Conjunctivae are normal. Pupils are equal, round, and reactive to light.   Neck: Normal range of motion.   Cardiovascular: Normal rate and intact distal pulses.    Pulmonary/Chest: Effort normal. No respiratory distress.   Abdominal: Soft. She exhibits no distension.   Musculoskeletal: She exhibits no edema or deformity.   Neurological: She exhibits abnormal muscle tone. Coordination abnormal.   Skin: Skin is warm and dry. No rash noted. She is not diaphoretic.       Mental Status Exam:     Gait and Muscle Strength:  ataxic  Appearance and Behavior: Calm and Cooperative      Groomed  Speech: mumbled and slow speech and increased volume  Language: Impaired  Mood: appropriate and with irritable edge  Affect: appropriate and constricted  Thought Process and Associations: poverty of thought and perseverative       No loose associations  Thought Content: no suicidal/homicidal with plan and perservates on going home and night time medications  Abnormal or psychotic thoughts: patient not RTIS  Orientation: person  Memory: impaired  Attention and Concentration: impaired  Abstraction: Impaired  Fund of Knowledge:  below average  Insight and Judgement: Minimal     Impaired        Labs:    Please see electronic medical record for any tests performed in the ED.    Recent Results (from the past 24 hour(s))   Basic metabolic panel    Collection Time: 04/21/16  7:58 PM   Result Value Ref Range    Sodium 142 133 - 146 mmol/L    Potassium 3.8 3.5 - 5.3 mmol/L    Chloride 105 98 - 110 mmol/L    CO2 27 21 - 33 mmol/L    Anion Gap 10 3 - 16 mmol/L    BUN 15 7 - 25 mg/dL    Creatinine 1.61 0.96 - 1.30 mg/dL    Glucose 045 (H) 70 - 100 mg/dL    Calcium 9.6 8.6 - 40.9 mg/dL    Osmolality, Calculated 295 278 - 305 mOsm/kg    eGFR AA CKD-EPI >90 See note.    eGFR NONAA CKD-EPI 87 See note.   CBC    Collection  Time: 04/21/16  7:58 PM   Result Value Ref Range    WBC 8.3 3.8 - 10.8 10E3/uL    RBC 4.55 3.80 - 5.10 10E6/uL    Hemoglobin 13.6 11.7 - 15.5 g/dL    Hematocrit 81.1 91.4 - 45.0 %    MCV 88.3 80.0 - 100.0 fL    MCH 30.0 27.0 - 33.0 pg    MCHC 34.0 32.0 - 36.0 g/dL    RDW 78.2 95.6 - 21.3 %    Platelets 233 140 - 400 10E3/uL    MPV 9.3 7.5 - 11.5 fL   Differential    Collection Time: 04/21/16  7:58 PM   Result Value Ref Range    Neutrophils Relative 74.2 40.0 - 80.0 %    Lymphocytes Relative 14.7 (L) 15.0 - 45.0 %    Monocytes Relative 10.0 0.0 - 12.0 %    Eosinophils Relative 0.7 0.0 - 8.0 %    Basophils Relative 0.4 0.0 - 1.0 %    nRBC 0 0 - 0 /100 WBC    Neutrophils Absolute 6,159 1,500 - 7,800 /uL    Lymphocytes Absolute 1,220 850 - 3,900 /uL    Monocytes Absolute 830 200 - 950 /uL    Eosinophils Absolute 58 15 - 500 /uL    Basophils Absolute 33 0 - 200 /uL   Troponin I    Collection Time: 04/21/16  7:58 PM   Result Value Ref Range    Troponin I <0.04 0.00 - 0.03 ng/mL   B Natriuretic Peptide    Collection Time: 04/21/16  7:58 PM   Result Value Ref Range    BNP 18 0 - 100 pg/mL   Urinalysis-Macroscopic w/Rfx to Microsco    Collection Time: 04/21/16  9:56 PM   Result Value Ref Range    Color, UA Yellow Yellow,Straw    Clarity, UA Clear Clear    Specific Gravity, UA >=1.030 1.005 - 1.035    pH, UA 6.0 5.0 - 8.0    Protein, UA 30 mg/dL (A) Negative mg/dL    Glucose, UA Negative Negative mg/dL    Ketones, UA 40 mg/dL (A) Negative mg/dL    Bilirubin, UA Small (A) Negative    Blood, UA Negative Negative    Nitrite, UA Negative Negative    Urobilinogen, UA 0.2 E.U./dL 0.2 - 1.0 EU/dL    Leukocytes, UA Negative Negative   Urinalysis, Microscopic  Collection Time: 04/21/16  9:56 PM   Result Value Ref Range    RBC, UA 2 0 - 3 /HPF    WBC, UA 1 0 - 5 /HPF    Squam Epithel, UA <1 /HPF 0 - 5 /HPF    Mucus, UA Present (A) None Seen /HPF       Radiology and EKG:  No results found.    EKG: Please see electronic medical record  for any studies performed in the ED.    Emergency Course and Plan     SAFIRA PROFFIT is a 60 y.o. female who presented to the emergency department with Altered Mental Status  She initially presented to ED with chest pain and 2 days history of AMS. AMS is characterized as worsening paranoia and irritability; She has been refusing medication at nursing home believing that the nursing are giving her substances that are making her sick. Per husband, she has been very agitated -- jumping up and down and yelling at nurses, throwing objects -- almost to the point of being combative. Patient was brought to ED and medical work up was performed and found to be negative. Her chest pain was treated with Prilosec as patient has history of indigestion.     On arrival to [facility] patient was interacting appropriately with staff. Patient tolerated transport to PES well, with no difficulty. Here she is irritable at times when needs are not met, but is able to be redirected. She has been cooperative with care and not voiced any paranoia while here.  Per review of chart, she appears to be approaching behavioral baseline with treatment of indigestion. She has not demonstrated need for admission as evidenced by unsafe behavior and does not appear to be an imminent threat to self or others. Furthermore, at this point in time, patient's agitation can be managed more safely in a familiar outpatient setting with medication adjustment by providers who are more aquatinted with specific needs of patient.  Will observe overnight per patient's husband's request. However anticipate discharge in morning provided patient remains stable for discharge.      Diagnosis:    Primary psychiatric Diagnosis: major neurocognitive disorder with behavioral disturbance, huntington disease  Other psychiatric Diagnoses: deffered  Substance Use Diagnoses: none  Medical Diagnoses: reflux, dysphagia    Global assessment of functioning: 41-50 serious symptoms    Disposition:       ED Observation   Summary of rationale for disposition: see above.    Provider completing note: Resident, supervised by Dr. Janina Mayo.    Patient was in OTA.    Patient had a completed Statement of Belief during this encounter: yes  not yet released.    Medications given in PES: yes.  Medications prescribed for home or inpatient use: yes.  Laboratory work ordered: yes.  Other diagnostic studies ordered: no.  Old and/or outside medical records reviewed: yes.  Collateral information contacted: yes.  Patient's outside provider contacted: no.         Clide Dales, MD  Resident  04/22/16 [phone]

## 2016-04-22 NOTE — ED Notes (Signed)
Sleeping quietly on stretcher. Easily awakens. No SI/HI and A/VH voiced. Calm and cooperative. Up to bathroom. Abnormal gait. Gait steady with walker. Safety maintained. Side rails padded for safety. Will continue to monitor.

## 2016-04-22 NOTE — ED Notes (Signed)
Report called to 6th floor. Spoke with Rulon Eisenmenger.

## 2016-04-29 DIAGNOSIS — K1120 Sialoadenitis, unspecified: Secondary | ICD-10-CM | POA: Insufficient documentation

## 2016-05-03 NOTE — Telephone Encounter (Signed)
Pt husband calling to get pt first ov date with MD, pls advise

## 2016-05-03 NOTE — Telephone Encounter (Signed)
Left message, reminder call for appt tomorrow.

## 2016-05-03 NOTE — Telephone Encounter (Signed)
Returned call to [place].  Left OV of 09/13/13 was the first visit with Dr. Gerilyn Pilgrim on his VM.

## 2016-05-04 ENCOUNTER — Ambulatory Visit: Admit: 2016-05-04 | Payer: PRIVATE HEALTH INSURANCE

## 2016-05-04 ENCOUNTER — Encounter

## 2016-05-04 DIAGNOSIS — R4689 Other symptoms and signs involving appearance and behavior: Secondary | ICD-10-CM

## 2016-05-04 MED ORDER — folic acid-vit B6-vit B12 (FOLTX) 2.5-25-2 mg tablet
2.5-25-2 | ORAL_TABLET | Freq: Every day | ORAL | 0 refills | Status: AC
Start: 2016-05-04 — End: 2016-05-04

## 2016-05-04 NOTE — Telephone Encounter (Signed)
done

## 2016-05-04 NOTE — Telephone Encounter (Signed)
Patient is going downstairs tomorrow to get labs drawn from another doctor. He has ordered everything except a CBC. Can you order a CBC for her? [facility] And wants to have all her labs drawn at one time.

## 2016-05-04 NOTE — Progress Notes (Signed)
Psychiatry Mood Disorder Clinic Initial Evaluation  Brenda [name]    CC: Can you give me some water? I'm not depressed or anything      HPI   Brenda [name] is a 60 y.o. 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 Patrecia Pour, for evaluation of her psychiatric symptoms and overall heightened anxiety as well as her increased intake in fluids despite medical consequences. The patient was actually hospitalized on the medical unit of [facility] from 03/08/16-03/16/16 due to altered mental status, weakness and hyponatremia due to polydipsia presumably from progression of her Huntington's Disease, which was first diagnosed in 2012 after a 2 year period of showing some early symptoms. She was discharged from [facility] to a SNF to enable a higher level of care for the patient. She was again seen in the [facility] for evaluation of chest pain and agitation on 04/21/16  and ultimately transferred to [facility] where she was kept overnight and then discharged back to her SNF. It was noted that the patient had been agitated the morning leading up to her presentation to [facility] on 10/18 and had refused her Klonapin and Risperdal and then complained of chest pain. She reportedly took her Klonapin and Risperdal thereafter and complaints of chest pain dissipated, though a full cardiac workup supported a non cardiac origin of her complaints of chest pain. She was transferred to [facility] for evaluation of her psychiatric presentation and was noted to behave in a calm and cooperative manner without voicing any paranoid delusions. She was kept overnight in [facility] at her husband's request, as he felt she had been increasingly agitated and paranoid in the days leading up to her [facility] visit.     Today, the patient presents as well groomed with fair hygiene and fair eye contact with her husband, Brenda [name], present for  her assessment. She reports she would like for him to stay present for her assessment. She is generally a poor historian and Brenda [name] helps provide information when she is unable to answer questions. For example, she answers most questions by asking if she may have a drink of water or by asking where the bathroom is. She was helped to the bathroom at one point in the middle of assessment but continued to express concerns she may need to use the bathroom again because she is worried she may have diarrhea. Brenda [name] insists she has had no change in her bowel habits and cites her concern over potential diarrhea as one of the reasons he was concerned she was having some paranoia. 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 She reports having slowed cognition with the progression of her illness. The patient also says she has no prior diagnosis of psychiatric maladies prior to her [facility] visit, though she does note she has a sister who suffers from depression and has attempted suicide in the past. The patient's husband agrees that the patient did not exhibit symptoms of psychiatric illness prior to development of Huntington's Disease, and cites the time around Labor Day weekend 2017 as being the time when she increased her water intake to such a great extent that she ended up in the hospital with hyponatremia as noted above. Prior to discharge in September 2017 from [facility] to the SNF (initially [facility], though she transferred to [facility] 2 days ago), the patient had been living at home with her husband.     The patient does note some day time fatigue, but is noted to be on high doses of both Risperdal and Klonapin. 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 She notes and exhibits some difficulty with concentration, consistent with her neurocognitive impairments.     Please note, on extensive assessment, the patient and her husband deny she has had neurovegetative symptoms of depression including: -anhedonia, -appetite changes, -sleep changes, -PMR/PMA, -SI. The patient and her husband deny current or past manic symptoms including: -elevated mood, -increased energy, -decreased need for sleep, -rapid speech, -racing thoughts, -impulsivity, -grandiosity, -severe irritability. The patient and her husband deny past or current psychotic symptoms including: -evidence of delusional thinking, -hallucinations. She does, however, exhibit anxious ruminations as noted as she repeatedly asks for water and focuses on somatic concerns (such as potential diarrhea) throughout the assessment.    Patient denies suicidal and homicidal ideation and symptoms of psychosis including auditory and visual hallucinations. Patient denies past SI/HI as well.  Patient is contracting for safety and is future oriented to return to her nursing facility and play games with her peers this afternoon.             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 Corinne Ports per family- in [place]- for a couple years   Prior medication trials: Patient placed on Klonapin and Risperdal by Dr. Corinne Ports, as well as Lithium 450mg  q HS about a year ago, though this was discontinued after her visit to [facility] due to hyponatremia due to concerns over side effect of excess thirst affiliated with the medicine   Outpatient Treatment: Dr.Chughtai as  noted above, though husband was less than pleased when she never returned phone calls and so he decided to seek a transfer of care   Suicide Attempts: none reported   Self Harm Behavior: none reported        Substance Abuse History    Nicotine: denies   Alcohol: denies current use- used to drink a couple of alcoholic beverages a week, socially   Illicits: denies   Caffeine: denies    Past Medical/Surgical History     Past Medical History:   Diagnosis Date    Anxiety     Asthma     Chronic sinusitis 05/24/2011    Environmental allergies     Esophageal reflux 08/10/2010    Fatigue 05/24/2011    Fracture of left orbit (HCC) 2010    Hematoma, left eyebrow 04/18/2009    Huntington's chorea (HCC) 08/11/2010    Osteopenia 08/10/2010    Superficial injury of face without infection 04/12/2009     Past Surgical History:   Procedure Laterality Date    SINUS SURGERY      TONSILLECTOMY           Social/Developmental History       Marital: married for 36 years to Brenda [name]; has 2 grown children and 2  grandchildren who live in the area   Housing: lives in [place] as noted above; prior to September 2017, was living at home with husband   Occupation/Income: retired Engineer, 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)   Education: Education administrator in Education              Religion: spiritual   Legal history: none reported   Abuse/Trauma history: none reported   Violence history: none reported   Hotel manager history: NA   Access to guns: denies     Family History     Family History   Problem Relation Age of Onset    Pulmonary embolism Mother     Huntington's disease Mother 54    Alzheimer's disease Father     Depression Sister     Suicidality Sister      attemped suicide 2005    Anxiety disorder Sister     Huntington's disease Maternal Uncle          Allergies     Allergies   Allergen Reactions    Zithromax [Azithromycin] Anaphylaxis     Other reaction(s): Other (See  Comments)  abdominal pain    Bactrim [Sulfamethoxazole-Trimethoprim] Nausea Only and Hives    Erythromycin Diarrhea    Erythromycin Base Diarrhea and Nausea And Vomiting    Methylphenidate Hives    Sulfa (Sulfonamide Antibiotics) Hives       Medications     Current Outpatient Prescriptions on File Prior to Visit   Medication Sig Dispense Refill    cholecalciferol, vitamin D3, 1000 units tablet Take 1,000 Units by mouth daily.      clonazePAM (KLONOPIN) 0.5 MG tablet Take 1 tablet (0.5 mg total) by mouth 2 times a day as needed (agitation , must be at least one hour since scheduled or prn clonazepam dose given). 10 tablet 0    mirabegron (MYBETRIQ) 50 mg Tb24 Take 50 mg by mouth daily. Indications: Bladder Hyperactivity      multivitamin (THERAGRAN) tablet Take 1 tablet by mouth daily.        omeprazole 20 mg TbEC Take 40 mg by mouth daily.      risperiDONE (RISPERDAL) 1 MG tablet Take 3 mg by mouth at bedtime.      traZODone (DESYREL) 100 MG tablet Take 1 tablet (100 mg total) by mouth at bedtime. 5 tablet 0    clonazePAM (KLONOPIN) 1 MG tablet Take 3 tablets (3 mg total) by mouth at bedtime. 12 tablet 0    lithium (ESKALITH) 450 MG CR tablet Take 450 mg by mouth At bedtime.       No current facility-administered medications on file prior to visit.        ROS   Constitutional:  Denies fatigue at present but notes some mild increase in daytime fatigue for several weeks. Denies fever/chills.  Denies pain or physical complaint.  Noted mild to moderate choreiform movements in bilateral arms but she holds them together through assessment; c/o of excessive, ongoing thirst  Cardiovascular: Denies chest pain/palpitations.  Respiratory: Denies shortness of breath/cough.    ENT: Denies hearing loss/nasal discharge/sore throat. Denies blurred vision.  Gastrointestinal: Denies nausea, vomiting, constipation, dyspepsia.  Denies appetite changes. Notes some difficulty with swallowing solids and has switched to puree  food. C/O fear of diarrhea, though husband says she has not been having diarrhea in the days and weeks leading up to assessment. Denies abdominal pain  Genitourinary:  Denies dysuria.  Denies polyuria.     Integumentary: Denies rash.   Hematological/Lymphatic: Denies bleeding/easy bruising.  Musculoskeletal:positive for choreiform movements as noted above   Neurological: Denies headache/tremor; is able to walk with assistance  Psychiatric ROS in HPI    Objective/Exam   Vitals:  Vitals:    05/04/16 1023   BP: 137/52   BP Location: Left arm   Patient Position: Sitting   BP Cuff Size: Regular   Pulse: 103   Weight: 141 lb 12.8 oz (64.3 kg)   Height: 5' 4.5 (1.638 m)       Wt Readings from Last 3 Encounters:   05/04/16 141 lb 12.8 oz (64.3 kg)   04/22/16 140 lb (63.5 kg)   03/16/16 149 lb (67.6 kg)     Body mass index is 23.96 kg/m.    Physical Exam:   Constitutional: well-developed and well-nourished, in no acute distress  Head: Normocephalic and atraumatic.   Eyes: EOM appear  normal.   Neck: Normal range of motion.   Pulmonary/Chest: Effort normal.   Musculoskeletal: Normal range of motion; no cogwheeling or rigidity noted on exam of bilateral upper extremities   Skin: Skin is warm and dry. No rashes    Mental Status Exam:   Appearance: appears stated age, wnl for weight, no physical stigmata, appropriate attire and grooming, hygiene, fair eye contact, cooperative with examiner.  Behavior/Movement/Muscle Tone: no psychomotor agitation or retardation, no tics, akathesia, tardive dyskenesia appreciated, normal muscle tone, normal gait with assistance; bilateral upper extremity notable for mild choreiform movements which patient controls by holding her arms together during assessment  Speech: normal rate, production, inflection, slightly slurred  Mood: good  Affect: appears euthymic; Full range, congruent with mood  Thought Process: circumstantial, tangential frequently,no looseness of association, no FOIs  Thought  Content: No suicidal or homicidal ideation expressed, no delusions expressed, no obsessions expressed, persistent rumination about wanting water and to use restroom with somatic concerns and perseveration (which husband says is baseline for her)  Perceptions: No auditory or visual hallucinations, not overtly responding to internal stimuli  Cognition:   -Sensorium - alert   -Orientation - person   -Memory/Attention/Concentration - impaired   -Fund Of Knowledge: appears consistent with average intellect   -Abstraction: Impaired   -Insight/Judgment: poor/fair redirection   -Impulse Control: limited    Labs     No results found for this or any previous visit (from the past 336 hour(s)). but labs from October visit to [facility] in 2017 reviewed; Care Everywhere indicates most recent labs were in June 2016 and many labs (ie: thyroid) yield no recent results    Imaging   no head imaging found on file- not in UC system or care everywhere    Axes   Axis I: Major Neurocognitive Disorder w/Behavioral Disturbance; Unspecified Anxiety Disorder; R/O Psychogenic Polydipsia  Axis II: Deferred  Axis III: Medical conditions contributing to psychological/ psychiatric problems  Axis IV: other psychosocial or environmental problems and chronic, debilitating medical disease impacting mental health  Axis V: GAF 41-50 serious symptoms      Assessment & Recommendations   60 y.o. 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 Patient is notably anxious during 90 minute assessment and repeatedly asks for water and asks to use the bathroom as  she was concerned she may have diarrhea despite no evidence of that preceding visit per her husband, Brenda [name], who is also present. Patient comes to clinic already  taking high doses of risperidone as well as a high dose of scheduled bedtime clonazepam in addition to PRN clonazepam. Per history, patient's interest in drinking excess water started around labor day and resulted in a medical hospitalization for hyponatremia in September of this year. Both patient and her husband say she had no psychiatric issues prior to 2012 when she began to suffer from a significant and steady physical/mental/cognitive decline due to her Huntington's Disease, which first began presenting in 2010. Patient currently residing in nursing care. She does deny SI/HI/AVH and collateral from husband supports this as well as that she has no access to firearms.    Psychiatric:    1. Major Neurocognitive Disorder with Behavioral Disturbance (r/o Psychogenic Polydipsia)    -Patient will continue to reside at [facility] indefinitely in order to have maximum level of care required due to her current physical/mental/cognitive state until her symptoms become more manageable; husband reports no future plans in terms of having her return home    -Patient should continue to be fluid restricted- reportedly fluid restricted at this time with monitored allowance so she may not overindulge in water intake and create electrolyte imbalance    -Provider has placed call to Dr. Jeanelle Malling, PCP, after ROI signed and faxed to her clinic. Dr. Jeanelle Malling is on vacation this week but provider plans to discuss patient with Dr. Jeanelle Malling with the hope that organic cause of polydipsia may be ruled out    -Placed lab orders for patient to have checked either at UC or at her nursing facility; husband agrees to have these labs checked prior to next visit; checking lipid panel, CMP, thyroid panel, cortisol, prolactin, ADH, Vit D level, and folic acid level as well as UA and urine sodium level    -Continue with Risperdal 3mg  q HS and Risperdal 1mg  q day    -Continue with Klonapin 3mg  q HS and Klonapin 0.5mg  TID PRN for  agitation    -Plan is to titrate these medications down after medical collaboration may be initiated with full review of psychiatric records from Dr. Barnie Del office (ROI faxed and record acquisition in process) with likely initiation of a SSRI to manage overall anxiety level with less need for benzodiazepine use and high dose atypical antipsychotic    -Encouraged patient to call with worsening symptoms, side effects, questions, or concerns.  Reviewed emergency contact protocol during and after clinic hours.  Extensively reviewed medications risks, benefits, side effects, and alternatives. The patient expressed understanding and agreement with current treatment and safety planning, which also includes [facility], 911, suicide hotline.    2. Unspecified Anxiety Disorder    -Continue with Risperdal 3mg  qHS and Risperdal 1mg  q day as above    -Continue with Klonapin 3mg  q HS and PRN Klonapin as noted above    -Plan is to titrate these medications down after medical collaboration may be initiated with full review of psychiatric records from Dr. Barnie Del office (ROI faxed and record acquisition in process) with likely initiation of a SSRI to manage overall anxiety level with less need for benzodiazepine use and high dose atypical antipsychotic      3. Substance Abuse/Dep/Withdrawal: no active issues    I have completed the required OARRS documentation for this patient on 05/07/2016. OARRs check, no evidence of inappropriate multiple medication prescriptions.  4. Medical:    -encouraged followup with PCP and labs as noted above    -encouraged follow up with specialists and continuance of medications she is using for her bladder as well as her GERD as directed by specialists         Future/Chronic Risk Assessment:    LOW based on the factors considered below.    RISK FACTORS: Age  >43, unemployed, chronic pain or medical illness, cognitive impairment    PROTECTIVE FACTORS: female gender, religious/moral objections to  suicide, married, denies depression, denies suicidal ideation, does not have lethal plan, does not have access to guns or weapons, patient is contracting for safety, no prior suicide attempts, no substance abuse, patient has social or family support, denies feeling  Trapped/hopeless/purposeless/worthless/guilty, has outpatient services in place/rendered, compliant with recommended medications, collateral information from husband confirms patient safety, and patient is future oriented with plans to return to her nursing facility and play games this afternoon.    The patient is currently clinically sober, denies SI/HI, denies access to firearms and collateral supports this, is psychiatrically stable, and has a reasonable follow up plan. Therefore, the patient does not present as an imminent risk of danger to herself or others at this time.    RTC in 2-3 weeks or earlier as needed.    Wilford Corner DO, PGY-3    This patient was seen and evaluated with attending physician Dr. Vivi Ferns, who was in agreement with the above assessment and plan.

## 2016-05-04 NOTE — Patient Instructions (Signed)
Please follow up with Dr. Jeanelle Malling, your PCP, as soon as possible for annual check up as well as evaluation/medical work up for psychogenic polydipsia to rule out organic causes.    Please get follow up labs as ordered.     Please continue with current medication regimen until you hear back from [place] on medication changes following our consult with your neurologist.    Please seek emergency help should you begin to feel like harming yourself or anyone else.    Please return to clinic within 3-4 weeks or earlier as needed. We can also be reached at [phone] or you may contact Dr. Vivi Ferns at [phone] or through her assistant, Marjorie Smolder as discussed

## 2016-05-07 ENCOUNTER — Inpatient Hospital Stay: Attending: Internal Medicine

## 2016-05-07 DIAGNOSIS — K219 Gastro-esophageal reflux disease without esophagitis: Secondary | ICD-10-CM

## 2016-05-07 LAB — CBC
Hematocrit: 40.4 % (ref 36.0–48.0)
Hemoglobin: 13.3 g/dL (ref 12.0–16.0)
MCH: 29.6 pg (ref 26.0–34.0)
MCHC: 32.9 g/dL (ref 31.0–36.0)
MCV: 89.7 fL (ref 80.0–100.0)
MPV: 9.9 fL (ref 5.0–10.5)
Platelets: 215 10*3/uL (ref 135–450)
RBC: 4.51 M/uL (ref 4.00–5.20)
RDW: 13.3 % (ref 12.4–15.4)
WBC: 5.8 10*3/uL (ref 4.0–11.0)

## 2016-05-07 LAB — URINALYSIS WITH MICROSCOPIC
Bilirubin Urine: NEGATIVE
Blood, Urine: NEGATIVE
Glucose, Ur: NEGATIVE mg/dL
Ketones, Urine: NEGATIVE mg/dL
Leukocyte Esterase, Urine: NEGATIVE
Nitrite, Urine: NEGATIVE
Protein, UA: NEGATIVE mg/dL
Specific Gravity, UA: 1.01 (ref 1.005–1.030)
Urobilinogen, Urine: 0.2 E.U./dL (ref <2.0)
WBC, UA: NONE SEEN /HPF (ref 0–5)
pH, UA: 8 (ref 5.0–8.0)

## 2016-05-07 LAB — COMPREHENSIVE METABOLIC PANEL
ALT: 17 U/L (ref 10–40)
AST: 19 U/L (ref 15–37)
Albumin/Globulin Ratio: 1.4 (ref 1.1–2.2)
Albumin: 4.3 g/dL (ref 3.4–5.0)
Alkaline Phosphatase: 78 U/L (ref 40–129)
Anion Gap: 16 (ref 3–16)
BUN: 12 mg/dL (ref 7–20)
CO2: 26 mmol/L (ref 21–32)
Calcium: 9.8 mg/dL (ref 8.3–10.6)
Chloride: 100 mmol/L (ref 99–110)
Creatinine: 0.7 mg/dL (ref 0.6–1.1)
GFR African American: >60 (ref >60)
GFR Non-African American: >60 (ref >60)
Globulin: 3 g/dL
Glucose: 86 mg/dL (ref 70–99)
Potassium: 4.4 mmol/L (ref 3.5–5.1)
Sodium: 142 mmol/L (ref 136–145)
Total Bilirubin: 0.4 mg/dL (ref 0.0–1.0)
Total Protein: 7.3 g/dL (ref 6.4–8.2)

## 2016-05-07 LAB — SODIUM, URINE, RANDOM: Sodium, Ur: 52 mmol/L

## 2016-05-07 LAB — LIPID PANEL
Cholesterol, Total: 207 mg/dL — ABNORMAL HIGH (ref 0–199)
HDL: 57 mg/dL (ref 40–60)
LDL Calculated: 120 mg/dL — ABNORMAL HIGH (ref <100)
Triglycerides: 151 mg/dL — ABNORMAL HIGH (ref 0–150)
VLDL Cholesterol Calculated: 30 mg/dL

## 2016-05-07 LAB — CORTISOL TOTAL: Cortisol: 13.2 ug/dL

## 2016-05-07 LAB — PROLACTIN: Prolactin: 145.1 ng/mL

## 2016-05-07 LAB — TSH: TSH: 1.16 u[IU]/mL (ref 0.27–4.20)

## 2016-05-07 LAB — T3: T3, Total: 1.01 ng/mL (ref 0.80–2.00)

## 2016-05-07 LAB — T4, FREE: T4 Free: 1.2 ng/dL (ref 0.9–1.8)

## 2016-05-07 LAB — VITAMIN D 25 HYDROXY: Vit D, 25-Hydroxy: 32.2 ng/mL (ref >=30)

## 2016-05-07 NOTE — Telephone Encounter (Signed)
Dr. Annetta MawKarita Kang with [facility] is asking Dr. Jeanelle MallingSparnall to call her back to discuss patient when she returns on Monday.  [phone]

## 2016-05-10 NOTE — Telephone Encounter (Signed)
LM returning call

## 2016-05-10 NOTE — Telephone Encounter (Signed)
Attempted to contact Dr. Pandora LeiterKang. Message left on her office voice mail

## 2016-05-10 NOTE — Telephone Encounter (Signed)
[facility]avita [facility] wants to talk to Dr Jeanelle MallingSparnall about patient's behavorial changes. She is also drinking too much water and may need some tests. Call Dr Bosie ClosKang's cell # [phone]

## 2016-05-12 LAB — ARGININE VASOPRESSIN HORMONE: Arginine Vasopressin: <0.5 pg/mL (ref 0.0–6.9)

## 2016-05-14 ENCOUNTER — Ambulatory Visit: Admit: 2016-05-14 | Discharge: 2016-05-14 | Payer: BLUE CROSS/BLUE SHIELD | Attending: Nurse Practitioner

## 2016-05-14 DIAGNOSIS — Z0000 Encounter for general adult medical examination without abnormal findings: Secondary | ICD-10-CM

## 2016-05-14 MED ORDER — gabapentin (NEURONTIN) 300 MG capsule
300 | ORAL_CAPSULE | Freq: Two times a day (BID) | ORAL | 0 refills | Status: AC
Start: 2016-05-14 — End: 2016-06-13

## 2016-05-14 NOTE — Progress Notes (Signed)
05/14/16    Danielle Lawrence  60 y.o.  female    Chief Complaint   Patient presents with   ??? Forms         HPI:   Pt presents for annual physical exam. About labor day became fixated with water and [facility].  From there [facility] and [facility].  She is accompanied to day by her husband.  She requires assistance for bathing but can use the toilet alone.  She is able to feed herself pureed food, but needs assistance to remind her to take small bites.       BP 120/74    Temp 98.2 ??F (36.8 ??C) (Oral)    Ht 5' 4.5" (1.638 m)    Wt 140 lb (63.5 kg)    BMI 23.66 kg/m??     Current Outpatient Prescriptions   Medication Sig Dispense Refill   ??? Mirabegron ER 50 MG TB24 Take by mouth     ??? omeprazole (PRILOSEC) 20 MG capsule Take 40 mg by mouth daily     ??? UNABLE TO FIND Calms     ??? risperiDONE (RISPERDAL) 0.5 MG tablet Take 1.5 mg by mouth nightly.     ??? clonazePAM (KLONOPIN) 1 MG tablet Take 1 mg by mouth 4 times daily.       ??? multivitamin (THERAGRAN) per tablet Take 1 tablet by mouth daily.       ??? Calcium Carbonate-Vitamin D (CALCIUM + D) 600-200 MG-UNIT TABS Take 1 tablet by mouth daily.       ??? ibuprofen (ADVIL;MOTRIN) 600 MG tablet Take 600 mg by mouth every 6 hours as needed.       No current facility-administered medications for this visit.        Social History     Social History   ??? Marital status: Married     Spouse name: N/A   ??? Number of children: N/A   ??? Years of education: N/A     Occupational History   ??? Not on file.     Social History Main Topics   ??? Smoking status: Never Smoker   ??? Smokeless tobacco: Never Used   ??? Alcohol use No   ??? Drug use: No   ??? Sexual activity: Yes     Partners: Male     Other Topics Concern   ??? Not on file     Social History Narrative   ??? No narrative on file       Family History   Problem Relation Age of Onset   ??? Alzheimer's Disease Father    ??? Depression Sister    ??? Other Mother      Huntingtons disease        Past Medical History:   Diagnosis Date   ??? Anxiety    ??? Asthma    ??? GERD (gastroesophageal reflux disease)    ??? Huntington's chorea (HCC)    ??? Osteopenia    ??? Seasonal allergies        Review of Systems   Constitutional: Negative for chills and fever.   HENT: Negative.    Respiratory: Negative for cough and shortness of breath.    Cardiovascular: Negative for chest pain and palpitations.   Gastrointestinal: Negative for abdominal pain, blood in stool and constipation.        Consumes pureed food   Genitourinary: Negative for difficulty urinating.  Musculoskeletal: Negative.         Altered gait.  [facility]   Skin: Negative.  Negative for color change and rash.   Neurological: Negative for weakness.   Psychiatric/Behavioral: Negative for agitation and confusion.       Physical Exam   Constitutional: She is oriented to person, place, and time. She appears well-developed and well-nourished. No distress.   HENT:   Head: Normocephalic and atraumatic.   Right Ear: External ear normal.   Left Ear: External ear normal.   Mouth/Throat: Oropharynx is clear and moist. No oropharyngeal exudate.   Eyes: Conjunctivae are normal. Right eye exhibits no discharge. Left eye exhibits no discharge. No scleral icterus.   Neck: Normal range of motion.   Cardiovascular: Normal rate, regular rhythm, normal heart sounds and intact distal pulses.    No murmur heard.  Pulmonary/Chest: Effort normal and breath sounds normal. She has no wheezes.   Abdominal: Soft. Bowel sounds are normal. She exhibits no distension and no mass. There is no tenderness.   Musculoskeletal: Normal range of motion. She exhibits no edema or tenderness.   Altered gait   Lymphadenopathy:     She has no cervical adenopathy.   Neurological: She is alert and oriented to person, place, and time. Coordination abnormal.   Positive for tremors, speech difficulty and confusion   Skin: Skin is warm and dry. No rash noted. She is not diaphoretic. No erythema.    Psychiatric: She has a normal mood and affect. Her behavior is normal.       Encounter Diagnoses   Name Primary?   ??? Well adult exam Yes   ??? Huntington chorea (HCC)    ??? Mild intermittent asthma, unspecified whether complicated    ??? Osteopenia, unspecified location    ??? Seasonal allergic rhinitis, unspecified chronicity, unspecified trigger    ??? Gastroesophageal reflux disease, esophagitis presence not specified    ??? Anxiety        Labs were reviewed  Discussed guidelines for mammograms and pap smears (pt doesn't want )  Flu shot was given at care center        [facility] Mae Keelie Zemanek, CNP

## 2016-05-18 ENCOUNTER — Ambulatory Visit: Payer: Managed Care, Other (non HMO) | Admitting: Family Medicine

## 2016-05-24 ENCOUNTER — Encounter: Payer: PRIVATE HEALTH INSURANCE | Attending: Neurology

## 2016-05-25 ENCOUNTER — Ambulatory Visit: Admit: 2016-05-25 | Payer: PRIVATE HEALTH INSURANCE | Attending: Neurology

## 2016-05-25 ENCOUNTER — Encounter: Payer: Managed Care, Other (non HMO) | Admitting: Family Medicine

## 2016-05-25 DIAGNOSIS — G10 Huntington's disease: Secondary | ICD-10-CM

## 2016-05-25 NOTE — Progress Notes (Signed)
Subjective:      Patient ID: Brenda [name] is a 60 y.o. female.    HPI   Movement Disorders HPI  Chief Complaint: HD  Patient accompanied by and additional history obtained from: husband and son       The patient presents for f/u of HD. She is now at [facility]. She is sleeping ok at night.  She is on a pureed diet.  She is seeing Dr. Vivi Ferns now in psychiatry and she is transitioning onto gabapentin and decreasing clonazepam. Her husband says she is no longer excessively drinking water. She says she thinks they are drugging her at the nursing home.  She says she doesn't want to stay there. Her husband says the nursing facility is very nice and everyone takes good care of her there. She had a fall when she tried to get out of bed the other day and hurt her lip. She is ambulating with a gait belt and the assistance of another person. Her weight is stable. 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     PD Quality Metrics  Falls (Review Each Visit): present     Depression (Review Annually): present     Anxiety (Review Annually): present     Cognitive Impairment (Review Annually): present     Insomnia/Sleep Disturbance (Review Annually): denies              Histories:     She has a past medical history of Anxiety; Asthma; Chronic sinusitis (05/24/2011); Environmental allergies; Esophageal reflux (08/10/2010); Fatigue (05/24/2011); Fracture of left orbit (HCC) (2010); Hematoma, left eyebrow (04/18/2009); Huntington's chorea (HCC) (08/11/2010); Osteopenia (08/10/2010); and Superficial injury of face without infection (04/12/2009).    She has a past surgical history that includes Sinus surgery and Tonsillectomy.    Her family history includes Alzheimer's disease in her father; Anxiety disorder in her sister; Depression in her sister; Huntington's disease in her maternal uncle; Huntington's disease (age of onset: 64) in her mother; Pulmonary embolism in her mother; Suicidality in her sister.    She reports that she has never smoked. She has  never used smokeless tobacco. She reports that she drinks about 0.6 oz of alcohol per week . She reports that she does not use drugs.      Review of Systems: As in HPI    Allergies:   Zithromax [azithromycin]; Bactrim [sulfamethoxazole-trimethoprim]; Erythromycin; Erythromycin base; Methylphenidate; and Sulfa (sulfonamide antibiotics)    Medications:     Outpatient Encounter Prescriptions as of 05/25/2016   Medication Sig Dispense Refill    cholecalciferol, vitamin D3, 1000 units tablet Take 1,000 Units by mouth daily.      clonazePAM (KLONOPIN) 1 MG tablet Take 2 mg by mouth at bedtime.      gabapentin (NEURONTIN) 300 MG capsule Take 1 capsule (300 mg total) by mouth 2 times a day. Indications: Take one pill in am to replace klonopin in am for one week, then replace afternoon klonopin (0.5mg ) with another 300mg  gabapentin (Patient taking differently: Take 300 mg by mouth 2 times a day.    ) 60 capsule 0    mirabegron (MYBETRIQ) 50 mg Tb24 Take 50 mg by mouth daily. Indications: Bladder Hyperactivity      multivitamin (THERAGRAN) tablet Take 1 tablet by mouth daily.        omeprazole 20 mg TbEC Take 40 mg by mouth daily.      risperiDONE (RISPERDAL) 1 MG tablet Take 1 mg in the morning 3 mg at bedtime.  traZODone (DESYREL) 100 MG tablet Take 1 tablet (100 mg total) by mouth at bedtime. 5 tablet 0     No facility-administered encounter medications on file as of 05/25/2016.         Objective:       Blood pressure 123/66, pulse 93, height 5' 4 (1.626 m), weight 137 lb (62.1 kg), last menstrual period 05/20/2011.    Neurologic Exam    Physical Exam   UHDRS 1: Motor Examination  Huntington's Disease Rating Scale  Ocular pursuit, horizontal: 3  Ocular pursuit, vertical: 3  Saccade initiation, horizontal: 3  Saccade initiation, vertical: 3  Saccade velocity, hortizontal : 3  Saccade velocity, vertical: 3  Dysarthria : 2  Tongue Protrusion: 2  Fingers taps, right: 2  Fingers taps, left : 3  Pronate/supinate  hands, right : 2  Pronate/supinate hands, left : 3  Luria (fist-hand-palm test): 3  Rigidity arms, right : 1  Rigidity arms, left: 2  Body Bradykinesia: 2  Maximal dystonia, trunk: 0  Maximal dystonia, RUE: 0  Maximal dystonia, LUE: 1  Maximal dystonia, RLE: 1  Maximal dystonia, LLE: 1  Maximal chorea, face: 1  Maixmal chorea, BOL: 1  Maximal chorea, trunk: 0   Maximal chorea, RUE: 0  Maximal chorea, LUE: 0  Maximal chorea, RLE: 1  Maximal chorea, LLE: 1  Gait: 3  Tandem Walking: 4  Retropulsion Pull Test: 2  Diagnosis confidence level: 4  UHDRS 1 TOTAL:: 56            Assessment:     1. Huntington's disease (HCC)      She has family history of HD, and her history and examination are consistent with a clinical diagnosis of Huntington's disease, progressively worse over time. 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 She is seeing Dr. Vivi Ferns in psychiatry and has been undergoing some medication changes recently.  She is now in a nursing home. Her balance is worse over time as well as cognition. 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 Sleep is not an issue per her husband. Weight is stable. Swallowing problems are still an issue and she is on a pureed diet for this.     Plan:     1. Continue gabapentin, clonazepam and risperidone per Dr. Vivi Ferns.   2. Continue fall precautions, pureed diet.   3. Follow-up with me in 4 months.

## 2016-06-01 ENCOUNTER — Ambulatory Visit: Payer: PRIVATE HEALTH INSURANCE | Attending: Psychiatry

## 2016-06-01 ENCOUNTER — Encounter: Payer: Managed Care, Other (non HMO) | Admitting: Family Medicine

## 2016-06-13 MED ORDER — risperiDONE (RISPERDAL) 1 MG tablet
1 | ORAL_TABLET | Freq: Two times a day (BID) | ORAL | 0 refills | Status: AC
Start: 2016-06-13 — End: 2016-07-28

## 2016-06-13 MED ORDER — clonazePAM (KLONOPIN) 1 MG tablet
1 | ORAL_TABLET | Freq: Every evening | ORAL | 0 refills | Status: AC
Start: 2016-06-13 — End: 2016-07-28

## 2016-06-13 MED ORDER — gabapentin (NEURONTIN) 300 MG capsule
300 | ORAL_CAPSULE | Freq: Three times a day (TID) | ORAL | 0 refills | Status: AC
Start: 2016-06-13 — End: 2017-08-25

## 2016-06-23 ENCOUNTER — Encounter: Payer: Self-pay | Admitting: Family Medicine

## 2016-07-26 ENCOUNTER — Inpatient Hospital Stay
Admit: 2016-07-26 | Discharge: 2016-07-26 | Disposition: A | Discharge status: Other Acute Inpatient Hospital | Payer: PRIVATE HEALTH INSURANCE

## 2016-07-26 ENCOUNTER — Inpatient Hospital Stay
Admit: 2016-07-26 | Discharge: 2016-07-26 | Disposition: A | Discharge status: Home / Self Care | Payer: PRIVATE HEALTH INSURANCE

## 2016-07-26 DIAGNOSIS — F411 Generalized anxiety disorder: Secondary | ICD-10-CM

## 2016-07-26 DIAGNOSIS — F22 Delusional disorders: Secondary | ICD-10-CM

## 2016-07-26 LAB — BASIC METABOLIC PANEL
Anion Gap: 10 mmol/L (ref 3–16)
BUN: 21 mg/dL (ref 7–25)
CO2: 23 mmol/L (ref 21–33)
Calcium: 8.8 mg/dL (ref 8.6–10.3)
Chloride: 103 mmol/L (ref 98–110)
Creatinine: 0.72 mg/dL (ref 0.60–1.30)
Glucose: 125 mg/dL (ref 70–100)
Osmolality, Calculated: 286 mOsm/kg (ref 278–305)
Potassium: 3.4 mmol/L (ref 3.5–5.3)
Sodium: 136 mmol/L (ref 133–146)
eGFR AA CKD-EPI: >90 See note.
eGFR NONAA CKD-EPI: >90 See note.

## 2016-07-26 MED ORDER — ziprasidone (GEODON) injection 10 mg
20 | Freq: Once | INTRAMUSCULAR | Status: AC
Start: 2016-07-26 — End: 2016-07-26

## 2016-07-26 MED ORDER — LORazepam (ATIVAN) 2 mg/mL injection
2 | INTRAMUSCULAR | Status: AC
Start: 2016-07-26 — End: 2016-07-26
  Administered 2016-07-26: 09:00:00 2 via INTRAVENOUS

## 2016-07-26 MED ORDER — ziprasidone (GEODON) 20 mg/mL (final conc.) injection
20 | INTRAMUSCULAR | Status: AC
Start: 2016-07-26 — End: 2016-07-26
  Administered 2016-07-26: 06:00:00 10 via INTRAMUSCULAR

## 2016-07-26 MED ORDER — LORazepamATIVAN2mgmLinjection
2 | Freq: Once | INTRAMUSCULAR | Status: AC
Start: 2016-07-26 — End: 2016-07-26

## 2016-07-26 MED ORDER — ziprasidone (GEODON) 20 mg/mL (final conc.) injection
20 | INTRAMUSCULAR | Status: AC
Start: 2016-07-26 — End: 2016-07-26
  Administered 2016-07-26: 09:00:00 10 via INTRAMUSCULAR

## 2016-07-26 MED FILL — GEODON 20 MG/ML (FINAL CONCENTRATION) INTRAMUSCULAR SOLUTION: 20 20 mg/mL (final conc.) | INTRAMUSCULAR | Qty: 20

## 2016-07-26 MED FILL — LORAZEPAM 2 MG/ML INJECTION SOLUTION: 2 2 mg/mL | INTRAMUSCULAR | Qty: 1

## 2016-07-26 NOTE — Other (Unsigned)
[facility]     [facility] Psychiatric Emergency  Service Evaluation    Reason for Visit/Chief Complaint: Agitation      Patient History    HPI  61yo woman with hx of Huntington's disease who was transferred from [facility] after  requesting psychiatric evaluation from her husband. She was noted to be   agitated  upon arrival and received dose of geodon and ativan. She was remained calm  throughout the night, and this morning was noted to be pleasant, requesting  psychiatric evaluation. She reports that she became distraught at nursing home  because she was having difficulty swallowing. This is noted in her hx, and she  has an altered diet for this. She states that she requested medical work-up   for  this. She is currently some depressed mood in thinking about her current  illness, although denies suicidal ideation, confusion, difficulty thinking,  racing thoughts, and sleep disturbance. She is declining psychiatric admission  as well as medicaitons at this time, requesting to go home for breakfast.    Spoke with husband over phone, who reports that she has been having increasing  anxiety in the SNF about her swallowing and illness. He states that she is   safe  in SNF, but becomes difficult to console in SNF. Attempted to complete   referral  to Dr. Lovenia Kim, who started her on risperdal and gabapentin and has follow-up  in mood disorder clinic.    Reviewed records, and appears to have similar presentation at her last  psychiatry appointment in October 2017.    PES Triage Screening:  Broset score:  PSS- Safety Screen Score: 1  Suicide Screen: Is patient expressing suicidal ideations?: No    Is Admission due to self harm?: No    Has Patient Attempted Suicide or Self Harm in past 72 hours?: No    Is Patient experiencing acute agitation, anxiety or insomnia?  : Unable to  assess    Context: stress  Location: Altered mental status of anxiety  Duration: chronic  Severity: mild to moderate.  Associated Symptoms: mild to  moderate.  Modifying Factors: other: illness progression.  Timing: intermittent.    Past Psychiatric History:  Evaluated in PES in October 2017. Presentation at that time also required  emergency medications. At discharge was noted to be at baseline, and   instructed  to continue with outpatient services. Previous hospitalizations a few years  prior for anxiety.    Hospitalizations: yes - please see above.    Past suicide attempts: no.    History of violence: no.    Substance Use History: None.    PMH:      Past Medical History:  Diagnosis Date   Anxiety   Asthma   Chronic sinusitis 05/24/2011   Environmental allergies   Esophageal reflux 08/10/2010   Fatigue 05/24/2011   Fracture of left orbit ([place]) 2010   Hematoma, left eyebrow 04/18/2009   Huntington's chorea ([place]) 08/11/2010   Osteopenia 08/10/2010   Superficial injury of face without infection 04/12/2009    I have reviewed the past medical history.  Additional history obtained: no    Social History:    Work History:  disability    Social History    Social History   Marital status: Married    Spouse name: N/A   Number of children: N/A   Years of education: N/A    Occupational History   Oceanographer    Social History Main Topics   Smoking  status: Never Smoker   Smokeless tobacco: Never Used   Alcohol use 0.6 oz/week    1 Cans of beer per week     Comment: Occasionally (per [facility] into)   Drug use: No     Comment: per [facility] into   Sexual activity: Yes    Partners: Male    Other Topics Concern   Caffeine Use Yes   Occupational Exposure No   Exercise Yes   Seat Belt Yes    Social History Narrative   None    I have reviewed the past social history.  Additional history obtained: no.    Family History:    Family History  Problem Relation Age of Onset   Pulmonary embolism Mother   Huntington's disease Mother 87   Alzheimer's disease Father   Depression Sister   Suicidality Sister    attemped suicide 2005   Anxiety disorder Sister   Huntington's disease Maternal Uncle    I  have reviewed the past family history.  Additional history obtained: no.    Medications:  Previous Medications   CLONAZEPAM (KLONOPIN) 1 MG TABLET    Take 2 tablets (2 mg total) by mouth at  bedtime.   GABAPENTIN (NEURONTIN) 300 MG CAPSULE    Take 2 capsules (600 mg total) by  mouth 3 times a day. Midday dose is as needed for agitation   RISPERIDONE (RISPERDAL) 1 MG TABLET    Take 1 tablet (1 mg total) by mouth 2  times a day. Take 1 mg in the morning 3 mg at bedtime.   TRAZODONE (DESYREL) 100 MG TABLET    Take 1 tablet (100 mg total) by mouth at  bedtime.      Allergies:  Allergies as of 07/26/2016 - Fully Reviewed 07/26/2016  Allergen Reaction Noted   Zithromax [azithromycin] Anaphylaxis 06/08/2011   Bactrim [sulfamethoxazole-trimethoprim] Nausea Only and Hives 06/08/2011   Erythromycin Diarrhea 07/30/2011   Erythromycin base Diarrhea and Nausea And Vomiting   Methylphenidate Hives 05/08/2013   Sulfa (sulfonamide antibiotics) Hives 03/26/2011      Review of Systems    Review of Systems      Physical Exam/Objective Data    ED Triage Vitals  Vital Signs Group     Temp 07/26/16 0435 98.9 deg F (37.2 deg C)     Temp Source 07/26/16 0435 Oral     Heart Rate 07/26/16 0418 76     Heart Rate Source 07/26/16 0418 Automatic     Resp 07/26/16 0418 17     SpO2 07/26/16 0418 98 %     BP 07/26/16 0418 153/70     BP Location 07/26/16 0435 Left arm     BP Method 07/26/16 0418 Automatic     Patient Position 07/26/16 0418 Lying  SpO2 07/26/16 0418 98 %  O2 Device 07/26/16 0418 None (Room air)      Physical Exam    Mental Status Exam:    Gait and Muscle Strength:  Normal and Muscle strength intact  Appearance and Behavior: Calm, Cooperative and Open Historian      Groomed and NL Body Habitus  Speech: NL articulation, prosody, volume and production  Language: Naming intact  Mood: euthymic  Affect: appropriate and full range  Thought Process and Associations: goal directed and no derailment       No loose associations  Thought  Content: no suicidal/homicidal with plan and contracting for safety  Abnormal or psychotic thoughts: None  Orientation: person, place, time/date and situation  Memory: recent, remote, and immediate recall intact  Attention and Concentration: intact  Abstraction: intact  Fund of Knowledge: average  Insight and Judgement: Partial     Fair        Labs:    Please see electronic medical record for any tests performed in the ED.  Recent Results (from the past 24 hour(s))  Basic metabolic panel   Collection Time: 07/26/16  1:31 AM  Darly Fails Value Ref Range   Sodium 136 133 - 146 mmol/L   Potassium 3.4 (L) 3.5 - 5.3 mmol/L   Chloride 103 98 - 110 mmol/L   CO2 23 21 - 33 mmol/L   Anion Gap 10 3 - 16 mmol/L   BUN 21 7 - 25 mg/dL   Creatinine 0.72 0.60 - 1.30 mg/dL   Glucose 125 (H) 70 - 100 mg/dL   Calcium 8.8 8.6 - 10.3 mg/dL   Osmolality, Calculated 286 278 - 305 mOsm/kg   eGFR AA CKD-EPI >90 See note.   eGFR NONAA CKD-EPI >90 See note.      Radiology and EKG:  No results found.    EKG: Please see electronic medical record for any studies performed in the ED.    Emergency Course and Plan    Danielle Lawrence is a 61 y.o. female who presented to the emergency department with  Agitation    Anxiety - current presentation actually appears to be at her baseline. This  morning she is cooperative, eating and drinking, and requesting to return to   her  SNF for breakfast. She does state that she is having difficulty swallowing,   and  was requesting further evaluation for this. Discussed at length her anxiety,   as  well as her symptoms of Huntington's, and that she may benefit from SSRI. She   is  declining treatment at this time, and reports that she is worried about side  effects of additional medications. Discussed case with her husband, who is  worried about her worsening anxiety, and sometimes seems "uncontrollable" at  thenursing home. Did reiterate that although she did appear anxious and   agitated  when she first arrived to [facility],  she is now calm, and denying psychiatric  complaints. Discussed that she is declining additional medicaitons at this, is  refusing psychiatric admission, and appears to have capacity to make these  decisions at this time. Did discuss the consideration of consulting psychiatry  at her SNF, as husband admits that transportation is an issue for them. Will  route note to her primary psychiatrist at [facility] as well as Dr. Lovenia Kim. Other  potential possibilities include evlauation by geriatric psychiatry for further  assistance.      Diagnosis:    Primary psychiatric Diagnosis: GAD  Other psychiatric Diagnoses: None  Substance Use Diagnoses: None  Medical Diagnoses: Huntington's disease    Global assessment of functioning: 41-50 serious symptoms    Disposition:    Discharged from the ED. See AVS for prescriptions, followup, and discharge  instructions.   No emergency medical condition present at discharge., Patient  not deemed to be an imminent threat of harm to self or others. and Patient has   a  good safety plan and discharge disposition in place.  Summary of rationale for disposition: she is denying suicidal ideation, is   calm  and cooperative, has safe place for discharge, and is contracting for safety.    Provider completing note: Attending.    Patient was in OTA.  Patient had a completed Statement of Belief during this encounter: yes  ,  released by attending physician.    Medications given in PES: yes.  Medications prescribed for home or inpatient use: no.  Laboratory work ordered: yes.  Other diagnostic studies ordered: no.  Old and/or outside medical records reviewed: yes.  Collateral information contacted: yes.  Patient's outside provider contacted: yes.    Marilynne Drivers, MD  [facility]          Minda Ditto, MD  07/26/16 1624      Minda Ditto, MD  07/26/16 [phone]

## 2016-07-26 NOTE — Other (Unsigned)
[facility]                    [facility] ED Note    Date of Service: 07/26/2016    Reason for Visit: Altered Mental Status      Patient History    HPI:  Danielle Lawrence is a 61 y.o. female is a 61 year old female with history of  chronic disease, anxiety, asthma, reflux and presents to emergency department  via EMS stating that she cannot swallow.  Patient repeatedly states that she  cannot swallow" needs a catheter ".  The patient repeatedly states "take me to  really emergency department ".  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  The husband states that he had dinner with her this  evening and the patient tolerated this without difficulty.  The husband states  that she drank a cup of water a couple hours prior to arrival.  The patient  states that she refused to take her evening medication to clean Risperdal and  that he has a strong suspicion this is psychotic in nature.  Husband states   that  the patient perseverates at her baseline.  The patient's husband denies any  recent illnesses, fever, chills, cough, chest pain, weakness, sensation   changes.  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    With the exception of above, the patient denies any aggravating or alleviating  factors as well as any other associated signs or symptoms.    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 She has never used smokeless  tobacco. She reports that she  drinks about 0.6 oz of alcohol per week . She  reports that she does not use drugs.    Previous Medications   CHOLECALCIFEROL, VITAMIN D3, 1000 UNITS TABLET    Take 1,000 Units by mouth  daily.   CLONAZEPAM (KLONOPIN) 1 MG TABLET    Take 2 tablets (2 mg total) by mouth at  bedtime.   GABAPENTIN (NEURONTIN) 300 MG CAPSULE    Take 2 capsules (600 mg total) by  mouth 3 times a day. Midday dose is as needed for agitation   MIRABEGRON (MYBETRIQ) 50 MG TB24    Take 50 mg by mouth daily. Indications:  Bladder Hyperactivity   MULTIVITAMIN (THERAGRAN) TABLET    Take 1 tablet by mouth daily.   OMEPRAZOLE 20 MG TBEC    Take 40 mg by mouth daily.   RISPERIDONE (RISPERDAL) 1 MG TABLET    Take 1 tablet (1 mg total) by mouth 2  times a day. Take 1 mg in the morning 3 mg at bedtime.   TRAZODONE (DESYREL) 100 MG TABLET    Take 1 tablet (100 mg total) by mouth at  bedtime.      Allergies:  Allergies as of 07/26/2016 - Fully Reviewed 07/26/2016  Allergen Reaction Noted  Zithromax [azithromycin] Anaphylaxis 06/08/2011   Bactrim [sulfamethoxazole-trimethoprim] Nausea Only and Hives 06/08/2011   Erythromycin Diarrhea 07/30/2011   Erythromycin base Diarrhea and Nausea And Vomiting   Methylphenidate Hives 05/08/2013   Sulfa (sulfonamide antibiotics) Hives 03/26/2011      Nursing notes reviewed.    Review of Systems  Review of systems per husband  Review of Systems  Constitutional: Negative for chills and fever.  HENT: Negative for congestion, hearing loss and sore throat.  Eyes: Negative for blurred vision and double vision.  Respiratory: Negative for cough and sputum production.  Cardiovascular: Negative for chest pain and orthopnea.  Gastrointestinal: Negative for abdominal pain, blood in stool, constipation,  diarrhea, nausea and vomiting.  Genitourinary: Negative for dysuria, frequency and urgency.  Musculoskeletal: Negative for back pain, joint pain and neck pain.  Skin: Negative for itching and rash.  Neurological: Negative for  dizziness, sensory change, focal weakness, loss of  consciousness and headaches.  Psychiatric/Behavioral: The patient is nervous/anxious.      Physical Exam    ED Triage Vitals [07/26/16 0054]  Vital Signs Group     Temp 100.1 deg F (37.8 deg C)     Temp Source Oral     Pulse     Heart Rate Source Monitor     Resp 17     SpO2 98 %     BP 133/70     BP Location Left arm     BP Method Automatic     Patient Position Sitting  SpO2 98 %  O2 Device None (Room air)      Physical Exam  Constitutional: She appears well-developed and well-nourished.  HENT:  Head: Normocephalic and atraumatic.  uvula was midline, there was no swelling on the posterior pharyngeal space or  tongue.   The patient was protecting the airway well and controlling   secretions  as well.  There is no fullness or swelling the submental space  Eyes: EOM are normal. Pupils are equal, round, and reactive to light.  Neck: Normal range of motion.  Cardiovascular: Normal rate, regular rhythm and normal heart sounds.  Pulmonary/Chest: Effort normal and breath sounds normal. No respiratory  distress. She has no wheezes. She has no rales.  Abdominal: Soft. She exhibits no distension. There is no tenderness. There is   no  guarding.  Musculoskeletal: Normal range of motion. She exhibits no deformity.  Neurological: She is alert. She displays normal reflexes. No cranial nerve  deficit. Coordination normal.  Skin: Skin is warm and dry. Capillary refill takes less than 2 seconds.  Psychiatric: Her mood appears anxious. She is agitated. Thought content is  paranoid.  Vitals reviewed.        Diagnostic Studies    Labs:    Please see electronic medical record for any tests performed in the ED    Radiology:    Please see electronic medical record for any tests performed in the ED    EKG:    No EKG Performed    Emergency Department Procedures        ED Course and MDM  [name] is a 61 y.o. female who presented to the emergency department with  Altered Mental Status    with a history and presentation as described above in HPI. The patient was  evaluated by myself and the ED Attending Physician, Dr. Urbano Heir. All management  and disposition plans were discussed and agreed upon.    On evaluation the patient was agitated and paranoid,  trying to actively get   out  of her stretcher upon arrival via EMS.  10 mg of Geodon was given IM, the  patient responded appropriately.  The patient stated that she felt like she  could not swallow.  The patient's airway was intact, she had no difficulty  breathing and was hemodynamically stable. On exam the patient's uvula was  midline, there was no swelling on the posterior pharyngeal space or tongue.  There was no evidence of deep space neck infection such as RPA or PTA on  physical exam.  The patient was protecting the airway well and controlling  secretions as well.  There is no fullness or swelling the submental space, the  patient's voice is unchanged for husband.  I have little concerned for  obstruction of the oropharynx, esophagus or trachea. This was consistent with  the patient's previous presentation.  The patient was tachycardic on arrival,  however her vitals normalized once her agitation was decreased with Geodon.    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  Otherwise electrolytes were within normal  limits.  [name] a screening exam for this patient showed no acute   emergencies.  The patient's neurologic exam was intact with no focal weakness, sensory  changes, cranial nerves are intact.  At this point I feel it is safe for the  patient be transferred to the psychiatric emergency services for further  psychiatric evaluation.    I discussed this patient with the psychiatrist at the psychiatric emergency  services at [facility] who stated be appropriate to send psychiatric hold and  transfer the patient to the psychiatric emergency services.  The  psychiatric  physician accepted the patient for transfer to psychiatric emergency services   at  [facility].    This was explained to the patient's husband who agree with the risk and  benefits.    Impression    1. Paranoia (HCC)    Plan  1. The patient is to be discharged home in stable/improved condition.  2. Workup, treatment and diagnosis were discussed with the patient and/or   family  members; the patient agrees to the plan and all questions were addressed and  answered.  3. The patient is instructed to return to the emergency department should her  symptoms worsen or any concern she believes warrants acute physician   evaluation.      ADAM Sherrie Sport, MD, PGY1  [facility]            Hulda Humphrey, MD  Resident  07/26/16 0700

## 2016-07-26 NOTE — Unmapped (Signed)
Assisted to bathroom.  Pt had strict limits not to get out of bed by herself.  Again, pt agreed.  Asking to go back to the care center.

## 2016-07-26 NOTE — Unmapped (Signed)
[facility] ED Note    Date of Service: 07/26/2016    Reason for Visit: Altered Mental Status      Patient History     HPI:  Brenda [name] is a 61 y.o. female is a 61 year old female with history of chronic disease, anxiety, asthma, reflux and presents to emergency department via EMS stating that she cannot swallow.  Patient repeatedly states that she cannot swallow needs a catheter .  The patient repeatedly states take me to really emergency department .  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  The husband states that he had dinner with her this evening and the patient tolerated this without difficulty.  The husband states that she drank a cup of water a couple hours prior to arrival.  The patient states that she refused to take her evening medication to clean Risperdal and that he has a strong suspicion this is psychotic in nature.  Husband states that the patient perseverates at her baseline.  The patient's husband denies any recent illnesses, fever, chills, cough, chest pain, weakness, sensation changes.  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     With the exception of above, the patient denies any aggravating or alleviating factors as well as any other associated signs or symptoms.    Past Medical History:   Diagnosis Date   ??? Anxiety    ??? Asthma    ??? Chronic sinusitis 05/24/2011   ??? Environmental allergies    ??? Esophageal reflux 08/10/2010   ??? Fatigue 05/24/2011   ??? Fracture of left orbit (HCC) 2010   ??? Hematoma, left eyebrow 04/18/2009   ??? 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 Osteopenia 08/10/2010   ??? Superficial injury of face without infection 04/12/2009       Past Surgical History:   Procedure Laterality Date   ??? SINUS SURGERY     ??? TONSILLECTOMY         Brenda [name]  reports that she has never smoked. She has never used smokeless tobacco. She reports that she  drinks about 0.6 oz of alcohol per week . She reports that she does not use drugs.    Previous Medications    CHOLECALCIFEROL, VITAMIN D3, 1000 UNITS TABLET    Take 1,000 Units by mouth daily.    CLONAZEPAM (KLONOPIN) 1 MG TABLET    Take 2 tablets (2 mg total) by mouth at bedtime.    GABAPENTIN (NEURONTIN) 300 MG CAPSULE    Take 2 capsules (600 mg total) by mouth 3 times a day. Midday dose is as needed for agitation    MIRABEGRON (MYBETRIQ) 50 MG TB24    Take 50 mg by mouth daily. Indications: Bladder Hyperactivity    MULTIVITAMIN (THERAGRAN) TABLET    Take 1 tablet by mouth daily.      OMEPRAZOLE 20 MG TBEC    Take 40 mg by mouth daily.    RISPERIDONE (RISPERDAL) 1 MG TABLET    Take 1 tablet (1 mg total) by mouth 2 times a day. Take 1 mg in the morning 3 mg at bedtime.    TRAZODONE (DESYREL) 100 MG TABLET    Take 1 tablet (100 mg total) by mouth at bedtime.       Allergies:   Allergies as of 07/26/2016 - Fully Reviewed 07/26/2016   Allergen Reaction Noted   ??? Zithromax [azithromycin] Anaphylaxis 06/08/2011   ???  Bactrim [sulfamethoxazole-trimethoprim] Nausea Only and Hives 06/08/2011   ??? Erythromycin Diarrhea 07/30/2011   ??? Erythromycin base Diarrhea and Nausea And Vomiting    ??? Methylphenidate Hives 05/08/2013   ??? Sulfa (sulfonamide antibiotics) Hives 03/26/2011       Nursing notes reviewed.    Review of Systems   Review of systems per husband  Review of Systems   Constitutional: Negative for chills and fever.   HENT: Negative for congestion, hearing loss and sore throat.    Eyes: Negative for blurred vision and double vision.   Respiratory: Negative for cough and sputum production.    Cardiovascular: Negative for chest pain and orthopnea.   Gastrointestinal: Negative for abdominal pain, blood in stool, constipation, diarrhea, nausea and vomiting.   Genitourinary: Negative for dysuria, frequency and urgency.   Musculoskeletal: Negative for back pain, joint pain and neck pain.   Skin: Negative for itching and rash.    Neurological: Negative for dizziness, sensory change, focal weakness, loss of consciousness and headaches.   Psychiatric/Behavioral: The patient is nervous/anxious.        Physical Exam     ED Triage Vitals [07/26/16 0054]   Vital Signs Group      Temp 100.1 ??F (37.8 ??C)      Temp Source Oral      Pulse       Heart Rate Source Monitor      Resp 17      SpO2 98 %      BP 133/70      BP Location Left arm      BP Method Automatic      Patient Position Sitting   SpO2 98 %   O2 Device None (Room air)       Physical Exam   Constitutional: She appears well-developed and well-nourished.   HENT:   Head: Normocephalic and atraumatic.   uvula was midline, there was no swelling on the posterior pharyngeal space or tongue.   The patient was protecting the airway well and controlling secretions as well.  There is no fullness or swelling the submental space   Eyes: EOM are normal. Pupils are equal, round, and reactive to light.   Neck: Normal range of motion.   Cardiovascular: Normal rate, regular rhythm and normal heart sounds.    Pulmonary/Chest: Effort normal and breath sounds normal. No respiratory distress. She has no wheezes. She has no rales.   Abdominal: Soft. She exhibits no distension. There is no tenderness. There is no guarding.   Musculoskeletal: Normal range of motion. She exhibits no deformity.   Neurological: She is alert. She displays normal reflexes. No cranial nerve deficit. Coordination normal.   Skin: Skin is warm and dry. Capillary refill takes less than 2 seconds.   Psychiatric: Her mood appears anxious. She is agitated. Thought content is paranoid.   Vitals reviewed.        Diagnostic Studies     Labs:    Please see electronic medical record for any tests performed in the ED     Radiology:    Please see electronic medical record for any tests performed in the ED    EKG:    No EKG Performed    Emergency Department Procedures         ED Course and MDM   Brenda [name] is a 61 y.o. female who presented to the  emergency department with Altered Mental Status   with a history and presentation as described above in HPI.  The patient was evaluated by myself and the ED Attending Physician, Dr. Urbano Heir. All management and disposition plans were discussed and agreed upon.    On evaluation the patient was agitated and paranoid, trying to actively get out of her stretcher upon arrival via EMS.  10 mg of Geodon was given IM, the patient responded appropriately.  The patient stated that she felt like she could not swallow.  The patient's airway was intact, she had no difficulty breathing and was hemodynamically stable. On exam the patient's uvula was midline, there was no swelling on the posterior pharyngeal space or tongue. There was no evidence of deep space neck infection such as RPA or PTA on physical exam.  The patient was protecting the airway well and controlling secretions as well.  There is no fullness or swelling the submental space, the patient's voice is unchanged for husband.  I have little concerned for obstruction of the oropharynx, esophagus or trachea. This was consistent with the patient's previous presentation.  The patient was tachycardic on arrival, however her vitals normalized once her agitation was decreased with Geodon.  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  Otherwise electrolytes were within normal limits.  [name] a screening exam for this patient showed no acute emergencies.  The patient's neurologic exam was intact with no focal weakness, sensory changes, cranial nerves are intact.  At this point I feel it is safe for the patient be transferred to the psychiatric emergency services for further psychiatric evaluation.    I discussed this patient with the psychiatrist at the psychiatric emergency services at [facility] who stated be appropriate to send psychiatric hold and transfer the patient to the psychiatric emergency services.   The psychiatric physician accepted the patient for transfer to psychiatric emergency services at [facility].     This was explained to the patient's husband who agree with the risk and benefits.    Impression     1. Paranoia (HCC)      Plan   1. The patient is to be discharged home in stable/improved condition.  2. Workup, treatment and diagnosis were discussed with the patient and/or family members; the patient agrees to the plan and all questions were addressed and answered.  3. The patient is instructed to return to the emergency department should her symptoms worsen or any concern she believes warrants acute physician evaluation.      Kruz Chiu Sherrie Sport, MD, PGY1  [facility]             Hulda Humphrey, MD  Resident  07/26/16 0700

## 2016-07-26 NOTE — Unmapped (Signed)
Discharge Instructions After an Episode of Suicide Threats or Actions    Remove all firearms, weapons (of any kind) or any unneeded medicines that could be used.  Identify a support person/advocate and attend follow-up mental health appointments with this person.  Be direct and talk openly about suicidal thoughts.  Allow expression of feelings.  Block all inappropriate internet websites and social media.  Get help from agencies that specialize in crisis intervention.  Create a personalized safety plan.   The following list are suicide prevention resources available 24 hours a day.  [place]:    Crisis Hotline:     281-CARE (2273)     Psychiatric Emergency Services:  513-584-8577     Mobile Crisis Team:    513-584-5098    [place]:   [place] Consultation and Crisis [phone]    [place]:    Emergency Crisis Hotline:   513-528-SAVE (7283)    [place]:   [place] Emergency Crisis Line:  859-331-3292    National:    National Hotline:    1-800-273-TALK(8255)     www.suicidepreventionlifeline.org

## 2016-07-26 NOTE — Unmapped (Signed)
45 White female presents via squad from [employer] after being medically cleared.  Pt reports she was at the [facility] and wants to go back.  Pt keeps saying that she cannot swallow and no one believes her about this.  Upon arrival pt was out of control, trying to get out the door, trying to fight against staff when redirected and again trying to get out of the door.  Pt got very agitated when she found out she was coming to [facility].  Keeps saying she wants to go to the real hospital.  Pt has said nothing about hurting self or others.  Has said nothing about having hallucinations except that she cannot swallow.  Pt admitted to OTA, belongings secured.    Given blanket, bed adjusted for comfort.  Offered food and drink.  Safety checks started.  Will continue to monitor pt.    Unable to verify information as pt is a poor historian and gives unreliable information.    Placed into four point restraints for safety of self and others.  Pt was quite combative during this time.

## 2016-07-26 NOTE — Unmapped (Signed)
Pt assisted to bathroom.  Returned to bed, given pants per pt request.  Pt keeps asking when she can go home.

## 2016-07-26 NOTE — Unmapped (Signed)
Pt has been seen by the Dr and SW and it has been determined that she is safe and stable to return to her NH.  Pt NH has been notified that pt will be returning.  Pt has been given discharge instructions with follow up with Dr Gerilyn Pilgrim.  Pt was given all belongings he arrived with as well. Transportation is set up for pick up at 11am

## 2016-07-26 NOTE — ED Notes (Signed)
Pt instructed not to get out of bed until she calls staff for help first.  Pt agreed.

## 2016-07-26 NOTE — Unmapped (Signed)
Patient calmer and able to contract for safety.  Patient was taken out of restraints and was assisted to the bathroom with constant staff observation and water turned off in bathroom due to polydipsia. Patient was assisted to put on new attends pull up.  Patient was cooperative with no unsafe behaviors noted. Patient reported that she feels like she can swallow better now.  Patient verbally reassured and safety checks continue.

## 2016-07-26 NOTE — Unmapped (Signed)
SOCIAL WORK NOTE:    Pt is a 61 y/o caucasian female transferred to [facility] from [facility] on [facility].  Pt presented there from her NH, [facility].  There is a note from PSW A.Pappalardo; please see that for additional SW information.  Upon arrival to [facility], pt became agitated and uncooperative, and required restraints and IM medication to maintain her safety.      SW will continue to follow, and attempt to meet any additional SW needs.

## 2016-07-26 NOTE — ED Triage Notes (Signed)
Pt to [facility] with SNF c/o AMS and inability to swallow. Pt with hx of huntingtons. HLIV in place

## 2016-07-26 NOTE — Unmapped (Signed)
ED Attending Attestation Note    Date of service:  07/26/2016    This patient was seen by the resident physician.  I have seen and examined the patient, agree with the workup, evaluation, management and diagnosis. The care plan has been discussed and I concur.     My assessment reveals a 61 y.o. female with a history of Huntington's disease, who will occasionally have psychotic outbursts according to the family, who comes in after another outburst.  The family discussed the case with psychiatry who recommended she come to the emergency department.  She keeps reporting that she is choking and that she cannot swallow, but the family reports that she has not been choking and has been eating normally all day.  The family reports this is identical to her psychiatric outburst previously.  They deny any drug ingestions or new medications.    Jules Husbands, MD

## 2016-07-26 NOTE — Unmapped (Signed)
Patient in the psych ED today, now back at nursing home. Spoke with her husband.  Please call nursing home and ask to have list of all of her medications faxed to [place] so I can review and make any changes.  Will you also see if the psychiatrist has seen her yet in the facility?

## 2016-07-26 NOTE — Unmapped (Signed)
SW Note: SW and Attending met with pt who appears baseline with no over behaviors. She's appears calm with no apparent psychosis and wants to return to the SNF. The pt is fairly perseverative about her swallowing issues and is obviously concerned about the progression of her Huntington's. This does make her feel depressed. The pt denies SI and contracts for safety. At this time appears appropriate for discharge.     SW spoke briefly with pt's husband, Brenda [name] [phone]) and provided update. SW transferred call to Attending.    SW provided resources for suicide prevention and follow up with Dr. Christella Hartigan.    There are further SW needs at this time.

## 2016-07-26 NOTE — Unmapped (Signed)
Pt is requesting to see Dr Christella Hartigan so that he can check her swallowing. Pt states that she would like to return home for breakfast as well.  Pt was told that she is just going to see the psychiatrist no other medical Dr and that she had already been evaluated by the medical Dr in the main ER.

## 2016-07-26 NOTE — Unmapped (Signed)
SOCIAL WORK NOTE:    SW spoke with Dois Davenport at [facility].  She says pt became a resident there in November.  Her anxious behavior is normal, where she gets worked up, runs around, can't be easily directed back to bed/room.  She is difficult to calm down.  Last night around 10pm she became very worried about difficulty swallowing/choking.  Her mother also had Huntington's so she is well aware of the disease process.  She refused her nighttime meds and would not drink any water.  Pt has an order for nectar thick or honey thick liquids there, is not permitted thin liquids.  She is normally med compliant.  Dois Davenport says they sent pt to [facility] for medical eval d/t concerns for difficulty swallowing.  Pt's husband was pushing for a psych eval.      The nursing home can be reached at [phone].  Thayer Ohm will be pt's nurse this morning.

## 2016-07-26 NOTE — Unmapped (Signed)
Pt resting quietly on right side, eyes closed and respirations easy.

## 2016-07-26 NOTE — Unmapped (Signed)
Assisted to bathroom again.  Pt keeps saying she has to make her bowels move however does no accomplish this.  Again directed pt not to get up until staff comes to help her.

## 2016-07-26 NOTE — Unmapped (Addendum)
Psych Social Work Brief Note      Presentation:  Pt is a 61 year old caucasian female  With a history of Huntingtons disease, anxiety , asthma  who presented to  [facility]  Via squad after reporting she was choking and can not swallow . The family discussed the case  With pts psychiatrist, Dr. Vivi Ferns ,   who recommended that she come to the emergency department . Pt  Was transported by squad from [facility] center       Current Mental Status:  Pt reports that she can not breath and is choking . Parinoia /delusions , tangential /flight of ideas thought processes and preservations . Pt reports that she needs to be medically hospitalized .  Pt continues to  Verbalize that she can not swallow and that she needs to be admitted to the hospital       Substance Use:  Family reports no substance abuse .      History of Mental Health Treatment:  Pt was diagnosed with Huntington's disease in 2012. This has affected pt mentally as well and pt is currently taking psy medications . Pt [facility] the [facility] on [facility]/16  With delusions believing that the nurses at her facility  Were giving her substances to make her sick , she was also refusing to take her psyc meds at this time .  At this time Pt also thought she was having a heart attack Diagnosis at this time : Major neurocognitive disorder with behavioral disturbances . pts out-patient psychiatrist is Dr. Vivi Ferns at [facility]  and her neurologist is Dr. Gerilyn Pilgrim at [facility]. Pt is currently residing at [facility] . Her current meds are Risperdal , trazodone and gabapentin , and klonipin PRN       Collateral:  Husband : Brenda [name] at bedside  [phone] Husband reported that he had lunch today wit the pt and she had no problems eating  The family reports that this episode is identical to her psychiatric outburst  Previously . Pt was admitted to [facility] center of [facility]  Four years ago for 6 days  due to similar episode . Husband reports that pt mother has a history of huntington's       Telepsychiatry Considerations:  Pt is too psychotic too participate in telemed       Formulation of Plan: Pt was placed on a sob by MD and Pt will be  Transported to [facility] for further psychiatric evaluation when she is medically cleared       Patient Reaction to Plan:  Pt states she needs to be medically admitted       Handoff of Communication:  Report was called to  sw at [facility]       Transportation Plan:  Mobile care will be called to arrange for transportation . Eta IS 3:35AM  Della Goo MSW, LISW  Psychiatric Social Worker   [facility] [phone]

## 2016-07-26 NOTE — ED Notes (Signed)
Patient presents to [facility] from [facility] on SOB. On presentation to [facility] as she was getting up and off of the stretcher the patient became extremely agitated and anxious. She began screaming out and pushing ambulance transporters in an attempt to get back onto the stretcher.  She began screaming take me back to the care center!.  Despite attempts to de-escalate and instruct patient she continued with jumping and pushing to get back onto the stretcher.  She was also screaming and saying I can't swallow repeatedly.   She ultimately required 4-point leather restraints. She was administered Geodon 10 mg. And Ativan 2 mg. IM.

## 2016-07-26 NOTE — Unmapped (Addendum)
[facility] Psychiatric Emergency  Service Evaluation    Reason for Visit/Chief Complaint: Agitation      Patient History     HPI  61yo woman with hx of Huntington's disease who was transferred from [facility] after requesting psychiatric evaluation from her husband. She was noted to be agitated upon arrival and received dose of geodon and ativan. She was remained calm throughout the night, and this morning was noted to be pleasant, requesting psychiatric evaluation. She reports that she became distraught at nursing home because she was having difficulty swallowing. This is noted in her hx, and she has an altered diet for this. She states that she requested medical work-up for this. She is currently some depressed mood in thinking about her current illness, although denies suicidal ideation, confusion, difficulty thinking, racing thoughts, and sleep disturbance. She is declining psychiatric admission as well as medicaitons at this time, requesting to go home for breakfast.     Spoke with husband over phone, who reports that she has been having increasing anxiety in the SNF about her swallowing and illness. He states that she is safe in SNF, but becomes difficult to console in SNF. Attempted to complete referral to Dr. Vivi Ferns, who started her on risperdal and gabapentin and has follow-up in mood disorder clinic.     Reviewed records, and appears to have similar presentation at her last psychiatry appointment in October 2017.     [facility] Triage Screening:  Broset score:             PSS- Safety Screen Score: 1  Suicide Screen: Is patient expressing suicidal ideations?: No    Is Admission due to self harm?: No    Has Patient Attempted Suicide or Self Harm in past 72 hours?: No    Is Patient experiencing acute agitation, anxiety or insomnia?  : Unable to assess    Context: stress  Location: Altered mental status of anxiety  Duration: chronic  Severity: mild to moderate.  Associated Symptoms: mild to moderate.  Modifying Factors: other:  illness progression.  Timing: intermittent.      Past Psychiatric History:   Evaluated in [facility] in October 2017. Presentation at that time also required emergency medications. At discharge was noted to be at baseline, and instructed to continue with outpatient services. Previous hospitalizations a few years prior for anxiety.     Hospitalizations: yes - please see above.    Past suicide attempts: no.    History of violence: no.    Substance Use History: None.    PMH:       Past Medical History:   Diagnosis Date   ??? Anxiety    ??? Asthma    ??? Chronic sinusitis 05/24/2011   ??? Environmental allergies    ??? Esophageal reflux 08/10/2010   ??? Fatigue 05/24/2011   ??? Fracture of left orbit (HCC) 2010   ??? Hematoma, left eyebrow 04/18/2009   ??? 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 Osteopenia 08/10/2010   ??? Superficial injury of face without infection 04/12/2009     I have reviewed the past medical history.  Additional history obtained: no    Social History:    Work History:  disability    Social History     Social History   ??? Marital status: Married     Spouse name: N/A   ??? Number of children: N/A   ??? 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 Smoking status: Never Smoker   ??? Smokeless tobacco: Never Used   ??? Alcohol use 0.6 oz/week     1 Cans of beer per week      Comment: Occasionally (per [facility] into)   ??? Drug use: No      Comment: per [facility] into   ??? Sexual activity: Yes     Partners: Male     Other Topics Concern   ??? Caffeine Use Yes   ??? Occupational Exposure No   ??? Exercise Yes   ??? Seat Belt Yes     Social History Narrative   ??? None     I have reviewed the past social history.  Additional history obtained: no.    Family History:    Family History   Problem Relation Age of Onset   ??? Pulmonary embolism Mother    ??? Huntington's disease Mother 72   ??? Alzheimer's disease Father    ??? Depression Sister    ??? Suicidality Sister      attemped suicide 2005   ??? Anxiety disorder  Sister    ??? Huntington's disease Maternal Uncle      I have reviewed the past family history.  Additional history obtained: no.    Medications:  Previous Medications    CLONAZEPAM (KLONOPIN) 1 MG TABLET    Take 2 tablets (2 mg total) by mouth at bedtime.    GABAPENTIN (NEURONTIN) 300 MG CAPSULE    Take 2 capsules (600 mg total) by mouth 3 times a day. Midday dose is as needed for agitation    RISPERIDONE (RISPERDAL) 1 MG TABLET    Take 1 tablet (1 mg total) by mouth 2 times a day. Take 1 mg in the morning 3 mg at bedtime.    TRAZODONE (DESYREL) 100 MG TABLET    Take 1 tablet (100 mg total) by mouth at bedtime.       Allergies:   Allergies as of 07/26/2016 - Fully Reviewed 07/26/2016   Allergen Reaction Noted   ??? Zithromax [azithromycin] Anaphylaxis 06/08/2011   ??? Bactrim [sulfamethoxazole-trimethoprim] Nausea Only and Hives 06/08/2011   ??? Erythromycin Diarrhea 07/30/2011   ??? Erythromycin base Diarrhea and Nausea And Vomiting    ??? Methylphenidate Hives 05/08/2013   ??? Sulfa (sulfonamide antibiotics) Hives 03/26/2011       Review of Systems     Review of Systems      Physical Exam/Objective Data     ED Triage Vitals   Vital Signs Group      Temp 07/26/16 0435 98.9 ??F (37.2 ??C)      Temp Source 07/26/16 0435 Oral      Heart Rate 07/26/16 0418 76      Heart Rate Source 07/26/16 0418 Automatic      Resp 07/26/16 0418 17      SpO2 07/26/16 0418 98 %      BP 07/26/16 0418 153/70      BP Location 07/26/16 0435 Left arm      BP Method 07/26/16 0418 Automatic      Patient Position 07/26/16 0418 Lying   SpO2 07/26/16 0418 98 %   O2 Device 07/26/16 0418 None (Room air)       Physical Exam    Mental Status Exam:     Gait and Muscle Strength:  Normal and Muscle strength intact  Appearance and Behavior: Calm, Cooperative and Open Historian      Groomed and NL Body  Habitus  Speech: NL articulation, prosody, volume and production  Language: Naming intact  Mood: euthymic  Affect: appropriate and full range  Thought Process and  Associations: goal directed and no derailment       No loose associations  Thought Content: no suicidal/homicidal with plan and contracting for safety  Abnormal or psychotic thoughts: None  Orientation: person, place, time/date and situation  Memory: recent, remote, and immediate recall intact  Attention and Concentration: intact  Abstraction: intact  Fund of Knowledge: average  Insight and Judgement: Partial     Fair        Labs:    Please see electronic medical record for any tests performed in the ED.    Recent Results (from the past 24 hour(s))   Basic metabolic panel    Collection Time: 07/26/16  1:31 AM   Result Value Ref Range    Sodium 136 133 - 146 mmol/L    Potassium 3.4 (L) 3.5 - 5.3 mmol/L    Chloride 103 98 - 110 mmol/L    CO2 23 21 - 33 mmol/L    Anion Gap 10 3 - 16 mmol/L    BUN 21 7 - 25 mg/dL    Creatinine 1.61 0.96 - 1.30 mg/dL    Glucose 045 (H) 70 - 100 mg/dL    Calcium 8.8 8.6 - 40.9 mg/dL    Osmolality, Calculated 286 278 - 305 mOsm/kg    eGFR AA CKD-EPI >90 See note.    eGFR NONAA CKD-EPI >90 See note.       Radiology and EKG:  No results found.    EKG: Please see electronic medical record for any studies performed in the ED.    Emergency Course and Plan     VERLIN DUKE is a 61 y.o. female who presented to the emergency department with Agitation    Anxiety - current presentation actually appears to be at her baseline. This morning she is cooperative, eating and drinking, and requesting to return to her SNF for breakfast. She does state that she is having difficulty swallowing, and was requesting further evaluation for this. Discussed at length her anxiety, as well as her symptoms of Huntington's, and that she may benefit from SSRI. She is declining treatment at this time, and reports that she is worried about side effects of additional medications. Discussed case with her husband, who is worried about her worsening anxiety, and sometimes seems uncontrollable at the nursing home. Did  reiterate that although she did appear anxious and agitated when she first arrived to [facility], she is now calm, and denying psychiatric complaints. Discussed that she is declining additional medicaitons at this, is refusing psychiatric admission, and appears to have capacity to make these decisions at this time. Did discuss the consideration of consulting psychiatry at her SNF, as husband admits that transportation is an issue for them. Will route note to her primary psychiatrist at [facility] as well as Dr. Vivi Ferns. Other potential possibilities include evlauation by geriatric psychiatry for further assistance.      Diagnosis:    Primary psychiatric Diagnosis: GAD  Other psychiatric Diagnoses: None  Substance Use Diagnoses: None  Medical Diagnoses: Huntington's disease    Global assessment of functioning: 41-50 serious symptoms    Disposition:      Discharged from the ED. See AVS for prescriptions, followup, and discharge instructions.   No emergency medical condition present at discharge., Patient not deemed to be an imminent threat of harm to self or others.  and Patient has a good safety plan and discharge disposition in place.   Summary of rationale for disposition: she is denying suicidal ideation, is calm and cooperative, has safe place for discharge, and is contracting for safety.    Provider completing note: Attending.    Patient was in OTA.    Patient had a completed Statement of Belief during this encounter: yes  , released by attending physician.    Medications given in PES: yes.  Medications prescribed for home or inpatient use: no.  Laboratory work ordered: yes.  Other diagnostic studies ordered: no.  Old and/or outside medical records reviewed: yes.  Collateral information contacted: yes.  Patient's outside provider contacted: yes.    Legrand Rams, MD  [facility]           Baker Janus, MD  07/26/16 1624       Baker Janus, MD  07/26/16 [phone]

## 2016-07-26 NOTE — Telephone Encounter (Signed)
Called [facility] [phone] spoke with Thayer Ohm.  He is going to fax over medication list and he said she is scheduled to see psychiatry but he is not sure when there therapist will be at the facility next.  Psychiatry only comes to facility once or twice a month.

## 2016-07-27 NOTE — Unmapped (Signed)
LM on VM for Kathlene November (husband) to return my call to go over message per Dr. Gerilyn Pilgrim.

## 2016-07-27 NOTE — Unmapped (Signed)
Pt's husband is returning MA call, given MD msg, verbalized understanding.

## 2016-07-27 NOTE — Telephone Encounter (Signed)
Please tell patient's husband that I spoke with the psychiatrist at the nursing facility who is going to be seeing Nedra Hai next month.  I told him I would be happy to make any of the medication changes until he is able to see her.  Please let him know I also spoke to the nurse a the nursing facility who said Tenea has been doing well over the last day.  I told them for sleep they can add clonazepam 0.5 mg at bedtime scheduled and continue the 0.25 mg dose just as needed. I told them to call me should Britten have any issues.

## 2016-07-28 NOTE — Unmapped (Signed)
Brenda [name] / [facility]. Asks if Geodon is a medication that the pt could take. States the medication worked well for her. Please advise as soon as possible.

## 2016-07-29 NOTE — Unmapped (Signed)
Brenda [name] is calling to follow up with the physician in regards to [facility], would like to know how the pt is doing on medication. Please advise.

## 2016-07-29 NOTE — Telephone Encounter (Signed)
Spoke to nurse Raynelle Fanning. Patient having more agitation/yelling. Ordered Geodon 10 mg  IM prn once daily.

## 2016-07-29 NOTE — Unmapped (Signed)
Returned call to nursing home.  Spoke to Sam.  Raynelle Fanning is her regular nurse but away from her desk.  Started Geodon in the hospital and was continued until today.  She had a dose while in the ER.  They were wanting a prn order for Geodon.  I told her you didn't prescribe in hospital.  They didn't know any other doctor that would prescribe.

## 2016-07-30 MED ORDER — QUEtiapine (SEROQUEL) 25 MG tablet
25 | ORAL_TABLET | Freq: Every evening | ORAL | 11 refills | Status: AC
Start: 2016-07-30 — End: 2017-08-25

## 2016-07-30 NOTE — Unmapped (Signed)
Please tell him I was going to use the geodon IM on an as needed basis for now but if she needs it on a regular basis we could do scheduled Geodon but that would then replace the risperidone.

## 2016-07-30 NOTE — Telephone Encounter (Signed)
Returned call to [place].  Relayed message.  He states when she gets manic, she won't be able to sit still to get an injection.  He is worried they won't be able to go that route.  He is afraid that someone coming at her with a needle when she is like this would not be a good idea.  She has had episodes 4-5 times in the past 2 months.  Episodes last a minute to 2-3 hours.  They always occur at night.  He states the nurses said she is quick when she has these manic attacks.  He says she will not handle taking the Risperdal away because she knows the colors of her pills and if something is taken away, she will know.  He is wondering about putting her back on clonazepam 0.25 mg 4 times a day instead of just 0.5 mg prn at night. She seemed to do very well on that.  He is very frustrated with her behavior and wants it to be under control.  This call lasted 22 minutes.

## 2016-07-30 NOTE — Unmapped (Signed)
Spoke to [place].  He is agreeable to these instructions.  He would prefer to have [facility] due to cost at the [facility] versus nursing home.    Called to nursing home, [phone], gave order to Rod.

## 2016-07-30 NOTE — Unmapped (Signed)
Pt's husband is calling to speak with someone regarding script for geodon injection that was sent to the pharmacy. Asking if pt could have this med in pill form on a regular basis to help to not get to the manic state and allow pt to sleep at night. Pls advise.

## 2016-07-30 NOTE — Unmapped (Signed)
Please tell him she should be getting clonazepam 0.5 mg SCHEDULED at bedtime and 0.25 mg 4 times daily as needed.  Why don't we try adding quetiapine 25 mg PO at bedtime scheduled for now as this is a medication like geodon but it can also help with sleep.  If he is agreeable to this, please call verbal in to nursing facility.

## 2016-07-30 NOTE — Unmapped (Signed)
Please advise.

## 2016-08-13 NOTE — Unmapped (Signed)
Husband states he would like to speak with the physician or MA regarding the pt's medication, states she gets very agitated at times, would like to know does the pt need to increase Clonazepam. Please advise.

## 2016-08-13 NOTE — Telephone Encounter (Signed)
Spoke with pt's spouse Kathlene November. States that in the last 2 weeks friends and family has noticed increase of anger and agitation from the pt. States that pt has had a decrease of interest in social activities. Is not going to dining room for meals because she makes too much noise and may hurt someone. Pt was started on Seroquel 25 mg qhs which has helped the pt sleep through the night. Husband is concerned with the pt bring discharged from the NH due to behaviors.

## 2016-08-16 NOTE — Telephone Encounter (Signed)
Discussed with Dr. Gerilyn Pilgrim.  We will increase her quetiapine to 37.5 mg at hs and add 12.5 mg at 10>00 AM.

## 2016-08-16 NOTE — Telephone Encounter (Signed)
I was unable to make the change to Seroquel in Epic.  It said it was in a future encounter.  [facility] Adella Nissen request:  Quetiapine 25 mg tablets: 0.5 tab at 10 AM and 1.5 tabs at hs.  #60 with 11 refills.    I tried to call [facility] at [phone] but no one would pick up.

## 2016-08-16 NOTE — Telephone Encounter (Signed)
I had a long conversation with the patient's husband.  Brenda [name] is more paranoid and having more delusions and agitation.  She is no longer going to activities, she said I can't go, I'm afraid of hurting someone.  She is repeating herself compulsively, they are poisoning me, I can't take the gabapentin, it makes me feel awful, I can't swallow.

## 2016-08-16 NOTE — Telephone Encounter (Signed)
I called [facility] and gave the order for the increased quetiapine.

## 2016-08-18 NOTE — Telephone Encounter (Signed)
Please tell patient's husband that Dr. Dannielle Huh and I spoke today. He is planning to wean the gabapentin and increase clonazepam which I said I am on board with.

## 2016-08-18 NOTE — Telephone Encounter (Signed)
Spoke to husband, Kathlene November.

## 2016-08-18 NOTE — Telephone Encounter (Signed)
Pt's husband would like to know has Dr Gerilyn Pilgrim spoke with Dr Clelia Schaumann(?), also would like to speak with Dr Gerilyn Pilgrim regarding the pt changing from Gabapentin to Clonazepam. Please advise pt's husband.

## 2016-08-18 NOTE — Telephone Encounter (Signed)
Dr Clelia Schaumann states he would like to speak with Dr Gerilyn Pilgrim regarding the pt. Please advise.

## 2016-08-27 NOTE — Telephone Encounter (Signed)
Director of nursing at [facility] is calling to speak with someone regarding Geodon. Needs information pertaining to this med. Pls advise.

## 2016-08-27 NOTE — Telephone Encounter (Signed)
Returned call to Rod.  Nursing home says they need a note stating why the Geodon needs to be continued prn to avoid going to the hospital.  They get audited every 14 days.  The Geodon is working when she needs it.    Fax [phone]

## 2016-08-27 NOTE — Telephone Encounter (Signed)
Letter to be signed in Dr. Larae Grooms mail folder

## 2016-09-01 MED ORDER — traZODone (DESYREL) 100 MG tablet
100 | ORAL_TABLET | Freq: Every evening | ORAL | 3 refills | Status: AC
Start: 2016-09-01 — End: 2017-08-25

## 2016-09-01 NOTE — Telephone Encounter (Signed)
Last appt 05/25/16  Next appt 09/29/16

## 2016-09-10 ENCOUNTER — Encounter: Payer: Managed Care, Other (non HMO) | Admitting: Family Medicine

## 2016-09-16 NOTE — Telephone Encounter (Signed)
Spoke to [place], husband.  Appointment cancelled.  He is working closely with psych to get her stable.  He will call back to reschedule.

## 2016-09-16 NOTE — Telephone Encounter (Signed)
Has appt 09/29/16.  Please advise.

## 2016-09-16 NOTE — Telephone Encounter (Signed)
Husband requests call back regarding upcoming appt with Dr Gerilyn Pilgrim, pt is in [facility] psych unit and he feels it would not work well for him to drive her to appt(cannot control her when he's driving) and she doesn't want to be at [facility], is having paranoia about the staff there, does not think it wise to pull her out of facility for an appt.  Please advise husband, is appt absolutely necessary?

## 2016-09-16 NOTE — Telephone Encounter (Signed)
Tell him I totally understand.  We can cancel her appointment and maybe when she is more stable from a psych standpoint we can reschedule an appointment. No worries.

## 2016-09-22 ENCOUNTER — Encounter: Payer: PRIVATE HEALTH INSURANCE | Attending: Neurology

## 2016-09-28 NOTE — Telephone Encounter (Signed)
Pt's husband is calling to speak with Dr Gerilyn Pilgrim regarding geodon. Pls advise.

## 2016-09-28 NOTE — Telephone Encounter (Signed)
Dr. Gerilyn Pilgrim returned call to [facility], director.  Per Dr. Gerilyn Pilgrim, psychiatrist just got back into town.  He is going to adjust medications.  Spoke to husband Kathlene November and informed him.

## 2016-09-28 NOTE — Telephone Encounter (Signed)
Rod director of nursing at [facility] is calling to inform that pt's behaviors have continued to escalate. States the psychiatrist is out of town and needs direction fr Dr Gerilyn Pilgrim. States the best way he can explain, is that the pt is flat out off the chain. He states there is something other than Huntington's going on and it is hard to care for other patients with the pt's behavior being this way. Pt takes seroquel 25mg  in am and 50mg  in pm, clonazepam 1mg  qhs, risperdal 1mg  daily, trazodone and klonopin for anxiety and geodon 10mg  daily. Rod states nothing is working for the pt's behaviors. Pt will not eat, thinks she is being poisoned, states she has diarrhea but does not have it, was fixated on tv thinking her son would be on today, he plays professional baseball. Pls advise.

## 2016-09-29 ENCOUNTER — Encounter: Payer: PRIVATE HEALTH INSURANCE | Attending: Neurology

## 2016-10-01 NOTE — Other (Unsigned)
Danielle Lawrence  June 16, 1956  [id number]    IDENTIFICATION DATA AND CHIEF COMPLAINT:  Patient is a 61 y.o. Caucasian female with a history of Huntington's disease   was  a resident of an adult care facility, was brought to the emergency department  secondary to increased paranoia and agitation at the nursing home in the past  week.  Patient was brought for assessment and possible med changes.  Patient   was  seen along with her husband she reports "I have Huntington's and I had to move  away from home.  I need an aide- I need to have physical therapy.  "    HISTORY OF PRESENT ILLNESS:  Danielle Lawrence reports that she struggles with the Huntington's disease and was  diagnosed a few years ago.  Reports that because of that she had to move away  from her home into an adult care facility.  Patient reports that she does not  like it about their reports that the people over there don't like her.    Reports  that they may give up OP food when I asked to describe up will be more food   she  described that as something that Lennice Sites was put in her food as well as it makes  her "more.  Patient was very distressed and agitated caught up a few times   from  a chair saying that she needed to "go to her room and to the bathroom.  She  would return and then have the same theme about people in the nursing home not  liking her.  She would get upset and agitated to when the husband would try to  either talk to her about it or sometimes give his in put.  Patient reported  having anxiety.  She reports otherwise her moods are okay reports she reports  that she likes to work with an aid and she likes to do physiotherapy and she  wants to have that but she does not have those provisions at the aftercare  facility.  She is sad.  She reports that her appetite is fair and she reports  that she sometimes can have some difficulty swallowing.  But her doctor gives  her medications for that.  She would get up from time to time while talking to  me and say  "sorry" P "closed.  She denied any suicidal ideations, homicidal  ideations, or hallucinations however reported paranoia.  Onset of symptoms was  ongoing for the past 3 years but more recently in the past couple of weeks it  has been more severe.  Severity of symptoms: Moderately severe  Quality: Continuous, present through the day, leading to distorted reality at  times and paranoia  Exacerbating factors: Underlying Huntington's disease  Modifying factors: Comorbid anxiety and paranoia.    PAST PSYCHIATRIC HISTORY:  Danielle Lawrence has history of Huntington's disease and mood disturbance secondary   to  that.  She has poor tolerance to antidepressant medications.  This is the first psychiatric hospitalization.  Suicide attempts--she denies  Past Psychiatric diagnosis: Huntington's disease and mood disturbance   secondary  to that also history of OCD  Currently in treatment with her neurologist.- Gust Rung    MEDICAL HISTORY:  Allergies  Allergen Reactions  ?????? Bactrim [Sulfamethoxazole-Trimethoprim]  ?????? Erythromycin  ?????? Methylphenidate  ?????? Sulfa (Sulfonamide Antibiotics)  ?????? Zithromax [Azithromycin]    Past Medical History:  Diagnosis Date  ?????? GERD (gastroesophageal reflux disease)  ?????? Huntington disease (  CMS HCC)  ?????? 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 Overactive bladder    Prescriptions Prior to Admission  Medication Sig Dispense Refill Last Dose  ?????? acetaminophen (TYLENOL) 500 mg tablet Take 325 mg by mouth every 4 hours   as  needed for Pain.  ?????? bisacodyl (DULCOLAX) 5 mg EC tablet Take 5 mg by mouth daily as needed for  Constipation.  ?????? CALCIUM ACETATE PO Take  by mouth.  ?????? Cholecalciferol, Vitamin D3, 1,000 unit Capsule Take 1,000 Units by mouth  daily.  ?????? Clonazepam (KLONOPIN) 0.25 mg disintegrating tablet Take 0.25 mg by mouth   2  times daily.  ?????? clonazePAM (KLONOPIN) 0.5 mg tablet Take 0.5 mg by mouth as needed.  ?????? clonazePAM (KLONOPIN) 1  mg tablet Take 1 mg by mouth nightly at bedtime.  ?????? FA/MV,CA,IRON,MIN/LYCOPENE/LUT (MULTIVITAL PO) Take  by mouth.  ?????? fluticasone (FLONASE) 50 mcg/actuation nasal spray Spray 1 Spray into nose  daily.  ?????? mirabegron (MYRBETRIQ) 50 mg Tablet Sustained Release 24 hr Take 50 mg by  mouth daily.  ?????? nitroglycerin (NITROSTAT) 0.4 mg SL tablet Place 0.4 mg under tongue every   5  minutes as needed for Chest pain.  ?????? omeprazole (PRILOSEC) 20 mg capsule Take 40 mg by mouth daily.  ?????? QUEtiapine (SEROQUEL) 50 mg Tablet Take 50 mg by mouth 3 times daily.  ?????? risperiDONE (RISPERDAL) 1 mg Tablet Take 1 mg by mouth daily.  ?????? risperiDONE (RISPERDAL) 3 mg Tablet Take 3 mg by mouth nightly at bedtime.  ?????? traZODone (DESYREL) 100 mg tablet Take 100 mg by mouth nightly at bedtime.  ?????? ziprasidone (GEODON) 20 mg/mL (final conc.) Recon Soln 10 mg by   Intramuscular  route every 4 hours as needed.    No past surgical history on file.    PSYCHOSOCIAL HISTORY:  Social History  Substance Use Topics  ?????? Smoking status: Never Smoker  ?????? Smokeless tobacco: Never Used  ?????? Alcohol use No    Education: Masters in education.  Other Pertinent History: She is a mother of 2 children.  She is married.  She  reports that she has a lot of interests.  FAMILY HISTORY OF PSYCHIATRIC ILLNESS  Her mother had Huntington's disease to.    SUBSTANCE ABUSE HISTORY:  She denies any**    LEGAL HISTORY    Danielle Lawrence denies any legal history.      REVIEW OF SYSTEMS:    Comprehensive ROS is negative other than what is listed in history of present  illness  She denies complaints of fever,headache, migraines, seizures, chest pains,   SOB,  cough, nausea, diarrhea, vomitting.she complains of ongoing difficulty with   her  gait swallowing tremors results of Huntington disease.    Patient Vitals for the past 8 hrs:   BP Temp Temp src Pulse Resp SpO2  10/01/16 0733 155/87 98.3 ??????F (36.8 ??????C) Oral 95 17 97 %      PSYCHIATRIC  EVALUATION:.    MSE:  Appearance.  Middle-aged Caucasian female, looks older than stated age, seems  somewhat uncomfortable -dressed in hospital gown.  Behavior:  cooperative -does not make good eye contact, some increased   blinking,  also restless somewhat akathisic,  also underlying irritability.  Attention span:  limited to fair.  Concentration:  limited to fair.  Language: intact.  Musculoskeletal : gait unsteady, muscle tone increased, tremors present  Fund of knowledge: average.  Alert and oriented to time place  and person.  Mood:  dysphoric  Affect:  mood congruent  Speech: nl in rate tone and vol  Thought process: organized.  Thought content: no paranoia , no  auditory, visual hallucinations.  Thought association: goal directed.  SHI: none, contracts for safety.  Memory:Limited  I/J.: limited.      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 GERD (gastroesophageal reflux disease)  ?????? Huntington disease (CMS HCC)  ?????? 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 Overactive bladder    Axis IV: Her illness itself  Axis V: 25  Admission: Psychosis, paranoia  Patient was admitted to psychiatric inpatient unit  Indications for admission:  Record Review: moderate.  Vital signs and labs reviewed.  Patient  list of medication, previous records, allergies,  vital sign , labs,  pharmacy records ER records all have been reviewed.  Collateral information obtained from the husband  Social worker requested to get further collateral information and to schedule  follow up appointment for the patient upon dc.  Occupational therapy consultation for evaluation of ADLs and cognition  Nurse practitioner consultation to address medical issues  Encouraged pt. to  attend groups and activities.  Educated pt. about their illness, risk benefit and side effects  of meds.  Pt. feels comfortable with the plan and does not have any objections.  At this time I certify that in patient psychiatric hospitalization is   necessary  for appropriate treatment and care for the patient.  Length of stay for the patient is 3-5 days.  Recent Results (from the past 72 hour(s))  CBC (COMPLETE BLOOD COUNT)   Collection Time: 09/30/16  2:21 PM  Aletheia Tangredi Value Ref Range   WBC 6.7 3.8 - 10.8 10*3/uL   RBC 4.49 3.80 - 5.10 10*6/uL   Hemoglobin 13.1 11.7 - 15.5 g/dL   Hematocrit Blood 16.1 35.0 - 45.0 %   MCV 85.7 80.0 - 100.0 fL   MCH 29.1 27.0 - 33.0 pg   MCHC 34.0 32.0 - 36.0 g/dL   RDW 09.6 04.5 - 40.9 %   Platelets 194 140 - 400 10*3/uL   MPV 9.5 7.5 - 11.5 fL  DIFFERENTIAL   Collection Time: 09/30/16  2:21 PM  Monquie Fulgham Value Ref Range   Neutrophils Relative 70.1 %   Lymphocytes Relative 20.9 %   Monocytes Relative 6.9 %   Eosinophils Relative 1.3 %   Basophils Relative 0.8 %   Neutrophils Absolute 4.7 1.5 - 7.8 10*3/uL   Lymphocytes Absolute 1.4 0.8 - 3.9 10*3/uL   Monocytes Absolute 0.5 0.2 - 0.9 10*3/uL   Eosinophils Absolute 0.1 0.0 - 0.5 10*3/uL   Basophils Absolute 0.1 0.0 - 0.2 10*3/uL  BASIC METABOLIC PANEL (BMP=EP1)   Collection Time: 09/30/16  2:28 PM  Rabab Currington Value Ref Range   Sodium 138 135 - 146 mmol/L   Potassium 3.8 3.5 - 5.1 mmol/L   Chloride 105 98 - 110 mmol/L   CO2 25 22 - 29 mmol/L   Anion Gap 8 5 - 13 mmol/L   BUN 15 7 - 25 mg/dL   Creatinine 8.11 9.14 - 1.20 mg/dL   Glucose 91 70 - 99 mg/dL   Calcium 9.9 8.4 - 78.2 mg/dL   BUN/Creatinine  Ratio 19   GFR MDRD Af Amer 91 See Note   GFR MDRD Non Af Amer 76 See Note  TSH (THYROID STIMULATING HORMONE)   Collection Time: 09/30/16  2:28 PM  Tayra Dawe Value Ref Range   TSH 0.65 0.35 - 4.94 uIU/mL  LIPID PROFILE   Collection Time: 10/01/16  6:17 AM  Amedee Cerrone Value Ref Range   Chol/HDL Ratio 4.6 0 - 5   Cholesterol 226 (HIGH)  125 - 199 mg/dL   LDL Calculated 161 (HIGH) 0 - 100 mg/dL   HDL 49 40 - 096 mg/dL   Triglycerides 99 0 - 150 mg/dL

## 2016-10-01 NOTE — Other (Unsigned)
------------------------------------------------------------------------------  --  Attestation signed by Shela Leff., OTR/L, [facility] at 10/01/16 1106  I have read and agree with the student occupational therapist's note.  I agree  with the treatment rendered and plan of care.  OTR/L immediately available to  S/OT during session.  Jacelyn Grip, OTR/L (pager [phone])  ------------------------------------------------------------------------------  --    Inpatient Occupational Therapy Consult Note  Behavioral Health OT Functional Assessment    PATIENT NAME: Danielle Lawrence  DATE OF BIRTH: 02/21/1956  ADMITTING: Nile Riggs, MD  ATTENDING: Jannifer Hick, MD  PCP: Glory Rosebush., MD  MRN#: [id number]  ROOM: 6061/1  ADMIT DATE: 09/30/2016   LOS: 0 days  DATE OF SERVICE: 10/01/2016      Pt is a 61 year old female presenting with agitation. PMH: Huntingtons, GERD,  behavioral disorders. Per chart review, pt has been living in a nursing home   and  came to the unit because she was not taking her medications,  Screaming and  screeching. Pt reported she needs gets assist with ADLS and the facility helps  with all her IADLS. Pt reports she enjoys watching baseball and getting her  nails done. Pt reports her goals is to "go back to the facility and watch  baseball." Pt was pleasant cooperative and no behavioral deficits were noted.   No  OT services are recommended at this time due to pt needs being met by the  nursing facility. Recommend groups: anger/emotional control, wellness.        History and Prior Level of Function  Precautions: Agitated  (PMH: Huntingtons, )  Occupation: disability  Living Situation: [place] does Patient Reside: Alone  Transportation: Surveyor, quantity, Family / Friend Drives (husband  sometimes drives pt)  Self Care Function Prior to Admission: [place] for Bathing or  Dressing, Other (specify) (dysphagia 3 diet )  Functional Mobility: Requires Assistance  for Some Transfers, Uses Device  (specify) (pt reports she can walk short distances, uses a wheel chair )  Meal Preparation: Facility Completes  Money Management: Family / Friends Complete (pt husband takes care of finances   )  Medication Management: Facility Completes        ADLs  Assistance Level Obtained: Per Patient Report  Feeding: Requires Setup or Cues  Toileting: Independent / Modified Independent  Grooming Hygiene: [place] / Dressing: Requires Supervision or Cues      Sensory  Vision: No Apparent Deficits  Hearing: Hearing without Difficulty / Device      Cognition    Anticipated Attention Span: 5 Minutes or Less  Follows Commands: Does not Follow 1 Step Commands  Affect and Behavior: Lethargic, Distracted, Cooperative    Leisure Participation  With Whom Does Patient Spend Free Time: Alone  Leisure Interests: Yes  Physical Activities: Sports (Pt reports she likes to watch baseball)        Leisure Participation: Needs to [employer] Leisure Interests    Assessment / Plan  Barriers to Activity Participation: Cognitive Limitation, Physical Function or  Vision  Patient Strengths: Hydrologist  Patient Education Needs: Stress Management, Anger / Emotional Control,   Software engineer, Wellness  Recommended Groups: [employer], Fitness, Wellness    No OT services are recommended at this time due to pt needs being met by the  nursing facility.    Occupational Therapy Evaluation Charge Reference Behavioral  Occupational Profile & Medical History:  Refer to history and prior level of  function section above  Brief history Low  Expanded review Mod x  Extensive review High  Assessments of Occupational Performance:  Refer to sections above including ADLs, sensory, cognition, and leisure  participation.  1-3 performance deficits Low  3-5 performance deficits Mod x  5 or more performance deficits High  Clinical Decision Making:  Refer to assessment/plan section above.  Low analytical  complexity, limited treatment options, no assessment  modifications, no co-morbidities Low x  Moderate analytical complexity, min-mod assessment modifications, mod   treatment  options, may have co-morbidities Mod  High analytical complexity, comprehensive assessments, multiple treatment  options, significant modifications of assessments, co-morbidities that affect  performance High    1Low level OT eval  8 minutes        Therapist: Rogers Blocker. S/OT  Date: 10/01/2016

## 2016-10-01 NOTE — Other (Unsigned)
Geriatric Internal Medicine  Consult Note      Reason for Consult / Chief Complaint:  Dr Johnnette Barrios, MD asked me to see Danielle Lawrence for medical management of  Huntington's.      HPI:    Danielle Lawrence is a 61 y.o. Caucasian female with PMH including GERD and  Huntingtons who was admitted for agitation-resides at [facility]  where she has reportedly become more agitated. Asked to see pt for   Huntington's  disease. Unfortunately, pt confused and wishing to sleep on exam-unable to  provide history. Unsure when pt was diagnosed-when asked she only states  "gradually." Per [facility] records, she [facility] Gerilyn Pilgrim.   Records  currently not available via care everywhere. Pt seen in bed, seems restless,  although possibly choreiform movements r/t Huntingtons. Oriented to self only.  Repeatedly stating she wanted to go back to sleep. Denies SOB, pain, N/V,  diarrhea and constipation. Appears in no acute distress-respirations easy and  unlabored. Nursing home paperwork and labs done 3/16 reviewed.    Review of Systems:  10 point ROS negative unless noted above. Pertinent items are noted in HPI.    -Information reviewed and obtained from: records from here, prior outside  facility, speaking with patient's nurse, and patient interview.      Medical History:    PMH:  Past Medical History:  Diagnosis Date  ?????? GERD (gastroesophageal reflux disease)  ?????? Huntington disease (CMS HCC)  ?????? 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 Overactive bladder    PSH: Negative    SH:  Social History  Substance Use Topics  ?????? Smoking status: Never Smoker  ?????? Smokeless tobacco: Never Used  ?????? Alcohol use No    FH: Negative  Allergies:  Allergies  Allergen Reactions  ?????? Bactrim [Sulfamethoxazole-Trimethoprim]  ?????? Erythromycin  ?????? Methylphenidate  ?????? Sulfa (Sulfonamide Antibiotics)  ?????? Zithromax [Azithromycin]      PTA Meds:  Prescriptions Prior to Admission  Medication  Sig Dispense Refill Last Dose  ?????? acetaminophen (TYLENOL) 500 mg tablet Take 325 mg by mouth every 4 hours   as  needed for Pain.  ?????? bisacodyl (DULCOLAX) 5 mg EC tablet Take 5 mg by mouth daily as needed for  Constipation.  ?????? CALCIUM ACETATE PO Take  by mouth.  ?????? Cholecalciferol, Vitamin D3, 1,000 unit Capsule Take 1,000 Units by mouth  daily.  ?????? Clonazepam (KLONOPIN) 0.25 mg disintegrating tablet Take 0.25 mg by mouth   2  times daily.  ?????? clonazePAM (KLONOPIN) 0.5 mg tablet Take 0.5 mg by mouth as needed.  ?????? clonazePAM (KLONOPIN) 1 mg tablet Take 1 mg by mouth nightly at bedtime.  ?????? FA/MV,CA,IRON,MIN/LYCOPENE/LUT (MULTIVITAL PO) Take  by mouth.  ?????? fluticasone (FLONASE) 50 mcg/actuation nasal spray Spray 1 Spray into nose  daily.  ?????? mirabegron (MYRBETRIQ) 50 mg Tablet Sustained Release 24 hr Take 50 mg by  mouth daily.  ?????? nitroglycerin (NITROSTAT) 0.4 mg SL tablet Place 0.4 mg under tongue every   5  minutes as needed for Chest pain.  ?????? omeprazole (PRILOSEC) 20 mg capsule Take 40 mg by mouth daily.  ?????? QUEtiapine (SEROQUEL) 50 mg Tablet Take 50 mg by mouth 3 times daily.  ?????? risperiDONE (RISPERDAL) 1 mg Tablet Take 1 mg by mouth daily.  ?????? risperiDONE (RISPERDAL) 3 mg Tablet Take 3 mg by mouth nightly at bedtime.  ?????? traZODone (DESYREL) 100 mg tablet Take  100 mg by mouth nightly at bedtime.  ?????? ziprasidone (GEODON) 20 mg/mL (final conc.) Recon Soln 10 mg by   Intramuscular  route every 4 hours as needed.        PHYSICAL EXAM:     Vital signs: BP 122/70    Pulse (!) 108    Temp 98.4 ??????F (36.9 ??????C) (Oral)       Resp 16    SpO2 100%   Constitutional: No acute distress. Well nourished.   Head: Normocephalic; Atraumatic   Eyes: conjunctivae/corneas clear. No discharge. PERRLA, EOMI.   Ears, Nose, Throat: Bilateral external ears intact. Nose midline with  symmetrical nares.   Neck: supple, symmetrical, trachea midline, no adenopathy, thyroid: not  enlarged, no JVD.   Cardiovascular: S1 & S2  intact, no murmurs, gallops, or rubs. No edema;  +pulses.   Respiratory: lungs clear to auscultation bilaterally. Breathing non-labored;   no  crackles, wheezes, or rhonchi. Bilateral chest expansion symmetrical.   Abdomen: Soft, non-tender, non-distended.   Musculoskeletal: atraumatic, no redness & swelling of joints; Full ROM   Neurologic: Cranial nerves appear to be intact 2-12. Able to move all  extremities. Choreiform movements.   Psych: Alert & Oriented x 1-2   Skin:Warm and  Dry, skin turgor normal. No erythema, rashes, or lesions.    There is no height or weight on file to calculate BMI.    I & O: No intake or output data in the 24 hours ending 10/01/16 9528    Recorded Weight:  Wt Readings from Last 3 Encounters:  No data found for Wt    There were no vitals filed for this visit.    Data Reviewed:  Recent Labs     09/30/16   1428  NA  138  K  3.8  CO2  25  BUN  15  CREATININE  0.78    Recent Labs     09/30/16   1421  WBC  6.7  HGB  13.1  HCT  38.5  MCV  85.7  PLT  194    No results found for: CKMB, CKTOTAL, CKMBINDEX, POCCKMB, TROPONINI, POCTNI,  POCTROP    ECG Personally Reviewed: 09/30/16-sinus tachycardia, rate=106    Imaging Reviewed:    No orders to display      CXR: N/A    ECHO: N/A      ASSESSMENT / PLAN:    ?????? Agitation: management per psychiatry.    ?????? Huntington's Disease: sees [facility]y at [facility]-Dr Patrecia Pour. Records   currently  not available in care everywhere. Last [facility] neurolog[facility] OV note requested-faxed  completed form. SLP consult placed-per NH-pureed diet with honey consistency.    ?????? GERD: on PPI    ?????? Overactive bladder: on mirabegron.      ?????? FEN: pending recommendations from SLP  ?????? DVT Prophylaxis: appears to ambulate adequately    Psychiatry MD/psychiatry unit is responsible for verification of all current  medications for reorder. PTA medication reorder is deferred to psychiatry  attending MD.    Iva Lento, NP  Geriatric Hospitalist Nurse Practitioner  [facility]  [facility]  Daytime Phone: [phone]  After Hours Phone: [phone]    Thank you for the consultation and allowing the Geriatric group to participate  in the care of this patient. Please call with questions.

## 2016-10-01 NOTE — Other (Unsigned)
Inpatient Speech Language Pathology Consult Note    Bedside swallow evaluation completed.  Pt sitting fully upright in chair,   alert,  and did not have visitors present.  Pt admitted for agitation requiring  sedation.  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  Pt unable to provide reliable patient history during  evaluation.  Pt came from outside adult care facility on a dysphagia 3 diet   with  honey thickened liquids with allowable nectar thickened liquids if patient is  refusing honey.    Oral mechanism examination completed.  Lingual and labial ROM, strength,  symmetry, and coordination WFL.  Velar movement judged to be adequate upon  visualization during phonation and laryngeal elevation judged to be adequate  upon palpation.  Pt with natural dentition with good oral hygiene.  Pt with  strong volitional cough and clear vocal quality prior to PO.  Trials of thin   via  tsp x3 and puree x2 resulted in no overt s/s of aspiration or penetration.  Pt  with slight agitation towards end of evaluation requesting thin liquids and  remaining applesauce.  Pt stated, "I will participate in the x-ray," following  explanation of MBS from clinician.  Recommend MBS to rule out silent   aspiration  and to recommend safest, least restrictive diet.  Continue dysphagia 3 with   NTL  pending results of MBS.    RECOMMEND:  1. MBS to rule out silent aspiration      Bedside Swallow Evaluation    Baseline Assessment  Temperature spikes noted: No (SPINDLER, ALEXANDRA E.)  Respiratory Status: Normal (SPINDLER, ALEXANDRA E.)  History of Intubation: No (SPINDLER, ALEXANDRA E.)  Dentition: Natural (SPINDLER, ALEXANDRA E.)  Vision: Functional for self-feeding (SPINDLER, ALEXANDRA E.)  Patient Positioning: Sitting upright unsupported (SPINDLER, ALEXANDRA E.)  Baseline Vocal Quality: Normal (SPINDLER, ALEXANDRA E.)  Cough  Reflex: Intact (SPINDLER, ALEXANDRA E.)  Swallow Reflex: Intact (SPINDLER, ALEXANDRA E.)    Motor Speech  Labial: No impairment (SPINDLER, ALEXANDRA E.)  Lingual: No impairment (SPINDLER, ALEXANDRA E.)  Velum: No impairment (SPINDLER, ALEXANDRA E.)  Mandible: No impairment (SPINDLER, ALEXANDRA E.)  Face: No impairment (SPINDLER, ALEXANDRA E.)    Consistencies Assessed  Consistencies Assessed: Thin liquids;Pureed (SPINDLER, ALEXANDRA E.)    Oral Phase  Oral Phase: Normal (SPINDLER, ALEXANDRA E.)    Pharyngeal Phase  Pharyngeal Phase: Normal (SPINDLER, ALEXANDRA E.)    Recommendations  Swallow Strategies: Optimum positioning/upright;Assist/supervision (SPINDLER,  ALEXANDRA E.)  Follow Up Treatments: Diet tolerance monitoring (SPINDLER, ALEXANDRA E.)  Further Assessment: Modified barium swallow (SPINDLER, ALEXANDRA E.)  Oral Diet Level: Dysphagia 3 (SPINDLER, ALEXANDRA E.)  Liquid Diet Level: Nectar (SPINDLER, ALEXANDRA E.)  Dysphagia Goals: Patient will tolerate diet w/out signs of aspiration;Patient  will demonstrate strategies for swallowing safety (SPINDLER, ALEXANDRA E.)        Therapist: Ginger Organ  Date: 10/01/2016

## 2016-10-02 NOTE — Other (Unsigned)
Inpatient Occupational Therapy Progress Note    S:  "My family is coming from [place]."  "I can't swallow."  "Will you tell   them  they are drugging me?"  "I can't stay awake."    O:  Pt seen in lounge for completion of Montreal Cognitive Assessment.  Pt  perseverative and distracted, as well as with frequent restless-appearing  movements; pt with hx Huntington's disease.  Pt requesting to take shower but  then declines participation when offered.  Assessment results:  MoCA: 12/30 (Criterion >26)  Visuospatial/Executive: 0/5 (unable to complete trail making, copying letters  and numbers instead; unable to copy cube accurately; clock with poor contour,   no  numbers, and hands in incorrect position)  Naming: 2/3 (hippo)  Attention: 3/6 (taps hands at inappropriate letters; able to do 1 subtraction)  Language 0/3 (unable to repeat sentences; names 6 words beginning with F)  Abstraction: 1/2 (unable to compare watch and ruler)  Delayed Recall 2/5 (recalls face and velvet)  Orientation: 4/6 (not oriented to date/day but aware that Ivor Costa is near)    A:  Pt with significant difficulty attending to task or initiating ADL  completion.  This represents change from evaluation on prior date.  Therefore,  will establish occupational therapy goals and 2x/week POC.  Goals to be met by 10/09/16 are as follows:  1.  Pt will initiate ADL completion with <2 cues.  2.  Pt will sequence bathing task with <25% cues.    P:  Pt to be seen 2x/week to address ADL completion and cognition for ADLs.    This note to serve as d/c summary if patient d/c's prior to next session.    Charge:  1 OT re-evaluation (10 minutes)    Therapist: Jacelyn Grip, OTR/L, [facility] (pager [phone])  Date: 10/02/2016

## 2016-10-02 NOTE — Other (Unsigned)
C/C-paranoia, agitation, anxiety    Danielle Lawrence was seen, interim history reviewed, chart reviewed- labs reviewed.    Symptoms: She c/o having difficulty swallowing- reports she has her food   pureed-  reports her moods are anxious- upset that she is feeling sleepy- her family is  going to come to visit her today- restless- gets up a few times- reports   feeling  upset- denies SI, denies HI- denies hallucinations-- reports she likes working  with PT and OT- met with her sister- reports that she would like to go to a   [employer]- does not like where she lives. Meds reviewed- I will lower her  risperdal, add cogentin- some of the dysphagia may be related to that. Will   add  low dose zyprexa to target moods and psychosis.  Reports compliance with medications.  Severity:  significant.  Length of time:  few weeks.  Timing : all day long.  Context :  Underling Huntigton's disease.  Mod Fact:  Comorbid anxiety, cognitive issues  Quality: substantial impairment in quality of life.    Compliant with medications and denies any SE.  Past psychiatric history reviewed.  No changes other than what's noted above  MEDICAL HISTORY:  Allergies  Allergen Reactions  ?????? Bactrim [Sulfamethoxazole-Trimethoprim]  ?????? Erythromycin  ?????? Methylphenidate  ?????? Sulfa (Sulfonamide Antibiotics)  ?????? Zithromax [Azithromycin]    Past Medical History:  Diagnosis Date  ?????? GERD (gastroesophageal reflux disease)  ?????? Huntington disease (CMS HCC)  ?????? 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 Overactive bladder    Prescriptions Prior to Admission  Medication Sig Dispense Refill Last Dose  ?????? acetaminophen (TYLENOL) 500 mg tablet Take 325 mg by mouth every 4 hours   as  needed for Pain.  ?????? bisacodyl (DULCOLAX) 5 mg EC tablet Take 5 mg by mouth daily as needed for  Constipation.  ?????? CALCIUM ACETATE PO Take  by mouth.  ?????? Cholecalciferol, Vitamin D3, 1,000 unit Capsule Take 1,000 Units by  mouth  daily.  ?????? Clonazepam (KLONOPIN) 0.25 mg disintegrating tablet Take 0.25 mg by mouth   2  times daily.  ?????? clonazePAM (KLONOPIN) 0.5 mg tablet Take 0.5 mg by mouth as needed.  ?????? clonazePAM (KLONOPIN) 1 mg tablet Take 1 mg by mouth nightly at bedtime.  ?????? FA/MV,CA,IRON,MIN/LYCOPENE/LUT (MULTIVITAL PO) Take  by mouth.  ?????? fluticasone (FLONASE) 50 mcg/actuation nasal spray Spray 1 Spray into nose  daily.  ?????? mirabegron (MYRBETRIQ) 50 mg Tablet Sustained Release 24 hr Take 50 mg by  mouth daily.  ?????? nitroglycerin (NITROSTAT) 0.4 mg SL tablet Place 0.4 mg under tongue every   5  minutes as needed for Chest pain.  ?????? omeprazole (PRILOSEC) 20 mg capsule Take 40 mg by mouth daily.  ?????? QUEtiapine (SEROQUEL) 50 mg Tablet Take 50 mg by mouth 3 times daily.  ?????? risperiDONE (RISPERDAL) 1 mg Tablet Take 1 mg by mouth daily.  ?????? risperiDONE (RISPERDAL) 3 mg Tablet Take 3 mg by mouth nightly at bedtime.  ?????? traZODone (DESYREL) 100 mg tablet Take 100 mg by mouth nightly at bedtime.  ?????? ziprasidone (GEODON) 20 mg/mL (final conc.) Recon Soln 10 mg by   Intramuscular  route every 4 hours as needed.    No past surgical history on file.  Drug and alcohol abuse history reviewed: No changes other than what's noted  above    ROS:  Comprehensive review of system is negative other  than what is listed  above.  Blood pressure 126/94, pulse (!) 106, temperature 98.3 ??????F (36.8 ??????C),   temperature  source Oral, resp. rate 18, SpO2 96 %.    Current Facility-Administered Medications  Medication Dose Route Frequency Provider Last Rate Last Dose  ?????? acetaminophen (TYLENOL) tablet 650 mg  650 mg Oral Q4H PRN Nile Riggs., MD  ?????? alum - mag hydroxide-simeth (MINTOX MAX) 400-400-40 mg/5 mL suspension 15   mL  15 mL Oral Q4H PRN Anna Genre T., MD  ?????? benztropine (COGENTIN) tablet 1 mg  1 mg Oral Q2H PRN Nile Riggs., MD  ?????? bisacodyl (DULCOLAX) EC tablet 5-10 mg  5-10 mg Oral Daily PRN Nile Riggs., MD  ??????  Cholecalciferol (Vitamin D3) Tab 1,000 Units  1 Tab Oral Daily Anna Genre  T., MD   1,000 Units at 10/02/16 0757  ?????? clonazePAM (KLONOPIN) tablet 0.25 mg  0.25 mg Oral BID Anna Genre T., MD  0.25 mg at 10/02/16 0757  ?????? clonazePAM (KLONOPIN) tablet 1 mg  1 mg Oral QHS Anna Genre T., MD   1   mg at  10/01/16 2027  ?????? fluticasone (FLONASE) nasal spray 1 Spray  1 Spray Nasal Daily Anna Genre  T., MD   1 Spray at 10/02/16 [phone]  ?????? LORazepam (ATIVAN) injection *Pyxis Override*  ?????? LORazepam (ATIVAN) injection 2 mg  2 mg Intramuscular Q3H PRN Nile Riggs.,  MD  ?????? LORazepam (ATIVAN) tablet 2 mg  2 mg Oral Q3H PRN Nile Riggs., MD  ?????? midazolam (VERSED) injection *Pyxis Override*  ?????? mirabegron (Myrbetriq) sustained release tab 50 mg  50 mg Oral Daily Anna Genre T., MD   50 mg at 10/02/16 0757  ?????? multivitamin-TX-iron-minerals (THERA-M) tablet 1 Tab  1 Tab Oral Daily   Anna Genre T., MD   1 Tab at 10/02/16 0757  ?????? nitroglycerin (NITROSTAT) SL tablet 0.4 mg  0.4 mg Sublingual Q5 Min PRN   Anna Genre T., MD  ?????? OLANZapine [facility]) tablet 2.5 mg  2.5 mg Oral QHS Kotwal, Renu, MD   2.5   mg  at 10/01/16 2027  ?????? olanzapine zydis (ZYPREXA) disintegrating tablet 15 mg  15 mg Oral Q8H PRN  Anna Genre T., MD  ?????? pantoprazole (PROTONIX) tablet 40 mg  40 mg Oral Daily Anna Genre T., MD  40 mg at 10/02/16 0757  ?????? risperiDONE (RISPERDAL) tablet 1 mg  1 mg Oral Daily Anna Genre T., MD    1 mg  at 10/02/16 0757  ?????? risperiDONE (RISPERDAL) tablet 3 mg  3 mg Oral QHS Anna Genre T., MD   3   mg  at 10/01/16 2027  ?????? temazepam (RESTORIL) capsule 30 mg  30 mg Oral QPM PRN Nile Riggs., MD  ?????? ziprasidone (GEODON) injection 20 mg  20 mg Intramuscular Q12H PRN Nile Riggs., MD    Denies any side effects.  MSE:  Appearance.  Middle aged 6 female, looks older than stated age, has  facial tic like movements, seems un comfortable physicaly, several motor tics,  Dressed up with  fair hygiene.  Behavior:  cooperative  Attention span: fair.  Concentration: fair.  Language: intact.  Musculoskeletal : gait not steady, muscle tone increased In some muscle   groups;  choreic movements /tremors resent  Fund of knowledge: average.  Alert and oriented to time place and person.  Mood:  dysphoric  Affect:  mood congruent  Speech: nl in rate tone and vol  Thought process: organized.  Thought content: no paranoia , no  auditory, visual hallucinations.  Thought association: goal directed.  SHI: none, contracts for safety.  Memory: fair  I/J.: limited.    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  Start zyprexa to target moods and paranoia  Lower rispwrdal to rduce any possible EPS, dysphagia sec to that-  Will  add low dose cogentin only for brief period will monitor response.  Monitor sx  Rationale for medications, benefits and S/E addressed.  Educated pt. about the illness, risk benefit and side effects  of meds.  Pt. feels comfortable with the plan and does not have any objections.

## 2016-10-03 NOTE — Other (Unsigned)
C/C-paranoia, agitation, anxiety    Danielle Lawrence was seen, interim history reviewed, chart reviewed- labs reviewed.    Symptoms: She reports that she needs physiotherapy.  Reports that the PT helps  her.  Patient keeps pacing around in the lobby area and wants me to look at   her  gait.  It seems to be a little better than yesterday.  She reports that she is  eating and sometimes has difficulty swallowing.  Staff reports that she is  eating better and been the beginning when she starts to eat she tends to gorge  on the food fast  however when directed to slow down she is able to do that.  When she eats at a slow pace she does not experience choking staff reports.    She  is on a pur??????ed diet.  Reports compliance with medications.  The medications   were  adjusted and changed some she is now on low-dose of Zyprexa instead of the  Seroquel and is tolerating the change well-- she slept well last night as per  staff reports.  Her sister was there after flying monitoring her and her  husbandalso came for part of the day.  Severity:  significant.  Length of time:  few weeks.  Timing : all day long.  Context :  Underlying Huntigton's disease.  Mod Fact:  Comorbid anxiety, cognitive issues  Quality: substantial impairment in quality of life.    Compliant with medications and denies any SE.  Past psychiatric history reviewed.  No changes other than what's noted above  MEDICAL HISTORY:  Allergies  Allergen Reactions  ?????? Bactrim [Sulfamethoxazole-Trimethoprim]  ?????? Erythromycin  ?????? Methylphenidate  ?????? Sulfa (Sulfonamide Antibiotics)  ?????? Zithromax [Azithromycin]    Past Medical History:  Diagnosis Date  ?????? GERD (gastroesophageal reflux disease)  ?????? Huntington disease (CMS HCC)  ?????? 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 Overactive bladder   No past surgical history on file.  Drug and alcohol abuse history reviewed: No changes other than what's noted  above    ROS:   Comprehensive review of system is negative other than what is listed  above.  Blood pressure 130/91, pulse (!) 101, temperature 98.3 ??????F (36.8 ??????C),   temperature  source Oral, resp. rate 18, SpO2 99 %.    Current Facility-Administered Medications  Medication Dose Route Frequency Provider Last Rate Last Dose  ?????? acetaminophen (TYLENOL) tablet 650 mg  650 mg Oral Q4H PRN Kimberlee Nearing., MD  ?????? alum - mag hydroxide-simeth (MINTOX MAX) 400-400-40 mg/5 mL suspension 15   mL  15 mL Oral Q4H PRN Eber Hong T., MD  ?????? benztropine (COGENTIN) tablet 0.5 mg  0.5 mg Oral BID Kotwal, Renu, MD     0.5  mg at 10/03/16 0800  ?????? benztropine (COGENTIN) tablet 1 mg  1 mg Oral Q2H PRN Kimberlee Nearing., MD  ?????? bisacodyl (DULCOLAX) EC tablet 5-10 mg  5-10 mg Oral Daily PRN Kimberlee Nearing., MD  ?????? Cholecalciferol (Vitamin D3) Tab 1,000 Units  1 Tab Oral Daily Eber Hong  T., MD   1,000 Units at 10/03/16 0805  ?????? clonazePAM (KLONOPIN) tablet 0.25 mg  0.25 mg Oral BID Eber Hong T., MD  0.25 mg at 10/03/16 0800  ?????? clonazePAM (KLONOPIN) tablet 1 mg  1 mg Oral QHS Eber Hong T., MD   1   mg at  10/02/16 2057  ??????  fluticasone (FLONASE) nasal spray 1 Spray  1 Spray Nasal Daily Eber Hong  T., MD   1 Spray at 10/03/16 0801  ?????? LORazepam (ATIVAN) injection *Pyxis Override*  ?????? LORazepam (ATIVAN) injection 2 mg  2 mg Intramuscular Q3H PRN Kimberlee Nearing.,  MD  ?????? LORazepam (ATIVAN) tablet 2 mg  2 mg Oral Q3H PRN Kimberlee Nearing., MD  ?????? midazolam (VERSED) injection *Pyxis Override*  ?????? mirabegron (Myrbetriq) sustained release tab 50 mg  50 mg Oral Daily Eber Hong T., MD   50 mg at 10/03/16 0759  ?????? multivitamin-TX-iron-minerals (THERA-M) tablet 1 Tab  1 Tab Oral Daily   Eber Hong T., MD   1 Tab at 10/03/16 0759  ?????? nitroglycerin (NITROSTAT) SL tablet 0.4 mg  0.4 mg Sublingual Q5 Min PRN   Eber Hong T., MD  ?????? OLANZapine [facility]) tablet 2.5 mg  2.5 mg Oral QHS Kotwal, Renu, MD   2.5   mg  at  10/02/16 2047  ?????? olanzapine zydis (ZYPREXA) disintegrating tablet 15 mg  15 mg Oral Q8H PRN  Eber Hong T., MD  ?????? pantoprazole (PROTONIX) tablet 40 mg  40 mg Oral Daily Eber Hong T., MD  40 mg at 10/03/16 1610  ?????? risperiDONE (RISPERDAL) tablet 1 mg  1 mg Oral BID Johnnette Barrios, MD   1 mg   at  10/03/16 0759  ?????? risperiDONE (RISPERDAL) tablet 1 mg  1 mg Oral Daily Eber Hong T., MD    1 mg  at 10/02/16 0757  ?????? temazepam (RESTORIL) capsule 30 mg  30 mg Oral QPM PRN Kimberlee Nearing., MD  ?????? ziprasidone (GEODON) injection 20 mg  20 mg Intramuscular Q12H PRN Kimberlee Nearing., MD    Denies any side effects.  MSE:  Appearance.  Middle aged caucasian female, looks older than stated age, has  facial tic like movements, seems un comfortable physicaly, several motor tics,  Dressed up with fair hygiene.  Behavior:  cooperative  Attention span: fair.  Concentration: fair.  Language: intact.  Musculoskeletal : gait not steady, muscle tone increased In some muscle   groups;  choreic movements /tremors resent  Fund of knowledge: average.  Alert and oriented to time place and person.  Mood:  dysphoric  Affect:  mood congruent  Speech: nl in rate tone and vol  Thought process: organized.  Thought content: no paranoia , no  auditory, visual hallucinations.  Thought association: goal directed.  SHI: none, contracts for safety.  Memory: fair  I/J.: limited.    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  Continue zyprexa to target moods and paranoia  Lower risperdal further to reduce any possible EPS, dysphagia sec to that-  Will  add low dose cogentin only for brief period will monitor  response.  Monitor sx  Rationale for medications, benefits and S/E addressed.  Educated pt. about the illness, risk benefit and side effects  of meds.  Pt. feels comfortable with the plan and does not have any objections.
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
## 2016-10-04 NOTE — Other (Unsigned)
Inpatient Speech Language Pathology Consult Note      Modified Barium Swallow Evaluation    Modified Barium Swallow Study completed. Pt is a 61 y.o. Caucasian  female presenting with agitation. PMhx includes huntington's and GERD. This  date, pt appearing alert and anxious stating, "I can't swallow" repeatedly.   Per  chart review, pt is on a soft diet with thickened liquids at [facility]. Recent   bedside  swallow assessment completed on 10/01/16 recommending dysphagia 3 with  nectar-thick liquids.    Oral mechanism showing generalized weakness with reduced ROM and speed. Thin  liquid, honey-thick liquid, nectar thickened liquid, puree, solid trails  presented.    Mildly prolonged mastication with adequate oral clearance and control across   all  consistencies. Diminished hyoid excursion, laryngeal elevation, and epiglottic  inversion. Discoordinated swallow with thin liquids by straw (difficult to  control bolus size by straw) resulting in delayed coughing with suspected  aspiration event (movements impeding camera view). Mild reduced pharyngeal  peristalsis resulting in mild vallecular residue post thicker consistencies.  Reduced UES opening with reduced esophageal peristalsis/constrictions.   Possible  esophageal backflow also noted. Unable to scan down, as pt impulsive and   unable  to stand still. No laryngeal penetration or subglottic aspiration observed   with  thin liquid by spoon, nectar/honey thick liquids by spoon, puree or solid  consistencies.    RECOMMENDATIONS  GI consult, esophagram  Dysphagia 2 with nectar-thick liquids  Okay to have thin liquid by spoon/cup in between meals  Fully upright and alert for all PO    Motor Speech  Labial: Impaired;Strength;Speed;Coordination;Spastic Barnie Del, [place])  Lingual: Impaired;Coordination;Strength;Speed;Spastic Barnie Del, [place])  Velum: No impairment Barnie Del, [place])  Mandible: Impaired;Strength;Coordination Barnie Del, [place])  Face: Upper facial weakness;Lower facial weakness  Barnie Del, [place])  Vocal Quality: No impairment Barnie Del, [place])    Trialed Consistencies  Liquid: Thin;Nectar;Honey Barnie Del, [place])  Paste with puree: x 2 Barnie Del, [place])  Cookie: x 1 Barnie Del, [place])    Results  Oral Stage: Impaired bolus formation Barnie Del, [place])  Reflex Initiation Stage: Normal Barnie Del, [place])  Pharyngeal Stage: Reduced laryngeal elevation;Impaired UES opening Barnie Del,  [place])    Recommendations  Oral Diet Level: Dysphagia 2 Barnie Del, [place])  Liquid Diet Level: Nectar Barnie Del, [place])  Swallow Strategies: Optimum positioning/upright;Sit upright 20 minutes after  p.o. intake;Administer meds crushed in food;Assist/supervision Barnie Del, [place])  Follow Up Treatments: Diet tolerance monitoring;Patient/Family Education   Barnie Del,  [place])          Therapist: Johnnette Litter  Date: 10/04/2016

## 2016-10-04 NOTE — Other (Unsigned)
Inpatient Physical Therapy Progress Note    Spoke with evaluating PT and pts Rn about pts PLOF and current LOF. RN reports  pt ambulating to/from room/lounge area frequently with staff (S)/SBA. Pt   appears  to be at her baseline at this time. Pt may benefit from PT services at her d/c  setting if there are environmental concerns or difficulty negotiating her  environment as well as for establishing a HEP.  D/c from acute PT services  No charge    Therapist: Marquette Saa, PT, DPT  Date: 10/04/2016
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
## 2016-10-05 NOTE — Other (Unsigned)
C/C-paranoia, agitation, anxiety, Huntington's disease    SHAHIDAH HANIF was seen, interim history reviewed, chart reviewed- labs reviewed.  Symptoms:Mrs. Morawski's was seen.  She had periods of restlessness agitation,  irritability and periods when she was screaming. She kept repeating things   like  she wanted to watch TV she did not want to sleep she had problems with return  etc. Part of the presentation had clear indicators of being a behavioral  phenomenon rather than repeat anything psychotic.  She would get agitated at   the  mention of the nursing home she has come from and reported repeatedly that she  did not want to go back there.  She has been getting pured food however   Kept  repeating that she has difficulty swallowing.  The nurse helped her provide   the  things that she needed.  She denied suicidal ideations, homicidal ideations,   or  hallucinations.  There is a certain level of paranoia especially around the  nursing home she came from.  The staff from [facility] met with  her and told her that they would call us back.  Severity:  significant.  Length of time:  few weeks.  Timing : all day long.  Context :  Underlying Huntigton's disease.  Mod Fact:  Comorbid anxiety, cognitive issues  Quality: substantial impairment in quality of life.    Compliant with medications and denies any SE.  Past psychiatric history reviewed.  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    ROS:  Comprehensive review of system is negative other than what  is listed  above.  Blood pressure 116/60, pulse (!) 102, temperature 98.4 F (36.9 C),   temperature  source Oral, resp. rate 17, SpO2 97 %.    Current Facility-Administered Medications  Medication Dose Route Frequency Provider Last Rate Last Dose   acetaminophen (TYLENOL) tablet 650 mg  650 mg Oral Q4H PRN Kimberlee Nearing., MD   alum - mag hydroxide-simeth (MINTOX MAX) 400-400-40 mg/5 mL suspension 15   mL  15 mL Oral Q4H PRN Eber Hong T., MD   benztropine (COGENTIN) tablet 0.5 mg  0.5 mg Oral BID Janey Genta, Renu, MD     0.5  mg at 10/05/16 0826   benztropine (COGENTIN) tablet 1 mg  1 mg Oral Q2H PRN Kimberlee Nearing., MD   bisacodyl (DULCOLAX) EC tablet 5-10 mg  5-10 mg Oral Daily PRN Kimberlee Nearing., MD   Cholecalciferol (Vitamin D3) Tab 1,000 Units  1 Tab Oral Daily Eber Hong  T., MD   1,000 Units at 10/05/16 0826   clonazePAM (KLONOPIN) tablet 0.25 mg  0.25 mg Oral BID Eber Hong T., MD  0.25 mg at 10/05/16 0827   clonazePAM (KLONOPIN) tablet 1 mg  1 mg Oral QHS Eber Hong T., MD   1   mg at  10/04/16 2051   divalproex (DEPAKOTE SPRINKLE) capsule 250 mg  250 mg Oral BID Johnnette Barrios,  MD   250 mg at 10/05/16  6962   esomeprazole (NEXIUM) capsule 40 mg  40 mg Oral QAM AC Kotwal, Renu, MD   fluticasone (FLONASE) nasal spray 1 Spray  1 Spray Nasal Daily Eber Hong  T., MD   1 Spray at 10/05/16 0825   LORazepam (ATIVAN) injection *Pyxis Override*   LORazepam (ATIVAN) injection 2 mg  2 mg Intramuscular Q3H PRN Kimberlee Nearing.,  MD   LORazepam (ATIVAN) tablet 2 mg  2 mg Oral Q3H PRN Kimberlee Nearing., MD   midazolam (VERSED) injection *Pyxis Override*   mirabegron (Myrbetriq) sustained release tab 50 mg  50 mg Oral Daily Eber Hong T., MD   50 mg at 10/05/16 0827   multivitamin-TX-iron-minerals (THERA-M) tablet 1 Tab  1 Tab Oral Daily   Eber Hong T., MD   1 Tab at 10/04/16 9528   nitroglycerin (NITROSTAT) SL tablet 0.4 mg  0.4 mg  Sublingual Q5 Min PRN   Eber Hong T., MD   OLANZapine [facility]) injection 5 mg  5 mg Intramuscular Once Kotwal, Renu,   MD   OLANZapine (ZYPREXA) tablet 5 mg  5 mg Oral QHS Kotwal, Renu, MD   5 mg at  10/04/16 2052   ziprasidone (GEODON) injection 20 mg  20 mg Intramuscular Q12H PRN Kimberlee Nearing., MD    Denies any side effects.  MSE:  Appearance.  Middle aged caucasian female, looks older than stated age, has  facial tic like movements, seems uncomfortable physicaly,Dressed up with fair  hygiene.  Behavior: irritable, gets agitated, increased PMA, combined effect of the  Huntington's disease as well as her agitation.  Significant paranoid themes.  Attention span: fair.  Concentration: fair.  Language: intact.  Musculoskeletal : gait not steady, muscle tone increased In some muscle   groups;  choreic movements /tremors present  [employer] of knowledge: average.  Alert and oriented to time place and person.  Repeats several times that she  does not know what date it is or her memory is not good however is able to   tell  me the date.  Mood:  dysphoric -upset  Affect:  Constricted, labile, intense, mood congruent  Speech: Slow to stilted in rate, nl tone and vol  Thought process: organized.  Concrete  Thought content: Reports paranoia , paranoid themes around staff at the recent  nursing home.  Not feeling safe to return there.  Delusions involving the   staff  putting poop in her food.  no  auditory, visual hallucinations.  Thought association: Coherent.  SHI: denies today. Denies HI  Memory: limited  I/J.: limited.    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  Continue zyprexa 5 mg by mouth daily at bedtime to target mood and psychosis.  Lower risperdal further to reduce any possible EPS, dysphagia sec to it.  continue low dose cogentin only for brief period will monitor response.  Monitor sx.  Neurology consult appreciated.  Educated pt. about the illness, risk benefit and side effects  of meds.  Social worker to assist with getting transferred to [facility] unit, if  possible

## 2016-10-05 NOTE — Other (Unsigned)
------------------------------------------------------------------------------  --  Attestation signed by Florestine Avers., DO at 10/05/16 [phone]  Attending attestation:  I have personally performed a face to face diagnostic evaluation on this  patient, and I agree with note above as documented by nurse practitioner and  edits to history, physical exam, and assessment/plan as made.    My examination demonstrated the above findings, and most notably was with  chorea in head, neck, trunk, and extremities as well as a disorientation to  person, place, and time with some tangential and disorganized thoughts as well  as fixations of thought. She appears somewhat lethargic and somnolent.    Assessment:  1. Huntington's disease  2. Dementia  3. Psychosis    Danielle Lawrence is a 61 year old woman presenting with Huntington's disease and   progressive  paranoia and anxiety.    Recommendation:  1. Would advise the primary team to avoid neuroleptics as able given tardive  risks and parkinsonian risks which may be temporary or permanent. If  neuroleptics must be used, I'd recommend refraining from use of multiple   agents  (ziprasidone and olanzapine), and selecting agents with less dopamine blockade  (e.g. clozapine), if clozapine is used must check CBC weekly and ensure close  follow up with a physician able to prescribe as outpatient to ensure no  agranulocytosis.  2. Recommend team halve dose of benztropine and consider it's discontinuation  given that it may exacerbate sedation and confusion.  3. Recommend team refrain from excessive use of benzodiazepines (ativan,  clonazepam, and versed) as they may exacerbate sedation and so confusion.  4. I recommend pursuit of nonpharmacologic evidence-based delirium precautions  include the following: frequently reorient, shades up during day/down at   night,  TV off except for soft music only, have familiar objects at bedside, encourage  friends/family to spend the night, minimize  anticholingeric medications,  minimize polypharmacy, minimize restraints (includes lines and/or foleys   and/or  feeding tubes as able) and minimize overnight checks/vitals to encourage  restful consistent sleep patterns.  5. [name] will need close follow up as outpatient with her Neurologist Dr. Patrecia Pour at [facility].    Please see the assessment and plan in attached note for complete details. Your  medical management, please call back if any abnormalities, any questions, or  any concerns.      Izora Ribas, D.O.  [facility]  Phone: [phone]    Total Time performing history, physical examination, and consultation was at  least 80 minutes with 50% of the time spent counseling. A copy of this note   was  made available for the attending physician.        ------------------------------------------------------------------------------  --    Neurology Consultation Note    Patient: Danielle Lawrence MRN: [id number]  Date of Birth: [birth year]  Age: 61 y.o.  Sex: female  Unit: [facility] Room/Bed: 6061/1 Location: THE [facility]  SERVICE AREA    Date of Consultation: 10/05/2016  Date of Admission: 09/30/2016 12:14 PM ( LOS: 4 days )  Admitting Physician: Eber Hong T.  Primary Care Physician: Dannielle Burn., MD  Consult Requested By: Johnnette Barrios, MD    Reason for Consult: "pt with Huntingtons disease-paranoia"    ASSESSMENT & PLAN    [name] is a 61 year old lady with Huntington's Disease who presents with  paranoia, anxiety, and reportedly dysphagia.    MBS unrevealing and tolerating pureed diet without signs of aspiration. Will  discuss  medication regimen and possible additions or alternatives with   movement  disorder specialist Dr. Izora Ribas, upon his arrival this afternoon.   Thank  you for the consult.    1) Huntington's Disease  2) Dysphagia  3) Paranoia  4) Anxiety    Discussed at length with patient and Dr. Yevonne Aline.  Please do not hesitate to call if any concerns, questions,  or change in   patient  condition.    SUBJECTIVE    Chief Complaint:  "I need a neurologist. I can't swallow"    History of Present Illness:  Danielle Lawrence is a 61 y.o. lady with a past medical history that includes  Huntington's Disease, who I am asked to see for agitation and paranoia.   History  of present illness is obtained by chart review as patient is unable to  contribute to HPI.    Per chart review: she was diagnosed with Huntington's in 2012. She was   admitted  to the psychiatric unit on 10/01/16 from [facility] on an  involuntary basis given increasing paranoia and agitation. Her paranoia was  insidious in onset. It has been continuous and progressively worsening over   the  last three years (per office note, she has been convinced that her nursing   home  aids are drugging her) but over the last several weeks it has been more severe  than usual. Associated with this, she has had insomnia and anxiety. Apparently  in the ER this all improved (transiently) with haldol. Per chart review she   has  not tolerated antidepressants in the past. She denies any suicidal or   homicidal  ideations. She denies any hallucinations. Her husband reports that the nursing  facility is a nice place and they take good care of her.    On my visit: she is preoccupied with dysphagia which she reports has been  continuously worsening since a month ago, but reportedly was on a pureed diet   as  of November 2017. Despite reported dysphagia she is spooning a pureed liquid  into her mouth and tolerating it well without any apparent aspiration. She  reports memory problems. She reports no other issues and when questioned  outright denies paranoia or anxiety. She reports she has memory problems but  likes to watch baseball and participate in mind-stimulating activities.    She has a family history of Huntington's in her Mother and Alzheimer's Disease  in her Father. She follows with [facility] (Dr. Patrecia Pour). She sees  Dr.  Vivi Ferns for outpatient psychiatry.    MoCA: 12/30 (Criterion >26)  - PER OT Note 10/02/16  Visuospatial/Executive: 0/5 (unable to complete trail making, copying letters  and numbers instead; unable to copy cube accurately; clock with poor contour,   no  numbers, and hands in incorrect position)  Naming: 2/3 (hippo)  Attention: 3/6 (taps hands at inappropriate letters; able to do 1 subtraction)  Language 0/3 (unable to repeat sentences; names 6 words beginning with F)  Abstraction: 1/2 (unable to compare watch and ruler)  Delayed Recall 2/5 (recalls face and velvet)  Orientation: 4/6 (not oriented to date/day but aware that Odis Luster is near)    Past Medical History:  Patient Active Problem List  Diagnosis  ?????? Agitation requiring sedation protocol    Past Surgical History:  Sinus surgery  Tonsillectomy    Family History:  Huntington's Disease in her mother, maternal uncle,  Alzheimer Disease in her father  Suicidality and  depression in her sister    Social History:  Social History  ?????? Marital status: Married    Spouse name: N/A  ?????? Number of children: N/A  ?????? Years of education: N/A    Social History Main Topics  ?????? Smoking status: Never Smoker  ?????? Smokeless tobacco: Never Used  ?????? Alcohol use No  ?????? Drug use: No  ?????? Sexual activity: Not on file    Medications:  Allergen  ?????? Bactrim [Sulfamethoxazole-Trimethoprim]  ?????? Erythromycin  ?????? Methylphenidate  ?????? Sulfa (Sulfonamide Antibiotics)  ?????? Zithromax [Azithromycin]    Prescriptions Prior to Admission  Medication Sig  ?????? acetaminophen (TYLENOL) 500 mg tablet Take 325 mg by mouth every 4 hours   as  needed for Pain.  ?????? bisacodyl (DULCOLAX) 5 mg EC tablet Take 5 mg by mouth daily as needed for  Constipation.  ?????? CALCIUM ACETATE PO Take  by mouth.  ?????? Cholecalciferol, Vitamin D3, 1,000 unit Capsule Take 1,000 Units by mouth  daily.  ?????? Clonazepam (KLONOPIN) 0.25 mg disintegrating tablet Take 0.25 mg by mouth   2  times daily.  ?????? clonazePAM  (KLONOPIN) 0.5 mg tablet Take 0.5 mg by mouth as needed.  ?????? clonazePAM (KLONOPIN) 1 mg tablet Take 1 mg by mouth nightly at bedtime.  ?????? FA/MV,CA,IRON,MIN/LYCOPENE/LUT (MULTIVITAL PO) Take  by mouth.  ?????? fluticasone (FLONASE) 50 mcg/actuation nasal spray Spray 1 Spray into nose  daily.  ?????? mirabegron (MYRBETRIQ) 50 mg Tablet Sustained Release 24 hr Take 50 mg by  mouth daily.  ?????? nitroglycerin (NITROSTAT) 0.4 mg SL tablet Place 0.4 mg under tongue every   5  minutes as needed for Chest pain.  ?????? omeprazole (PRILOSEC) 20 mg capsule Take 40 mg by mouth daily.  ?????? QUEtiapine (SEROQUEL) 50 mg Tablet Take 50 mg by mouth 3 times daily.  ?????? risperiDONE (RISPERDAL) 1 mg Tablet Take 1 mg by mouth daily.  ?????? risperiDONE (RISPERDAL) 3 mg Tablet Take 3 mg by mouth nightly at bedtime.  ?????? traZODone (DESYREL) 100 mg tablet Take 100 mg by mouth nightly at bedtime.  ?????? ziprasidone (GEODON) 20 mg/mL (final conc.) Recon Soln 10 mg by   Intramuscular  route every 4 hours as needed.    Review of Systems:  Unable to assess given tangential speech    OBJECTIVE    Vitals:  Patient Vitals for the past 36 hrs:   BP Temp Temp src Pulse Resp SpO2  10/05/16 0712 116/60 98.4 ??????F (36.9 ??????C) Oral (!) 102 17 97 %  10/04/16 1958 123/53 97.4 ??????F (36.3 ??????C) Axillary 92 17 97 %    Neurological Exam (performed on 10/05/2016 at 11:40):  Constitutional   Vital signs: BP, HR, and RR reviewed  General Alert, pacing, well-nourished  Eyes: unable to visualize fundi  Cardiovascular: pulses symmetric in all 4 extremities.  No peripheral edema.  Psychiatric: cooperative with examination, no  psychotic behavior noted.  Neurologic  Mental status: Alert  orientation to person, place, month, and year   General fund of knowledge she is able to name the current president   Memory reports memory problems but too tangential to test   Attention requires frequent cues and redirection to participate in exam   Language fluent in conversation but content is  perseverative, no apparent  aphasia   Comprehension intact; follows simple commands and 2 step commands crossing  midline  Cranial nerves:  CN2: blinks to threat bialterally  CN 3,4,6: extraocular muscles intact by tracking  CN5: facial sensation  symmetric  CN7: face symmetric without dysarthria  CN8: hearing symmetric to finger rub  CN9: palate elevated symmetrically  CN11: trap weak bilaterally  CN12: tongue midline with protrusion  Strength: no prontator drift. Moving all limb symmetrically and fully. Facial  tics prominent.  Deep tendon reflexes: unable to test given limited attention  Sensory: light touch intact and symmetric in all 4 extremities.  Unable to   test  extinction given limited attention  Cerebellar/coordination: finger nose finger normal without ataxia  Tone: normal in all 4 extremities.  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  Creatinine 0.78 mg/dL  WBC 1.6X  TSH 0.96 uIU/mL  Toxicology + benzos on admission    Scheduled Meds:  benztropine 0.5 mg Oral BID  Cholecalciferol (Vitamin D3) 1 Tab Oral Daily  clonazePAM 0.25 mg Oral BID  clonazePAM 1 mg Oral QHS  divalproex 250 mg Oral BID  esomeprazole 40 mg Oral QAM AC  fluticasone 1 Spray Nasal Daily  LORazepam  midazolam  mirabegron 50 mg Oral Daily  multivitamin-TX-iron-minerals 1 Tab Oral Daily  OLANZapine  OLANZapine 5 mg Intramuscular Once  OLANZapine 5 mg Oral QHS    PRN Meds:  acetaminophen 650 mg Oral Q4H PRN  alum - mag hydroxide-simeth 15 mL Oral Q4H PRN  benztropine 1 mg Oral Q2H PRN  bisacodyl 5-10 mg Oral Daily PRN  LORazepam 2 mg Intramuscular Q3H PRN  LORazepam 2 mg Oral Q3H PRN  nitroglycerin 0.4 mg Sublingual Q5 Min PRN  ziprasidone 20 mg Intramuscular Q12H PRN        Geralynn Ochs, NP  Neurology  October 05, 2016  [phone].  If it is after 6 pm or the weekend, please call [phone] and speak with on  call neurologist.

## 2016-10-05 NOTE — Other (Unsigned)
Inpatient Occupational Therapy Progress Note      S: "I need therapy so I can swallow and I need tongue exercises. I really want  to watch baseball."    O:  Precautions  Activity Level: Activity as tolerated  Weight Bearing Status: Full  Joint Precautions: None  Isolation: None  Cardiac: None  Other Precautions: admitted with agitation and delerium, comorbids: falls,  Huntington's Disease    Cognition  Overall Cognitive Status: Impaired  Arousal/Alertness: Fluctuating alertness;Agitated  Behavior: Distracted;Impulsive  Attention Span: Difficulty attending to directions;Attends with cues to  redirect;Difficulty dividing attention  Orientation: Person;Place  Following Commands: Difficulty following one step commands (needed verbal and  physical redirections to attend to task )  Initiation/Sequencing/Organization: Cues to initiate task;Difficult sequencing  Safety Judgement: Decreased insight of deficits;Decreased safety  awareness;Decreased awareness of assistance need  Problem Solving: Assistance req'd to generate solutions;Assistance req'd to  identify errors    Bed Mobility  Sit to Stand: CGA        ADL  Where Assessed: Bathroom  Grooming Assistance: Minimal (set-up, steadying, cues to sequence )        Hand Function  Gross Grasp: Impaired  Hand Coordination: Other (comment) (pt has huntingtons disease, impaired   ability  to write/FM)    Interventions/Exercises  Shoulder AROM/Strengthening: Flexion;Abduction;Repititions 10  Elbow AROM/Strengthening: Flexion;Extension;Repetitions 10  Hand strengthening: Tendon Gliding;Exercise Web (opposition thumb/fingers 10x)      A: Pt with significant difficulty attending and perseverated throughout    session  about needed therapy, getting help swallowing and needing arm and tongue  exercises. Pt needed constant verbal and physical cues to attend to therapy.   Pt  improved concentration with counting during exercises and having visual  demonstrations. Increased physical cuing  for ADLS and functional transfers  during session as a Ceejay Kegley of pt disorganized behavior. Pt deficits in  attention, sequencing organization and fluctuating assistance qualifies pt to  need a long term placement upon discharge.    Goals to be met by 10/05/16:  1.  Pt will initiate ADL completion with <2 cues. - NOT MET  2.  Pt will sequence bathing task with <25% cues. - N/A  Goal added 10/05/16:  3. Pt will complete UE HEP for improved strength for functional mobility.    P:  Pt to be seen 2x/week to address ADL completion and cognition for ADLs.  Recommend long-term placement at discharge.    Time: 18 minutes  Charge 1 ADL    Therapist: Rogers Blocker. S/OT  Date: 10/05/2016

## 2016-10-06 NOTE — Other (Unsigned)
------------------------------------------------------------------------------  --    Attestation signed by Florestine Avers., DO at 10/06/16 1315  Chart reviewed, events noted. I have personally discussed patient's clinical  condition with NP and reviewed the note and confirmed the documented findings  via my own chart review. I agree with the NP assessment and plan as   documented.      Danielle Lawrence, D.O.  [facility]  Phone: [phone]  10/06/2016    A copy of this note was made available for the attending physician.         ------------------------------------------------------------------------------  --    Neurology Progress Note    Updates:  Denies anxiety, paranoia  Eager for rehabilitation and cognitive stimulation  Sitter at her side reports she's doing well, asking to eat [place] cookies  repeatedly  Planning to DC today or tomorrow    Exam  Blood pressure 116/73, pulse (!) 107, temperature 98.2 ??????F (36.8 ??????C),   temperature  source Oral, resp. rate 18, SpO2 99 %.  -Mental status: A&O x3; pleasant & inappropriate at times. Fixated on cookies  and swallowing.  -Speech & Language: no aphasia; no dysarthria  -Cranial nerves: pupils symmetric; no notable dysconjugate gaze; no facial  asymmetry; hearing grossly intact bilaterally  -Motor: moves all extremities symmetrically and fully  -Other: chorea noted in the head, neck, trunk, and extremities  Other Systems  -General Appearance: well-developed, well-nourished, no apparent distress  -Neck: supple    No interval labs or diagnostic testing to review    Impression:  Danielle Lawrence is a 61 y.o. female with Huntington's Disease who presented with  progressive paranoia and anxiety. Doing a bit better today. Going to be  discharged 11 AM tomorrow to [facility]    Recommendations:  No new neurologic recommendations.  Will need follow up with [facility] as outpatient  Will sign off. Please call if questions/concerns or change in  patient's  neurologic exam.    Geralynn Ochs, NP  Neurology  October 06, 2016  [phone]  [phone] to reach covering neurologist (please use this especially for  nights and weekends)

## 2016-10-06 NOTE — Other (Unsigned)
C/C-paranoia, agitation, anxiety, Huntington's disease    MERINDA Lawrence was seen, interim history reviewed, chart reviewed- labs reviewed.  Symptoms:[name]'s was seen.  She reports that she is doing okay..  Reports  that she does not like to sleep.  Complains that she s given a pill and that  made her sleep last night.  Reports that her appetite is okay but she cannot  swallow because of Problems.  Reported That She Did Eat Her Breakfast.  She  Denied Experiencing Hallucinations.  Denied Paranoia Here and Denied SI, and  Denied HI.  She Is Aware That She's Been Accepted at an [facility] and Reports That She Would like to Go There and Get therapy. She   a  few times tried to again interrupt the session with some repetitive phrases  however was more redirectable.  Severity:  Somewhat decreased from yesterday.  Length of time:  few weeks.  Timing : all day long.  Context :  Underlying Huntigton's disease.  Mod Fact:  Comorbid anxiety, cognitive issues  Quality: substantial impairment in quality of life.    Compliant with medications and denies any SE.  Past psychiatric history reviewed.  No changes other than what's noted above  MEDICAL HISTORY:  Allergies  Allergen Reactions  ?????? Bactrim [Sulfamethoxazole-Trimethoprim]  ?????? Erythromycin  ?????? Methylphenidate  ?????? Sulfa (Sulfonamide Antibiotics)  ?????? Zithromax [Azithromycin]    Past Medical History:  Diagnosis Date  ?????? GERD (gastroesophageal reflux disease)  ?????? Huntington disease (CMS HCC)  ?????? 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 Overactive bladder   No past surgical history on file.    ROS:  Comprehensive review of system is negative other than what is listed  above.  Blood pressure 116/73, pulse (!) 107, temperature 98.2 ??????F (36.8 ??????C),   temperature  source Oral, resp. rate 18, SpO2 99 %.    Current Facility-Administered Medications  Medication Dose Route Frequency Provider Last Rate Last  Dose  ?????? acetaminophen (TYLENOL) tablet 650 mg  650 mg Oral Q4H PRN Kimberlee Nearing., MD  ?????? alum - mag hydroxide-simeth (MINTOX MAX) 400-400-40 mg/5 mL suspension 15   mL  15 mL Oral Q4H PRN Eber Hong T., MD  ?????? benztropine (COGENTIN) tablet 1 mg  1 mg Oral Q2H PRN Kimberlee Nearing., MD  ?????? bisacodyl (DULCOLAX) EC tablet 5-10 mg  5-10 mg Oral Daily PRN Kimberlee Nearing., MD  ?????? Cholecalciferol (Vitamin D3) Tab 1,000 Units  1 Tab Oral Daily Eber Hong  T., MD   1,000 Units at 10/05/16 0826  ?????? clonazePAM (KLONOPIN) tablet 0.25 mg  0.25 mg Oral BID Eber Hong T., MD  0.25 mg at 10/05/16 1951  ?????? clonazePAM (KLONOPIN) tablet 1 mg  1 mg Oral QHS Eber Hong T., MD   1   mg at  10/05/16 1950  ?????? divalproex (DEPAKOTE SPRINKLE) capsule 250 mg  250 mg Oral BID Johnnette Barrios,  MD   250 mg at 10/05/16 1950  ?????? esomeprazole (NEXIUM) capsule 40 mg  40 mg Oral QAM AC Kotwal, Renu, MD     40  mg at 10/06/16 0547  ?????? fluticasone (FLONASE) nasal spray 1 Spray  1 Spray Nasal Daily Eber Hong  T., MD   1 Spray at 10/05/16 0825  ?????? LORazepam (ATIVAN) injection *Pyxis Override*  ?????? LORazepam (ATIVAN) injection 2 mg  2 mg  Intramuscular Q3H PRN Eber Hong   T.,  MD   2 mg at 10/06/16 0731  ?????? LORazepam (ATIVAN) tablet 2 mg  2 mg Oral Q3H PRN Kimberlee Nearing., MD  ?????? midazolam (VERSED) injection *Pyxis Override*  ?????? mirabegron (Myrbetriq) sustained release tab 50 mg  50 mg Oral Daily Eber Hong T., MD   50 mg at 10/05/16 0827  ?????? multivitamin-TX-iron-minerals (THERA-M) tablet 1 Tab  1 Tab Oral Daily   Eber Hong T., MD   1 Tab at 10/04/16 [phone]  ?????? nitroglycerin (NITROSTAT) SL tablet 0.4 mg  0.4 mg Sublingual Q5 Min PRN   Eber Hong T., MD  ?????? OLANZapine (ZYPREXA) injection 5 mg  5 mg Intramuscular Once Kotwal, Renu,   MD  ?????? OLANZapine (ZYPREXA) tablet 5 mg  5 mg Oral QHS Kotwal, Renu, MD   5 mg at  10/05/16 1950  ?????? ziprasidone (GEODON) injection 20 mg  20 mg Intramuscular Q12H PRN  Kimberlee Nearing., MD    Denies any side effects.  MSE:  Appearance.  Middle aged caucasian female, looks older than stated age, has  facial tic like movements, seems uncomfortable physicaly, 6 her tongue out at  times,Dressed up with fair hygiene.  Behavior:  increased PMA, less agitated compared to yesterday.  Able to sit in  one place and answer questions, however gets distracted by some repetitious  phrases that she utters.  Attention span: limited.  Concentration: limited.  Language: intact.  Musculoskeletal : gait not steady, muscle tone increased In some muscle   groups;  choreic movements present  Fund of knowledge: average.  Alert and oriented to time place and person.  Still continues to Repeat   several  times that she does not know what date it is that her memory is not good  --however is able to tell the date.  Mood:  Okay  Affect:  Constricted, last labile, mood congruent  Speech: Slow to stilted in rate, nl tone and vol  Thought process: organized.  Concrete  Thought content: not talking about her paranoid themes as much today.  Denies  auditory or visual hallucinations.  Thought association: Coherent.  SHI: denies today. Denies HI  Memory: limited  I/J.: limited.    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  Continue zyprexa 5 mg by mouth daily at bedtime to target mood and psychosis.  Lower risperdal further to reduce any possible EPS, dysphagia sec to it.  continue low dose cogentin only for brief period will monitor response.  Monitor sx.  Neurology consult appreciated.  Patient has been accepted for admission to [facility] unit.  Social worker to assist with getting  transferred to [facility]  unit--scheduled for tomorrow.  Labs ordered.  No results found for this or any previous visit (from the past 72 hour(s)).

## 2016-10-07 NOTE — Other (Unsigned)
Discharge Summary.      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    DISCHARGE MEDICATIONS:     [name], [name]  Home Medication Instructions [id number]    Medication Information    acetaminophen (TYLENOL) 325 mg tablet  Take 2 Tabs by mouth every 6 hours as needed for Pain (mild pain).    alum - mag hydroxide-simeth (MINTOX MAX) 400-400-40 mg/5 mL Suspension  Take 15 mL by mouth every 4 hours as needed.    benztropine (COGENTIN) 1 mg tablet  Take 1 Tab by mouth every 2 hours as needed (For extrapyramidal effects, no   more  than 2  times in 24 hrs).    bisacodyl (DULCOLAX) 5 mg EC tablet  Take 5 mg by mouth daily as needed for Constipation.    Cholecalciferol, Vitamin D3, 1,000 unit Tablet  Take 1 Tab by mouth daily.    divalproex (DEPAKOTE SPRINKLE) 125 mg capsule, delayed rel sprinkle  Take 2 Caps by mouth 2 times daily.    fluticasone (FLONASE) 50 mcg/actuation nasal spray  1 spray into nostril daily    mirabegron (MYRBETRIQ) 50 mg Tablet Sustained Release 24 hr  Take 1 Tab by mouth daily.    Multivit-Iron-Min-Folic Acid 18-0.4 mg Tablet  Take 1 Tab by mouth daily.    nitroglycerin (NITROSTAT) 0.4 mg SL tablet  Place 1 Tab under tongue every 5 minutes as needed for Chest pain.    OLANZapine (ZYPREXA) 5 mg Tablet  Take 1 Tab by mouth nightly at bedtime.      DISCHARGE INSTRUCTIONS:  Aftercare  appointments:    Psychiatrist: Dr. Mcarthur Rossetti will see patient at [facility] within 14  days.    Substance use:  Patient has been abusing substances: Yes        No     If patient has been abusing substances, patient refused referral for   treatment.  Yes     Referral made to:  Date of Appointment:  Patient already active in treatment, with agency:   Appointment date:    Disposition: [facility]  Telephone: [phone]  Fax: [phone]    Tobacco Use:  Patient is a tobacco user:  Yes        No     If patient is a tobacco user, patient refused referral for tobacco cessation.  Yes         [place] [employer] or [phone]  TTY [phone]  Regular Hours  Mon Thurs 9:00 am to 11:00 pm, Fri 9:00 am to 9:00 pm,   Sat-Sun  10:00 am to 6:30 pm    Nicotine Anonymous  Every Saturday  At 12:00 pm in Classroom 7 on A-Level (2 hour meeting)  [facility]  [facility]  [address]  [place] [address]    Psychiatric Advance Directives:  Does the patient have an appointed surrogate decision maker? Yes X No  If NO, was the patient given information about Psychiatric Advance Directives?  Yes No  If NO, please document the reason:    Crisis Management/Safety Plan shared with her.    IDENTIFICATION DATA, CHIEF COMPLAINT, REASON FOR ADMISSION, HISTORY OF PRESENT  ILLNESS:    Patient is a middle-aged Caucasian female with Huntington's disease who was  admitted to the behavior unit at the [facility] with reports of having  increasing paranoia and agitation at the nursing home where she was residing.  Patient was admitted for possible med changes.  Patient reported feeling  paranoid that the people at the nursing home did not like her.  She reported  that at times she would not eat food because she felt people were putting open  her food she felt very distressed and agitated because of that.  As per   history  to symptoms have been ongoing for the past few years however  had worsened in   the  past few weeks.    Past psychiatric history significant for history of diagnosis of mood disorder  secondary to Huntington's disease.  History of poor tolerance to   antidepressant  medications.  No history of suicide attempts.      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  Pt's vitals sign and labs were reviewed.metabolic panel was within normal  limits.  Liver enzymes were within normal limits.  Urine toxicology was   positive  for benzodiazepine that she had been receiving.  UA was negative for nitrites.  Report for the medical consultation was reviewed.  Pt was on q checks for safety.  After discussing rationale for medications, their risks and benefits, other  options,side effects and alternative treatment the Pt  was started on   olanzapine  and depakote for management of moods. Patient was also started on medications  for management of her medical issues.  Consults were obtained from neurologist, nurse practitioner, speech therapist  and OT therapist.  Pt was encouraged to attend groups and activities and to work on coping   skills.  As pt. continued to do well with the help of meds and milieu therapy, she  started asking about discharge. She was very upset about this stuff situation   of  the previous nursing home and reports clearly that she would not return to the  same nursing home.  She insisted that she wanted to go to a specific nursing  home that had dementia unit.  Social worker was able  to get her a bed in that  nursing home and patient was pleased about that.  Pt is medically stable, pt has stable housing, follow up is scheduled, pt is  tolerating the meds well and denies any side effects, family does not have any  safety concerns, Treatment team feels  that Pt. has reached to maximum benefit  from inpt. Hospitalization, and she can be managed in a nursing home.  Before pt dc, we discussed all the information about  medications, side   effects,  risk , benefits, understanding of the illness and symptoms , follow up plan,  labs and vitals.  Pt feels comfortable with the plan and does not have any objection, feels   ready  for dc.  Pt has remained  medically stable.  So patient was dc .    ROS at time of discharge: negative  Vitals: Blood pressure 129/78, pulse 96, temperature 98.3 F (36.8 C),  temperature source Oral, resp. rate 18, SpO2 94 %.  Pt was seen prior to dc and pt was doing better though would have periods of  agitation and some resistance and opposition at times. Compliant with meds.  Understand the importance of follow up.    Dc MSE:    Appearance.  Middle-aged Caucasian female, looks older than stated age, has  facial tic-like movements, sticks tongue out from time to time,Not in acute  distress. Dressed up with fair hygiene.  Behavior:  Periodic agitation however redirectable  Attention span: Limited  Concentration: LimitedLanguage: able to name. repeat.  Fund of knowledge: average  Musculoskeletal : increased psychomotor activity, correct movements present,  gait not steady, muscle tone increased in some muscle groups.  Alert and oriented to time place and person.  Mood:  Okay  Affect:  Constricted, somewhat labile,mood congruent  Speech: slow to stilted in rate, not a smooth flow, normal volume  Thought process: organized -concrete  Thought content: as paranoia at times, denies auditory or visual   hallucinations  Thought association: goal directed.  SHI: none, contracts for  safety.  Memory: Limited  I/J.: Limited    Condition at time of discharge: stable, improved from TOA.    Prognosis: guarded    Diet: regular.    Activity: as tolerated.    F/U :  per SW note.

## 2016-10-07 NOTE — Other (Unsigned)
Received call from RN Misty Stanley in regards to urine culture resulting, plan to d/c   pt  today. Urine culture reviewed, see below. RN Misty Stanley informed that based on urine  culture, likely contaminated, no treatment indicated.      ROUTINE URINE CULTURE, MIDSTREAM  Order: [id number]  Status:  Final Danielle Lawrence   Visible to patient:  No (Not Released) Next appt:    None  Specimen Information: Urine; MIDSTREAM URINE      5d ago  Culture [name]:  (HIGH)  >100,000 cfu/mLMost Closely Resembling Coagulase negative Staphylococcus, not  Staphylococcus saprophyticusOrganism  Failed To Thrive For Susceptibility Testing    Organism 2 10,000 - 50,000 cfu/mLAlpha hemolytic Streptococcus, not  enterococcusNo Further Workup  Organism 3: 1,000-10,000 cfu/mLOther Multiple Gram Positive OrganismsNo   Further  Workup

## 2016-12-10 MED ORDER — divalproex (DEPAKOTE SPRINKLE) 125 mg capsule
125 | ORAL_CAPSULE | ORAL | 2 refills | Status: AC
Start: 2016-12-10 — End: 2017-08-25

## 2016-12-10 NOTE — Unmapped (Signed)
Is this a home PT?  07/30/16 CNP sent #30 w/ 11 refills of lower dose.  Please advise.

## 2017-01-14 NOTE — Telephone Encounter (Signed)
Spoke with pt's husband. States that the injection is lonis. Is asking does MD think pt would be a good candidate for the trails?

## 2017-01-14 NOTE — Unmapped (Signed)
Is he talking about Ionis? If so this drug is not yet commercially available but trials are going to be starting in the [place] (when and where the trials will be is not clear yet). He can read more about it on the [facility] website by searching Ionis. If that is not what he is asking about please find out Exactly what injection he is referring to.

## 2017-01-14 NOTE — Unmapped (Signed)
Spoke with pt's spouse. Informed of MD's message. Voiced understanding. Call completed

## 2017-01-14 NOTE — Unmapped (Signed)
Please tell him I am not sure if she would be a good candidate for the trials or not as the inclusion criteria are not available yet.

## 2017-01-14 NOTE — Unmapped (Signed)
Pt husband is calling in regards to new huntington's disease injection. Is requesting additional info regarding this injection. Please advise.

## 2017-02-10 ENCOUNTER — Ambulatory Visit: Payer: Managed Care, Other (non HMO) | Attending: Foot and Ankle Surgery | Admitting: Physical Therapy

## 2017-02-10 DIAGNOSIS — M25511 Pain in right shoulder: Secondary | ICD-10-CM | POA: Insufficient documentation

## 2017-02-10 DIAGNOSIS — G8929 Other chronic pain: Secondary | ICD-10-CM | POA: Insufficient documentation

## 2017-02-10 NOTE — Therapy (Signed)
[facility] PHYS[facility] AND [facility]ORTS MEDICINE [address], [place], [address] Phone: [phone]   Fax:  [phone]  Physical Therapy Evaluation  Patient Details  Name: Chelsea Werner MRN: [id number] Date of Birth: 1956/04/02 Referring Provider: Myles Lipps  Encounter Date: 02/10/2017      PT End of Session - 02/10/17 1412    Visit Number 1   Number of Visits 13   Date for PT Re-Evaluation 04/14/17   PT Start Time 1305   PT Stop Time 1409   PT Time Calculation (min) 64 min   Activity Tolerance Patient tolerated treatment well   Behavior During Therapy [facility] for tasks assessed/performed      Past Medical History:  Diagnosis Date  . Allergic rhinitis   . Chronic low back pain   . History of blood transfusion   . History of chicken pox   . Hx of migraines   . Hyperlipidemia   . Iritis, chronic   . Positive TB test   . Thyroid disease     Past Surgical History:  Procedure Laterality Date  . ABDOMINAL HYSTERECTOMY  1999-2000  . BACK SURGERY  1984  . BREAST BIOPSY  1988, 2008  . CESAREAN SECTION    . MYOMECTOMY  1988  . PLANTAR FASCIECTOMY  2013    There were no vitals filed for this visit.       Subjective Assessment - 02/10/17 1823    Subjective Patient reports she began having shoulder pain roughly 2 years ago, she was initially advised to have a surgery but did not follow up on that. In the past few weeks she began having progressive increase in pain, and decrease in AROM in her shoulder and thus opted for a surgery. She has had high pain levels since the operation, primarily around the infraspinatus in muscle tissue, which she reports was present prior to the surgery.    Limitations House hold activities;Lifting   Diagnostic tests Arthroscopic confirmation of large spurring, minimal tear of rotator cuff.    Patient Stated Goals To be able to use her RUE without pain again    Currently in Pain? Yes   Pain Score --  Patient  does not rate, but reports severe pain around posterior glenohumeral joint and into infraspinatus   Pain Location Shoulder   Pain Orientation Right;Posterior   Pain Descriptors / Indicators Aching;Operative site guarding   Pain Type Chronic pain;Surgical pain   Pain Onset More than a month ago   Pain Frequency Constant   Pain Relieving Factors Icing            [facility] PT Assessment - 02/10/17 1827      Assessment   Medical Diagnosis Subacromial decompression   Referring Provider Meiners   Onset Date/Surgical Date 02/08/17     Precautions   Precautions Shoulder   Precaution Comments AAROM/PROM to tolerance   Required Braces or Orthoses --  None     Restrictions   Weight Bearing Restrictions Yes   RUE Weight Bearing Non weight bearing     Balance Screen   Has the patient fallen in the past 6 months No   Has the patient had a decrease in activity level because of a fear of falling?  Yes     Home Environment   Living Environment Private residence   Living Arrangements Other relatives     Prior Function   Level of Independence Independent   Vocation Full time employment   Vocation  Requirements Chaplain at [employer]     Cognition   Overall Cognitive Status Within Functional Limits for tasks assessed     Sensation   Light Touch Appears Intact       PROM tolerated into ER to roughly 30-40 degrees at roughly 45 degrees of abduction  PROM tolerated into flexion to roughly 90-100 degrees secondary to increase in pain   Cervical extension -- full motion but mild reproduction of upper trapezius/cervical discomfort  Cervical flexion-- full motion, more stretching than true pain  Cervical rotation L -- mild decrease in motion, mild pain increase in posterior and lateral distal cervical extensor musculature Cervical rotation R - mild to moderate discomfort near where the block was likely administered -- mild to moderate loss of motion     Interventions provided E-stim at 135  V to infraspinatus and teres minor as well as posterior and middle deltoid  Soft tissue mobilization provided to long head of biceps insertion point, infraspinatus/teres minor as well as in rhomboids and trapezius as patient was having increased soft tissue discomfort in each, reports feeling much less resting pain after completion.     Objective measurements completed on examination: See above findings.                  PT Education - 02/10/17 1813    Education provided Yes   Education Details Educated patient on HEP per protocol (PROM/AAROM) and pain control options (Tens unit, ice, massage).    Person(s) Educated Patient   Methods Demonstration;Explanation;Handout   Comprehension Returned demonstration;Verbalized understanding             PT Long Term Goals - 02/10/17 1413      PT LONG TERM GOAL #1   Title Patient will demonstrate at least 160 degrees of active shoulder flexion to demonstrate improved tolerance for ADLs.    Time 8   Period Weeks   Status New   Target Date 04/07/17     PT LONG TERM GOAL #2   Title Patient will report QuickDash score of less than 15% disability to demonstrate improved tolerance for ADLs.    Time 8   Period Weeks   Status New   Target Date 04/07/17     PT LONG TERM GOAL #3   Title Patient will report worst pain of no more than 2/10 to demonstrate improved tolerance for ADLs.    Time 8   Period Weeks   Status New   Target Date 04/07/17                Plan - 02/10/17 1416    Clinical Impression Statement Patient is 2 days s/p subacromial decompression, per surgical notes she did not undergo rotator cuff repair as only a small (20%) tear was noted. She has had progressive increase in pain and functional limitations of her RUE, though noted to have full ROM under anesthesia. Today she is quite limited by pain, it appears both post-surgical and soft tissue thus limiting her ROM passively in all directions tested  (flexion, ER/IR). She does report relief from soft tissue mobilization to infraspinatus, and appears to have some cervical limitations as well as her rotation ROM, particularly to the L is limited. She will benefit from skilled PT services to address her limitation in functional motion of her RUE.    Clinical Presentation Evolving   Clinical Decision Making Moderate   Rehab Potential Good   PT Frequency 2x / week   PT Duration 6 weeks  PT Treatment/Interventions ADLs/Self Care Home Management;Aquatic Therapy;Moist Heat;Electrical Stimulation;Therapeutic activities;Therapeutic exercise;Balance training;Taping;Dry needling;Neuromuscular re-education   PT Next Visit Plan Progress pain management and soft tissue mobilization as needed.    PT Home Exercise Plan AAROM with flexion and ER ROM   Consulted and Agree with Plan of Care Patient      Patient will benefit from skilled therapeutic intervention in order to improve the following deficits and impairments:  Pain, Improper body mechanics, Decreased range of motion, Decreased strength, Impaired UE functional use, Decreased balance  Visit Diagnosis: Chronic right shoulder pain - Plan: PT plan of care cert/re-cert     Problem List Patient Active Problem List   Diagnosis Date Noted  . 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 Calculus of parotid gland duct 12/11/2015  . 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 Fibrocystic breast disease 01/03/2015  . Cervical radiculitis 01/03/2015  . Extreme obesity 01/03/2015  . Migraine with aura and without status migrainosus, not intractable 01/03/2015  . LA (lupus anticoagulant) disorder (HCC) 01/03/2015  . 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 Benign hypertension 01/03/2015  . Vitamin D deficiency 01/03/2015  . History of TB (tuberculosis) 01/03/2015  . Hives 01/03/2015  . History of pulmonary embolus (PE) 12/16/2014  . Thrombocytopenia (HCC) 07/01/2014  . Chronic right shoulder pain  04/04/2014  . Edema   . Sacral back pain 04/10/2012  . Chronic low back pain   . Iritis, chronic   . Allergic rhinitis   . Personal history of tuberculosis 10/26/2007   Alva Garnet PT, DPT, CSCS    02/10/2017, 6:33 PM  [facility] SPORTS MEDICINE [address] [place], [address] Phone: [phone]   Fax:  [phone]  Name: Chelsea Werner MRN: [id number] Date of Birth: 04/07/1956

## 2017-02-14 ENCOUNTER — Ambulatory Visit: Payer: Managed Care, Other (non HMO) | Admitting: Physical Therapy

## 2017-02-14 DIAGNOSIS — M25511 Pain in right shoulder: Secondary | ICD-10-CM | POA: Diagnosis not present

## 2017-02-14 DIAGNOSIS — G8929 Other chronic pain: Secondary | ICD-10-CM

## 2017-02-14 NOTE — Therapy (Signed)
[facility] PHYS[facility]TS MEDICINE [address] [place], [place], [address] Phone: [phone]   Fax:  [phone]  Physical Therapy Treatment  Patient Details  Name: Chelsea Werner MRN: [id number] Date of Birth: 1955/09/20 Referring Provider: Myles LippsMeiners  Encounter Date: 02/14/2017      PT End of Session - 02/14/17 1549    Visit Number 2   Number of Visits 13   Date for PT Re-Evaluation 04/14/17   PT Start Time 1531   PT Stop Time 1610   PT Time Calculation (min) 39 min   Activity Tolerance Patient tolerated treatment well   Behavior [facility] for tasks assessed/performed      Past Medical History:  Diagnosis Date  . Allergic rhinitis   . Chronic low back pain   . History of blood transfusion   . History of chicken pox   . Hx of migraines   . Hyperlipidemia   . Iritis, chronic   . Positive TB test   . Thyroid disease     Past Surgical History:  Procedure Laterality Date  . ABDOMINAL HYSTERECTOMY  1999-2000  . BACK SURGERY  1984  . BREAST BIOPSY  1988, 2008  . CESAREAN SECTION    . MYOMECTOMY  1988  . PLANTAR FASCIECTOMY  2013    There were no vitals filed for this visit.      Subjective Assessment - 02/14/17 1534    Subjective Patient reports her pain was much better after previous PT session, though has gradually increased over the weekend (still more tolerable than where she was at last week before PT session). She has been completing her HEP, though some sharp pain with flexion noted.    Limitations House hold activities;Lifting   Diagnostic tests Arthroscopic confirmation of large spurring, minimal tear of rotator cuff.    Patient Stated Goals To be able to use her RUE without pain again    Currently in Pain? Yes   Pain Score 7    Pain Location Shoulder   Pain Orientation Right;Lateral   Pain Descriptors / Indicators Aching;Operative site guarding   Pain Type Chronic pain;Surgical pain   Pain Onset More  than a month ago   Pain Frequency Constant         Soft tissue mobilization to the upper trapezius, rhomboids initially with tenderness noted initially, but decrease in symptoms with prolonged exposure to soft tissue work.   Provided high volt stimulation over infraspinatus/upper trapezius and posterior/middle deltoid at 115V and 70V respectively with ice pack on R shoulder. X 15 minutes  Soft tissue mobilization over posterior and middle deltoids with reported significant decrease in pain/symptoms present. She reports when reaching behind to help with putting her shirt on she does not feel any of the pinching sensation afterwards.                          PT Education - 02/14/17 1548    Education provided Yes   Education Details Keep up with ice, decrease use of RUE as much as possible.    Person(s) Educated Patient   Methods Explanation   Comprehension Verbalized understanding             PT Long Term Goals - 02/10/17 1413      PT LONG TERM GOAL #1   Title Patient will demonstrate at least 160 degrees of active shoulder flexion to demonstrate improved tolerance for ADLs.  Time 8   Period Weeks   Status New   Target Date 04/07/17     PT LONG TERM GOAL #2   Title Patient will report QuickDash score of less than 15% disability to demonstrate improved tolerance for ADLs.    Time 8   Period Weeks   Status New   Target Date 04/07/17     PT LONG TERM GOAL #3   Title Patient will report worst pain of no more than 2/10 to demonstrate improved tolerance for ADLs.    Time 8   Period Weeks   Status New   Target Date 04/07/17               Plan - 02/14/17 1549    Clinical Impression Statement Patient continues to have high pain levels at rest, in the experience of this therapist it is best to address this with modalities and soft tissue mobilization. After modalities provided this date, patient reports no sharp pain while putting her shirt on,  and decreased pain generally about her shoulder. Will transition to more active exercise as indicated and tolerated.    Clinical Presentation Stable   Clinical Decision Making Moderate   Rehab Potential Good   PT Frequency 2x / week   PT Duration 6 weeks   PT Treatment/Interventions ADLs/Self Care Home Management;Aquatic Therapy;Moist Heat;Electrical Stimulation;Therapeutic activities;Therapeutic exercise;Balance training;Taping;Dry needling;Neuromuscular re-education   PT Next Visit Plan Progress pain management and soft tissue mobilization as needed.    PT Home Exercise Plan AAROM with flexion and ER ROM   Consulted and Agree with Plan of Care Patient      Patient will benefit from skilled therapeutic intervention in order to improve the following deficits and impairments:  Pain, Improper body mechanics, Decreased range of motion, Decreased strength, Impaired UE functional use, Decreased balance  Visit Diagnosis: Chronic right shoulder pain     Problem List Patient Active Problem List   Diagnosis Date Noted  . 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 Calculus of parotid gland duct 12/11/2015  . 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 Fibrocystic breast disease 01/03/2015  . Cervical radiculitis 01/03/2015  . Extreme obesity 01/03/2015  . Migraine with aura and without status migrainosus, not intractable 01/03/2015  . LA (lupus anticoagulant) disorder (HCC) 01/03/2015  . 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 Benign hypertension 01/03/2015  . Vitamin D deficiency 01/03/2015  . History of TB (tuberculosis) 01/03/2015  . Hives 01/03/2015  . History of pulmonary embolus (PE) 12/16/2014  . Thrombocytopenia (HCC) 07/01/2014  . Chronic right shoulder pain 04/04/2014  . Edema   . Sacral back pain 04/10/2012  . Chronic low back pain   . Iritis, chronic   . Allergic rhinitis   . Personal history of tuberculosis 10/26/2007   Alva Garnet PT, DPT, CSCS     02/15/2017, 10:27 AM  [facility] PHYSICAL AND SPORTS MEDICINE [address] [place], [address] Phone: [phone]   Fax:  [phone]  Name: Chelsea Werner MRN: [id number] Date of Birth: 1955/07/07

## 2017-02-14 NOTE — Patient Instructions (Addendum)
Soft tissue mobilization to the upper trapezius, rhomboids initially  Provided high volt stimulation over infraspinatus/upper trapezius and posterior/middle deltoid at 115V and 70V respectively with ice pack on R shoulder. X 15 minutes  Soft tissue mobilization over posterior and middle deltoids

## 2017-02-18 ENCOUNTER — Emergency Department: Payer: Managed Care, Other (non HMO)

## 2017-02-18 ENCOUNTER — Emergency Department
Admission: EM | Admit: 2017-02-18 | Discharge: 2017-02-18 | Disposition: A | Discharge status: Home / Self Care | Payer: Managed Care, Other (non HMO) | Attending: Student in an Organized Health Care Education/Training Program | Admitting: Student in an Organized Health Care Education/Training Program

## 2017-02-18 ENCOUNTER — Ambulatory Visit: Payer: Managed Care, Other (non HMO) | Admitting: Physical Therapy

## 2017-02-18 ENCOUNTER — Encounter: Payer: Self-pay | Admitting: Emergency Medicine

## 2017-02-18 DIAGNOSIS — R079 Chest pain, unspecified: Secondary | ICD-10-CM | POA: Diagnosis not present

## 2017-02-18 DIAGNOSIS — E079 Disorder of thyroid, unspecified: Secondary | ICD-10-CM | POA: Insufficient documentation

## 2017-02-18 DIAGNOSIS — R0602 Shortness of breath: Secondary | ICD-10-CM | POA: Diagnosis present

## 2017-02-18 DIAGNOSIS — R2242 Localized swelling, mass and lump, left lower limb: Secondary | ICD-10-CM | POA: Insufficient documentation

## 2017-02-18 DIAGNOSIS — M25511 Pain in right shoulder: Principal | ICD-10-CM

## 2017-02-18 DIAGNOSIS — G8929 Other chronic pain: Secondary | ICD-10-CM

## 2017-02-18 DIAGNOSIS — Z79899 Other long term (current) drug therapy: Secondary | ICD-10-CM | POA: Insufficient documentation

## 2017-02-18 DIAGNOSIS — R61 Generalized hyperhidrosis: Secondary | ICD-10-CM | POA: Diagnosis not present

## 2017-02-18 LAB — BASIC METABOLIC PANEL
Anion gap: 8 (ref 5–15)
BUN: 16 mg/dL (ref 6–20)
CALCIUM: 9.2 mg/dL (ref 8.9–10.3)
CO2: 26 mmol/L (ref 22–32)
CREATININE: 0.83 mg/dL (ref 0.44–1.00)
Chloride: 105 mmol/L (ref 101–111)
GFR calc non Af Amer: >60 mL/min (ref >60)
Glucose, Bld: 139 mg/dL — ABNORMAL HIGH (ref 65–99)
Potassium: 4.2 mmol/L (ref 3.5–5.1)
SODIUM: 139 mmol/L (ref 135–145)

## 2017-02-18 LAB — TROPONIN I: Troponin I: <0.03 ng/mL (ref <0.03)

## 2017-02-18 LAB — CBC
HCT: 41.6 % (ref 35.0–47.0)
Hemoglobin: 13.8 g/dL (ref 12.0–16.0)
MCH: 26.5 pg (ref 26.0–34.0)
MCHC: 33.2 g/dL (ref 32.0–36.0)
MCV: 79.8 fL — ABNORMAL LOW (ref 80.0–100.0)
PLATELETS: 196 10*3/uL (ref 150–440)
RBC: 5.21 MIL/uL — ABNORMAL HIGH (ref 3.80–5.20)
RDW: 14.4 % (ref 11.5–14.5)
WBC: 6.2 10*3/uL (ref 3.6–11.0)

## 2017-02-18 MED ORDER — IOPAMIDOL (ISOVUE-370) INJECTION 76%
75.0000 mL | Freq: Once | INTRAVENOUS | Status: AC | PRN
  Administered 2017-02-18: 75 mL via INTRAVENOUS

## 2017-02-18 MED ORDER — SODIUM CHLORIDE 0.9 % IV BOLUS (SEPSIS)
500.0000 mL | Freq: Once | INTRAVENOUS | Status: AC
  Administered 2017-02-18: 500 mL via INTRAVENOUS

## 2017-02-18 NOTE — ED Notes (Signed)
Called in the waiting room with no answer. °

## 2017-02-18 NOTE — ED Notes (Addendum)
Called patient with waiting room to bring to room.  No answer.  Unable to locate patient.

## 2017-02-18 NOTE — Discharge Instructions (Signed)
Return to ER for any increase in pain, if the pain changes or becomes worse with physical activity, you have shortness of breath, nausea or vomiting associated with the chest pain. ° °

## 2017-02-18 NOTE — ED Notes (Signed)
Patient spotted in the waiting room by writer.  Approached patient and informed her that she had been called for a room several times.  Patient states she has not left the waiting room.

## 2017-02-18 NOTE — ED Notes (Signed)
Pt in CT.

## 2017-02-18 NOTE — ED Notes (Signed)
Patient transported to US 

## 2017-02-18 NOTE — ED Provider Notes (Signed)
[facility] Emergency Department Provider Note    First MD Initiated Contact with Patient 02/18/17 1821     (approximate)  I have reviewed the triage vital signs and the nursing notes.   HISTORY  Chief Complaint Shortness of Breath    HPI Chelsea Werner is a 61 y.o. 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 Has had some intermittent diaphoresis associated with this. Denies any chest pressure. No pain radiating through to her back. States she has also noted swelling in her left lower leg.   Past Medical History:  Diagnosis Date  . Allergic rhinitis   . Chronic low back pain   . History of blood transfusion   . History of chicken pox   . Hx of migraines   . Hyperlipidemia   . Iritis, chronic   . Positive TB test   . Thyroid disease    Family History  Problem Relation Age of Onset  . Hypertension Father   . Early death Mother   . Thyroid cancer Sister   . Deep vein thrombosis Sister   . Parkinson's disease Brother   . Prostate cancer Brother    Past Surgical History:  Procedure Laterality Date  . ABDOMINAL HYSTERECTOMY  1999-2000  . BACK SURGERY  1984  . BREAST BIOPSY  1988, 2008  . CESAREAN SECTION    . MYOMECTOMY  1988  . PLANTAR FASCIECTOMY  2013   Patient Active Problem List   Diagnosis Date Noted  . 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 Calculus of parotid gland duct 12/11/2015  . 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 Fibrocystic breast disease 01/03/2015  . Cervical radiculitis 01/03/2015  . Extreme obesity 01/03/2015  . Migraine with aura and without status migrainosus, not intractable 01/03/2015  . LA (lupus anticoagulant) disorder (HCC) 01/03/2015  . 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 Benign hypertension 01/03/2015  . Vitamin D deficiency 01/03/2015  . History of TB (tuberculosis)  01/03/2015  . Hives 01/03/2015  . History of pulmonary embolus (PE) 12/16/2014  . Thrombocytopenia (HCC) 07/01/2014  . Chronic right shoulder pain 04/04/2014  . Edema   . Sacral back pain 04/10/2012  . Chronic low back pain   . Iritis, chronic   . Allergic rhinitis   . Personal history of tuberculosis 10/26/2007      Prior to Admission medications   Medication Sig Start Date End Date Taking? Authorizing Provider  amoxicillin-clavulanate (AUGMENTIN) 875-125 MG tablet Take 1 tablet by mouth 2 (two) times daily. 12/11/15   Alba Cory, MD  clobetasol cream (TEMOVATE) 0.05 % Apply 1 application topically as needed.    [provider]  fluticasone (FLONASE) 50 MCG/ACT nasal spray Place 2 sprays into both nostrils daily. 09/05/15   Alba Cory, MD  furosemide (LASIX) 20 MG tablet Take 2 tablets (40 mg total) by mouth daily as needed for fluid or edema. 09/05/15   Alba Cory, MD  hydrocortisone valerate cream (WESTCORT) 0.2 % Apply topically.    [provider]  hydrOXYzine (ATARAX/VISTARIL) 25 MG tablet Take 1 tablet (25 mg total) by mouth 3 (three) times daily as needed. 04/01/16   Alba Cory, MD  Hypromellose 0.3 % SOLN Apply 1 drop to eye 2 (two) times daily. 06/16/15   Alba Cory, MD  lidocaine (LIDODERM) 5 % Place 2 patches onto the skin daily. 09/05/15   Sowles, Danna Hefty,  MD  loratadine (CLARITIN) 10 MG tablet Take 1 tablet (10 mg total) by mouth daily. 06/20/15   Alba Cory, MD  losartan-hydrochlorothiazide (HYZAAR) 50-12.5 MG tablet Take 1 tablet by mouth daily. 09/05/15   Alba Cory, MD  Multiple Vitamins-Minerals (WOMENS MULTIVITAMIN PLUS) TABS Take 1 tablet by mouth daily.    [provider]  omega-3 acid ethyl esters (LOVAZA) 1 G capsule Take by mouth 2 (two) times daily.    [provider]  Polyethyl Glycol-Propyl Glycol (SYSTANE) 0.4-0.3 % GEL ophthalmic gel Place 1 application into both eyes 2 (two) times daily. 06/20/15    Alba Cory, MD  potassium chloride (KLOR-CON) 20 MEQ packet Take 20 mEq by mouth daily. 09/05/15   Alba Cory, MD  Probiotic Product (PROBIOTIC FORMULA PO) Take 1 capsule by mouth daily.    [provider]    Allergies Xolair [omalizumab]; Metaxalone; Ace inhibitors; Nsaids; Sulfa antibiotics; Clindamycin/lincomycin; and Tramadol    Social History Social History  Substance Use Topics  . Smoking status: Never Smoker  . Smokeless tobacco: Never Used  . Alcohol use 0.0 oz/week     Comment: occasional    Review of Systems Patient denies headaches, rhinorrhea, blurry vision, numbness, shortness of breath, chest pain, edema, cough, abdominal pain, nausea, vomiting, diarrhea, dysuria, fevers, rashes or hallucinations unless otherwise stated above in HPI. ____________________________________________   PHYSICAL EXAM:  VITAL SIGNS: Vitals:   02/18/17 1356  BP: 139/68  Pulse: 78  Resp: 18  Temp: 97.8 F (36.6 C)  SpO2: 100%    Constitutional: Alert and oriented. Well appearing and in no acute distress. Eyes: Conjunctivae are normal.  Head: Atraumatic. Nose: No congestion/rhinnorhea. Mouth/Throat: Mucous membranes are moist.   Neck: No stridor. Painless ROM.  Cardiovascular: Normal rate, regular rhythm. Grossly normal heart sounds.  Good peripheral circulation. Respiratory: Normal respiratory effort.  No retractions. Lungs CTAB. Gastrointestinal: Soft and nontender. No distention. No abdominal bruits. No CVA tenderness. Genitourinary:  Musculoskeletal:  Right shoulder in sling, No lower extremity tenderness.  Trace lle edema.  No joint effusions. Neurologic:  Normal speech and language. No gross focal neurologic deficits are appreciated. No facial droop Skin:  Skin is warm, dry and intact. No rash noted. Psychiatric: Mood and affect are normal. Speech and behavior are normal.  ____________________________________________   LABS (all labs ordered are listed,  but only abnormal results are displayed)  Results for orders placed or performed during the hospital encounter of 02/18/17 (from the past 24 hour(s))  Basic metabolic panel     Status: Abnormal   Collection Time: 02/18/17  2:15 PM  Result Value Ref Range   Sodium 139 135 - 145 mmol/L   Potassium 4.2 3.5 - 5.1 mmol/L   Chloride 105 101 - 111 mmol/L   CO2 26 22 - 32 mmol/L   Glucose, Bld 139 (H) 65 - 99 mg/dL   BUN 16 6 - 20 mg/dL   Creatinine, Ser 9.56 0.44 - 1.00 mg/dL   Calcium 9.2 8.9 - 21.3 mg/dL   GFR calc non Af Amer >60 >60 mL/min   GFR calc Af Amer >60 >60 mL/min   Anion gap 8 5 - 15  CBC     Status: Abnormal   Collection Time: 02/18/17  2:15 PM  Result Value Ref Range   WBC 6.2 3.6 - 11.0 K/uL   RBC 5.21 (H) 3.80 - 5.20 MIL/uL   Hemoglobin 13.8 12.0 - 16.0 g/dL   HCT 08.6 57.8 - 46.9 %   MCV  79.8 (L) 80.0 - 100.0 fL   MCH 26.5 26.0 - 34.0 pg   MCHC 33.2 32.0 - 36.0 g/dL   RDW 50.5 69.7 - 94.8 %   Platelets 196 150 - 440 K/uL  Troponin I     Status: None   Collection Time: 02/18/17  2:15 PM  Result Value Ref Range   Troponin I <0.03 <0.03 ng/mL   ____________________________________________  EKG My review and personal interpretation at Time: 14:03   Indication: sob  Rate: 70  Rhythm: sinus Axis: normal Other: normal intervals, no stemi, non specific st changes ____________________________________________  RADIOLOGY  I personally reviewed all radiographic images ordered to evaluate for the above acute complaints and reviewed radiology reports and findings.  These findings were personally discussed with the patient.  Please see medical record for radiology report.  ____________________________________________   PROCEDURES  Procedure(s) performed:  Procedures    Critical Care performed: no ____________________________________________   INITIAL IMPRESSION / ASSESSMENT AND PLAN / ED COURSE  Pertinent labs & imaging results that were available during my  care of the patient were reviewed by me and considered in my medical decision making (see chart for details).  DDX: Asthma, copd, CHF, pna, ptx, malignancy, Pe, anemia   Chelsea Werner is a 61 y.o. who presents to the ED with Shortness of breath as described above. Since then been on going for quite some time now. Patient no acute distress. Seems less consistent with ACS or congestive heart failure. EKG without any acute ischemia and her troponin negative 2. Chest x-ray without evidence of consolidation or congestive heart failure. Based on her history of DVT and PE CT angiogram ordered which shows no evidence of pulmonary embolism, pneumonia or acute process. Lower extremity duplex shows no evidence of blood clot. Blood work is otherwise reassuring the patient is without any hypoxia. This finally feel that she is stable for further workup with her PCP.      ____________________________________________   FINAL CLINICAL IMPRESSION(S) / ED DIAGNOSES  Final diagnoses:  Shortness of breath      NEW MEDICATIONS STARTED DURING THIS VISIT:  New Prescriptions   No medications on file     Note:  This document was prepared using Dragon voice recognition software and may include unintentional dictation errors.    Willy Eddy, MD 02/18/17 2021

## 2017-02-18 NOTE — Therapy (Signed)
[facility] PHYS[facility] AND [facility]ORTS MEDICINE [address] [place], [address] Phone: [phone]   Fax:  [phone]  Physical Therapy Treatment  Patient Details  Name: Chelsea Werner MRN: [id number] Date of Birth: February 18, 1956 Referring Provider: Myles Lipps  Encounter Date: 02/18/2017    Past Medical History:  Diagnosis Date  . Allergic rhinitis   . Chronic low back pain   . History of blood transfusion   . History of chicken pox   . Hx of migraines   . Hyperlipidemia   . Iritis, chronic   . Positive TB test   . Thyroid disease     Past Surgical History:  Procedure Laterality Date  . ABDOMINAL HYSTERECTOMY  1999-2000  . BACK SURGERY  1984  . BREAST BIOPSY  1988, 2008  . CESAREAN SECTION    . MYOMECTOMY  1988  . PLANTAR FASCIECTOMY  2013    There were no vitals filed for this visit.    Patient presents to clinic and reports she has had profuse sweating and shortness of breath (progressively increasing) over the last week. This is the first time she has mentioned this to this author. She has a history of DVT, TB, and PE and is 1 week post-operation. She reports she called her PCP two days ago, who recommended she go to the emergency room. Given her profuse sweating in clinic today, PT attempted to reach surgical team who were unavailable. Recommended patient follow advice of PCP and go for further evaluation to rule out blood clots.                                 PT Long Term Goals - 02/10/17 1413      PT LONG TERM GOAL #1   Title Patient will demonstrate at least 160 degrees of active shoulder flexion to demonstrate improved tolerance for ADLs.    Time 8   Period Weeks   Status New   Target Date 04/07/17     PT LONG TERM GOAL #2   Title Patient will report QuickDash score of less than 15% disability to demonstrate improved tolerance for ADLs.    Time 8   Period Weeks   Status New   Target Date  04/07/17     PT LONG TERM GOAL #3   Title Patient will report worst pain of no more than 2/10 to demonstrate improved tolerance for ADLs.    Time 8   Period Weeks   Status New   Target Date 04/07/17             Patient will benefit from skilled therapeutic intervention in order to improve the following deficits and impairments:     Visit Diagnosis: Chronic right shoulder pain     Problem List Patient Active Problem List   Diagnosis Date Noted  . 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 Calculus of parotid gland duct 12/11/2015  . 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 Fibrocystic breast disease 01/03/2015  . Cervical radiculitis 01/03/2015  . Extreme obesity 01/03/2015  . Migraine with aura and without status migrainosus, not intractable 01/03/2015  . LA (lupus anticoagulant) disorder (HCC) 01/03/2015  . 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 Benign hypertension 01/03/2015  . Vitamin D deficiency 01/03/2015  . History of TB (tuberculosis) 01/03/2015  . Hives 01/03/2015  . History of pulmonary embolus (PE) 12/16/2014  . Thrombocytopenia (HCC) 07/01/2014  . Chronic right  shoulder pain 04/04/2014  . Edema   . Sacral back pain 04/10/2012  . Chronic low back pain   . Iritis, chronic   . Allergic rhinitis   . Personal history of tuberculosis 10/26/2007   Alva Garnet PT, DPT, CSCS    02/18/2017, 12:01 PM  [facility] PHYSICAL AND SPORTS MEDICINE [address] [place], [address] Phone: [phone]   Fax:  [phone]  Name: Chelsea Werner MRN: [id number] Date of Birth: [birth year]

## 2017-02-18 NOTE — ED Triage Notes (Signed)
Pt arrives and states she is having some shortness of breath and "profuse sweating" for about three days. Hx right shoulder surgery 01/27/17. Hx of PE 07/2014. Not currently on any anticoagulants. Pt not c/o shortness of breath at this time. Only states she is having chest tightness. Lung sounds clear bilaterally. Skin warm and dry.

## 2017-02-22 ENCOUNTER — Encounter: Payer: Self-pay | Admitting: Physical Therapy

## 2017-02-22 ENCOUNTER — Ambulatory Visit: Payer: Managed Care, Other (non HMO) | Admitting: Physical Therapy

## 2017-02-22 DIAGNOSIS — M25511 Pain in right shoulder: Principal | ICD-10-CM

## 2017-02-22 DIAGNOSIS — G8929 Other chronic pain: Secondary | ICD-10-CM

## 2017-02-22 NOTE — Therapy (Signed)
[facility] PH[facility]ORTS MEDICINE [address], [place], [address] Phone: [phone]   Fax:  [phone]  Physical Therapy Treatment  Patient Details  Name: Chelsea Werner MRN: [id number] Date of Birth: May 22, 1956 Referring Provider: Myles Lipps  Encounter Date: 02/22/2017      PT End of Session - 02/22/17 1347    Visit Number 3   Number of Visits 13   Date for PT Re-Evaluation 04/14/17   PT Start Time 1346   PT Stop Time 1427   PT Time Calculation (min) 41 min   Activity Tolerance Patient tolerated treatment well   Behavior During Therapy [facility] for tasks assessed/performed      Past Medical History:  Diagnosis Date  . Allergic rhinitis   . Chronic low back pain   . History of blood transfusion   . History of chicken pox   . Hx of migraines   . Hyperlipidemia   . Iritis, chronic   . Positive TB test   . Thyroid disease     Past Surgical History:  Procedure Laterality Date  . ABDOMINAL HYSTERECTOMY  1999-2000  . BACK SURGERY  1984  . BREAST BIOPSY  1988, 2008  . CESAREAN SECTION    . MYOMECTOMY  1988  . PLANTAR FASCIECTOMY  2013    There were no vitals filed for this visit.      Subjective Assessment - 02/22/17 1350    Subjective Pt presented to the ED at [facility] on 8/17 due to SOB and chest pain and LLE swelling. Per chart review: "EKG without any acute ischemia and her troponin negative 2. Chest x-ray without evidence of consolidation or congestive heart failure. Based on her history of DVT and PE CT angiogram ordered which shows no evidence of pulmonary embolism, pneumonia or acute process. Lower extremity duplex shows no evidence of blood clot. Blood work is otherwise reassuring the patient is without any hypoxia." It was felt that the pt could be d/c'd from the hospital to follow up with her PCP.  Pt reports she is having trouble sleeping because of her pain which continues to be severe.  She does believe that  physical therapy is helping with her pain.  She has been completing her HEP as previously provided without questions or concerns.    Limitations House hold activities;Lifting   Diagnostic tests Arthroscopic confirmation of large spurring, minimal tear of rotator cuff.    Patient Stated Goals To be able to use her RUE without pain again    Pain Score 8    Pain Location Shoulder   Pain Orientation Right   Pain Descriptors / Indicators Aching;Sharp;Throbbing   Pain Onset More than a month ago   Multiple Pain Sites No        TREATMENT:   Soft tissue mobilization to the upper trapezius, rhomboids, deltoids on the RUE with positive response with pt reporting a decreasing in pain to 6/10. She reports this is the first time her pain has been down to a 6/10 since surgery.  ?  Provided high volt stimulation over infraspinatus/upper trapezius and posterior/middle deltoid at 115V and 70V respectively with ice pack on R shoulder. X 15 minutes             PT Education - 02/22/17 1347    Education provided Yes   Education Details To continue with HEP as previously prescribed; clinical reasoning behind interventions   Person(s) Educated Patient   Methods Explanation   Comprehension  Verbalized understanding             PT Long Term Goals - 02/10/17 1413      PT LONG TERM GOAL #1   Title Patient will demonstrate at least 160 degrees of active shoulder flexion to demonstrate improved tolerance for ADLs.    Time 8   Period Weeks   Status New   Target Date 04/07/17     PT LONG TERM GOAL #2   Title Patient will report QuickDash score of less than 15% disability to demonstrate improved tolerance for ADLs.    Time 8   Period Weeks   Status New   Target Date 04/07/17     PT LONG TERM GOAL #3   Title Patient will report worst pain of no more than 2/10 to demonstrate improved tolerance for ADLs.    Time 8   Period Weeks   Status New   Target Date 04/07/17                Plan - 02/22/17 1421    Clinical Impression Statement Pt continues to report severe pain in RUE region but does believe this is improving with physical therapy intervention.  She responded well to STM to R shoulder region, reporting this is the first time her pain has been down to a 6/10 since surgery.  She additionally reported a decrease in discomfort following high volt to R shoulder region.  Discussed and demonstrated proper positioning of pillows when pt sleeps in L sidelying which is her preferred sleeping position.  Pt will continue to benefit from skilled PT interventions for decreased pain with goal to be able to progress to mobility and strengthening exercises.   Rehab Potential Good   PT Frequency 2x / week   PT Duration 6 weeks   PT Treatment/Interventions ADLs/Self Care Home Management;Aquatic Therapy;Moist Heat;Electrical Stimulation;Therapeutic activities;Therapeutic exercise;Balance training;Taping;Dry needling;Neuromuscular re-education   PT Next Visit Plan Progress pain management and soft tissue mobilization as needed.    PT Home Exercise Plan AAROM with flexion and ER ROM   Consulted and Agree with Plan of Care Patient      Patient will benefit from skilled therapeutic intervention in order to improve the following deficits and impairments:  Pain, Improper body mechanics, Decreased range of motion, Decreased strength, Impaired UE functional use, Decreased balance  Visit Diagnosis: Chronic right shoulder pain     Problem List Patient Active Problem List   Diagnosis Date Noted  . 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 Calculus of parotid gland duct 12/11/2015  . 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 Fibrocystic breast disease 01/03/2015  . Cervical radiculitis 01/03/2015  . Extreme obesity 01/03/2015  . Migraine with aura and without status migrainosus, not intractable 01/03/2015  . LA (lupus anticoagulant) disorder (HCC) 01/03/2015  . Lichen sclerosus of female  genitalia 01/03/2015  . Benign hypertension 01/03/2015  . Vitamin D deficiency 01/03/2015  . History of TB (tuberculosis) 01/03/2015  . Hives 01/03/2015  . History of pulmonary embolus (PE) 12/16/2014  . Thrombocytopenia (HCC) 07/01/2014  . Chronic right shoulder pain 04/04/2014  . Edema   . Sacral back pain 04/10/2012  . Chronic low back pain   . Iritis, chronic   . Allergic rhinitis   . Personal history of tuberculosis 10/26/2007    Encarnacion Chu PT, DPT 02/22/2017, 2:27 PM  [place] PHYSICAL AND SPORTS MEDICINE [address] [place], [address] Phone: [phone]   Fax:  [phone]  Name: Corianna  AVAIAH GULICK MRN: [id number] Date of Birth: 1955/09/15

## 2017-02-24 ENCOUNTER — Ambulatory Visit: Payer: Managed Care, Other (non HMO) | Admitting: Physical Therapy

## 2017-02-24 ENCOUNTER — Encounter: Payer: Self-pay | Admitting: Physical Therapy

## 2017-02-24 DIAGNOSIS — M25511 Pain in right shoulder: Secondary | ICD-10-CM | POA: Diagnosis not present

## 2017-02-24 DIAGNOSIS — G8929 Other chronic pain: Secondary | ICD-10-CM

## 2017-02-24 NOTE — Therapy (Signed)
[place] [facility] PH[facility] MEDICINE [address], [place], [address] Phone: [phone]   Fax:  [phone]  Physical Therapy Treatment  Patient Details  Name: Chelsea Werner MRN: [id number] Date of Birth: 01/16/1956 Referring Provider: Myles Lipps  Encounter Date: 02/24/2017      PT End of Session - 02/24/17 1034    Visit Number 4   Number of Visits 13   Date for PT Re-Evaluation 04/14/17   PT Start Time 1032   PT Stop Time 1114   PT Time Calculation (min) 42 min   Activity Tolerance Patient tolerated treatment well   Behavior During Therapy [facility] for tasks assessed/performed      Past Medical History:  Diagnosis Date  . Allergic rhinitis   . Chronic low back pain   . History of blood transfusion   . History of chicken pox   . Hx of migraines   . Hyperlipidemia   . Iritis, chronic   . Positive TB test   . Thyroid disease     Past Surgical History:  Procedure Laterality Date  . ABDOMINAL HYSTERECTOMY  1999-2000  . BACK SURGERY  1984  . BREAST BIOPSY  1988, 2008  . CESAREAN SECTION    . MYOMECTOMY  1988  . PLANTAR FASCIECTOMY  2013    There were no vitals filed for this visit.      Subjective Assessment - 02/24/17 1034    Subjective Pt reports shooting pain down RUE has continued.  Overall she believes her pain has improved.  She reports she has had more comfort with adjustment in pillow positioning when sleeping.     Limitations House hold activities;Lifting   Diagnostic tests Arthroscopic confirmation of large spurring, minimal tear of rotator cuff.    Patient Stated Goals To be able to use her RUE without pain again    Currently in Pain? Yes   Pain Score 7    Pain Location Shoulder   Pain Orientation Right   Pain Descriptors / Indicators Aching;Throbbing;Shooting   Pain Type Surgical pain   Pain Onset More than a month ago   Multiple Pain Sites No      TREATMENT:   Soft tissue mobilization to the upper  trapezius, rhomboids, deltoids on the RUE with positive response with pt reporting a decrease in pain.  R shoulder PROM into ER and F, very limited range especially in ER due to pain. Pt has a hard time relaxing.   Provided high volt stimulation over infraspinatus/upper trapezius and posterior/middle deltoid at 115V and 70V respectively with ice pack on R shoulder. X 12 minutes              PT Education - 02/24/17 1034    Education provided Yes   Education Details Exercise technique   Person(s) Educated Patient   Methods Explanation;Demonstration   Comprehension Verbalized understanding;Returned demonstration;Verbal cues required;Need further instruction             PT Long Term Goals - 02/10/17 1413      PT LONG TERM GOAL #1   Title Patient will demonstrate at least 160 degrees of active shoulder flexion to demonstrate improved tolerance for ADLs.    Time 8   Period Weeks   Status New   Target Date 04/07/17     PT LONG TERM GOAL #2   Title Patient will report QuickDash score of less than 15% disability to demonstrate improved tolerance for ADLs.    Time 8  Period Weeks   Status New   Target Date 04/07/17     PT LONG TERM GOAL #3   Title Patient will report worst pain of no more than 2/10 to demonstrate improved tolerance for ADLs.    Time 8   Period Weeks   Status New   Target Date 04/07/17               Plan - 02/24/17 1110    Clinical Impression Statement Pt responded well to STM and high volt with decrease in pain.  She initially has a difficult time relaxing with PROM R shoulder into F and ER but with repetition and gentle oscillations, pt is able to tolerate without pain.  She will continue to benefit from  progression of RUE activity as tolerated as she returns to work in 2 weeks.   Rehab Potential Good   PT Frequency 2x / week   PT Duration 6 weeks   PT Treatment/Interventions ADLs/Self Care Home Management;Aquatic Therapy;Moist  Heat;Electrical Stimulation;Therapeutic activities;Therapeutic exercise;Balance training;Taping;Dry needling;Neuromuscular re-education   PT Next Visit Plan Progress pain management and soft tissue mobilization as needed.    PT Home Exercise Plan AAROM with flexion and ER ROM   Consulted and Agree with Plan of Care Patient      Patient will benefit from skilled therapeutic intervention in order to improve the following deficits and impairments:  Pain, Improper body mechanics, Decreased range of motion, Decreased strength, Impaired UE functional use, Decreased balance  Visit Diagnosis: Chronic right shoulder pain     Problem List Patient Active Problem List   Diagnosis Date Noted  . 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 Calculus of parotid gland duct 12/11/2015  . 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 Fibrocystic breast disease 01/03/2015  . Cervical radiculitis 01/03/2015  . Extreme obesity 01/03/2015  . Migraine with aura and without status migrainosus, not intractable 01/03/2015  . LA (lupus anticoagulant) disorder (HCC) 01/03/2015  . 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 Benign hypertension 01/03/2015  . Vitamin D deficiency 01/03/2015  . History of TB (tuberculosis) 01/03/2015  . Hives 01/03/2015  . History of pulmonary embolus (PE) 12/16/2014  . Thrombocytopenia (HCC) 07/01/2014  . Chronic right shoulder pain 04/04/2014  . Edema   . Sacral back pain 04/10/2012  . Chronic low back pain   . Iritis, chronic   . Allergic rhinitis   . Personal history of tuberculosis 10/26/2007   Chelsea Werner PT, DPT 02/24/2017, 11:40 AM   [facility] PHYSICAL AND SPORTS MEDICINE [address] [place], [address] Phone: [phone]   Fax:  [phone]  Name: Chelsea Werner MRN: [id number] Date of Birth: [birth year]

## 2017-02-28 ENCOUNTER — Ambulatory Visit: Payer: Managed Care, Other (non HMO)

## 2017-02-28 DIAGNOSIS — G8929 Other chronic pain: Secondary | ICD-10-CM

## 2017-02-28 DIAGNOSIS — M25511 Pain in right shoulder: Secondary | ICD-10-CM | POA: Diagnosis not present

## 2017-02-28 NOTE — Therapy (Signed)
[place] [facility] [address], [place], [address] Phone: [phone]   Fax:  [phone]  Physical Therapy Treatment  Patient Details  Name: Chelsea Werner MRN: [id number] Date of Birth: Mar 21, 1956 Referring Provider: Myles Lipps  Encounter Date: 02/28/2017      PT End of Session - 02/28/17 1353    Visit Number 5   Number of Visits 13   Date for PT Re-Evaluation 04/14/17   PT Start Time 1348   PT Stop Time 1430   PT Time Calculation (min) 42 min   Activity Tolerance Patient tolerated treatment well   Behavior During Therapy [facility] for tasks assessed/performed      Past Medical History:  Diagnosis Date  . Allergic rhinitis   . Chronic low back pain   . History of blood transfusion   . History of chicken pox   . Hx of migraines   . Hyperlipidemia   . Iritis, chronic   . Positive TB test   . Thyroid disease     Past Surgical History:  Procedure Laterality Date  . ABDOMINAL HYSTERECTOMY  1999-2000  . BACK SURGERY  1984  . BREAST BIOPSY  1988, 2008  . CESAREAN SECTION    . MYOMECTOMY  1988  . PLANTAR FASCIECTOMY  2013    There were no vitals filed for this visit.      Subjective Assessment - 02/28/17 1348    Subjective Pt reports that she was doing some of her R shoulder stretches on Friday and had some increase in pain and swelling afterwards. She is frustrated by her persistent pain and the fact that she is scheduled to return to work soon. She has a follow-up appointment with her orthopedic surgeon next week and is supposed to return to work the following day.    Limitations House hold activities;Lifting   Diagnostic tests Arthroscopic confirmation of large spurring, minimal tear of rotator cuff.    Patient Stated Goals To be able to use her RUE without pain again    Currently in Pain? Yes   Pain Score 8    Pain Location Shoulder   Pain Orientation Right   Pain Descriptors / Indicators  Aching;Throbbing   Pain Type Surgical pain   Pain Onset More than a month ago   Pain Frequency Constant           TREATMENT:   Manual Therapy Soft tissue mobilization to the anterior, middle, and posterior deltoids of RUE. Pt very guarded and painful;  Floppy fish mobilizations gradually moving R shoulder through PROM abduction, never through resistance and within pain tolerable range; R shoulder PROM into flexion, abduction, and ER, very limited range especially in ER due to pain. Pt is barely past 0 ER by end of stretching. Pt has a hard time relaxing. R shoulder AP mobilizations at neutral, grade I, 30s/bout x 3 bouts, increase in tenderness after pressure removed;  Supine AAROM canes for flexion and ER gradually progression ROM, pain at end range and significant guarding noted, improved ER with AAROM canes compared to PROM by therapist  E-stim IFC to R shoulder at pt tolerated intensity 9.4V with ice pack on R shoulder x 12 minutes;                          PT Education - 02/28/17 1352    Education provided Yes   Education Details HEP reinforced, discussed prognosis   Person(s)  Educated Patient   Methods Explanation   Comprehension Verbalized understanding             PT Long Term Goals - 02/10/17 1413      PT LONG TERM GOAL #1   Title Patient will demonstrate at least 160 degrees of active shoulder flexion to demonstrate improved tolerance for ADLs.    Time 8   Period Weeks   Status New   Target Date 04/07/17     PT LONG TERM GOAL #2   Title Patient will report QuickDash score of less than 15% disability to demonstrate improved tolerance for ADLs.    Time 8   Period Weeks   Status New   Target Date 04/07/17     PT LONG TERM GOAL #3   Title Patient will report worst pain of no more than 2/10 to demonstrate improved tolerance for ADLs.    Time 8   Period Weeks   Status New   Target Date 04/07/17               Plan -  02/28/17 1353    Clinical Impression Statement Pt is very guarded throughout session with high levels of pain and guarding. PROM/AAROM gradually improves but minimally as session progresses. Pt visibly frustrated by her persistent pain. Pt encouraged to continue current HEP and follow-up as scheduled. Advised pt never to push stretches through resistance.    Rehab Potential Good   PT Frequency 2x / week   PT Duration 6 weeks   PT Treatment/Interventions ADLs/Self Care Home Management;Aquatic Therapy;Moist Heat;Electrical Stimulation;Therapeutic activities;Therapeutic exercise;Balance training;Taping;Dry needling;Neuromuscular re-education   PT Next Visit Plan Progress pain management and soft tissue mobilization as needed.    PT Home Exercise Plan AAROM with flexion and ER ROM   Consulted and Agree with Plan of Care Patient      Patient will benefit from skilled therapeutic intervention in order to improve the following deficits and impairments:  Pain, Improper body mechanics, Decreased range of motion, Decreased strength, Impaired UE functional use, Decreased balance  Visit Diagnosis: Chronic right shoulder pain     Problem List Patient Active Problem List   Diagnosis Date Noted  . 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 Calculus of parotid gland duct 12/11/2015  . 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 Fibrocystic breast disease 01/03/2015  . Cervical radiculitis 01/03/2015  . Extreme obesity 01/03/2015  . Migraine with aura and without status migrainosus, not intractable 01/03/2015  . LA (lupus anticoagulant) disorder (HCC) 01/03/2015  . 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 Benign hypertension 01/03/2015  . Vitamin D deficiency 01/03/2015  . History of TB (tuberculosis) 01/03/2015  . Hives 01/03/2015  . History of pulmonary embolus (PE) 12/16/2014  . Thrombocytopenia (HCC) 07/01/2014  . Chronic right shoulder pain 04/04/2014  . Edema   . Sacral back pain  04/10/2012  . Chronic low back pain   . Iritis, chronic   . Allergic rhinitis   . Personal history of tuberculosis 10/26/2007   Lynnea Maizes PT, DPT   Huprich,Jason 02/28/2017, 2:27 PM  [place] [facility] PHYSICAL AND SPORTS MEDICINE [address], [place], [address] Phone: [phone]   Fax:  [phone]  Name: Chelsea Werner MRN: [id number] Date of Birth: 10-15-1955

## 2017-03-02 ENCOUNTER — Ambulatory Visit: Payer: Managed Care, Other (non HMO)

## 2017-03-02 DIAGNOSIS — M25511 Pain in right shoulder: Principal | ICD-10-CM

## 2017-03-02 DIAGNOSIS — G8929 Other chronic pain: Secondary | ICD-10-CM

## 2017-03-02 NOTE — Therapy (Signed)
[facility] [facility] [address] [place], [place], [address] Phone: [phone]   Fax:  [phone]  Physical Therapy Treatment  Patient Details  Name: Chelsea Werner MRN: [id number] Date of Birth: 1955-12-05 Referring Provider: Myles LippsMeiners  Encounter Date: 03/02/2017      PT End of Session - 03/02/17 1348    Visit Number 6   Number of Visits 13   Date for PT Re-Evaluation 04/14/17   PT Start Time 1345   PT Stop Time 1425   PT Time Calculation (min) 40 min   Activity Tolerance Patient tolerated treatment well   Behavior During Therapy [facility] for tasks assessed/performed      Past Medical History:  Diagnosis Date  . Allergic rhinitis   . Chronic low back pain   . History of blood transfusion   . History of chicken pox   . Hx of migraines   . Hyperlipidemia   . Iritis, chronic   . Positive TB test   . Thyroid disease     Past Surgical History:  Procedure Laterality Date  . ABDOMINAL HYSTERECTOMY  1999-2000  . BACK SURGERY  1984  . BREAST BIOPSY  1988, 2008  . CESAREAN SECTION    . MYOMECTOMY  1988  . PLANTAR FASCIECTOMY  2013    There were no vitals filed for this visit.      Subjective Assessment - 03/02/17 1346    Subjective Pt reports that she has been having a lot of soreness and pain since the last therapy session. She rates her pain as 7/10 currently. She called her surgeon's office who went ahead and extended her FMLA but did not move up her next appointment date. Pt is concerned and frustrated about continued L shoulder pain. She would like to know if she just needs to push through her pain.    Limitations House hold activities;Lifting   Diagnostic tests Arthroscopic confirmation of large spurring, minimal tear of rotator cuff.    Patient Stated Goals To be able to use her RUE without pain again    Currently in Pain? Yes   Pain Score 7    Pain Location Shoulder   Pain Orientation Right   Pain  Descriptors / Indicators Sharp   Pain Type Surgical pain   Pain Onset More than a month ago   Pain Frequency Constant           TREATMENT:   Manual Therapy  Supine AAROM canes for flexion and ER gradually progression ROM, pain at end range and significant guarding noted, cues for relaxation and to grip cane with RUE while it relaxes;  Attempted R shoulder AP mobilizations at neutral, grade I, however after 20s pt unable to tolerate. Pt is unable to tolerate very light pressure to R shoulder today; Soft tissue mobilization to the anterior, middle, and posterior deltoid as well upper trap of RUE.  Floppy fish mobilizations gradually moving R shoulder through PROM abduction, never through resistance and within pain tolerable range;  R shoulder PROM into flexion, abduction, and ER, very limited range due to pain. Pt has a hard time relaxing and reports high levels of pain.  E-stim  HiVolt to R anterior and posterior shoulder at pt directed locations of pain, 115 and 110V respectively which is pt tolerated intensity, ice pack on R shoulder during estim x 12 minutes;  PT Education - 03/02/17 1347    Education provided Yes   Education Details Exercise form/technique, prognosis and plan of care   Person(s) Educated Patient   Methods Explanation   Comprehension Verbalized understanding             PT Long Term Goals - 02/10/17 1413      PT LONG TERM GOAL #1   Title Patient will demonstrate at least 160 degrees of active shoulder flexion to demonstrate improved tolerance for ADLs.    Time 8   Period Weeks   Status New   Target Date 04/07/17     PT LONG TERM GOAL #2   Title Patient will report QuickDash score of less than 15% disability to demonstrate improved tolerance for ADLs.    Time 8   Period Weeks   Status New   Target Date 04/07/17     PT LONG TERM GOAL #3   Title Patient will report worst pain of no more than 2/10 to  demonstrate improved tolerance for ADLs.    Time 8   Period Weeks   Status New   Target Date 04/07/17               Plan - 03/02/17 1348    Clinical Impression Statement Pt remains extremely guarded throughout session reporting high levels of pain. She is frustrated by her continued pain with movement. Pt encouraged not to push through sharp pain or resistance with home program. Instructed pt to notify surgeon of her sharp pain and persistent swelling at her follow-up appointment and let him direct her regarding pain. Session is limited today due to high level of pain with very limited motion. Pt doesn't tolerate grade I mobilizations to shoulder today. Pt encouraged to continue HEP and follow-up as scheduled.    Clinical Presentation Unstable   Clinical Decision Making Moderate   Rehab Potential Good   PT Frequency 2x / week   PT Duration 6 weeks   PT Treatment/Interventions ADLs/Self Care Home Management;Aquatic Therapy;Moist Heat;Electrical Stimulation;Therapeutic activities;Therapeutic exercise;Balance training;Taping;Dry needling;Neuromuscular re-education   PT Next Visit Plan Progress pain management and soft tissue mobilization as needed.    PT Home Exercise Plan AAROM with flexion and ER ROM   Consulted and Agree with Plan of Care Patient      Patient will benefit from skilled therapeutic intervention in order to improve the following deficits and impairments:  Pain, Improper body mechanics, Decreased range of motion, Decreased strength, Impaired UE functional use, Decreased balance  Visit Diagnosis: Chronic right shoulder pain     Problem List Patient Active Problem List   Diagnosis Date Noted  . 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 Calculus of parotid gland duct 12/11/2015  . 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 Fibrocystic breast disease 01/03/2015  . Cervical radiculitis 01/03/2015  . Extreme obesity 01/03/2015  . Migraine with aura and without status  migrainosus, not intractable 01/03/2015  . LA (lupus anticoagulant) disorder (HCC) 01/03/2015  . 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 Benign hypertension 01/03/2015  . Vitamin D deficiency 01/03/2015  . History of TB (tuberculosis) 01/03/2015  . Hives 01/03/2015  . History of pulmonary embolus (PE) 12/16/2014  . Thrombocytopenia (HCC) 07/01/2014  . Chronic right shoulder pain 04/04/2014  . Edema   . Sacral back pain 04/10/2012  . Chronic low back pain   . Iritis, chronic   . Allergic rhinitis   . Personal history of tuberculosis 10/26/2007   Lynnea Maizes PT, DPT  Jalisia Puchalski 03/02/2017, 4:46 PM  [place] [facility] PHYS[facility] SPORTS MEDICINE [address] [place], [address] Phone: [phone]   Fax:  [phone]  Name: Chelsea Werner MRN: [id number] Date of Birth: 08-13-1955

## 2017-03-08 ENCOUNTER — Ambulatory Visit: Payer: Managed Care, Other (non HMO) | Attending: Foot and Ankle Surgery | Admitting: Physical Therapy

## 2017-03-08 DIAGNOSIS — G8929 Other chronic pain: Secondary | ICD-10-CM | POA: Diagnosis present

## 2017-03-08 DIAGNOSIS — M25511 Pain in right shoulder: Secondary | ICD-10-CM | POA: Insufficient documentation

## 2017-03-08 NOTE — Patient Instructions (Signed)
High volt stimulation and hot pack applied (to infraspinatus and posterior/middle deltoid) x 12 minutes--  Transitioned into soft tissue mobilization

## 2017-03-09 NOTE — Therapy (Signed)
[place] [address], [place], [address] Phone: [phone]   Fax:  [phone]  Physical Therapy Treatment  Patient Details  Name: Chelsea Werner MRN: [id number] Date of Birth: 1955-08-21 Referring Provider: Myles Lipps  Encounter Date: 03/08/2017      PT End of Session - 03/09/17 1036    Visit Number 7   Number of Visits 13   Date for PT Re-Evaluation 04/14/17   PT Start Time 1400   PT Stop Time 1445   PT Time Calculation (min) 45 min   Activity Tolerance Patient tolerated treatment well;Patient limited by pain   Behavior During Therapy [facility] for tasks assessed/performed      Past Medical History:  Diagnosis Date  . Allergic rhinitis   . Chronic low back pain   . History of blood transfusion   . History of chicken pox   . Hx of migraines   . Hyperlipidemia   . Iritis, chronic   . Positive TB test   . Thyroid disease     Past Surgical History:  Procedure Laterality Date  . ABDOMINAL HYSTERECTOMY  1999-2000  . BACK SURGERY  1984  . BREAST BIOPSY  1988, 2008  . CESAREAN SECTION    . MYOMECTOMY  1988  . PLANTAR FASCIECTOMY  2013    There were no vitals filed for this visit.      Subjective Assessment - 03/08/17 1404    Subjective Patient reports she began using a TENS unit which has been very helpful. She reports over the last few days her pain levels have not been the sharp throbs like last week. She has an appointment tomorrow with her surgeon.  She is continuing to have trouble with sleeping, though having a pillow between her arm and ribcage which has been very helpful but sleeping on the other arm has made it sore.    Limitations House hold activities;Lifting   Diagnostic tests Arthroscopic confirmation of large spurring, minimal tear of rotator cuff.    Patient Stated Goals To be able to use her RUE without pain again    Currently in Pain? Yes   Pain Score 6    Pain Location Shoulder   Pain Orientation Right   Pain Descriptors / Indicators Aching;Operative site guarding   Pain Type Surgical pain;Chronic pain   Pain Onset More than a month ago   Pain Frequency Constant      High volt stimulation and hot pack applied (to infraspinatus and posterior/middle deltoid) x 12 minutes-- (145V and 115V respectively)   Transitioned into soft tissue mobilization of infraspinatus/teres minor, pec minor, and middle/posterior deltoid with reported decrease in pain at rest to complete  Passive ER through tolerated ROM (very minimal so as not to increase her resting pain)  Supine AAROM with PVC into forward overhead elevation through tolerated low ROM (instructed not to increase symptoms)                            PT Education - 03/09/17 1035    Education provided Yes   Education Details Will continue to focus on increasing ROM as tolerated in light of working on pain control modalities.    Person(s) Educated Patient   Methods Explanation   Comprehension Verbalized understanding             PT Long Term Goals - 02/10/17 1413      PT LONG TERM  GOAL #1   Title Patient will demonstrate at least 160 degrees of active shoulder flexion to demonstrate improved tolerance for ADLs.    Time 8   Period Weeks   Status New   Target Date 04/07/17     PT LONG TERM GOAL #2   Title Patient will report QuickDash score of less than 15% disability to demonstrate improved tolerance for ADLs.    Time 8   Period Weeks   Status New   Target Date 04/07/17     PT LONG TERM GOAL #3   Title Patient will report worst pain of no more than 2/10 to demonstrate improved tolerance for ADLs.    Time 8   Period Weeks   Status New   Target Date 04/07/17               Plan - 03/09/17 1036    Clinical Impression Statement Patient continues to report high levels of pain (6-7/10) but without the throbbing sensation that she had last week. She is able to tolerate limited  AAROM and PROM this date with strong emphasis on pain control modalities initially. She will be progressed with ROM as tolerated in follow up sessions.    Clinical Presentation Evolving   Clinical Decision Making Moderate   Rehab Potential Good   PT Frequency 2x / week   PT Duration 6 weeks   PT Treatment/Interventions ADLs/Self Care Home Management;Aquatic Therapy;Moist Heat;Electrical Stimulation;Therapeutic activities;Therapeutic exercise;Balance training;Taping;Dry needling;Neuromuscular re-education   PT Next Visit Plan Progress pain management and soft tissue mobilization as needed.    PT Home Exercise Plan AAROM with flexion and ER ROM   Consulted and Agree with Plan of Care Patient      Patient will benefit from skilled therapeutic intervention in order to improve the following deficits and impairments:  Pain, Improper body mechanics, Decreased range of motion, Decreased strength, Impaired UE functional use, Decreased balance  Visit Diagnosis: Chronic right shoulder pain     Problem List Patient Active Problem List   Diagnosis Date Noted  . 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 Calculus of parotid gland duct 12/11/2015  . 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 Fibrocystic breast disease 01/03/2015  . Cervical radiculitis 01/03/2015  . Extreme obesity 01/03/2015  . Migraine with aura and without status migrainosus, not intractable 01/03/2015  . LA (lupus anticoagulant) disorder (HCC) 01/03/2015  . 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 Benign hypertension 01/03/2015  . Vitamin D deficiency 01/03/2015  . History of TB (tuberculosis) 01/03/2015  . Hives 01/03/2015  . History of pulmonary embolus (PE) 12/16/2014  . Thrombocytopenia (HCC) 07/01/2014  . Chronic right shoulder pain 04/04/2014  . Edema   . Sacral back pain 04/10/2012  . Chronic low back pain   . Iritis, chronic   . Allergic rhinitis   . Personal history of tuberculosis 10/26/2007   Alva GarnetPatrick  Micah Galeno PT, DPT, CSCS    03/09/2017, 10:39 AM  [facility] [address] [place], [place], [address] Phone: [phone]   Fax:  [phone]  Name: Chelsea Werner MRN: [id number] Date of Birth: 30-Oct-1955

## 2017-03-10 ENCOUNTER — Encounter: Payer: 59 | Admitting: Physical Therapy

## 2017-03-10 ENCOUNTER — Ambulatory Visit: Payer: Managed Care, Other (non HMO) | Admitting: Physical Therapy

## 2017-03-10 DIAGNOSIS — G8929 Other chronic pain: Secondary | ICD-10-CM

## 2017-03-10 DIAGNOSIS — M25511 Pain in right shoulder: Secondary | ICD-10-CM | POA: Diagnosis not present

## 2017-03-12 NOTE — Therapy (Signed)
[facility] [address] [place], [place], [address] Phone: [phone]   Fax:  [phone]  Physical Therapy Treatment  Patient Details  Name: Chelsea Werner MRN: [id number] Date of Birth: 29-Apr-1956 Referring Provider: Myles LippsMeiners  Encounter Date: 03/10/2017      PT End of Session - 03/12/17 2110    Visit Number 8   Number of Visits 13   Date for PT Re-Evaluation 04/14/17   PT Start Time 1745   PT Stop Time 1830   PT Time Calculation (min) 45 min   Activity Tolerance Patient tolerated treatment well;Patient limited by pain   Behavior During Therapy [facility] for tasks assessed/performed      Past Medical History:  Diagnosis Date  . Allergic rhinitis   . Chronic low back pain   . History of blood transfusion   . History of chicken pox   . Hx of migraines   . Hyperlipidemia   . Iritis, chronic   . Positive TB test   . Thyroid disease     Past Surgical History:  Procedure Laterality Date  . ABDOMINAL HYSTERECTOMY  1999-2000  . BACK SURGERY  1984  . BREAST BIOPSY  1988, 2008  . CESAREAN SECTION    . MYOMECTOMY  1988  . PLANTAR FASCIECTOMY  2013    There were no vitals filed for this visit.      Subjective Assessment - 03/12/17 2112    Subjective Patient saw her surgeon yesterday, she discussed her pain levels and her difficulty with sleeping.    Limitations House hold activities;Lifting   Diagnostic tests Arthroscopic confirmation of large spurring, minimal tear of rotator cuff.    Patient Stated Goals To be able to use her RUE without pain again    Currently in Pain? Yes   Pain Score 6    Pain Location Shoulder   Pain Orientation Right   Pain Descriptors / Indicators Aching   Pain Type Chronic pain;Surgical pain   Pain Onset More than a month ago   Pain Frequency Constant       Patient appeared to have some contribution of her symptoms from c-spine, performed long axis traction manually in supine  x 8 minutes with intermittent periods of relief provided. Patient reported relief from this  Performed grade I mobilizations in prone, from T5 through C2, patient reported the most discomfort from T2-C6, with relief of resting pain in shoulder and neck pain/stiffness reported after completion (performed 30-45" per bout for 3 bouts at each of the joints)  Performed PROM into ER x 4 bouts x 1-2 minutes per bout (better tolerated than previous sessions, no pain through the range, but very limited in range ~30 degrees)  Performed AAROM into flexion in supine with PVC pipe x 10 repetitions through limited pain range.   Performed isometric ER x 8 repetitions at submaximal intensity. (at 0 degrees of abduction.                             PT Education - 03/12/17 2111    Education provided Yes   Education Details Need to continue to work on progressing PROM ER for reduced pain.   Person(s) Educated Patient   Methods Explanation   Comprehension Verbalized understanding             PT Long Term Goals - 02/10/17 1413      PT LONG TERM GOAL #1  Title Patient will demonstrate at least 160 degrees of active shoulder flexion to demonstrate improved tolerance for ADLs.    Time 8   Period Weeks   Status New   Target Date 04/07/17     PT LONG TERM GOAL #2   Title Patient will report QuickDash score of less than 15% disability to demonstrate improved tolerance for ADLs.    Time 8   Period Weeks   Status New   Target Date 04/07/17     PT LONG TERM GOAL #3   Title Patient will report worst pain of no more than 2/10 to demonstrate improved tolerance for ADLs.    Time 8   Period Weeks   Status New   Target Date 04/07/17               Plan - 03/12/17 2113    Clinical Impression Statement Patient reports mild reduction in resting pain, she is still quite limited in function by pain and is struggling to tolerate PROM and AAROM. Provided modalities to allow for  some pain reduction followed by ROM and light isometrics this date to increase her ER ROM.    Clinical Presentation Evolving   Clinical Decision Making Moderate   Rehab Potential Fair   PT Frequency 2x / week   PT Duration 6 weeks   PT Treatment/Interventions ADLs/Self Care Home Management;Aquatic Therapy;Moist Heat;Electrical Stimulation;Therapeutic activities;Therapeutic exercise;Balance training;Taping;Dry needling;Neuromuscular re-education   PT Next Visit Plan Progress pain management and soft tissue mobilization as needed.    PT Home Exercise Plan AAROM with flexion and ER ROM   Consulted and Agree with Plan of Care Patient      Patient will benefit from skilled therapeutic intervention in order to improve the following deficits and impairments:  Pain, Improper body mechanics, Decreased range of motion, Decreased strength, Impaired UE functional use, Decreased balance  Visit Diagnosis: Chronic right shoulder pain     Problem List Patient Active Problem List   Diagnosis Date Noted  . 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 Calculus of parotid gland duct 12/11/2015  . 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 Fibrocystic breast disease 01/03/2015  . Cervical radiculitis 01/03/2015  . Extreme obesity 01/03/2015  . Migraine with aura and without status migrainosus, not intractable 01/03/2015  . LA (lupus anticoagulant) disorder (HCC) 01/03/2015  . 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 Benign hypertension 01/03/2015  . Vitamin D deficiency 01/03/2015  . History of TB (tuberculosis) 01/03/2015  . Hives 01/03/2015  . History of pulmonary embolus (PE) 12/16/2014  . Thrombocytopenia (HCC) 07/01/2014  . Chronic right shoulder pain 04/04/2014  . Edema   . Sacral back pain 04/10/2012  . Chronic low back pain   . Iritis, chronic   . Allergic rhinitis   . Personal history of tuberculosis 10/26/2007   Alva Garnet PT, DPT, CSCS    03/12/2017, 9:16 PM  [facility] SPORTS MEDICINE [address], [place], [address] Phone: [phone]   Fax:  [phone]  Name: Chelsea Werner MRN: [id number] Date of Birth: 1955/11/17

## 2017-03-16 ENCOUNTER — Ambulatory Visit: Payer: Managed Care, Other (non HMO)

## 2017-03-16 DIAGNOSIS — M25511 Pain in right shoulder: Secondary | ICD-10-CM | POA: Diagnosis not present

## 2017-03-16 DIAGNOSIS — G8929 Other chronic pain: Secondary | ICD-10-CM

## 2017-03-16 NOTE — Therapy (Signed)
[facility] [address] [place], [place], [address] Phone: [phone]   Fax:  [phone]  Physical Therapy Treatment  Patient Details  Name: Chelsea LivingsLeah S Werner MRN: [id number] Date of Birth: [birth year] Referring Provider: Myles LippsMeiners  Encounter Date: 03/16/2017      PT End of Session - 03/16/17 0811    Visit Number 9   Number of Visits 13   Date for PT Re-Evaluation 04/14/17   PT Start Time 0803   PT Stop Time 0845   PT Time Calculation (min) 42 min   Activity Tolerance Patient tolerated treatment well;Patient limited by pain   Behavior During Therapy [facility] for tasks assessed/performed      Past Medical History:  Diagnosis Date  . Allergic rhinitis   . Chronic low back pain   . History of blood transfusion   . History of chicken pox   . Hx of migraines   . Hyperlipidemia   . Iritis, chronic   . Positive TB test   . Thyroid disease     Past Surgical History:  Procedure Laterality Date  . ABDOMINAL HYSTERECTOMY  1999-2000  . BACK SURGERY  1984  . BREAST BIOPSY  1988, 2008  . CESAREAN SECTION    . MYOMECTOMY  1988  . PLANTAR FASCIECTOMY  2013    There were no vitals filed for this visit.      Subjective Assessment - 03/16/17 0801    Subjective Pt reports she is making very slow progress but it is "trending in the right direction." HEP is going well and pt would like to know if there are any exercises she can add to her program.    Limitations House hold activities;Lifting   Diagnostic tests Arthroscopic confirmation of large spurring, minimal tear of rotator cuff.    Patient Stated Goals To be able to use her RUE without pain again    Currently in Pain? Yes   Pain Score 5    Pain Location Shoulder   Pain Orientation Right   Pain Descriptors / Indicators Aching   Pain Type Chronic pain;Surgical pain   Pain Onset More than a month ago   Pain Frequency Constant   Pain Relieving Factors Icing               TREATMENT   Manual Therapy Supine AAROM canes for flexion and ER gradually progression ROM, pain at end range and decreased guarding noted compared to prior session, cues for relaxation; R shoulder PROM into flexion, abduction, and ER; R shoulder AP mobilizations at 80 abduction and end range ER, grade I, 30s/bout x 3 bouts while slowly stretching into more ER; Floppy fish mobilizations gradually moving R shoulder through PROM abduction, never through resistance and within pain tolerable range;  Distraction mobilizations progressing through PROM abduction below 90 degrees; Performed long axis cervical traction manually in supine x 8 minutes with intermittent periods of relief provided. Patient reported relief from this; Scapular mobilizations in sitting, elevation, retraction, and upward rotation x multiple bouts each; Isometric scapular elevation 5s hold x 5; Ice pack x 5 minutes (unbilled);                       PT Education - 03/16/17 0811    Education provided Yes   Education Details Continued ROM progression   Person(s) Educated Patient   Methods Explanation   Comprehension Verbalized understanding  PT Long Term Goals - 02/10/17 1413      PT LONG TERM GOAL #1   Title Patient will demonstrate at least 160 degrees of active shoulder flexion to demonstrate improved tolerance for ADLs.    Time 8   Period Weeks   Status New   Target Date 04/07/17     PT LONG TERM GOAL #2   Title Patient will report QuickDash score of less than 15% disability to demonstrate improved tolerance for ADLs.    Time 8   Period Weeks   Status New   Target Date 04/07/17     PT LONG TERM GOAL #3   Title Patient will report worst pain of no more than 2/10 to demonstrate improved tolerance for ADLs.    Time 8   Period Weeks   Status New   Target Date 04/07/17               Plan - 03/16/17 1610    Clinical Impression Statement Deferred  any HEP progression to primary therapist but agreed with current program which is just focusing on AAROM given patient's deficits and overall irritability of her pain. She demonstrates less guarding today compared to prior sessions and moves with less restriction. Pt encouraged to continue current program and followed up as scheduled.    Rehab Potential Fair   PT Frequency 2x / week   PT Duration 6 weeks   PT Treatment/Interventions ADLs/Self Care Home Management;Aquatic Therapy;Moist Heat;Electrical Stimulation;Therapeutic activities;Therapeutic exercise;Balance training;Taping;Dry needling;Neuromuscular re-education   PT Next Visit Plan Progress pain management and soft tissue mobilization as needed.    PT Home Exercise Plan AAROM with flexion and ER ROM   Consulted and Agree with Plan of Care Patient      Patient will benefit from skilled therapeutic intervention in order to improve the following deficits and impairments:  Pain, Improper body mechanics, Decreased range of motion, Decreased strength, Impaired UE functional use, Decreased balance  Visit Diagnosis: Chronic right shoulder pain     Problem List Patient Active Problem List   Diagnosis Date Noted  . 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 Calculus of parotid gland duct 12/11/2015  . 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 Fibrocystic breast disease 01/03/2015  . Cervical radiculitis 01/03/2015  . Extreme obesity 01/03/2015  . Migraine with aura and without status migrainosus, not intractable 01/03/2015  . LA (lupus anticoagulant) disorder (HCC) 01/03/2015  . 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 Benign hypertension 01/03/2015  . Vitamin D deficiency 01/03/2015  . History of TB (tuberculosis) 01/03/2015  . Hives 01/03/2015  . History of pulmonary embolus (PE) 12/16/2014  . Thrombocytopenia (HCC) 07/01/2014  . Chronic right shoulder pain 04/04/2014  . Edema   . Sacral back pain 04/10/2012  . Chronic low  back pain   . Iritis, chronic   . Allergic rhinitis   . Personal history of tuberculosis 10/26/2007    Lynnea Maizes PT, DPT   Rylen Hou 03/16/2017, 10:40 AM  [facility] PHYSICAL AND SPORTS MEDICINE [address] [place], [address] Phone: [phone]   Fax:  [phone]  Name: DANYAH GUASTELLA MRN: [id number] Date of Birth: [birth year]

## 2017-03-21 ENCOUNTER — Ambulatory Visit: Payer: Managed Care, Other (non HMO) | Admitting: Physical Therapy

## 2017-03-21 DIAGNOSIS — M25511 Pain in right shoulder: Principal | ICD-10-CM

## 2017-03-21 DIAGNOSIS — G8929 Other chronic pain: Secondary | ICD-10-CM

## 2017-03-21 NOTE — Therapy (Signed)
[facility] PHYS[facility]ORTS MEDICINE [address] [place], [address] Phone: [phone]   Fax:  [phone]  Physical Therapy Treatment  Patient Details  Name: Chelsea Werner MRN: [id number] Date of Birth: May 30, 1956 Referring Provider: Myles Lipps  Encounter Date: 03/21/2017      PT End of Session - 03/21/17 0921    Visit Number 10   Number of Visits 13   Date for PT Re-Evaluation 04/14/17   PT Start Time 0904   PT Stop Time 0944   PT Time Calculation (min) 40 min   Activity Tolerance Patient tolerated treatment well;Patient limited by pain   Behavior During Therapy [facility] for tasks assessed/performed      Past Medical History:  Diagnosis Date  . Allergic rhinitis   . Chronic low back pain   . History of blood transfusion   . History of chicken pox   . Hx of migraines   . Hyperlipidemia   . Iritis, chronic   . Positive TB test   . Thyroid disease     Past Surgical History:  Procedure Laterality Date  . ABDOMINAL HYSTERECTOMY  1999-2000  . BACK SURGERY  1984  . BREAST BIOPSY  1988, 2008  . CESAREAN SECTION    . MYOMECTOMY  1988  . PLANTAR FASCIECTOMY  2013    There were no vitals filed for this visit.      Subjective Assessment - 03/21/17 0912    Subjective Patient reports she is able to perform more functional activities and has regained more motion in her RUE, however she is still having a moderate to severe amount of pain in the arm.    Limitations House hold activities;Lifting   Diagnostic tests Arthroscopic confirmation of large spurring, minimal tear of rotator cuff.    Patient Stated Goals To be able to use her RUE without pain again    Currently in Pain? Yes  Moderate to sevre pain at rest in lateral and posterior portions of the deltoid.    Pain Orientation Right   Pain Descriptors / Indicators Aching   Pain Type Chronic pain;Surgical pain   Pain Onset More than a month ago   Pain Frequency Constant       PROM into ER for a total of 11 minutes with gradual increase in ROM, measured up to 45 degrees at roughly 70 degrees of abduction   AAROM in supine with maximum to 150 degrees (pain at end range, but no pain during the middle ranges of motion) x 10 repetition with PVC pipe   UE Ranger x 8 with proper mechanics, but patient reports she began developing lateral and posterior R shoulder pain   Wall spiders x 10 for 2 sets with contralateral assistance on the eccentric portion through tolerated ROM   Soft tissue mobilization for pain control over deltoid complex into biceps and long head of biceps with patient reporting decreased pain after completion.                            PT Education - 03/21/17 [phone]    Education provided Yes   Education Details Continue with passive ER in supine to increase PROM   Person(s) Educated Patient   Methods Explanation;Demonstration   Comprehension Verbalized understanding;Returned demonstration             PT Long Term Goals - 02/10/17 1413      PT LONG TERM GOAL #1  Title Patient will demonstrate at least 160 degrees of active shoulder flexion to demonstrate improved tolerance for ADLs.    Time 8   Period Weeks   Status New   Target Date 04/07/17     PT LONG TERM GOAL #2   Title Patient will report QuickDash score of less than 15% disability to demonstrate improved tolerance for ADLs.    Time 8   Period Weeks   Status New   Target Date 04/07/17     PT LONG TERM GOAL #3   Title Patient will report worst pain of no more than 2/10 to demonstrate improved tolerance for ADLs.    Time 8   Period Weeks   Status New   Target Date 04/07/17               Plan - 03/21/17 4098    Clinical Impression Statement Patient is progressing well with ER PROM up to 45 degrees and is noting reducing levels of pain and increasing function and motion with activities at home. She is still unable to tolerate much in the way  of active or active assisted motion into flexion, but appears to be more muscular in nature as she reports very quick and posiitive response to soft tissue mobilization.    Clinical Presentation Stable   Clinical Decision Making Moderate   Rehab Potential Fair   PT Frequency 2x / week   PT Duration 6 weeks   PT Treatment/Interventions ADLs/Self Care Home Management;Aquatic Therapy;Moist Heat;Electrical Stimulation;Therapeutic activities;Therapeutic exercise;Balance training;Taping;Dry needling;Neuromuscular re-education   PT Next Visit Plan Progress pain management and soft tissue mobilization as needed.    PT Home Exercise Plan AAROM with flexion and ER ROM   Consulted and Agree with Plan of Care Patient      Patient will benefit from skilled therapeutic intervention in order to improve the following deficits and impairments:  Pain, Improper body mechanics, Decreased range of motion, Decreased strength, Impaired UE functional use, Decreased balance  Visit Diagnosis: Chronic right shoulder pain     Problem List Patient Active Problem List   Diagnosis Date Noted  . 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 Calculus of parotid gland duct 12/11/2015  . 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 Fibrocystic breast disease 01/03/2015  . Cervical radiculitis 01/03/2015  . Extreme obesity 01/03/2015  . Migraine with aura and without status migrainosus, not intractable 01/03/2015  . LA (lupus anticoagulant) disorder (HCC) 01/03/2015  . 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 Benign hypertension 01/03/2015  . Vitamin D deficiency 01/03/2015  . History of TB (tuberculosis) 01/03/2015  . Hives 01/03/2015  . History of pulmonary embolus (PE) 12/16/2014  . Thrombocytopenia (HCC) 07/01/2014  . Chronic right shoulder pain 04/04/2014  . Edema   . Sacral back pain 04/10/2012  . Chronic low back pain   . Iritis, chronic   . Allergic rhinitis   . Personal history of tuberculosis  10/26/2007   Alva Garnet PT, DPT, CSCS    03/21/2017, 11:36 AM  [facility] PHYSICAL AND SPORTS MEDICINE [address], [place], [address] Phone: [phone]   Fax:  [phone]  Name: Chelsea Werner MRN: [id number] Date of Birth: Apr 07, 1956

## 2017-03-21 NOTE — Patient Instructions (Addendum)
PROM into ER for a total of 11 minutes with gradual increase in ROM, measured up to 45 degrees at roughly 70 degrees of abduction   AAROM in supine with maximum to 150 degrees (pain at end range, but no pain during the middle ranges of motion) x 10 repetition with PVC pipe   UE Ranger x 8 with proper mechanics, but patient reports she began developing lateral and posterior R shoulder pain   Wall spiders x 10 for 2 sets with contralateral assistance on the eccentric portion through tolerated ROM   Soft tissue mobilization for pain control over deltoid complex into biceps and long head of biceps with patient reporting decreased pain after completion.

## 2017-03-23 ENCOUNTER — Ambulatory Visit: Payer: Managed Care, Other (non HMO) | Admitting: Physical Therapy

## 2017-03-23 DIAGNOSIS — M25511 Pain in right shoulder: Secondary | ICD-10-CM | POA: Diagnosis not present

## 2017-03-23 DIAGNOSIS — G8929 Other chronic pain: Secondary | ICD-10-CM

## 2017-03-23 NOTE — Therapy (Signed)
[place] [facility] PHYSICAL [facility]ORTS MEDICINE [address], [place], [address] Phone: [phone]   Fax:  [phone]  Physical Therapy Treatment  Patient Details  Name: Chelsea Werner MRN: [id number] Date of Birth: [birth year] Referring Provider: Myles Lipps  Encounter Date: 03/23/2017      PT End of Session - 03/23/17 1520    Visit Number 11   Number of Visits 13   Date for PT Re-Evaluation 04/14/17   PT Start Time 1437   PT Stop Time 1517   PT Time Calculation (min) 40 min   Activity Tolerance Patient tolerated treatment well;Patient limited by pain   Behavior During Therapy [facility] for tasks assessed/performed      Past Medical History:  Diagnosis Date  . Allergic rhinitis   . Chronic low back pain   . History of blood transfusion   . History of chicken pox   . Hx of migraines   . Hyperlipidemia   . Iritis, chronic   . Positive TB test   . Thyroid disease     Past Surgical History:  Procedure Laterality Date  . ABDOMINAL HYSTERECTOMY  1999-2000  . BACK SURGERY  1984  . BREAST BIOPSY  1988, 2008  . CESAREAN SECTION    . MYOMECTOMY  1988  . PLANTAR FASCIECTOMY  2013    There were no vitals filed for this visit.      Subjective Assessment - 03/23/17 1439    Subjective Patient reports her pain levels are below where they were pre-op. She reports she tolerated the exercises last session well and did not have significant increase in pain afterwards.   Limitations House hold activities;Lifting   Diagnostic tests Arthroscopic confirmation of large spurring, minimal tear of rotator cuff.    Patient Stated Goals To be able to use her RUE without pain again    Currently in Pain? Yes  Reports moderate discomfort/pain in her R shoulder   Pain Location Shoulder   Pain Orientation Right;Anterior   Pain Descriptors / Indicators Aching   Pain Type Chronic pain;Surgical pain   Pain Onset More than a month ago      AROM to 123 degrees  of forward elevation   (Provided High volt stimulation over infraspinatus and teres minor throughout at 105V of High volt, well tolerated)   PROM multiple bouts initially to 48 degrees, with repetitions up to 52-53 degrees at 45 degrees of abduction   AAROM in supine with dowel to 155 degrees x 10  ER Isometrics (sub max) in neutral at 45 degrees of abduction x 8 for 5" holds x 3 sets   Seated ER with yellow t-band bilaterally x 15 for 3 sets -- cuing for appropriate technique to complete   Soft tissue mobilization over infraspinatus/teres minor, rhomboids, upper trapezius which was well tolerated, patient reported decrease in resting pain after completion                            PT Education - 03/23/17 1508    Education provided Yes   Education Details Excellent progress in ROM, provided bilateral ER with t-band for HEP    Person(s) Educated Patient   Methods Explanation;Demonstration;Handout   Comprehension Verbalized understanding;Returned demonstration             PT Long Term Goals - 02/10/17 1413      PT LONG TERM GOAL #1   Title Patient will demonstrate at least  160 degrees of active shoulder flexion to demonstrate improved tolerance for ADLs.    Time 8   Period Weeks   Status New   Target Date 04/07/17     PT LONG TERM GOAL #2   Title Patient will report QuickDash score of less than 15% disability to demonstrate improved tolerance for ADLs.    Time 8   Period Weeks   Status New   Target Date 04/07/17     PT LONG TERM GOAL #3   Title Patient will report worst pain of no more than 2/10 to demonstrate improved tolerance for ADLs.    Time 8   Period Weeks   Status New   Target Date 04/07/17               Plan - 03/23/17 1520    Clinical Impression Statement Patient continues to demonstrate progress with PROM into ER, up to nearly 55 degrees this date, and has noted improving function and pain reduction at home. She still lacks  full AROM secondary to weakness and pain, but this has improved significantly from previous weeks as well. She is able to tolerate increased progressions of ROM and strengthening activities, and will continue to be progressed to full ROM and then strengthening activities for full return to ADLs.    Clinical Presentation Stable   Clinical Decision Making Moderate   Rehab Potential Fair   PT Frequency 2x / week   PT Duration 6 weeks   PT Treatment/Interventions ADLs/Self Care Home Management;Aquatic Therapy;Moist Heat;Electrical Stimulation;Therapeutic activities;Therapeutic exercise;Balance training;Taping;Dry needling;Neuromuscular re-education   PT Next Visit Plan Progress pain management and soft tissue mobilization as needed.    PT Home Exercise Plan AAROM with flexion and ER ROM   Consulted and Agree with Plan of Care Patient      Patient will benefit from skilled therapeutic intervention in order to improve the following deficits and impairments:  Pain, Improper body mechanics, Decreased range of motion, Decreased strength, Impaired UE functional use, Decreased balance  Visit Diagnosis: Chronic right shoulder pain     Problem List Patient Active Problem List   Diagnosis Date Noted  . 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 Calculus of parotid gland duct 12/11/2015  . 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 Fibrocystic breast disease 01/03/2015  . Cervical radiculitis 01/03/2015  . Extreme obesity 01/03/2015  . Migraine with aura and without status migrainosus, not intractable 01/03/2015  . LA (lupus anticoagulant) disorder (HCC) 01/03/2015  . 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 Benign hypertension 01/03/2015  . Vitamin D deficiency 01/03/2015  . History of TB (tuberculosis) 01/03/2015  . Hives 01/03/2015  . History of pulmonary embolus (PE) 12/16/2014  . Thrombocytopenia (HCC) 07/01/2014  . Chronic right shoulder pain 04/04/2014  . Edema   . Sacral back pain  04/10/2012  . Chronic low back pain   . Iritis, chronic   . Allergic rhinitis   . Personal history of tuberculosis 10/26/2007   Alva Garnet PT, DPT, CSCS     03/23/2017, 3:22 PM  [place] [facility] PHYSICAL AND SPORTS MEDICINE [address], [place], [address] Phone: [phone]   Fax:  [phone]  Name: MEDORA ROORDA MRN: [id number] Date of Birth: [birth year]

## 2017-03-23 NOTE — Patient Instructions (Signed)
AROM to 123 degrees of forward elevation   PROM multiple bouts initially to 48 degrees, with repetitions up to 52-53 degrees at 45 degrees of abduction   AAROM in supine with dowel to 155 degrees x 10  ER Isometrics (sub max) in neutral at 45 degrees of abduction x 8 for 5" holds x 3 sets   Seated ER with yellow t-band bilaterally x 15 for 3 sets

## 2017-03-30 ENCOUNTER — Ambulatory Visit: Payer: Managed Care, Other (non HMO) | Admitting: Physical Therapy

## 2017-03-30 DIAGNOSIS — M25511 Pain in right shoulder: Secondary | ICD-10-CM | POA: Diagnosis not present

## 2017-03-30 DIAGNOSIS — G8929 Other chronic pain: Secondary | ICD-10-CM

## 2017-03-30 NOTE — Patient Instructions (Addendum)
Cervical flexion - more of a stretch feeling   Cervical extension - painful   Cervical rotation R - painful an dlimited, to the L not painful   Spurling's test - positive and reproduced some of her shoulder pain   AROM shoulder to 131 degrees   T4-T1 mobilizations grade I x 30" for 4 bouts throughout this portion, x 30" for assessment in C-spine and T-spine lower than T4  Soft tissue mobilization over rhomboids   Cervical traction   PROM into ER x 2 minutes -- well tolerated   AROM to complete session to 154 degrees   For HEP - seated thoracic extensions.

## 2017-03-31 NOTE — Therapy (Signed)
[place] [facility] PHYS[facility] SPORTS MEDICINE [address] [place], [address] Phone: [phone]   Fax:  [phone]  Physical Therapy Treatment  Patient Details  Name: Chelsea Werner MRN: [id number] Date of Birth: 07-25-1955 Referring Provider: Myles Lipps  Encounter Date: 03/30/2017      PT End of Session - 03/30/17 1455    Visit Number 12   Number of Visits 13   Date for PT Re-Evaluation 04/14/17   PT Start Time 1436   PT Stop Time 1515   PT Time Calculation (min) 39 min   Activity Tolerance Patient tolerated treatment well;Patient limited by pain   Behavior During Therapy [facility] for tasks assessed/performed      Past Medical History:  Diagnosis Date  . Allergic rhinitis   . Chronic low back pain   . History of blood transfusion   . History of chicken pox   . Hx of migraines   . Hyperlipidemia   . Iritis, chronic   . Positive TB test   . Thyroid disease     Past Surgical History:  Procedure Laterality Date  . ABDOMINAL HYSTERECTOMY  1999-2000  . BACK SURGERY  1984  . BREAST BIOPSY  1988, 2008  . CESAREAN SECTION    . MYOMECTOMY  1988  . PLANTAR FASCIECTOMY  2013    There were no vitals filed for this visit.      Subjective Assessment - 03/30/17 1436    Subjective Patient reports her pain is still constant, she is having problems sleeping at night. She reports staying in one position for any length of time (1-2 hours) and she is in a lot of pain. She is able to move her arm more, and her pain is below where it was before the surgery (that week) but back down to her baseline pain    Limitations House hold activities;Lifting   Diagnostic tests Arthroscopic confirmation of large spurring, minimal tear of rotator cuff.    Patient Stated Goals To be able to use her RUE without pain again    Currently in Pain? Yes   Pain Score 5    Pain Location Shoulder   Pain Orientation Right;Anterior   Pain Descriptors / Indicators Aching   Pain Type Chronic pain;Surgical pain   Pain Onset More than a month ago   Pain Frequency Constant      Cervical flexion - more of a stretch feeling   Cervical extension - painful   Cervical rotation R - painful an dlimited, to the L not painful   Spurling's test - positive and reproduced some of her shoulder pain   AROM shoulder to 131 degrees   T4-T1 mobilizations grade I x 30" for 4 bouts throughout this portion, x 30" for assessment in C-spine and T-spine lower than T4  Soft tissue mobilization over rhomboids -- patient reported tender initially, which reduced with repetition/exposure  Cervical traction x 5 bouts x 45-60" per bout with patient reporting this felt relieving and beneficial for her   PROM into ER x 2 minutes -- well tolerated   AROM to complete session to 154 degrees   For HEP - seated thoracic extensions.                            PT Education - 03/30/17 1454    Education provided Yes   Education Details Will continue treating her neck as this significantly increased her AROM.  Person(s) Educated Patient   Methods Explanation;Demonstration   Comprehension Verbalized understanding;Returned demonstration             PT Long Term Goals - 02/10/17 1413      PT LONG TERM GOAL #1   Title Patient will demonstrate at least 160 degrees of active shoulder flexion to demonstrate improved tolerance for ADLs.    Time 8   Period Weeks   Status New   Target Date 04/07/17     PT LONG TERM GOAL #2   Title Patient will report QuickDash score of less than 15% disability to demonstrate improved tolerance for ADLs.    Time 8   Period Weeks   Status New   Target Date 04/07/17     PT LONG TERM GOAL #3   Title Patient will report worst pain of no more than 2/10 to demonstrate improved tolerance for ADLs.    Time 8   Period Weeks   Status New   Target Date 04/07/17               Plan - 03/30/17 1513    Clinical Impression  Statement Patient was able to gain a substantial amount of AROM into flexion with her RUE with increased pain control after PT treated the neck with joint mobilizations and STM this date. She has made good progress, but is still quite limited with ER PROM.Marland Kitchen She is gradually regaining function, though it appears treatment of cervical and thoracic spine is indicated to reduce her pain symptoms.    Clinical Presentation Stable   Clinical Decision Making Moderate   Rehab Potential Fair   PT Frequency 2x / week   PT Duration 6 weeks   PT Treatment/Interventions ADLs/Self Care Home Management;Aquatic Therapy;Moist Heat;Electrical Stimulation;Therapeutic activities;Therapeutic exercise;Balance training;Taping;Dry needling;Neuromuscular re-education   PT Next Visit Plan Progress pain management and soft tissue mobilization as needed.    PT Home Exercise Plan AAROM with flexion and ER ROM   Consulted and Agree with Plan of Care Patient      Patient will benefit from skilled therapeutic intervention in order to improve the following deficits and impairments:  Pain, Improper body mechanics, Decreased range of motion, Decreased strength, Impaired UE functional use, Decreased balance  Visit Diagnosis: Chronic right shoulder pain     Problem List Patient Active Problem List   Diagnosis Date Noted  . 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 Calculus of parotid gland duct 12/11/2015  . 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 Fibrocystic breast disease 01/03/2015  . Cervical radiculitis 01/03/2015  . Extreme obesity 01/03/2015  . Migraine with aura and without status migrainosus, not intractable 01/03/2015  . LA (lupus anticoagulant) disorder (HCC) 01/03/2015  . 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 Benign hypertension 01/03/2015  . Vitamin D deficiency 01/03/2015  . History of TB (tuberculosis) 01/03/2015  . Hives 01/03/2015  . History of pulmonary embolus (PE) 12/16/2014  .  Thrombocytopenia (HCC) 07/01/2014  . Chronic right shoulder pain 04/04/2014  . Edema   . Sacral back pain 04/10/2012  . Chronic low back pain   . Iritis, chronic   . Allergic rhinitis   . Personal history of tuberculosis 10/26/2007   Alva Garnet PT, DPT, CSCS    03/31/2017, 9:05 AM  [place] PHYSICAL AND SPORTS MEDICINE [address], [place], [address] Phone: [phone]   Fax:  [phone]  Name: Chelsea Werner MRN: [id number] Date of Birth: 1955/12/09

## 2017-04-01 ENCOUNTER — Ambulatory Visit: Payer: Managed Care, Other (non HMO) | Admitting: Physical Therapy

## 2017-04-01 DIAGNOSIS — G8929 Other chronic pain: Secondary | ICD-10-CM

## 2017-04-01 DIAGNOSIS — M25511 Pain in right shoulder: Principal | ICD-10-CM

## 2017-04-01 NOTE — Patient Instructions (Addendum)
AROM to 147 degrees    Quick Dash score of 77.3%

## 2017-04-01 NOTE — Therapy (Signed)
[facility] [address] [place], [address] Phone: [phone]   Fax:  [phone]  Physical Therapy Treatment  Patient Details  Name: Chelsea Werner MRN: [id number] Date of Birth: 04/08/1956 Referring Provider: Rosaland Lao  Encounter Date: 04/01/2017      PT End of Session - 04/01/17 1212    Visit Number 13   Number of Visits 21   Date for PT Re-Evaluation 05/13/17   PT Start Time 1135   PT Stop Time 1215   PT Time Calculation (min) 40 min   Activity Tolerance Patient tolerated treatment well;Patient limited by pain   Behavior During Therapy [facility] for tasks assessed/performed      Past Medical History:  Diagnosis Date  . Allergic rhinitis   . Chronic low back pain   . History of blood transfusion   . History of chicken pox   . Hx of migraines   . Hyperlipidemia   . Iritis, chronic   . Positive TB test   . Thyroid disease     Past Surgical History:  Procedure Laterality Date  . ABDOMINAL HYSTERECTOMY  1999-2000  . BACK SURGERY  1984  . BREAST BIOPSY  1988, 2008  . CESAREAN SECTION    . MYOMECTOMY  1988  . PLANTAR FASCIECTOMY  2013    There were no vitals filed for this visit.      Subjective Assessment - 04/01/17 1137    Subjective Patient reports her pain is still continuous, but that she has had noticeable decrease in her pain (1-2 points from normal she states).    Limitations House hold activities;Lifting   Diagnostic tests Arthroscopic confirmation of large spurring, minimal tear of rotator cuff.    Patient Stated Goals To be able to use her RUE without pain again    Currently in Pain? Yes   Pain Score 5    Pain Location Shoulder   Pain Orientation Right;Anterior   Pain Descriptors / Indicators Aching   Pain Type Chronic pain;Surgical pain   Pain Onset More than a month ago   Pain Frequency Constant      AROM to 147 degrees  Performed grade I-II mobilizations x 5 bouts from  T4-C7 x 30-45" at each level with positive response, and much less tenderness than in previous session. Performed 2 bouts x 30" at each level from C7-C3 after completion, minimal to no discomfort noted at any level   Soft tissue mobilization through rhomboids, upper trapezius, levator scapulae, and infraspinatus with tenderness and trigger points noted in each. She responded well, and reported decreased tenderness after gradual exposure in each area.   Supine manual traction x 8 minutes with patient reporting significant relief and alleviation of her resting symptoms, reports her R arm was "not that bad" after and during treatment    Quick Dash score of 77.3% AROM to 140-145 degrees to complete session                            PT Education - 04/01/17 1217    Education provided Yes   Education Details That she may benefit from thoracic manipulation, will communicate at follow up with MD.    Terence Lux) Educated Patient   Methods Explanation;Demonstration   Comprehension Verbalized understanding;Returned demonstration             PT Long Term Goals - 04/01/17 1212      PT LONG TERM  GOAL #1   Title Patient will demonstrate at least 160 degrees of active shoulder flexion to demonstrate improved tolerance for ADLs.    Baseline Up to 147 degrees on 04/01/2017   Time 8   Period Weeks   Status Partially Met     PT LONG TERM GOAL #2   Title Patient will report QuickDash score of less than 15% disability to demonstrate improved tolerance for ADLs.    Time 8   Period Weeks   Status Partially Met     PT LONG TERM GOAL #3   Title Patient will report worst pain of no more than 2/10 to demonstrate improved tolerance for ADLs.    Baseline Patient reports 4-6/10 pain most days recently.    Time 8   Period Weeks   Status Partially Met               Plan - 04/01/17 1215    Clinical Impression Statement Patient has responded quite well to manual therapy  directed at cervical spine and reports decreased resting pain over the last few days and even more so today. She is able to lift her arm in forward elevation direction up to 147 degrees, up from 123 degrees 2 weeks ago. She would likely benefit from thoracic manipulation, will communicate with her orthopedist as she has folow up on Wednesday of next week.    Clinical Decision Making Moderate   Rehab Potential Fair   PT Frequency 2x / week   PT Duration 6 weeks   PT Treatment/Interventions ADLs/Self Care Home Management;Aquatic Therapy;Moist Heat;Electrical Stimulation;Therapeutic activities;Therapeutic exercise;Balance training;Taping;Dry needling;Neuromuscular re-education   PT Next Visit Plan Progress pain management and soft tissue mobilization as needed.    PT Home Exercise Plan AAROM with flexion and ER ROM   Consulted and Agree with Plan of Care Patient      Patient will benefit from skilled therapeutic intervention in order to improve the following deficits and impairments:  Pain, Improper body mechanics, Decreased range of motion, Decreased strength, Impaired UE functional use, Decreased balance  Visit Diagnosis: Chronic right shoulder pain     Problem List Patient Active Problem List   Diagnosis Date Noted  . 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 Calculus of parotid gland duct 12/11/2015  . 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 Fibrocystic breast disease 01/03/2015  . Cervical radiculitis 01/03/2015  . Extreme obesity 01/03/2015  . Migraine with aura and without status migrainosus, not intractable 01/03/2015  . LA (lupus anticoagulant) disorder ([place]) 01/03/2015  . 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 Benign hypertension 01/03/2015  . Vitamin D deficiency 01/03/2015  . History of TB (tuberculosis) 01/03/2015  . Hives 01/03/2015  . History of pulmonary embolus (PE) 12/16/2014  . Thrombocytopenia ([place]) 07/01/2014  . Chronic right shoulder pain 04/04/2014   . Edema   . Sacral back pain 04/10/2012  . Chronic low back pain   . Iritis, chronic   . Allergic rhinitis   . Personal history of tuberculosis 10/26/2007   Royce Macadamia PT, DPT, CSCS    04/01/2017, 12:21 PM  [facility] [address] [place], [address] Phone: [phone]   Fax:  [phone]  Name: RAEGHAN DEMETER MRN: [id number] Date of Birth: 04/26/1956

## 2017-04-04 ENCOUNTER — Ambulatory Visit: Payer: Managed Care, Other (non HMO) | Attending: Foot and Ankle Surgery | Admitting: Physical Therapy

## 2017-04-04 DIAGNOSIS — M25511 Pain in right shoulder: Secondary | ICD-10-CM | POA: Insufficient documentation

## 2017-04-04 DIAGNOSIS — G8929 Other chronic pain: Secondary | ICD-10-CM | POA: Diagnosis present

## 2017-04-04 NOTE — Patient Instructions (Signed)
Bilateral ER with yellow t-band x 15 for 3 sets   Seated ER at 65 degrees abduction in scapular plane   Cervical manual traction followed up with soft tissue mobilization    Wrist extension stretching with trigger point release  Passive ER overpressure and stretching to 50 degrees with no sharp increase in pain   AROM to 145 degrees with minimal discomfort noted   Towel internal rotation and extension/adduction stretch as patient reports she would like to increase this ROM even if it becomes more painful for her.

## 2017-04-05 MED ORDER — darifenacin (ENABLEX) 7.5 MG 24 hr tablet
7.5 | ORAL_TABLET | ORAL | 0 refills | 30.00000 days | Status: AC
Start: 2017-04-05 — End: 2017-05-05

## 2017-04-05 NOTE — Therapy (Signed)
[facility] [address] [place], [address] Phone: [phone]   Fax:  [phone]  Physical Therapy Treatment  Patient Details  Name: Chelsea Werner MRN: [id number] Date of Birth: Dec 19, 1955 Referring Provider: Rosaland Lao  Encounter Date: 04/04/2017      PT End of Session - 04/04/17 1415    Visit Number 14   Number of Visits 21   Date for PT Re-Evaluation 05/13/17   PT Start Time 1352   PT Stop Time 1432   PT Time Calculation (min) 40 min   Activity Tolerance Patient tolerated treatment well;Patient limited by pain   Behavior During Therapy [facility] for tasks assessed/performed      Past Medical History:  Diagnosis Date  . Allergic rhinitis   . Chronic low back pain   . History of blood transfusion   . History of chicken pox   . Hx of migraines   . Hyperlipidemia   . Iritis, chronic   . Positive TB test   . Thyroid disease     Past Surgical History:  Procedure Laterality Date  . ABDOMINAL HYSTERECTOMY  1999-2000  . BACK SURGERY  1984  . BREAST BIOPSY  1988, 2008  . CESAREAN SECTION    . MYOMECTOMY  1988  . PLANTAR FASCIECTOMY  2013    There were no vitals filed for this visit.      Subjective Assessment - 04/04/17 1354    Subjective Patient reports her neck and low back were aching after her previous session, she said she just really has a tough time with laying on her stomach, and believes perhaps the manual therapy may have aggravated her neck. She would like to focu son the posterior portion of her shoulder as this has been keeping her up at night.    Limitations House hold activities;Lifting   Diagnostic tests Arthroscopic confirmation of large spurring, minimal tear of rotator cuff.    Patient Stated Goals To be able to use her RUE without pain again    Currently in Pain? Yes   Pain Location Neck   Pain Orientation Lower   Pain Descriptors / Indicators Aching   Pain Type Surgical  pain;Chronic pain   Pain Onset More than a month ago   Pain Frequency Constant        Bilateral ER with yellow t-band x 15 for 3 sets   Seated ER at 65 degrees abduction in scapular plane actively with elbow supported by table, appropriate activation noted.   Cervical manual traction followed up with soft tissue mobilization x 15 minutes total which patient reported reduced some of her symptoms.    Wrist extension stretching with trigger point release as patient was getting some sharp pain in her wrist extensors, reported this alleviated her symptoms.   Passive ER overpressure and stretching to 50 degrees with no sharp increase in pain   AROM to 145 degrees with minimal discomfort noted   Towel internal rotation and extension/adduction stretch as patient reports she would like to increase this ROM even if it becomes more painful for her.                           PT Education - 04/05/17 1439    Education provided Yes   Education Details Will continue to work on cervical traction and strengthening as tolerated.    Person(s) Educated Patient   Methods Explanation;Demonstration   Comprehension Verbalized  understanding;Returned demonstration             PT Long Term Goals - 04/01/17 1212      PT LONG TERM GOAL #1   Title Patient will demonstrate at least 160 degrees of active shoulder flexion to demonstrate improved tolerance for ADLs.    Baseline Up to 147 degrees on 04/01/2017   Time 8   Period Weeks   Status Partially Met     PT LONG TERM GOAL #2   Title Patient will report QuickDash score of less than 15% disability to demonstrate improved tolerance for ADLs.    Time 8   Period Weeks   Status Partially Met     PT LONG TERM GOAL #3   Title Patient will report worst pain of no more than 2/10 to demonstrate improved tolerance for ADLs.    Baseline Patient reports 4-6/10 pain most days recently.    Time 8   Period Weeks   Status Partially Met                Plan - 04/04/17 1436    Clinical Impression Statement Patient is unable to tolerate lying in prone, thus will need to direct all treatments in supine. Her AROM has increased substantially from 2-3 weeks ago, and is able to complete strengthening program this date without increase in complaints. She will be seeing her surgeon next week before returning to work.    Clinical Presentation Stable   Clinical Decision Making Moderate   Rehab Potential Fair   PT Frequency 2x / week   PT Duration 6 weeks   PT Treatment/Interventions ADLs/Self Care Home Management;Aquatic Therapy;Moist Heat;Electrical Stimulation;Therapeutic activities;Therapeutic exercise;Balance training;Taping;Dry needling;Neuromuscular re-education   PT Next Visit Plan Progress pain management and soft tissue mobilization as needed.    PT Home Exercise Plan AAROM with flexion and ER ROM   Consulted and Agree with Plan of Care Patient      Patient will benefit from skilled therapeutic intervention in order to improve the following deficits and impairments:  Pain, Improper body mechanics, Decreased range of motion, Decreased strength, Impaired UE functional use, Decreased balance  Visit Diagnosis: Chronic right shoulder pain     Problem List Patient Active Problem List   Diagnosis Date Noted  . 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 Calculus of parotid gland duct 12/11/2015  . 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 Fibrocystic breast disease 01/03/2015  . Cervical radiculitis 01/03/2015  . Extreme obesity 01/03/2015  . Migraine with aura and without status migrainosus, not intractable 01/03/2015  . LA (lupus anticoagulant) disorder ([place]) 01/03/2015  . 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 Benign hypertension 01/03/2015  . Vitamin D deficiency 01/03/2015  . History of TB (tuberculosis) 01/03/2015  . Hives 01/03/2015  . History of pulmonary embolus (PE) 12/16/2014  . Thrombocytopenia  ([place]) 07/01/2014  . Chronic right shoulder pain 04/04/2014  . Edema   . Sacral back pain 04/10/2012  . Chronic low back pain   . Iritis, chronic   . Allergic rhinitis   . Personal history of tuberculosis 10/26/2007   Royce Macadamia PT, DPT, CSCS    04/05/2017, 2:44 PM  [facility] [address], [place], [address] Phone: [phone]   Fax:  [phone]  Name: Chelsea Werner MRN: [id number] Date of Birth: October 27, 1955

## 2017-04-06 ENCOUNTER — Ambulatory Visit: Payer: Managed Care, Other (non HMO) | Admitting: Physical Therapy

## 2017-04-06 DIAGNOSIS — M25511 Pain in right shoulder: Principal | ICD-10-CM

## 2017-04-06 DIAGNOSIS — G8929 Other chronic pain: Secondary | ICD-10-CM

## 2017-04-06 NOTE — Patient Instructions (Addendum)
[place]  [place]   ([facility])   Resting pain is right around a 5, night time is worse.  AROM to 142 degrees of flexion   56 degrees PROM into ER

## 2017-04-08 NOTE — Therapy (Signed)
[facility] [address] [place], [address] Phone: [phone]   Fax:  [phone]  Physical Therapy Treatment  Patient Details  Name: Chelsea Werner MRN: [id number] Date of Birth: 02-11-1956 Referring Provider: Rosaland Lao  Encounter Date: 04/06/2017      PT End of Session - 04/08/17 0852    Visit Number 15   Number of Visits 21   Date for PT Re-Evaluation 05/13/17   PT Start Time 1622   PT Stop Time 1702   PT Time Calculation (min) 40 min   Activity Tolerance Patient tolerated treatment well;Patient limited by pain   Behavior During Therapy [facility] for tasks assessed/performed      Past Medical History:  Diagnosis Date  . Allergic rhinitis   . Chronic low back pain   . History of blood transfusion   . History of chicken pox   . Hx of migraines   . Hyperlipidemia   . Iritis, chronic   . Positive TB test   . Thyroid disease     Past Surgical History:  Procedure Laterality Date  . ABDOMINAL HYSTERECTOMY  1999-2000  . BACK SURGERY  1984  . BREAST BIOPSY  1988, 2008  . CESAREAN SECTION    . MYOMECTOMY  1988  . PLANTAR FASCIECTOMY  2013    There were no vitals filed for this visit.      Subjective Assessment - 04/08/17 0847    Subjective Patient reports she saw her surgeon, she will be returning to work next week and would like her care transferred to a [facility] clinic as she reports it will be more convenient     Limitations House hold activities;Lifting   Diagnostic tests Arthroscopic confirmation of large spurring, minimal tear of rotator cuff.    Patient Stated Goals To be able to use her RUE without pain again    Currently in Pain? Yes   Pain Score 5    Pain Location Shoulder   Pain Orientation Posterior;Right   Pain Descriptors / Indicators Aching   Pain Type Chronic pain;Surgical pain   Pain Onset More than a month ago   Pain Frequency Constant       Resting pain is right  around a 5, night time is worse.  AROM to 142 degrees of flexion   56 degrees PROM into ER -- performed PROM x 2 bouts x 3-5 minutes to tolerance with noticeable increase ROM from previous sessions with decreased pain reported   Manual cervical traction completed x 4 bouts x 2-3 minutes to tolerance, provided sub-occipital release to provide added relief. Patient reported relief of symptoms she was presenting with, mild decrease in pain with roughly similar AROM.      DIscussed need to complete supine passive ER mobilizations with PVC still, educated on completing bilateraly yellow t-band ER in sitting.   Patient works in [employer] and would like her care transferred to a provider closer to work, will update if PT can find a good fit for her.                         PT Education - 04/08/17 [id number]    Education provided Yes   Education Details Will look into transfer of care options and discuss with new therapist.    Person(s) Educated Patient   Methods Explanation   Comprehension Verbalized understanding  PT Long Term Goals - 04/01/17 1212      PT LONG TERM GOAL #1   Title Patient will demonstrate at least 160 degrees of active shoulder flexion to demonstrate improved tolerance for ADLs.    Baseline Up to 147 degrees on 04/01/2017   Time 8   Period Weeks   Status Partially Met     PT LONG TERM GOAL #2   Title Patient will report QuickDash score of less than 15% disability to demonstrate improved tolerance for ADLs.    Time 8   Period Weeks   Status Partially Met     PT LONG TERM GOAL #3   Title Patient will report worst pain of no more than 2/10 to demonstrate improved tolerance for ADLs.    Baseline Patient reports 4-6/10 pain most days recently.    Time 8   Period Weeks   Status Partially Met               Plan - 04/08/17 7654    Clinical Impression Statement Since treatment has shifted to focus on cervical traction  patient has had excellent response to treatment. She has had increase in AROM with decrease in pain levels most days. She is returning to work, and would like to have her care transferred to a clinic closer to her work. PT will explore best options for her and discuss with her next therapist on beneficial treatments and progress thus far.    Clinical Presentation Stable   Clinical Decision Making Moderate   Rehab Potential Fair   PT Frequency 2x / week   PT Duration 6 weeks   PT Treatment/Interventions ADLs/Self Care Home Management;Aquatic Therapy;Moist Heat;Electrical Stimulation;Therapeutic activities;Therapeutic exercise;Balance training;Taping;Dry needling;Neuromuscular re-education   PT Next Visit Plan Progress pain management and soft tissue mobilization as needed.    PT Home Exercise Plan AAROM with flexion and ER ROM   Consulted and Agree with Plan of Care Patient      Patient will benefit from skilled therapeutic intervention in order to improve the following deficits and impairments:  Pain, Improper body mechanics, Decreased range of motion, Decreased strength, Impaired UE functional use, Decreased balance  Visit Diagnosis: Chronic right shoulder pain     Problem List Patient Active Problem List   Diagnosis Date Noted  . 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 Calculus of parotid gland duct 12/11/2015  . 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 Fibrocystic breast disease 01/03/2015  . Cervical radiculitis 01/03/2015  . Extreme obesity 01/03/2015  . Migraine with aura and without status migrainosus, not intractable 01/03/2015  . LA (lupus anticoagulant) disorder ([place]) 01/03/2015  . 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 Benign hypertension 01/03/2015  . Vitamin D deficiency 01/03/2015  . History of TB (tuberculosis) 01/03/2015  . Hives 01/03/2015  . History of pulmonary embolus (PE) 12/16/2014  . Thrombocytopenia ([place]) 07/01/2014  . Chronic right shoulder  pain 04/04/2014  . Edema   . Sacral back pain 04/10/2012  . Chronic low back pain   . Iritis, chronic   . Allergic rhinitis   . Personal history of tuberculosis 10/26/2007   Royce Macadamia PT, DPT, CSCS    04/08/2017, 8:55 AM  [facility] [address], [place], [address] Phone: [phone]   Fax:  [phone]  Name: Chelsea Werner MRN: [id number] Date of Birth: [birth year]

## 2017-04-19 NOTE — Telephone Encounter (Signed)
Patient husband states they need a confirmation in writing that she has been Dx with Huntington's disease. Please advise.

## 2017-04-19 NOTE — Unmapped (Signed)
Daughter-in-law is getting invetro.  They need this information to get the correct kit to proceed.      Kathlene November is going to email me the information needed.

## 2017-04-20 NOTE — Unmapped (Signed)
Patients spouse Brenda [name] requesting his wife's letter be mailed to home address below: please advise    [address]   [facility] [place] [address]    Brenda [name] can be reached at [phone]

## 2017-04-20 NOTE — Unmapped (Signed)
Mailed to [employer].  Kept copy in holding file.

## 2017-04-20 NOTE — Unmapped (Signed)
Returned call to [place].  LM on VM to call back.  Dr. Gerilyn Pilgrim printed results of Huntington's Disease.  I asked him to call back and let me know if he wants this mailed or pick up.  We are unable to fax or email.

## 2017-05-05 MED ORDER — darifenacin (ENABLEX) 7.5 MG 24 hr tablet
7.5 | ORAL_TABLET | ORAL | 0 refills | 30.00000 days | Status: AC
Start: 2017-05-05 — End: 2017-11-09

## 2017-06-13 NOTE — Unmapped (Signed)
Brenda [name] / AA. Requests to speak with the Dr regarding the pt's behaviors. States the pt is not taking her medications, yelling up and down the hallway hours at a time, speaking in third person, making irrational requests. States this has been going on for a month and a half, after trying different medications. States they are not capable of taking care of the pt, and are not capable of meeting  the pt's needs. Asks for suggestions or recommendations. Please give Brenda [name] a call back as soon as possible.

## 2017-06-14 NOTE — Unmapped (Signed)
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 She is having significant behavioral issues despite haldol deconate and prns for Geodon and haloperidol. She refuses to take PO meds, She goes up and down the halls screaming. She perseverates about different things and will say things over and over. Her psych medications are managed by Dr. Mcarthur Rossetti [place]  My recommendation would be to increase the haldol deconate further to 50 mg. I told them if she is out of control she may have to go to an inpatient psych.

## 2017-07-10 NOTE — Unmapped (Signed)
I received a message from HD social worker Shon Hale stating patient's husband wanted to schedule an appointment for patient with me. Will you offer an appointment on 08/18/2017 in [place] at 1pm. If this works for them will you make it an hour long visit even though this is an established patient?

## 2017-07-12 NOTE — Telephone Encounter (Signed)
Called and spoke with Husband and scheduled appointment on 08-18-2017 at 1:00 PM at the [facility]. Call Complete.

## 2017-07-12 NOTE — Telephone Encounter (Signed)
Called patients Husband and left message to return call.

## 2017-08-17 ENCOUNTER — Inpatient Hospital Stay
Admission: EM | Admit: 2017-08-17 | Discharge: 2017-08-19 | Disposition: A | Discharge status: Skilled Nursing Facility | Payer: BLUE CROSS/BLUE SHIELD | Source: Other Acute Inpatient Hospital | Admitting: Internal Medicine

## 2017-08-17 ENCOUNTER — Emergency Department: Admit: 2017-08-17 | Payer: BLUE CROSS/BLUE SHIELD

## 2017-08-17 DIAGNOSIS — J690 Pneumonitis due to inhalation of food and vomit: Principal | ICD-10-CM

## 2017-08-17 LAB — CBC WITH AUTO DIFFERENTIAL
Bands Relative: 6 % (ref 0–7)
Basophils %: 0 %
Basophils Absolute: 0 10*3/uL (ref 0.0–0.2)
Eosinophils %: 2 %
Eosinophils Absolute: 0.2 10*3/uL (ref 0.0–0.6)
Hematocrit: 39.6 % (ref 36.0–48.0)
Hemoglobin: 12.9 g/dL (ref 12.0–16.0)
Lymphocytes %: 36 %
Lymphocytes Absolute: 3.2 10*3/uL (ref 1.0–5.1)
MCH: 29.2 pg (ref 26.0–34.0)
MCHC: 32.6 g/dL (ref 31.0–36.0)
MCV: 89.5 fL (ref 80.0–100.0)
MPV: 8.4 fL (ref 5.0–10.5)
Monocytes %: 8 %
Monocytes Absolute: 0.7 10*3/uL (ref 0.0–1.3)
Neutrophils %: 48 %
Neutrophils Absolute: 4.9 10*3/uL (ref 1.7–7.7)
PLATELET SLIDE REVIEW: ADEQUATE
Platelets: 279 10*3/uL (ref 135–450)
RBC Morphology: NORMAL
RBC: 4.42 M/uL (ref 4.00–5.20)
RDW: 14.4 % (ref 12.4–15.4)
WBC: 9 10*3/uL (ref 4.0–11.0)

## 2017-08-17 LAB — COMPREHENSIVE METABOLIC PANEL W/ REFLEX TO MG FOR LOW K
ALT: 74 U/L — ABNORMAL HIGH (ref 10–40)
AST: 123 U/L — ABNORMAL HIGH (ref 15–37)
Albumin/Globulin Ratio: 1.1 (ref 1.1–2.2)
Albumin: 3.8 g/dL (ref 3.4–5.0)
Alkaline Phosphatase: 100 U/L (ref 40–129)
Anion Gap: 18 — ABNORMAL HIGH (ref 3–16)
BUN: 19 mg/dL (ref 7–20)
CO2: 21 mmol/L (ref 21–32)
Calcium: 9.1 mg/dL (ref 8.3–10.6)
Chloride: 97 mmol/L — ABNORMAL LOW (ref 99–110)
Creatinine: 0.8 mg/dL (ref 0.6–1.2)
GFR African American: >60 (ref >60)
GFR Non-African American: >60 (ref >60)
Globulin: 3.5 g/dL
Glucose: 131 mg/dL — ABNORMAL HIGH (ref 70–99)
Potassium reflex Magnesium: 5 mmol/L (ref 3.5–5.1)
Sodium: 136 mmol/L (ref 136–145)
Total Bilirubin: <0.2 mg/dL (ref 0.0–1.0)
Total Protein: 7.3 g/dL (ref 6.4–8.2)

## 2017-08-17 LAB — LACTATE, SEPSIS
Lactic Acid, Sepsis: 3.1 mmol/L — ABNORMAL HIGH (ref 0.4–1.9)
Lactic Acid, Sepsis: 3.2 mmol/L — ABNORMAL HIGH (ref 0.4–1.9)

## 2017-08-17 LAB — BLOOD GAS, ARTERIAL
Base Excess, Arterial: 1.1 mmol/L (ref <=3.0)
Carboxyhgb, Arterial: 0.7 % (ref 0.0–1.5)
HCO3, Arterial: 26.6 mmol/L (ref 21.0–29.0)
Hemoglobin, Art, Extended: 12.6 g/dL (ref 12.0–16.0)
Methemoglobin, Arterial: 0.3 % (ref <1.5)
O2 Content, Arterial: 17 mL/dL
O2 Sat, Arterial: 97 % (ref >92)
TCO2, Arterial: 62.7 mmol/L
pCO2, Arterial: 44.7 mmHg (ref 35.0–45.0)
pH, Arterial: 7.383 (ref 7.350–7.450)
pO2, Arterial: 90.7 mmHg (ref 75.0–108.0)

## 2017-08-17 LAB — PROTIME-INR
INR: 0.92 (ref 0.86–1.14)
Protime: 10.5 s (ref 9.8–13.0)

## 2017-08-17 LAB — TROPONIN: Troponin: <0.01 ng/mL (ref <0.01)

## 2017-08-17 LAB — BRAIN NATRIURETIC PEPTIDE: Pro-BNP: 59 pg/mL (ref 0–124)

## 2017-08-17 MED ORDER — IOPAMIDOL 76 % IV SOLN
76 % | Freq: Once | INTRAVENOUS | Status: AC | PRN
Start: 2017-08-17 — End: 2017-08-17
  Administered 2017-08-17: 21:00:00 75 mL via INTRAVENOUS

## 2017-08-17 MED ORDER — HALOPERIDOL LACTATE 5 MG/ML IJ SOLN
5 MG/ML | Freq: Once | INTRAMUSCULAR | Status: AC
Start: 2017-08-17 — End: 2017-08-17
  Administered 2017-08-17: 23:00:00 5 mg via INTRAMUSCULAR

## 2017-08-17 MED ORDER — DEXTROSE 5 % IV SOLN (MINI-BAG)
5 | Freq: Once | INTRAVENOUS | Status: AC
Start: 2017-08-17 — End: 2017-08-17
  Administered 2017-08-17: 20:00:00 2 g via INTRAVENOUS

## 2017-08-17 MED ORDER — SODIUM CHLORIDE 0.9% BOLUS (FLUID RESUSCITATION)
0.9 % | Freq: Once | INTRAVENOUS | Status: AC
Start: 2017-08-17 — End: 2017-08-17
  Administered 2017-08-17: 20:00:00 2040 mL/kg via INTRAVENOUS

## 2017-08-17 MED ORDER — LORAZEPAM 2 MG/ML IJ SOLN
2 MG/ML | Freq: Once | INTRAMUSCULAR | Status: DC
Start: 2017-08-17 — End: 2017-08-19

## 2017-08-17 MED ORDER — ZIPRASIDONE MESYLATE 20 MG IM SOLR
20 MG | Freq: Once | INTRAMUSCULAR | Status: AC
Start: 2017-08-17 — End: 2017-08-17

## 2017-08-17 MED ORDER — ZIPRASIDONE MESYLATE 20 MG IM SOLR
20 MG | INTRAMUSCULAR | Status: AC
Start: 2017-08-17 — End: 2017-08-17
  Administered 2017-08-17: 22:00:00 10 via INTRAMUSCULAR

## 2017-08-17 MED ORDER — VANCOMYCIN HCL IN DEXTROSE 1-5 GM/200ML-% IV SOLN
1-5 | Freq: Once | INTRAVENOUS | Status: AC
Start: 2017-08-17 — End: 2017-08-17
  Administered 2017-08-17: 22:00:00 1000 mg/kg via INTRAVENOUS

## 2017-08-17 MED FILL — HALOPERIDOL LACTATE 5 MG/ML IJ SOLN: 5 mg/mL | INTRAMUSCULAR | Qty: 1

## 2017-08-17 MED FILL — SODIUM CHLORIDE 0.9 % IV SOLN: 0.9 % | INTRAVENOUS | Qty: 3000

## 2017-08-17 MED FILL — CEFEPIME HCL 2 G IJ SOLR: 2 g | INTRAMUSCULAR | Qty: 2

## 2017-08-17 MED FILL — LORAZEPAM 2 MG/ML IJ SOLN: 2 mg/mL | INTRAMUSCULAR | Qty: 1

## 2017-08-17 MED FILL — VANCOMYCIN HCL IN DEXTROSE 1-5 GM/200ML-% IV SOLN: 1-5 GM/200ML-% | INTRAVENOUS | Qty: 200

## 2017-08-17 MED FILL — GEODON 20 MG IM SOLR: 20 mg | INTRAMUSCULAR | Qty: 20

## 2017-08-17 NOTE — ED Notes (Signed)
RN spoke with care center for report who states that patient was eating lunch, baked Ziti, when she stood up and started coughing, staff assisted with back blows to get patient to cough up food but patient refused to spit out food in fact attempted to continue to eat off of plate and re-swallowed food, going blue limp and unresponsive. CPR was initiated at that time prior to squad arrival. Patient was on mechanical soft diet at that time      Kathryne GinSarah Flint, RN  08/17/17 1435

## 2017-08-17 NOTE — ED Notes (Signed)
Respiratory in with patient at this time to obtain ABG     Kathryne GinSarah Flint, RN  08/17/17 1443

## 2017-08-17 NOTE — ED Notes (Signed)
Respiratory called for evaluation of upper airway congestion for suction evaluation.      Kathryne GinSarah Flint, RN  08/17/17 [phone]

## 2017-08-17 NOTE — ED Notes (Signed)
Husband at bedside with questions reguarding diet. I advised him pt is NPO until further notice and until MD Celine Mansesai see's pt. Husband became very irate and demanded to speak with and see MD immediately. MD Vara GuardianKearse notified and Celine Mansesai paged     Baldo AshSamantha Nicole Sundeep Destin, RN  08/17/17 [phone]

## 2017-08-17 NOTE — ED Provider Notes (Signed)
[facility]URSING  eMERGENCY dEPARTMENT eNCOUnter        Pt Name: Danielle Lawrence  MRN: [id number]  Birthdate [birth year]  Date of evaluation: 08/17/2017  Provider: Verdon Cummins, PA  PCP: [name], MD    This patient was seen and evaluated by the attending physician Crosby Oyster III, DO.      CHIEF COMPLAINT       Chief Complaint   Patient presents with   . Choking     pt in via [facility] from [facility] center after choking during lunch. patient alert on arrival. squad reports sats in the 40s on their arrival on NC, per squad staff at the facility reporting that they initiated compressions, patient 89% on 5L NC on arrival requiring NRB mask support       HISTORY OF PRESENT ILLNESS   (Location/Symptom, Timing/Onset, Context/Setting, Quality, Duration, Modifying Factors, Severity)  Note limiting factors.     Danielle Lawrence is a 62 y.o. female with past medical history of Huntington's chorea, asthma, anxiety and GERD who presents to the ED with complaint of choking episode.  Patient on soft food diet due to difficulty swallowing.  Apparently today was having his ED with cheese.  Apparently started choking and staff came over.  Apparently do the Heimlich maneuver and patient coughed up some food.  Apparently staff told her to spit it out but staff states patient swallowed the food and actually went back to have another bite.  States had a second episode of choking and actually turn blue and became unresponsive.  Unsure if they actually lost pulse.  Staff started CPR.  States while doing CPR had some food that was dislodged from the mouth.  EMS arrived and patient was responsive but hypoxic in the 40s.  Placed on nonrebreather and brought to the ED for further evaluation and treatment.  Patient states still having cough with associated chest discomfort and shortness of breath.  Denies fever, chills, rashes/lesions, headache, lightheadedness/dizziness, abdominal pain, nausea/vomiting, urinary symptoms  or changes in bowel movements.      Nursing Notes were all reviewed and agreed with or any disagreements were addressed  in the HPI.    REVIEW OF SYSTEMS    (2-9 systems for level 4, 10 or more for level 5)     Review of Systems   Constitutional: Negative for activity change, appetite change, chills and fever.   Respiratory: Positive for apnea, cough, choking, chest tightness and shortness of breath. Negative for wheezing and stridor.    Cardiovascular: Positive for chest pain. Negative for palpitations and leg swelling.   Gastrointestinal: Negative for abdominal pain, constipation, diarrhea, nausea and vomiting.   Genitourinary: Negative for difficulty urinating and dysuria.   Musculoskeletal: Negative for arthralgias, back pain, myalgias, neck pain and neck stiffness.   Skin: Negative for color change, pallor, rash and wound.   Neurological: Negative for dizziness, light-headedness and headaches.       Positives and Pertinent negatives as per HPI.  Except as noted abovein the ROS, all other systems were reviewed and negative.       PAST MEDICAL HISTORY     Past Medical History:   Diagnosis Date   . Anxiety    . Asthma    . GERD (gastroesophageal reflux disease)    . Huntington's chorea (HCC)    . Osteopenia    . Seasonal allergies          SURGICAL HISTORY  Past Surgical History:   Procedure Laterality Date   . SINUS SURGERY  2010    deviated septum   . TONSILLECTOMY AND ADENOIDECTOMY  1968   . WISDOM TOOTH EXTRACTION  1978         CURRENTMEDICATIONS       Current Discharge Medication List      CONTINUE these medications which have NOT CHANGED    Details   acetaminophen (TYLENOL) 325 MG tablet Take 650 mg by mouth every 6 hours as needed for Pain      aluminum & magnesium hydroxide-simethicone (MYLANTA) 400-400-40 MG/5ML SUSP Take 30 mLs by mouth every 4 hours as needed      !! clonazePAM (KLONOPIN) 2 MG tablet Take 2 mg by mouth every morning (before breakfast)..      clonazePAM (KLONOPIN) 1 MG disintegrating  tablet Take 1 mg by mouth every 6 hours as needed..      divalproex (DEPAKOTE SPRINKLE) 125 MG capsule Take 500 mg by mouth 2 times daily      bisacodyl (DULCOLAX) 10 MG suppository Place 10 mg rectally as needed for Constipation      darifenacin (ENABLEX) 7.5 MG extended release tablet Take 7.5 mg by mouth daily      Sodium Phosphates (FLEET) 7-19 GM/118ML Place 1 enema rectally once as needed      ziprasidone (GEODON) 20 MG injection Inject 10 mg into the muscle every 6 hours as needed for Agitation      fluticasone (FLONASE) 50 MCG/ACT nasal spray 1 spray by Each Nare route daily      haloperidol decanoate (HALDOL DECANOATE) 100 MG/ML injection Inject 100 mg into the muscle every 30 days      haloperidol (HALDOL) 0.5 MG tablet Take 0.5 mg by mouth every 6 hours as needed      loperamide (IMODIUM) 2 MG capsule Take 2 mg by mouth 4 times daily as needed for Diarrhea      magnesium hydroxide (MILK OF MAGNESIA) 400 MG/5ML suspension Take 30 mLs by mouth every 8 hours as needed for Constipation      bismuth subsalicylate (PEPTO BISMOL) 262 MG chewable tablet Take 524 mg by mouth every 8 hours as needed      OLANZapine zydis (ZYPREXA) 5 MG disintegrating tablet Take 5 mg by mouth 2 times daily      lactobacillus (CULTURELLE) CAPS capsule Take 1 capsule by mouth daily      omeprazole (PRILOSEC) 20 MG capsule Take 20 mg by mouth daily       !! clonazePAM (KLONOPIN) 1 MG tablet Take 1 mg by mouth every evening. .       !! - Potential duplicate medications found. Please discuss with provider.            ALLERGIES     Ritalin [methylphenidate hcl]; Zithromax [azithromycin dihydrate]; Bactrim; Erythromycin; and Escitalopram oxalate    FAMILYHISTORY       Family History   Problem Relation Age of Onset   . Alzheimer's Disease Father    . Depression Sister    . Other Mother         Huntingtons disease          SOCIAL HISTORY       Social History     Social History   . Marital status: Married     Spouse name: N/A   . Number of  children: N/A   . Years of education: N/A     Social History Main  Topics   . Smoking status: Never Smoker   . Smokeless tobacco: Never Used   . Alcohol use No   . Drug use: No   . Sexual activity: Yes     Partners: Male     Other Topics Concern   . None     Social History Narrative   . None       SCREENINGS    Glasgow Coma Scale  Eye Opening: Spontaneous  Best Verbal Response: Incomprehensible speech  Best Motor Response: Obeys commands  Glasgow Coma Scale Score: 12        PHYSICAL EXAM    (up to 7 for level 4, 8 or more for level 5)     ED Triage Vitals [08/17/17 1338]   BP Temp Temp Source Pulse Resp SpO2 Height Weight   106/72 97.4 F (36.3 C) Oral 100 (!) 33 93 % -- 150 lb (68 kg)       Physical Exam   Constitutional: She is oriented to person, place, and time. She appears well-developed and well-nourished. No distress.   HENT:   Head: Normocephalic and atraumatic.   Right Ear: External ear normal.   Left Ear: External ear normal.   Eyes: Conjunctivae are normal. Right eye exhibits no discharge. Left eye exhibits no discharge.   Neck: Normal range of motion. Neck supple. No JVD present.   Cardiovascular: Normal rate, regular rhythm, normal heart sounds and intact distal pulses.  Exam reveals no gallop and no friction rub.    No murmur heard.  2+ radial pulses bilaterally.  No pedal edema.  No calf tenderness.  No JVD.   Pulmonary/Chest: Effort normal. No stridor. No respiratory distress. She has wheezes. She has rales. She exhibits tenderness.   Patient on nonrebreather at this time.  As diffuse wheezing, rales and rhonchi to bilateral lung fields.  Tender to palpation of the anterior chest.   Abdominal: Soft. She exhibits no distension and no mass. There is no tenderness. There is no rebound and no guarding.   Musculoskeletal: Normal range of motion.   Neurological: She is alert and oriented to person, place, and time.   Skin: Skin is warm and dry. She is not diaphoretic. No erythema. No pallor.   Psychiatric:  She has a normal mood and affect. Her behavior is normal.       DIAGNOSTIC RESULTS   LABS:    Labs Reviewed   CBC WITH AUTO DIFFERENTIAL - Abnormal; Notable for the following:        Result Value    Smudge Cells Present (*)     All other components within normal limits    Narrative:     Performed at:  [facility]  [address],  [place], [place] [address]   Phone [phone]   COMPREHENSIVE METABOLIC PANEL W/ REFLEX TO MG FOR LOW K - Abnormal; Notable for the following:     Chloride 97 (*)     Anion Gap 18 (*)     Glucose 131 (*)     ALT 74 (*)     AST 123 (*)     All other components within normal limits    Narrative:     Performed at:  [facility]  [address]  [place], [place] [address]   Phone [phone]   LACTATE, SEPSIS - Abnormal; Notable for the following:     Lactic Acid, Sepsis 3.2 (*)  All other components within normal limits    Narrative:     Performed at:  [facility]  [address],  [place], [place] [id number]   Phone [phone]   LACTATE, SEPSIS - Abnormal; Notable for the following:     Lactic Acid, Sepsis 3.1 (*)     All other components within normal limits    Narrative:     Performed at:  [facility]  [address],  [place], [place] [id number]   Phone [phone]   LACTIC ACID, PLASMA - Abnormal; Notable for the following:     Lactic Acid 2.5 (*)     All other components within normal limits    Narrative:     [facility] 08/18/17 [facility]:21. Reason: draw   l  ast per rn  Performed at:  [facility]  [address],  [place], [place] [address]   Phone [phone]   COMPREHENSIVE METABOLIC PANEL - Abnormal; Notable for the following:     Glucose 112 (*)     ALT 45 (*)     AST 64 (*)     All other components within normal limits    Narrative:     Collection [facility] at 08/18/17 04:21. Reason: draw    l  ast per rn  Performed at:  [facility]  [address],  [place], [place] [id number]   Phone [phone]   CBC - Abnormal; Notable for the following:     WBC 14.3 (*)     RBC 3.96 (*)     Hemoglobin 11.2 (*)     Hematocrit 34.5 (*)     All other components within normal limits    Narrative:     Collection has been rescheduled by [facility] at 08/18/17 04:21. Reason: draw   l  ast per rn  Performed at:  [facility]  [address],  [place], [place] [id number]   Phone [phone]   CULTURE BLOOD #1   CULTURE BLOOD #2   TROPONIN    Narrative:     Performed at:  [facility]  [address],  [place], [place] [address]   Phone [phone]   BRAIN NATRIURETIC PEPTIDE    Narrative:     Performed at:  [facility]  [address],  [place], [place] [address]   Phone [phone]   PROTIME-INR    Narrative:     Performed at:  [facility]  [address],  [place], [place] [id number]   Phone [phone]   BLOOD GAS, ARTERIAL    Narrative:     Performed at:  [facility]  [address],  [place], [place] [address]   Phone [phone]       All other labs were within normal range or not returned as of this dictation.    EKG: All EKG's are interpreted by the Emergency Department Physician who either signs orCo-signs this chart in the absence of a cardiologist.  Please see their note for interpretation of EKG.      RADIOLOGY:   Non-plain film images such as CT, Ultrasound and MRI are read by the radiologist. Plain radiographic images are visualized andpreliminarily interpreted by the  ED Provider with the below findings:  Interpretation perthe Radiologist below, if available at the time of this note:    CT Chest W Contrast   Final Result   1. Large hiatal hernia with gastroesophageal reflux.  Focal areas of   subsegmental atelectasis are seen medial both  lower lungs related to hiatal   hernia.   2. Infiltrate evident right middle lobe, left lower lobe and right lower   lobe, presumably pneumonitis.   3. Low-density soft tissue nodule posterior left upper lobe corresponds to   that seen radiographically, almost certainly on a postinfectious/inflammatory   basis, however this is nonspecific and should be followed with CT chest in 6   months to ensure stability*.   4. Hepatic steatosis.   ______________________________________________________________________________   ____      * Fleischner Society guidelines for follow-up and management of incidentally   detected pulmonary nodules:      Single Solid Nodule:      Nodule size less than 6 mm   In a low-risk patient, no routine follow-up.   In a high-risk patient, optional CT at 12 months.      *Nodule size equals 6-8 mm   In a low-risk patient, CT at 6-12 months, then consider CT at 18-24 months.   In a high-risk patient, CT at 6-12 months, then CT at 18-24 months.      Nodule size greater than 8 mm         In a low-risk patient, consider CT at 3 months, PET/CT, or tissue sampling.      In a high-risk patient, consider CT at 3 months, PET/CT, or tissue sampling.      - Low risk patients include individuals with minimal or absent history of   smoking and other known risk factors.      - High risk patients include individuals with a history or smoking or known   risk factors.      Radiology 2017 [email or web address]://[email or web address]-[email or web address]      RECOMMENDATIONS:   CT chest in 6 months         XR CHEST PORTABLE   Final Result   A new, 6 mm pulmonary nodule is demonstrated central upper left lung   projecting between posterior right ribs 6 and 7.      Medial left lung base subsegmental atelectasis is demonstrated adjacent to   prominent hiatal hernia.      AP chest appears otherwise unremarkable.      RECOMMENDATION:   CT chest follow-up           Xr Chest Portable    Result Date: 08/17/2017  EXAMINATION: SINGLE  XRAY VIEW OF THE CHEST 08/17/2017 2:04 pm COMPARISON: Chest 06/22/2011 HISTORY: ORDERING SYSTEM PROVIDED HISTORY: sob TECHNOLOGIST PROVIDED HISTORY: Reason for exam:->sob Ordering Physician Provided Reason for Exam: sob Acuity: Unknown Type of Exam: Unknown FINDINGS: The cardiomediastinal and hilar silhouettes appear unremarkable.  The right lung appears clear. No pleural effusion evident.  A new, 6 mm pulmonary nodule is demonstrated central upper left lung projecting between posterior right ribs 6 and 7.  Medial left lung base subsegmental atelectasis is demonstrated adjacent to prominent hiatal hernia. No pneumothorax is seen. No acute osseous abnormality is identified.     A new, 6 mm pulmonary nodule is demonstrated central upper left lung projecting between posterior right ribs 6 and 7. Medial left lung base subsegmental atelectasis is demonstrated adjacent to prominent hiatal hernia. AP chest appears otherwise unremarkable. RECOMMENDATION: CT chest follow-up  PROCEDURES   Unless otherwise noted below, none     Procedures    CRITICAL CARE TIME   N/A    CONSULTS:  PHARMACY TO DOSE VANCOMYCIN  IP CONSULT TO SOCIAL WORK      EMERGENCY DEPARTMENT COURSE and DIFFERENTIALDIAGNOSIS/MDM:   Vitals:    Vitals:    08/18/17 0200 08/18/17 0500 08/18/17 0715 08/18/17 0845   BP: 118/78 (!) 163/84  114/68   Pulse: 97 100  99   Resp: 28 28  20    Temp: 100.2 F (37.9 C) 98.9 F (37.2 C)  98.2 F (36.8 C)   TempSrc: Oral Oral  Axillary   SpO2: 95% 95%     Weight:   150 lb (68 kg)    Height:           Patient was given thefollowing medications:  Medications   LORazepam (ATIVAN) injection 1 mg (1 mg Intravenous Not Given 08/17/17 1836)   acetaminophen (TYLENOL) tablet 650 mg (not administered)   aluminum & magnesium hydroxide-simethicone (MAALOX) 200-200-20 MG/5ML suspension 30 mL (not administered)   bisacodyl (DULCOLAX) suppository 10 mg (not administered)   bismuth subsalicylate (PEPTO BISMOL) 262 MG/15ML suspension 30  mL (not administered)   clonazePAM (KLONOPIN) tablet 0.5 mg (not administered)   clonazePAM (KLONOPIN) tablet 1 mg (0 mg Oral Held 08/17/17 2231)   trospium (SANCTURA) tablet 20 mg (0 mg Oral Held 08/17/17 2229)   divalproex (DEPAKOTE SPRINKLE) capsule 500 mg (0 mg Oral Held 08/17/17 2224)   fluticasone (FLONASE) 50 MCG/ACT nasal spray 1 spray (1 spray Each Nare Not Given 08/18/17 0906)   haloperidol (HALDOL) tablet 0.5 mg (not administered)   haloperidol decanoate (HALDOL DECANOATE) injection 100 mg (not administered)   lactobacillus (CULTURELLE) capsule 1 capsule (1 capsule Oral Not Given 08/18/17 0906)   loperamide (IMODIUM) capsule 2 mg (not administered)   magnesium hydroxide (MILK OF MAGNESIA) 400 MG/5ML suspension 30 mL (not administered)   OLANZapine (ZYPREXA) tablet 5 mg (5 mg Oral Not Given 08/18/17 0907)   pantoprazole (PROTONIX) tablet 40 mg (40 mg Oral Not Given 08/18/17 0700)   ziprasidone (GEODON) injection 10 mg (not administered)   ipratropium-albuterol (DUONEB) nebulizer solution 1 ampule (not administered)   enoxaparin (LOVENOX) injection 40 mg (40 mg Subcutaneous Not Given 08/18/17 0906)   piperacillin-tazobactam (ZOSYN) 3.375 g in dextrose 50 mL IVPB extended infusion (premix) (3.375 g Intravenous New Bag 08/18/17 0852)   0.9 % sodium chloride infusion ( Intravenous New Bag 08/18/17 0009)   dextrose 5 % in lactated ringers infusion ( Intravenous New Bag 08/17/17 1947)   acetaminophen (TYLENOL) suppository 650 mg (650 mg Rectal Given 08/18/17 0047)   0.9 % sodium chloride IV bolus 2,040 mL (0 mLs Intravenous Stopped 08/17/17 1818)   cefepime (MAXIPIME) 2 g IVPB minibag (0 g Intravenous Stopped 08/17/17 1524)   vancomycin (VANCOCIN) 1000 mg in dextrose 5% 200 mL IVPB (0 mg Intravenous Stopped 08/17/17 1748)   iopamidol (ISOVUE-370) 76 % injection 75 mL (75 mLs Intravenous Given 08/17/17 1544)   ziprasidone (GEODON) injection 10 mg (10 mg Intramuscular Given 08/17/17 1653)   haloperidol lactate (HALDOL) injection 5  mg (5 mg Intramuscular Given 08/17/17 1827)   sodium chloride 0.9 % infusion (  New Bag 08/18/17 0030)     Patient is a 62 year old female who presents to ED with complaint of respiratory distress and choking episode.  Upon arrival patient on nonrebreather at this time.  IV established and blood work obtained.  ABG unremarkable.  CBC  normal.  CMP showed elevation of liver function testing but was hemolyzed and most likely due to hemolysis.  Troponin normal.  BNP unremarkable.  EKG interpreted by attending.  Lactic acid 3.2.  Given fluids and broad-spectrum antibiotics for concern for aspiration.  Blood cultures pending at this time.  His x-ray showed pulmonary nodule and atelectasis/hiatal hernia.  CT of the chest with contrast showed large hiatal hernia with reflux.  Atelectasis noted to the lower lungs.  Infiltrate evident to the right middle lobe, left lower lobe and right lower lobe likely pneumonitis.  Lung nodule noted as well.  At the end of my shift will sign out to attending.  Anticipate admission for hypoxia, respiratory distress and aspiration pneumonia.          FINAL IMPRESSION      1. Respiratory distress    2. Aspiration pneumonia, unspecified aspiration pneumonia type, unspecified laterality, unspecified part of lung (HCC)    3. Hypoxia          DISPOSITION/PLAN   DISPOSITION Admitted 08/17/2017 06:52:51 PM      PATIENT REFERREDTO:  [name], MD  [address]  [address]  [place] [address]  [phone]          [facility]  [address]                                     [place] [address]  [phone]            DISCHARGE MEDICATIONS:  Current Discharge Medication List          DISCONTINUED MEDICATIONS:  Current Discharge Medication List                 (Please note that portions ofthis note were completed with a voice recognition program.  Efforts were made to edit the dictations but occasionally words are mis-transcribed.)    Verdon Cummins, PA (electronically signed)           Isabella Stalling, PA  08/18/17 [phone]

## 2017-08-17 NOTE — ED Notes (Signed)
Pt extremely agitated at this time. Husband states she takes geodon at the facility when she gets like this. Patient wanting to go back to the facility. Screaming "I want to go back to my room. I like my room". Patient not responding well to verbal redirection. Spoke to provider requesting gedone order.      Kathryne GinSarah Flint, RN  08/17/17 [phone]

## 2017-08-17 NOTE — ED Notes (Signed)
Pt screaming for juice and mashed potatoes. Patient increasingly agitated when told she can not have anything to eat or drink per MD due to aspiration. Patient daughter updated as well on NPO status. Patient lactic re drawn without difficulty from PIV. IVF complete. Patient vitals obtained.      Kathryne GinSarah Flint, RN  08/17/17 1820

## 2017-08-17 NOTE — ED Notes (Signed)
Bed: 02  Expected date:   Expected time:   [facility]  Comments:  FP [address]     Ilia Dimaano Rachelle BancroftJones, [place]RN  08/17/17 1331

## 2017-08-17 NOTE — ED Notes (Signed)
Pharmacy Medication History Note      List of current medications patient is taking is complete.    Source of information: MAR from [facility], called nurse for last dose times.    Changes made to medication list:  Medications flagged for removal (include reason, ex. noncompliance):   N/A    Medications removed (include reason, ex. therapy complete or physician discontinued):   Clonazepam- dose adjustment    Medications added/doses adjusted:   Pepto- Q8h prn   Zyprexa 5 ODT- BID   Haloperidol 0.5mg - Q6h prn   Haloperidol /ml- Q30   Geodon-  Q6h prn   Enablex 7.5mg - QD   Clonazepam - QPM   Clonazepam - QAM    Other notes (ex. Recent course of antibiotics, Coumadin dosing):   Denies use of other OTC or herbal medications.    Last dose times updated.   Samara Deist Brace CPHT

## 2017-08-17 NOTE — ED Notes (Signed)
Pt more awake at this time, slightly agitated but redirectable and consolable, yells out " im choking". Pt is able to swallow saliva without difficulty, complaints of dry mouth     Baldo AshSamantha Nicole Thoms Barthelemy, RN  08/17/17 2137

## 2017-08-17 NOTE — ED Provider Notes (Signed)
I independently performed a history and physical on Danielle Danielle.   All diagnostic, treatment, and disposition decisions were made by myself in conjunction with the advanced practice provider.     Briefly, this is a 62 y.o. female here for Respiratory distress.  Patient has Huntington's disease and often has some dysphasia and soft food diet.  Today she had a choking episode, while apparently eating baked ziti, Heimlich maneuver was administered.  She arrives today with 100% nonrebreather applied with oxygen at 95%.  Denies chest pain or pressure.  Symptoms were moderate.  See APP note      On exam:  Constitutional:  Well developed, well nourished, non-toxic appearance   Eyes:   conjunctiva normal   HENT:  Atraumatic, external ears normal, nosenormal,  Respiratory:  No respiratory distress, abnormal breath sounds, no rales, no wheezing . Coarse BS  Cardiovascular:  Normal rate, normal rhythm, no murmurs,   GI:  Soft, nondistended, nontender, no obvious organomegaly, no mass, no rebound, no guarding   Musculoskeletal:  No tenderness, no deformities.  Integument:  no rashes on exposed surfaces  Neurologic:  Alert & oriented x 3, no  Acute focal deficits noted   Psychiatric:  baseline behavior .  + agitation.      EKG  No acute ischemia      Screenings            MDM    Patient had to be sedated today because she got quite anxious and agitated.  This is happened in the past and is somewhat her normal behavior according to family who is at bedside.  Patient had to be sedated here in the emergency department with medications.  She then also wanted to eat but I did not feel comfortable with having her eat, light of her choking and respiratory distress/failure episode.  I did discuss the case with Dr. Celine Mansesai who will admit.    She initially was on nonrebreather and was doing well with this.  After some time she was able to relax a little bit and be escalated to nasal cannula and was doing okay with this at 95% .    SEE APP  NOTE      Critical Care  There was a high probability of life-threatening clinical deterioration in the patient's condition requiring my urgent intervention.  Total critical care time with the patient was 33 minutes excluding separately reportable procedures.  Critical care required due to patients acute agitation. Hypoxia      Patient Referrals:  Danielle SantiagoJo Ann Sparnall, MD  [address]  [address]  [place] [id number]  [phone]            Discharge Medications:  New Prescriptions    No medications on file       FINAL IMPRESSION  1. Respiratory distress    2. Aspiration pneumonia, unspecified aspiration pneumonia type, unspecified laterality, unspecified part of lung (HCC)    3. Hypoxia        Blood pressure (!) 142/55, pulse 108, temperature 97.9 F (36.6 C), temperature source Infrared, resp. rate (!) 51, weight 150 lb (68 kg), SpO2 95 %, not currently breastfeeding.     For further details of Danielle Danielle's emergency department encounter, please see documentation by advanced practice provider.        Danielle OysterFrank J Kearse III, DO  08/17/17 2030

## 2017-08-17 NOTE — ED Notes (Signed)
Pt alert, oriented to self and situation, patient states that she was eating lunch and choked on garlic bread. Patient states she was able to cough it up but squad reports that the heimlich maneuver was used and on their arrival patient was blue. Patient was 40% on NC on their arrival, patient currently low 90s on NRB at 15L , color appropriate for ethnicity, skin warm dry and intact, resp rate even equal and unlabored, rapid. Lung sounds diminished, no upper airway sounds noted. Patient suctioned by respiratory on arrival.  Patient requesting to eat upon arrival to ER and instructed on current NPO status. Pt placed on cardiac monitor, is sinus, rate is 86. ST segment monitoring enabled.  Bed alarm on for pt safety, rails up x 2, bed in low position.  Family at bedside, call light in reach, will continue to monitor.       Kathryne GinSarah Flint, RN  08/17/17 [phone]

## 2017-08-17 NOTE — ED Notes (Signed)
Pt confirmed sinus tach on 3T tele per Dara  [facility]     Baldo AshSamantha Nicole Mairely Foxworth, RN  08/17/17 [phone]

## 2017-08-17 NOTE — ED Notes (Signed)
Pt was on high flow O2, removed and spot check O2 sat was 95     Danielle AshSamantha Nicole Jayleana Colberg, RN  08/17/17 2134

## 2017-08-17 NOTE — Progress Notes (Signed)
RESPIRATORY THERAPY ASSESSMENT    Name:  Kingston Record Number:  [id number]  Age: 62 y.o.   Gender: female  DOB: [birth year]  Today's Date:  08/17/2017  Room:  3TN-3361/3361-01    Assessment   Patient Admission Diagnosis      Allergies  Allergies   Allergen Reactions   . Ritalin [Methylphenidate Hcl] Hives   . Zithromax [Azithromycin Dihydrate] Anaphylaxis and Other (See Comments)     abdominal pain   . Bactrim Hives   . Erythromycin Diarrhea   . Escitalopram Oxalate        Minimum Predicted Vital Capacity:     uta          Actual Vital Capacity:      uta          Pulmonary History: Asthma   Home Oxygen Therapy:  room air  Home Respiratory Therapy: None  Current Respiratory Therapy:  Duo prn          Respiratory Severity Index(RSI)   Patients with orders for inhalation medications, oxygen, or any therapeutic treatment modality will be placed on Respiratory Protocol.  They will be assessed with the first treatment and at least every 72 hours thereafter.  The following severity scale will be used to determine frequency of treatment intervention.    Smoking History: No Smoking History = 0    Social History  Social History   Substance Use Topics   . Smoking status: Never Smoker   . Smokeless tobacco: Never Used   . Alcohol use No       Recent Surgical History: None = 0  Past Surgical History  Past Surgical History:   Procedure Laterality Date   . SINUS SURGERY  2010    deviated septum   . TONSILLECTOMY AND ADENOIDECTOMY  1968   . WISDOM TOOTH EXTRACTION  1978       Level of Consciousness: Alert, Follows Commands but Disoriented = 1    Level of Activity: Walking with assistance = 1    Respiratory Pattern: Increased; RR 21-30 = 1    Breath Sounds: Diminshed bilaterally and/or crackles = 2    Sputum   ,  ,    Cough: Strong, spontaneous, non-productive = 0    Vital Signs   BP (!) 149/51   Pulse 109   Temp 97.9 F (36.6 C) (Infrared)   Resp (!) 34   Ht 5' 7"  (1.702 m) Comment: estimate  Wt 150 lb (68 kg)    SpO2 93%   BMI 23.49 kg/m   SPO2 (COPD values may differ): 90-91% on room air or greater than 92% on FiO2 24- 28% = 1    Peak Flow (asthma only): not applicable = 0    RSI: 5-6 = Q4hr PRN (every four hours as needed) for dyspnea        Plan       Goals: improve oxygenation  Plan of Care: Eval of swallow    Patient/caregiver was educated on the proper method of use of Respiratory Therapy device:  Yes      Level of understanding, patient and caregiver was able to:(check appropriate box(es))   []  Verbalize understanding   []  Demonstrate understanding       []  Teach back        []  Needs reinforcement       []   No available caregiver               []   Other:     Response to education:  Very Good     Teaching Time:  5  minutes      Is patient being placed on Home Treatment Regimen?  No     Electronically signed by Donavan Foil, RCP on 08/17/2017 at 11:28 PM    Respiratory Protocol Guidelines     1. Assessment and treatment by Respiratory Therapy will be initiated for medication and therapeutic interventions upon initiation of aerosolized medication.  2. Physician will be contacted for respiratory rate (RR) greater than 35 breaths per minute. Therapy will be held for heart rate (HR) greater than 140 beats per minute, pending direction from physician.  3. Bronchodilators will be administered via Metered Dose Inhaler (MDI) with spacer when the following criteria are met:  a. Alert and cooperative     b. HR < 140 bpm  c. RR < 30 bpm                d. Can demonstrate a 2-3 second inspiratory hold  4. Bronchodilators will be administered via Hand Held Nebulizer [facility]) to patients when ANY of the following criteria are met  a. Incognizant or uncooperative          b. Patients treated with HHN at Home        c. Unable to demonstrate proper MDI or HFA technique     d. RR > 30 bpm   5. Bronchodilators will be delivered via Metered Dose Inhaler (MDI) or Hydrofluoroalkane (HFA) with spacer to intubated patients on mechanical  ventilation.  6. Inhalation medication orders will be delivered and/or substituted as outlined below.    Aerosolized Medications Ordering and Administration Guidelines:    1. All Medications will be ordered by a physician, and their frequency and/or modality will be adjusted as defined by the patients Respiratory Severity Index (RSI) score.  2. If the patient does not have documented COPD, consider discontinuing anticholinergics when RSI is less than 9.  3. If the bronchospasm worsens (increased RSI), then the bronchodilator frequency can be increased to a maximum of every 4 hours.  If greater than every 4 hours is required, the physician will be contacted.  4. If the bronchospasm improves, the frequency of the bronchodilator can be decreased, based on the patient's RSI, but not less than home treatment regimen frequency.  5. Bronchodilator(s) will be discontinued if patient has a RSI less than 9 and has received no scheduled or as needed treatment for 72  Hrs.    Patients Ordered on a Mucolytic Agent:    1. Must always be administered with a bronchodilator.    2. Discontinue if patient experiences worsened bronchospasm, or secretions have lessened to the point that the patient is able to clear them with a cough.    Anti-inflammatory and Combination Medications:    1. If the patient lacks prior history of lung disease, is not using inhaled anti-inflammatory medication at home, and lacks wheezing by examination or by history for at least 24 hours, contact physician for possible discontinuation.

## 2017-08-17 NOTE — ED Notes (Signed)
RN attempted to obtain ABG, unsuccessful attempt x1 from left radial artery, respiratory notified and will attempt.      Kathryne GinSarah Flint, RN  08/17/17 [phone]

## 2017-08-17 NOTE — ED Notes (Signed)
IVF infusion started through patent PIV  Pt asleep in prone position at this time  Family at bedside, more cooperative and understanding at this time, their concern is with her huntingtons disease that she does not understand and will not understand she cannot eat and will become loud and agitated per husband     Danielle AshSamantha Nicole Jessiah Wojnar, RN  08/17/17 [phone]

## 2017-08-17 NOTE — ED Notes (Signed)
Pt to CT at this time.      Kathryne GinSarah Flint, RN  08/17/17 1534

## 2017-08-17 NOTE — Progress Notes (Addendum)
Patient arrived to 3 tower room 3361. Assist to Bathroom and returned to bed with alarm engaged. Oriented to person but restless and agitated. Patient keeps requesting water, mouth swabbed. Family at side. Call light in reach

## 2017-08-17 NOTE — ED Notes (Signed)
Patient states she feels like she is choking. Respiratory notified and in for evaluation.      Kathryne GinSarah Flint, RN  08/17/17 [phone]

## 2017-08-17 NOTE — Progress Notes (Signed)
ABG specimen sent to lab at this time.

## 2017-08-18 ENCOUNTER — Ambulatory Visit: Payer: PRIVATE HEALTH INSURANCE | Attending: Neurology

## 2017-08-18 LAB — CBC
Hematocrit: 34.5 % — ABNORMAL LOW (ref 36.0–48.0)
Hemoglobin: 11.2 g/dL — ABNORMAL LOW (ref 12.0–16.0)
MCH: 28.3 pg (ref 26.0–34.0)
MCHC: 32.5 g/dL (ref 31.0–36.0)
MCV: 87 fL (ref 80.0–100.0)
MPV: 8.5 fL (ref 5.0–10.5)
Platelets: 193 10*3/uL (ref 135–450)
RBC: 3.96 M/uL — ABNORMAL LOW (ref 4.00–5.20)
RDW: 14 % (ref 12.4–15.4)
WBC: 14.3 10*3/uL — ABNORMAL HIGH (ref 4.0–11.0)

## 2017-08-18 LAB — COMPREHENSIVE METABOLIC PANEL
ALT: 45 U/L — ABNORMAL HIGH (ref 10–40)
AST: 64 U/L — ABNORMAL HIGH (ref 15–37)
Albumin/Globulin Ratio: 1.1 (ref 1.1–2.2)
Albumin: 3.4 g/dL (ref 3.4–5.0)
Alkaline Phosphatase: 83 U/L (ref 40–129)
Anion Gap: 10 (ref 3–16)
BUN: 12 mg/dL (ref 7–20)
CO2: 26 mmol/L (ref 21–32)
Calcium: 8.9 mg/dL (ref 8.3–10.6)
Chloride: 109 mmol/L (ref 99–110)
Creatinine: 0.8 mg/dL (ref 0.6–1.2)
GFR African American: >60 (ref >60)
GFR Non-African American: >60 (ref >60)
Globulin: 3.2 g/dL
Glucose: 112 mg/dL — ABNORMAL HIGH (ref 70–99)
Potassium: 5 mmol/L (ref 3.5–5.1)
Sodium: 145 mmol/L (ref 136–145)
Total Bilirubin: 0.4 mg/dL (ref 0.0–1.0)
Total Protein: 6.6 g/dL (ref 6.4–8.2)

## 2017-08-18 LAB — EKG 12-LEAD
Atrial Rate: 105 {beats}/min
P Axis: 59 degrees
P-R Interval: 136 ms
Q-T Interval: 344 ms
QRS Duration: 70 ms
QTc Calculation (Bazett): 454 ms
R Axis: 58 degrees
T Axis: 74 degrees
Ventricular Rate: 105 {beats}/min

## 2017-08-18 LAB — LACTIC ACID: Lactic Acid: 2.5 mmol/L — ABNORMAL HIGH (ref 0.4–2.0)

## 2017-08-18 MED ORDER — CLONAZEPAM 1 MG PO TABS
1 MG | Freq: Four times a day (QID) | ORAL | Status: DC | PRN
Start: 2017-08-18 — End: 2017-08-19

## 2017-08-18 MED ORDER — DIVALPROEX SODIUM 125 MG PO CSDR
125 MG | Freq: Two times a day (BID) | ORAL | Status: DC
Start: 2017-08-18 — End: 2017-08-19
  Administered 2017-08-18 – 2017-08-19 (×2): 500 mg via ORAL

## 2017-08-18 MED ORDER — CLONAZEPAM 1 MG PO TABS
1 MG | Freq: Every evening | ORAL | Status: DC
Start: 2017-08-18 — End: 2017-08-18

## 2017-08-18 MED ORDER — SODIUM CHLORIDE 0.9 % IV SOLN
0.9 % | INTRAVENOUS | Status: DC
Start: 2017-08-18 — End: 2017-08-19
  Administered 2017-08-18: 05:00:00 via INTRAVENOUS

## 2017-08-18 MED ORDER — ALUM & MAG HYDROXIDE-SIMETH 200-200-20 MG/5ML PO SUSP
200-200-20 MG/5ML | ORAL | Status: DC | PRN
Start: 2017-08-18 — End: 2017-08-19

## 2017-08-18 MED ORDER — OLANZAPINE 5 MG PO TABS
5 MG | Freq: Two times a day (BID) | ORAL | Status: DC
Start: 2017-08-18 — End: 2017-08-19
  Administered 2017-08-19 (×2): 5 mg via ORAL

## 2017-08-18 MED ORDER — ACETAMINOPHEN 650 MG RE SUPP
650 MG | RECTAL | Status: DC | PRN
Start: 2017-08-18 — End: 2017-08-19
  Administered 2017-08-18: 06:00:00 650 mg via RECTAL

## 2017-08-18 MED ORDER — ACETAMINOPHEN 325 MG PO TABS
325 MG | Freq: Four times a day (QID) | ORAL | Status: DC | PRN
Start: 2017-08-18 — End: 2017-08-19
  Administered 2017-08-19 (×2): 650 mg via ORAL

## 2017-08-18 MED ORDER — LOPERAMIDE HCL 2 MG PO CAPS
2 MG | Freq: Four times a day (QID) | ORAL | Status: DC | PRN
Start: 2017-08-18 — End: 2017-08-19

## 2017-08-18 MED ORDER — HALOPERIDOL DECANOATE 100 MG/ML IM SOLN
100 MG/ML | INTRAMUSCULAR | Status: DC
Start: 2017-08-18 — End: 2017-08-19

## 2017-08-18 MED ORDER — IPRATROPIUM-ALBUTEROL 0.5-2.5 (3) MG/3ML IN SOLN
RESPIRATORY_TRACT | Status: DC | PRN
Start: 2017-08-18 — End: 2017-08-19

## 2017-08-18 MED ORDER — MAGNESIUM HYDROXIDE 400 MG/5ML PO SUSP
400 MG/5ML | Freq: Three times a day (TID) | ORAL | Status: DC | PRN
Start: 2017-08-18 — End: 2017-08-19

## 2017-08-18 MED ORDER — SODIUM CHLORIDE 0.9 % IV SOLN
0.9 % | INTRAVENOUS | Status: AC
Start: 2017-08-18 — End: 2017-08-18
  Administered 2017-08-18: 06:00:00

## 2017-08-18 MED ORDER — DEXTROSE IN LACTATED RINGERS 5 % IV SOLN
5 % | INTRAVENOUS | Status: DC
Start: 2017-08-18 — End: 2017-08-19
  Administered 2017-08-18: 01:00:00 via INTRAVENOUS

## 2017-08-18 MED ORDER — PANTOPRAZOLE SODIUM 40 MG PO TBEC
40 MG | Freq: Every day | ORAL | Status: DC
Start: 2017-08-18 — End: 2017-08-19
  Administered 2017-08-19 (×2): 40 mg via ORAL

## 2017-08-18 MED ORDER — CLONAZEPAM 1 MG PO TABS
1 MG | Freq: Two times a day (BID) | ORAL | Status: DC
Start: 2017-08-18 — End: 2017-08-19
  Administered 2017-08-19 (×2): 1 mg via ORAL

## 2017-08-18 MED ORDER — FLUTICASONE PROPIONATE 50 MCG/ACT NA SUSP
50 MCG/ACT | Freq: Every day | NASAL | Status: DC
Start: 2017-08-18 — End: 2017-08-19
  Administered 2017-08-19: 15:00:00 1 via NASAL

## 2017-08-18 MED ORDER — BISMUTH SUBSALICYLATE 262 MG/15ML PO SUSP
262 MG/15ML | Freq: Three times a day (TID) | ORAL | Status: DC | PRN
Start: 2017-08-18 — End: 2017-08-19

## 2017-08-18 MED ORDER — CULTURELLE PO CAPS
Freq: Every day | ORAL | Status: DC
Start: 2017-08-18 — End: 2017-08-19
  Administered 2017-08-19: 15:00:00 1 via ORAL

## 2017-08-18 MED ORDER — CLONAZEPAM 0.5 MG PO TABS
0.5 MG | Freq: Four times a day (QID) | ORAL | Status: DC | PRN
Start: 2017-08-18 — End: 2017-08-18
  Administered 2017-08-18: 15:00:00 0.5 mg via ORAL

## 2017-08-18 MED ORDER — NORMAL SALINE FLUSH 0.9 % IV SOLN
0.9 % | INTRAVENOUS | Status: AC
Start: 2017-08-18 — End: 2017-08-19

## 2017-08-18 MED ORDER — TROSPIUM CHLORIDE 20 MG PO TABS
20 MG | Freq: Every evening | ORAL | Status: DC
Start: 2017-08-18 — End: 2017-08-19
  Administered 2017-08-19: 02:00:00 20 mg via ORAL

## 2017-08-18 MED ORDER — BISACODYL 10 MG RE SUPP
10 MG | RECTAL | Status: DC | PRN
Start: 2017-08-18 — End: 2017-08-19

## 2017-08-18 MED ORDER — ZIPRASIDONE MESYLATE 20 MG IM SOLR
20 MG | Freq: Four times a day (QID) | INTRAMUSCULAR | Status: DC | PRN
Start: 2017-08-18 — End: 2017-08-19
  Administered 2017-08-19: 15:00:00 10 mg via INTRAMUSCULAR

## 2017-08-18 MED ORDER — ENOXAPARIN SODIUM 40 MG/0.4ML SC SOLN
40 MG/0.4ML | Freq: Every day | SUBCUTANEOUS | Status: DC
Start: 2017-08-18 — End: 2017-08-19
  Administered 2017-08-19: 15:00:00 40 mg via SUBCUTANEOUS

## 2017-08-18 MED ORDER — PIPERACILLIN-TAZOBACTAM 3.375 G IN 50 ML (PREMIX) IVPB EXTENDED
Freq: Three times a day (TID) | INTRAVENOUS | Status: DC
Start: 2017-08-18 — End: 2017-08-19
  Administered 2017-08-18 (×3): 3.375 g via INTRAVENOUS

## 2017-08-18 MED ORDER — HALOPERIDOL 1 MG PO TABS
1 MG | Freq: Four times a day (QID) | ORAL | Status: DC | PRN
Start: 2017-08-18 — End: 2017-08-19

## 2017-08-18 MED FILL — ACETAMINOPHEN 650 MG RE SUPP: 650 mg | RECTAL | Qty: 1

## 2017-08-18 MED FILL — SODIUM CHLORIDE 0.9 % IV SOLN: 0.9 % | INTRAVENOUS | Qty: 1000

## 2017-08-18 MED FILL — DEXTROSE IN LACTATED RINGERS 5 % IV SOLN: 5 % | INTRAVENOUS | Qty: 1000

## 2017-08-18 MED FILL — SODIUM CHLORIDE 0.9 % IV SOLN: 0.9 % | INTRAVENOUS | Qty: 250

## 2017-08-18 MED FILL — LOVENOX 40 MG/0.4ML SC SOLN: 40 MG/0.4ML | SUBCUTANEOUS | Qty: 0.4

## 2017-08-18 MED FILL — ZOSYN 3-0.375 GM/50ML IV SOLN: 3-0.375 GM/50ML | INTRAVENOUS | Qty: 50

## 2017-08-18 MED FILL — DIVALPROEX SODIUM 125 MG PO CSDR: 125 mg | ORAL | Qty: 4

## 2017-08-18 MED FILL — CLONAZEPAM 1 MG PO TABS: 1 mg | ORAL | Qty: 1

## 2017-08-18 MED FILL — SODIUM CHLORIDE FLUSH 0.9 % IV SOLN: 0.9 % | INTRAVENOUS | Qty: 10

## 2017-08-18 MED FILL — FLUTICASONE PROPIONATE 50 MCG/ACT NA SUSP: 50 MCG/ACT | NASAL | Qty: 16

## 2017-08-18 NOTE — Progress Notes (Signed)
Resting quietly in bed, eyes closed, responded to verbal stimuli.  Says she is feels better.  Temp now 100.2 (O).  Has occasional coughing spells, gets agitated and c/o choking with coughing spells, but calms down when coughing stops.  Will continue to monitor.jmitchell

## 2017-08-18 NOTE — Progress Notes (Signed)
Gave Tylenol Suppository for temp of 100.8.jmitchell

## 2017-08-18 NOTE — Progress Notes (Signed)
Patient Active Problem List   Diagnosis   . Asthma   . Seasonal allergies   . GERD (gastroesophageal reflux disease)   . Osteopenia   . Huntington chorea (HCC)   . Anxiety   . Acute aspiration pneumonia (HCC)   . Agitation   . Oropharyngeal dysphagia   H&P dictated

## 2017-08-18 NOTE — Progress Notes (Signed)
Pt jumped up to go to bath room while speech eval was being done.. Husband continued to help pt to BR.... Pt back to bed , positioned for comfort... Waiting on yogurt and banana  Will monitor.

## 2017-08-18 NOTE — Progress Notes (Signed)
Patient has removed IV again Hospitalist paged to inquire about leaving out. Patient ambulated to the RR. She did eat some pudding and jello and took her pills whole or in half with applesauce.    She cries out frequently and throws herself on the bed. Nurse and aid in to assess often.

## 2017-08-18 NOTE — Progress Notes (Signed)
Husband not in room... Pt calling out that she wants to sleep.... Pt settled down on stomach.... Will monitor.

## 2017-08-18 NOTE — Progress Notes (Signed)
Resting quietly in bed, eyes closed, respirations tachy, responded to verbal stimuli, denies having any pain.  Starts coughing, says she is choking, restless/agitated but cooperative.  Much calmer when not coughing. Temp 100.8 (O), RR and HR tachy, BP stable.  Assessment completed, see flow charts.jmitchell

## 2017-08-18 NOTE — Discharge Instructions (Addendum)
Continuity of Care Form    Patient Name: Danielle Lawrence   DOB:  1955/08/19  MRN:  [id number]    Admit date:  08/17/2017  Discharge date:  08/19/2017    Code Status Order: Full Code   Advance Directives:   Advance Care Flowsheet Documentation     Date/Time Healthcare Directive Type of Healthcare Directive Copy in Chart Healthcare Agent Appointed Healthcare Agent's Name Healthcare Agent's Phone Number    08/17/17 [phone]  Unknown, patient unable to respond due to medical condition -- -- -- -- --          Admitting Physician:  Sherlyn Hay, MD  PCP: [name], MD    Discharging Nurse: Doran Clay RN  Discharging [facility] Unit/Room#: 3TN-3361/3361-01  Discharging Unit Phone Number: [phone]    Emergency Contact:   Extended Emergency Contact Information  Primary Emergency Contact: Danielle Lawrence  Address: [address]           [place], [place] [address] [place]  Home Phone: [phone]  Work Phone: [phone]  Relation: Spouse  Secondary Emergency Contact: [place]  Home Phone: [phone]  Mobile Phone: [phone]  Relation: Child    Past Surgical History:  Past Surgical History:   Procedure Laterality Date   . SINUS SURGERY  2010    deviated septum   . TONSILLECTOMY AND ADENOIDECTOMY  1968   . WISDOM TOOTH EXTRACTION  1978       Immunization History:   Immunization History   Administered Date(s) Administered   . Influenza Virus Vaccine 03/22/2012, 03/30/2013, 04/11/2014, 05/12/2016   . Influenza, Quadv, 3 Years and older, IM (Fluzone 3 yrs and older or Afluria 5 yrs and older) 04/16/2015   . Pneumococcal 13-valent Conjugate (Prevnar13) 04/11/2014   . Tdap (Boostrix, Adacel) 05/23/2014   . Zoster Live (Zostavax) 12/24/2012       Active Problems:  Patient Active Problem List   Diagnosis Code   . Asthma J45.909   . Seasonal allergies J30.2   . GERD (gastroesophageal reflux disease) K21.9   . Osteopenia M85.80   . Huntington chorea (HCC) G10   . Anxiety F41.9   .  Acute aspiration pneumonia (HCC) J69.0   . Agitation R45.1   . Oropharyngeal dysphagia R13.12       Isolation/Infection:   Isolation          No Isolation            Nurse Assessment:  Last Vital Signs: BP (!) 152/85   Pulse 121   Temp 98.5 F (36.9 C) (Axillary)   Resp 18   Ht  (1.702 m) Comment: estimate  Wt 150 lb 9.6 oz (68.3 kg)   SpO2 94%   BMI 23.59 kg/m     Last documented pain score (0-10 scale): Pain Level:  (Patient asleep.)  Last Weight:   Wt Readings from Last 1 Encounters:   08/19/17 150 lb 9.6 oz (68.3 kg)     Mental Status:  oriented and alert    IV Access:  - None    Nursing Mobility/ADLs:  Walking   Assisted  Transfer  Assisted  Bathing  Assisted  Dressing  Assisted  Toileting  Assisted  Feeding  Independent  Med Admin  Assisted   Med Delivery   whole    Wound Care Documentation and Therapy:        Elimination:  Continence:    Bowel: Yes   Bladder:  Yes  Urinary Catheter: None   Colostomy/Ileostomy/Ileal Conduit: No       Date of Last BM: 08/19/2017    Intake/Output Summary (Last 24 hours) at 08/19/17 1428  Last data filed at 08/19/17 1145   Gross per 24 hour   Intake              836 ml   Output                0 ml   Net              836 ml     I/O last 3 completed shifts:  In: 236 [P.O.:236]  Out: -     Safety Concerns:     At Risk for Falls and Aspiration Risk    Impairments/Disabilities:      None    Nutrition Therapy:  Current Nutrition Therapy:   - Oral Diet:  Dysphagia 1 pureed, Thin Liquids    Routes of Feeding: Oral  Liquids: Thin Liquids  Daily Fluid Restriction: no  Last Modified Barium Swallow with Video (Video Swallowing Test): not done  DYSPHAGIA I PUREED;   Treatments at the Time of Hospital Discharge:   Respiratory Treatments: None  Oxygen Therapy:  is not on home oxygen therapy.  Ventilator:    - No ventilator support    Rehab Therapies: Speech/Language Therapy  Weight Bearing Status/Restrictions: No weight bearing restirctions  Other Medical Equipment (for  information only, NOT a DME order):  hospital bed  Other Treatments: None    Patient's personal belongings (please select all that are sent with patient):  None    RN SIGNATURE:  Electronically signed by Phillips Climes, RN on 08/19/17 at 5:32 PM    CASE MANAGEMENT/SOCIAL WORK SECTION    Inpatient Status Date: 08-17-17    Readmission Risk Assessment Score:  Readmission Risk              Risk of Unplanned Readmission:        14           Discharging to Facility/ Agency    [facility]  Address:  [address] , [place] [address]        Phone: [phone]       Fax: [phone]         Fax:    Case Manager/Social Worker signature: Electronically signed by Quincy Carnes, LSW on 08/18/17 at 8:48 AM    PHYSICIAN SECTION    Prognosis: Poor    Condition at Discharge: Stable    Rehab Potential (if transferring to Rehab): Poor    Recommended Labs or Other Treatments After Discharge: cbc bmp in 1 week     Physician Certification: I certify the above information and transfer of Danielle Lawrence  is necessary for the continuing treatment of the diagnosis listed and that she requires Intermediate Nursing Care for greater 30 days.     Update Admission H&P: No change in H&P    PHYSICIAN SIGNATURE:  Electronically signed by Sherlyn Hay, MD on 08/19/17 at 1:43 PM

## 2017-08-18 NOTE — Care Coordination-Inpatient (Signed)
Discharge Planning Assessment  RN/SW discharge planner met with patient/(and family member) to discuss reason for admission, current living situation, and potential needs at the time of discharge    Demographics/Insurance verified Yes    Current type of dwelling: long term care    Patient from ECF/SW confirmed with: Patient is from the [facility] at the time of discharge:    Tentative discharge plan: Return to the [facility].  MD, Please complete & sign the e-COC under the discharge instructions, AVS/Prescriptions, and or medical necessity note for assistance with discharge planning, Thank you, Sofie Hartigan, MSW, [phone]

## 2017-08-18 NOTE — H&P (Signed)
[facility]                     [address] [place], [place] [id number]                              HISTORY AND PHYSICAL    PATIENT NAME: Danielle Lawrence, Danielle Lawrence                        DOB:        Dec 09, 1955  MED REC NO:   [id number]                          ROOM:       3361  ACCOUNT NO:   [id number]                           ADMIT DATE: 08/17/2017  PROVIDER:     Waylan RocherShwetal Kasy Iannacone, MD    HISTORY OF PRESENT ILLNESS:  The patient is a 62 year old unfortunate  middle aged woman with Huntington's disease came to the emergency room  with increasing respiratory distress and obvious apparent aspiration to  a slice of cheese for which the patient underwent Heimlich maneuver.   There has been recently increasing concerns of ability of her following  safety.  She was having cough, congestion and a low O2 sat and the  patient was in acute respiratory failure with O2 sat in 40s on nasal  cannula.  The patient was put on 100% nonrebreather mask.    PAST MEDICAL HISTORY:  Pertinent for Huntington's chorea, seasonal  allergy, osteopenia, GERD, asthma, anxiety, behavioral disorder with  agitation, tonsillectomy, wisdom tooth extraction, adenoidectomy and  sinus surgery.    FAMILY HISTORY:  Both parents are not living because of natural causes.   Father had Alzheimer's disease.  One of the sisters has depression.    MEDICATIONS:  Tylenol, Maalox, Dulcolax, Pepto-Bismol, Klonopin,  Enablex, Depakote, Flonase, Haldol, Culturelle, Imodium, milk of  magnesia, Zyprexa, Prilosec, Fleet Enema, Geodon.    ALLERGIES:  The patient is allergic to RITALIN, ZITHROMAX, BACTRIM,  ERYTHROMYCIN and LEXAPRO.    SOCIAL HISTORY:  She is a married woman.  They have two children.  She  is always a nonsmoker and always a nondrinker.  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  No history of substance  abuse.  She resides at [facility].  Her husband lives  at home, but he tends to her need  very well and regularly.    REVIEW OF SYSTEMS:  Shows excessive agitation, emotional outburst, quite  apparent aspiration of the food and liquids frequently.  The patient  does ambulate independently.  No recent incontinence.  Does have cough,  congestion and wheezing.  No chest pain.  No abdominal pain.  No  hematemesis.  No melena. Does have chronic musculoskeletal pain.    PHYSICAL EXAMINATION:  GENERAL:  Lethargic, incoherent, excessively agitated 62 year old white  woman looking older than her stated age.  VITAL SIGNS:  Temperature 100.8, blood pressure 117/68, respirations 32,  heart rate 120, went as high as 130.  O2 sat was 92% on high flow  oxygen.  HEENT:  Oral mucosa dry.  SKIN:  Warm and dry.  NECK:  Supple.  Mild jugular venous distention.  No carotid bruit.  No  lymphadenopathy.  No thyromegaly.  LUNGS:  Bronchovesicular breath pattern with few coarse crackles.  There  is some wheezing.  HEART:  Regular rate and rhythm.  S1, S2, tachycardic.  ABDOMEN:  Soft and nontender.  Bowel sounds present.  EXTREMITIES:  Shows trace edema.  NEUROLOGIC:  Does have some choreiform movements.  Does have generalized  neuromuscular weakness, total lack of coordination.  No acute focal  sensory motor deficit indicative of stroke.    LABORATORY EVALUATION:  Shows sodium of 145, potassium 5.0, chloride 97,  CO2 21, BUN 19, creatinine 0.8, GFR more than 60.  Lactic acid level is  3.2, calcium 9.1.  ProBNP level 59.  Cholesterol level 207, HDL 57, LDL  120, triglyceride 151.  Albumin 3.8, alkaline phosphatase 100.  Blood  sugar 131.  White blood cell count 9000, hemoglobin and hematocrit is  12.9 and 39.6, platelet count is 279.    DIAGNOSTIC DATA:  Chest x-ray consistent with multifocal pneumonia  consistent with aspiration.  CT scan of the chest with contrast, large  hiatal hernia with gastroesophageal reflux, focal areas of _____  segmental atelectasis, infiltrate evident on right middle lobe, left  lower lobe, right  lower lobe, low density soft tissue nodule posterior  left upper lobe corresponds to that seen radiographically, also appears  both infectious and inflammatory, all these findings must be followed  with a chest CT in six months to ensure stability.  Portable chest, 6 mm  pulmonary nodule demonstrated in central upper left lung projecting  between posterior right 6th and 7th ribs.    ASSESSMENT:  Acute respiratory failure, aspiration of food, acute  aspiration pneumonitis, Huntington's disease, agitation with behavioral  disturbance.    PLAN:  Get her admitted.  Treat her with IV hydration.  The patient will  need parenteral Geodon to control her behavioral agitation.  Nebulized  aerosol, supplemental oxygen.  IV Zosyn.  Speech eval.  Skip the diet,  n.p.o. at this time until the speech evaluation has been made.  While I  am dictating this, the speech evaluation has been made and the patient  has been started on dysphagia 1 diet with puree.        Waylan Rocher, MD    D: 08/18/2017 23:01:29       T: 08/19/2017 4:22:59     [id number]  Job#: [id number]     Doc#: [id number]    CC:

## 2017-08-18 NOTE — Progress Notes (Signed)
Assessment completed and documented. Husband bedside. Husband very adamant about pts taking medicaiton do to pts reaction from not taking it. Spoke with speech who will wait for husband to get back to room, husband step out for a minute, and will see her... Messaged sent to Dr Celine Mansesai regarding husband information about dosages for medications.,.. Waiting for response...Marland Kitchen.Marland Kitchen.Marland Kitchen. Will monitor.

## 2017-08-18 NOTE — Plan of Care (Signed)
Problem: Falls - Risk of:  Intervention: Assess potential safety hazards      Patient placed on fall Precautions per Lattie CornsMorse Fall Risk Assessment Scale. Fall risk armband on, orange blanket placed on foot of bed, S.A.F.E. sign displayed at door. Bed in locked and in lowest position, bed alarm armed and audible, call light and bedside table in reach. Patient acknowledges the need to call before getting out of bed.      Goal: Will remain free from falls  Will remain free from falls     Outcome: Ongoing  Pt will remain free from falls  Goal: Absence of physical injury  Absence of physical injury     Outcome: Ongoing  Pt will remain free from physical injury    Problem: Confusion - Acute:  Intervention: Assess causes of stress and possible ways to change them  Pt will be kept in calm environment      Problem: Injury - Risk of, Physical Injury:  Goal: Absence of physical injury  Absence of physical injury     Outcome: Ongoing  Pt will remain free from physical injury  Goal: Will remain free from falls  Will remain free from falls     Outcome: Ongoing  Pt will remain free from falls    Problem: Mood - Altered:  Goal: Mood stable  Mood stable    Outcome: Ongoing  Pt will maintain a level /stable mood    Problem: Nutrition  Intervention: Swallowing evaluation  Swallow eval was completed  Intervention: Aspiration precautions  Pt will be monitored for aspiration      Problem: Musculor/Skeletal Functional Status  Intervention: OT Evaluation/treatment  OT will work with pt  Intervention: PT Evaluation/treatment  Pt will work with pt      Problem: Airway Clearance - Ineffective:  Goal: Ability to maintain a clear airway will improve  Ability to maintain a clear airway will improve  Outcome: Ongoing  Pt will maintain a clear airway    Problem: Gas Exchange - Impaired:  Goal: Levels of oxygenation will improve  Levels of oxygenation will improve  Outcome: Ongoing  Pt will maintain a normal range pf O2    Problem:  Hyperthermia:  Goal: Ability to maintain a body temperature in the normal range will improve  Ability to maintain a body temperature in the normal range will improve  Outcome: Ongoing  Pt will be free from high temp

## 2017-08-18 NOTE — Progress Notes (Signed)
Physical Therapy    Facility/Department: [facility]  Initial Assessment/Discharge Summary    NAME: Danielle Lawrence  DOB: 1956/01/11  MRN: [id number]    Date of Service: 08/18/2017    Discharge Recommendations: IANA BUZAN scored a 19/24 on the AM-PAC short mobility form.  At this time, no further PT is recommended upon discharge due to pt being near baseline functional mobility. As such, pt is being d/c from acute PT caseload.  Recommend patient returns to prior setting with prior services.        PT Equipment Recommendations  Equipment Needed: No    Patient Diagnosis(es): The primary encounter diagnosis was Respiratory distress. Diagnoses of Aspiration pneumonia, unspecified aspiration pneumonia type, unspecified laterality, unspecified part of lung (HCC) and Hypoxia were also pertinent to this visit.     has a past medical history of Anxiety; Asthma; GERD (gastroesophageal reflux disease); Huntington's chorea (HCC); Osteopenia; and Seasonal allergies.   has a past surgical history that includes sinus surgery (2010); Tonsillectomy and adenoidectomy (1968); and Wisdom tooth extraction (1978).    Restrictions  Restrictions/Precautions  Restrictions/Precautions: Fall Risk, Modified Diet (high fall risk, Dysphagia I pureed diet)     Vision/Hearing  Vision: Within Functional Limits  Hearing: Within functional limits       Subjective  General  Chart Reviewed: Yes  Additional Pertinent Hx: Huntington's chorea  Response To Previous Treatment: Not applicable  Family / Caregiver Present: Yes (sister-in-laws)  Diagnosis: acute aspiration pneumonia  Follows Commands: Impaired  General Comment  Comments: Pt supine in bed upon arrival. Pt impulsive throughout session, suddenly moving in bed or standing up at EOB. Pt perseverating throughout session, initially on cream of wheat being brough to room, then on needing immodium, and then on finding her own clothes. Family present in room assisting with re-orienting pt.    Subjective  Subjective: Pt reporting no pain at this time.   Pain Screening  Patient Currently in Pain: Denies  Vital Signs  Patient Currently in Pain: Denies       Orientation  Orientation  Overall Orientation Status: Impaired  Orientation Level: Oriented to person;Disoriented to place;Disoriented to time;Disoriented to situation     Social/Functional History  Social/Functional History  Type of Home: Facility [facility])  Home Layout: One level  Home Access: Level entry  Bathroom Shower/Tub: Pension scheme manager: Handicap height  Bathroom Equipment: Grab bars in shower, Grab bars around toilet, Hand-held shower  ADL Assistance: Needs assistance (Per family and pt, pt requires total A for bathing and dressing)  Homemaking Responsibilities: No  Ambulation Assistance: Independent (Pt's family reports pt is very unsteady with ambulation and often bumps into walls-states no falls to their knowledge)  Transfer Assistance: Independent  Active Driver: No  Leisure & Hobbies: singing, activities  Additional Comments: Per family, no falls.     Objective  AROM RLE (degrees)  RLE AROM: WFL  AROM LLE (degrees)  LLE AROM : WFL  Strength RLE  Strength RLE: WFL  Comment: unable to formally assess due to inconsistent command following-grossly 4/5 based on observation of functional mobility  Strength LLE  Strength LLE: WFL  Comment: unable to formally assess due to inconsistent command following-grossly 4/5 based on observation of functional mobility  Tone RLE  RLE Tone: Normotonic  Tone LLE  LLE Tone: Normotonic  Motor Control  Gross Motor?: Exceptions  Comments: pt with intermittent impulsive movement throughout session  Sensation  Overall Sensation Status: WFL  Bed mobility  Supine to Sit: Stand by assistance  Sit to Supine: Stand by assistance  Scooting: Stand by assistance  Transfers  Sit to Stand: Contact guard assistance (4-5x from EOB)  Stand to sit: Contact guard assistance  Comment: Pt impulsive  with stands.   Ambulation  Ambulation?: Yes  Ambulation 1  Surface: level tile  Device: No Device  Assistance: Dependent/Total (CGA x2>>Min Ax2 due to impulsive mobility)  Quality of Gait: wide BoS, decreased B foot clearance, increased sway  Distance: 3-4'  Comments: Pt impulsive, initially ambulating around EOB and then lunging back onto bed and crawling over bed to get to food tray that had just arrived.   Stairs/Curb  Stairs?: No     Balance  Posture: Good  Sitting - Static: Fair;+  Sitting - Dynamic: Fair;+ (CGA seated at EOB while eating lunch, bathing, and gown change ~20-25 minutes total)  Standing - Static: Fair;-  Standing - Dynamic: Fair;-        Assessment   Assessment: Pt presents at baseline functional mobility and does not require skilled PT services at this time. As such, pt is being d/c from acute PT caseload.   Prognosis: Fair  Decision Making: High Complexity  Clinical Presentation: unpredictable  Patient Education: PT eval, d/c recommendations  Barriers to Learning: emotional, cognitive  No Skilled PT: At baseline function  REQUIRES PT FOLLOW UP: No  Activity Tolerance  Activity Tolerance: Treatment limited secondary to agitation  Activity Tolerance: Extent of therapy evaluation limited due to pt's perseveration on food and other needs during session.          Plan   Plan  Times per week: d/c  Safety Devices  Type of devices: All fall risk precautions in place, Call light within reach, Bed alarm in place, Gait belt, Nurse notified, Left in bed, Patient at risk for falls Clydie Braun, RN, aware)  Restraints  Initially in place: No    G-Code     OutComes Score    AM-PAC Score  AM-PAC Inpatient Mobility Raw Score : 19  AM-PAC Inpatient T-Scale Score : 45.44  Mobility Inpatient CMS 0-100% Score: 41.77  Mobility Inpatient CMS G-Code Modifier : CK          Goals  Short term goals  Time Frame for Short term goals: none, d/c       Therapy Time   Individual Concurrent Group Co-treatment   Time In 1150          Time Out 1238         Minutes 48              Timed Code Treatment Minutes:   33    Total Treatment Minutes:  [address], PT   [place], [place] [id number]

## 2017-08-18 NOTE — Progress Notes (Signed)
CLINICAL BEDSIDE SWALLOWING EVALUATION  Speech Therapy Department    Patient Name:  Danielle Lawrence  DOB:  [birth year]  Pain level:denies at this time   Medical Diagnosis:   Acute aspiration pneumonia (HCC) [J69.0]  Acute aspiration pneumonia (HCC) [J69.0]    HPI: "Briefly, this is a 62 y.o. female here for Respiratory distress.  Patient has Huntington's disease and often has some dysphasia and soft food diet.  Today she had a choking episode, while apparently eating baked ziti, Heimlich maneuver was administered.  She arrives today with 100% nonrebreather applied with oxygen at 95%.  Denies chest pain or pressure.  Symptoms were moderate.  See APP note"  CT chest:  1. Large hiatal hernia with gastroesophageal reflux. Focal areas of   subsegmental atelectasis are seen medial both lower lungs related to hiatal   hernia.   2. Infiltrate evident right middle lobe, left lower lobe and right lower   lobe, presumably pneumonitis.   3. Low-density soft tissue nodule posterior left upper lobe corresponds to   that seen radiographically, almost certainly on a postinfectious/inflammatory   basis, however this is nonspecific and should be followed with CT chest in 6   months to ensure stability*.   4. Hepatic steatosis.       SLP evaluation orders received. Pt with dx of huntington's disease. Hx was gathered from husband and chart review. Pt with hx of dysphagia. Modified Barium Swallow was completed at outside hospital in 2017 which revealed aspiration with thin liquids via cup. Recommendations at that time were soft solids with nectar thick liquids. Husband reported that per SLP at current facility pt has been on a mechanical soft diet with thin liquids with meds with puree. Pt stated pt is impulsive with intake but "if she goes slow and tucks her chin down she does fine." Per chart, pt was eating baked ziti and choked requiring heimlich maneuver and CPR. Pt able to recall choking and is afraid to eat solids.     Treatment  Diagnosis: Moderate Oropharyngeal Dysphagia    Impressions: Pt was positioned upright in bed by SLP with O2 via nasal cannula. Pt very anxious and agitated throughout assessment. Prior to PO trials pt began coughing and then screaming out "I'm choking" repeatedly. Once coughing finally ceased pt began screaming out "I want pureed food." "I need applesauce and yogurt." Husband reports that due to huntington's pt becomes perseverative, is impulsive and easily agitated. Oral mechanism examination revealed reduced ROM and coordination pt with involuntary movements of face and lips. Various consistency trials were provided to assess swallow function. Pt was extremely impulsive with self feeding. Dysphagia was characterized by impaired mastication, suspected rapid premature bolus loss, delayed swallow initiation and delayed pharyngeal clearing with multiple swallows to clear. No overt clinical s/s of aspiration/penetration were assessed this date however, pt demonstrates increased risk for episodic aspiration/choking due to impulsivity, rapid bolus loss, suspected reduced airway protection and hx of aspiration. Recommend initiation of diet.     Dietary Recommendations:  Dysphagia I Pureed with thin liquids, meds with puree, recommend supervision with intake   If respiratory status declines recommend downgrade to NPO pending re-assessment of swallow function     Strategies: 90 degree positioning with all p.o. intake; small bites/sips; alternate textures through meal; reduce rate of intake    Treatment/Goals: Speech therapy for dysphagia tx 3-5 times per week.     STG:   1.) Pt will tolerate recommended diet without s/s of aspiration  Oral motor Exam:  Dentition: adequate   Labial/Facial: Decreased ROM and coordination, involuntary movement of face and lips    Lingual: decreased coordination   Voice: dysarthria     Oral Phase:   Reduced/discoordinated mastication  Reduced A-P propulsion  Apparent premature bolus loss to  pharynx  Decreased bolus cohesion   Suspected rapid premature bolus loss     Pharyngeal Phase:  Apparent pharyngeal pooling  Delayed swallow initiation  Decreased laryngeal elevation via palpation   Impaired pharyngeal clearing     Patient/Family Education:Education, results and recommendations given to the Pt and nurse, who verbalized understanding    Timed Code Treatment: 0 minutes     Total Treatment Time: 40 minutes     Discharge Recommendations: Speech Therapy for Speech/Dysphagia treatment at discharge.    Signature:Caden Fatica StickneyKennedy, [place] CCC-SLP [phone]#[id number]  Speech Language Pathologist

## 2017-08-18 NOTE — Progress Notes (Signed)
Occupational Therapy   Occupational Therapy Initial Assessment/Discharge Summary  Date: 08/18/2017   Patient Name: Danielle Lawrence  MRN: [id number]     DOB: [birth year]    Date of Service: 08/18/2017    Discharge Recommendations:  Danielle Lawrence scored a 17/24 on the AM-PAC ADL Inpatient form.  At this time, no further OT is recommended upon discharge due to pt at baseline level of occupational function.  Recommend patient returns to prior setting with prior services.         OT Equipment Recommendations  Equipment Needed: No      Patient Diagnosis(es): The primary encounter diagnosis was Respiratory distress. Diagnoses of Aspiration pneumonia, unspecified aspiration pneumonia type, unspecified laterality, unspecified part of lung (HCC) and Hypoxia were also pertinent to this visit.     has a past medical history of Anxiety; Asthma; GERD (gastroesophageal reflux disease); Huntington's chorea (HCC); Osteopenia; and Seasonal allergies.   has a past surgical history that includes sinus surgery (2010); Tonsillectomy and adenoidectomy (1968); and Wisdom tooth extraction (1978).           Restrictions  Restrictions/Precautions  Restrictions/Precautions: Fall Risk, Modified Diet (high fall risk, Dysphagia I pureed diet)    Subjective   General  Chart Reviewed: Yes  Family / Caregiver Present: Yes (2 sisters-in-law)  Referring Practitioner: Sherlyn Hay, MD, for d/c planning  Diagnosis: Acute aspiration PNA  Subjective  Subjective: Pt supine in bed with sisters-in-law present. Pt began perseverating on getting cream of wheat soon after therapy arrival. Pt yelling and upset as her cream of wheat wasn't here. Periodically perseverated on topics/needs during session. Distracted by singing and bathing, calmer after she ate.  Pain Assessment  Patient Currently in Pain: Denies  Pre Treatment Pain Screening  Intervention List: Patient able to continue with treatment  Social/Functional History  Social/Functional History  Type of Home:  Facility [facility])  Home Layout: One level  Home Access: Level entry  Bathroom Shower/Tub: Pension scheme manager: Handicap height  Bathroom Equipment: Grab bars in shower, Grab bars around toilet, Hand-held shower  ADL Assistance: Needs assistance (Per family and pt, pt requires total A for bathing and dressing)  Homemaking Responsibilities: No  Ambulation Assistance: Independent (Pt's family reports pt is very unsteady with ambulation and often bumps into walls-states no falls to their knowledge)  Transfer Assistance: Independent  Active Driver: No  Leisure & Hobbies: singing, activities  Additional Comments: Per family, no falls.        Objective   Vision: Within Functional Limits  Hearing: Within functional limits    Orientation  Overall Orientation Status: Impaired  Orientation Level: Oriented to person;Disoriented to place;Disoriented to time;Disoriented to situation     Balance  Standing Balance: Dependent/Total (Min/Mod A of 2 for safety with dynamic mobility in room; CGA/Min A static)  Standing Balance  Time: Multiple short times less than 1 min  Activity: evaluation, peri hygiene  Sit to stand: Contact guard assistance  Stand to sit: Contact guard assistance  Comment: Pt very unpredictable, unsafe with mobilitY, moves quickly. Pt's family states this is normal.  Functional Mobility  Assist Level: Dependent/Total (Min/Mod A of 2 for safety for dynamic mobility in room without AD)  ADL  Feeding: Stand by assistance (verbal cues to slow down; pt eating/drinking very quickly, coughing after drinking soon)  Grooming: Minimal assistance (to apply toothpaste to brush; pt began brushing her teeth without toothpaste; unaware)  UE Bathing: Stand by assistance (with wipes, not  thorough)  UE Dressing: Dependent/Total (gown)  LE Dressing: Contact guard assistance;Minimal assistance (standing balance to don pullups; able to reach feet w/LEs bent on EOB)  Tone RUE  RUE Tone: Normotonic  Tone  LUE  LUE Tone: Normotonic  Coordination  Movements Are Fluid And Coordinated: No  Coordination and Movement description: Fine motor impairments;Right UE;Left UE     Bed mobility  Supine to Sit: Stand by assistance  Sit to Supine: Stand by assistance  Scooting: Stand by assistance  Transfers  Sit to stand: Contact guard assistance  Stand to sit: Contact guard assistance  Vision - Basic Assessment  Prior Vision: No visual deficits  Visual History: No significant visual history  Patient Visual Report: No visual complaint reported.  Cognition  Overall Cognitive Status: Exceptions  Arousal/Alertness: Appropriate responses to stimuli  Following Commands: Follows one step commands consistently  Attention Span: Attends with cues to redirect;Difficulty dividing attention  Memory:  (Difficult to assess fully; pt distractable, difficult to understand; enjoyed singing songs)  Safety Judgement: Decreased awareness of need for safety;Decreased awareness of need for assistance  Problem Solving: Decreased awareness of errors;Assistance required to correct errors made;Assistance required to identify errors made  Insights: Decreased awareness of deficits  Initiation: Does not require cues  Sequencing: Does not require cues  Cognition Comment: Very unsafe, impulsive  Perception  Overall Perceptual Status: Impaired  Perseveration: Perseverates during conversation (Perseverating on wanting Cream of Wheat, Immodium, getting her clothes; eventually redirected, but difficult)     Sensation  Overall Sensation Status: WFL        LUE AROM (degrees)  LUE AROM : WFL  Left Hand AROM (degrees)  Left Hand AROM: WFL  RUE AROM (degrees)  RUE AROM : WFL  Right Hand AROM (degrees)  Right Hand AROM: WFL  LUE Strength  Gross LUE Strength: WFL  L Hand Grasp: 4+/5  RUE Strength  Gross RUE Strength: WFL  R Hand Grasp: 4+/5                  Assessment   Performance deficits / Impairments: Decreased functional mobility ;Decreased ADL status;Decreased safe  awareness;Decreased cognition  Assessment: Pt is at her baseline level of occupational function with the above deficits.  Decision Making: High Complexity  History: Pt lives [facility]; pt gets assist with ADLs, unsteady with ambulation @ baseline. PMH: Huntington's Chorea, anxiety, osteopenia  Exam: ROM, MMT, 6 clicks, 4 performance deficits, unstable presentation  Assistance / Modification: Min/Mod A of 2 functional mobility for safety, CGA/Min A LB dressing  Patient Education: OT eval, ADLs, safe standing, sitting on EOB, safety during feeding, slowing down, swallowing  Barriers to Learning: Cognition, agitation  REQUIRES OT FOLLOW UP: No  Activity Tolerance  Activity Tolerance: Patient Tolerated treatment well;Treatment limited secondary to agitation;Treatment limited secondary to decreased cognition  Safety Devices  Safety Devices in place: Yes  Type of devices: Left in bed;Nurse notified;Call light within reach;Gait belt;Patient at risk for falls;Bed alarm in place (Family present)         Plan   Plan  Plan Comment: D/C acute OT    G-Code     OutComes Score                                                  AM-PAC Score  AM-PAC Inpatient Daily Activity Raw Score: 17  AM-PAC Inpatient ADL T-Scale Score : 37.26  ADL Inpatient CMS 0-100% Score: 50.11  ADL Inpatient CMS G-Code Modifier : CK    Goals: None          Therapy Time   Individual Concurrent Group Co-treatment   Time In 1151         Time Out 1239         Minutes 48               Timed Code Treatment Minutes:   33    Total Treatment Minutes: [address], OT   [place], OTR/L, [id number]

## 2017-08-18 NOTE — Unmapped (Signed)
Called and left message for patient's spouse Kathlene November. Looking at her medication list at [facility], all of the medications look consistent with what I had changed them to back in December (I had increased Geodon, haldol deconate and depakote). I told him if the physician there had any questions to give our office a call back.

## 2017-08-18 NOTE — Telephone Encounter (Signed)
Spoke with pt's spouse Brenda [name]. States that pt is currently admitted at [facility]. States that the medication list is not correct according to what MD had personally told him in December. States that treating MD would like to speak with MD. Informed him to call treating MD call office to reach MD. Voiced understanding. Call completed

## 2017-08-18 NOTE — Unmapped (Signed)
Brenda [name] spouse [name] states patient is at [facility] , patient chocked on some noodles, heimlich maneuver was done to dislodge noodles.  Patient rushed to [facility] where she went into cardiac arrest and CPR was performed on patent.  States the doctors at [facility] are in conflict with patient's current medication dosages   [name] states he really needs to speak with MD as son as possible  [name] can be reached at [phone]

## 2017-08-19 LAB — CBC
Hematocrit: 38.1 % (ref 36.0–48.0)
Hemoglobin: 12.5 g/dL (ref 12.0–16.0)
MCH: 28.8 pg (ref 26.0–34.0)
MCHC: 32.8 g/dL (ref 31.0–36.0)
MCV: 88 fL (ref 80.0–100.0)
MPV: 8.1 fL (ref 5.0–10.5)
Platelets: 243 10*3/uL (ref 135–450)
RBC: 4.33 M/uL (ref 4.00–5.20)
RDW: 14.5 % (ref 12.4–15.4)
WBC: 10.7 10*3/uL (ref 4.0–11.0)

## 2017-08-19 LAB — BASIC METABOLIC PANEL
Anion Gap: 14 (ref 3–16)
BUN: 8 mg/dL (ref 7–20)
CO2: 24 mmol/L (ref 21–32)
Calcium: 9.6 mg/dL (ref 8.3–10.6)
Chloride: 106 mmol/L (ref 99–110)
Creatinine: 0.7 mg/dL (ref 0.6–1.2)
GFR African American: >60 (ref >60)
GFR Non-African American: >60 (ref >60)
Glucose: 103 mg/dL — ABNORMAL HIGH (ref 70–99)
Potassium: 4.6 mmol/L (ref 3.5–5.1)
Sodium: 144 mmol/L (ref 136–145)

## 2017-08-19 LAB — LACTIC ACID: Lactic Acid: 1.2 mmol/L (ref 0.4–2.0)

## 2017-08-19 MED ORDER — IPRATROPIUM-ALBUTEROL 0.5-2.5 (3) MG/3ML IN SOLN
RESPIRATORY_TRACT | 1 refills | Status: AC | PRN
Start: 2017-08-19 — End: ?

## 2017-08-19 MED ORDER — CLONAZEPAM 1 MG PO TABS
1 MG | ORAL_TABLET | Freq: Four times a day (QID) | ORAL | 0 refills | Status: AC | PRN
Start: 2017-08-19 — End: 2017-08-26

## 2017-08-19 MED ORDER — AMOXICILLIN-POT CLAVULANATE 875-125 MG PO TABS
875-125 MG | Freq: Two times a day (BID) | ORAL | Status: DC
Start: 2017-08-19 — End: 2017-08-19
  Administered 2017-08-19: 15:00:00 1 via ORAL

## 2017-08-19 MED ORDER — AMOXICILLIN-POT CLAVULANATE 875-125 MG PO TABS
875-125 MG | ORAL_TABLET | Freq: Two times a day (BID) | ORAL | 0 refills | Status: AC
Start: 2017-08-19 — End: 2017-08-29

## 2017-08-19 MED FILL — LOVENOX 40 MG/0.4ML SC SOLN: 40 MG/0.4ML | SUBCUTANEOUS | Qty: 0.4

## 2017-08-19 MED FILL — PANTOPRAZOLE SODIUM 40 MG PO TBEC: 40 mg | ORAL | Qty: 1

## 2017-08-19 MED FILL — DIVALPROEX SODIUM 125 MG PO CSDR: 125 mg | ORAL | Qty: 4

## 2017-08-19 MED FILL — MAPAP 325 MG PO TABS: 325 mg | ORAL | Qty: 2

## 2017-08-19 MED FILL — CLONAZEPAM 1 MG PO TABS: 1 mg | ORAL | Qty: 1

## 2017-08-19 MED FILL — AMOXICILLIN-POT CLAVULANATE 875-125 MG PO TABS: 875-125 mg | ORAL | Qty: 1

## 2017-08-19 MED FILL — GEODON 20 MG IM SOLR: 20 mg | INTRAMUSCULAR | Qty: 20

## 2017-08-19 MED FILL — CULTURELLE PO CAPS: ORAL | Qty: 1

## 2017-08-19 MED FILL — SODIUM CHLORIDE 0.9 % IV SOLN: 0.9 % | INTRAVENOUS | Qty: 1000

## 2017-08-19 MED FILL — TROSPIUM CHLORIDE 20 MG PO TABS: 20 mg | ORAL | Qty: 1

## 2017-08-19 MED FILL — OLANZAPINE 5 MG PO TABS: 5 mg | ORAL | Qty: 1

## 2017-08-19 NOTE — Other (Signed)
Patient pulled out iv three times and pulls off tele. Pt has Huntington's. Dr Celine Mansesai notified. See orders  Jalyric Kaestner Jodi Geralds Masin Shatto, RN

## 2017-08-19 NOTE — Plan of Care (Signed)
Problem: Airway Clearance - Ineffective:  Goal: Ability to maintain a clear airway will improve  Ability to maintain a clear airway will improve   Outcome: Ongoing  Patient has been able to keep airway clean during meals. Speech Therapy was in during breakfast. Patient will continue pureed diet. Will continue to monitor patient

## 2017-08-19 NOTE — Progress Notes (Signed)
Data- discharge order received, pt and husband (appointed legal authority) verbalized agreement to discharge, [facility] #[phone], COC reviewed and signed by physician.    Action- AVS prepared, COC completed/ reported faxed by case management/social services. Discharge instruction summary: Diet- Puree Thin Liquids, Activity- Resume as tolerated, immunizations reviewed and current, medications prescriptions to be filled at receiving facility except for the controlled prescriptions to be sent: Klonopin, Transfer code status: Full Code, LDAs to remain with discharge: none.   DME used: none.     Response- Bedside RN to call report to receiving facility. Pt belongings gathered, peripheral IV and cardiac monitoring removed. Disposition to Discharged via cart/stretcher and via ambulance to skilled nursing by family, no complications reported.

## 2017-08-19 NOTE — Progress Notes (Signed)
Speech Language Pathology    Discharge  Name: Danielle Lawrence  DOB: [birth year]  Medical Diagnosis: Acute aspiration pneumonia ([place]) [J69.0]  Acute aspiration pneumonia ([place]) [J69.0]  Treatment Diagnosis: Dysphagia    Speech Therapy/Dysphagia  Pt discharged to [facility] on 08/19/2017. Bedside Swallow Eval completed 08/18/2017 (see report). No goals met prior to discharge. Recommend: Continued Dysphagia treatment at [facility], with goals and treatment per Plan of Care.    DIET LEVEL AT DISCHARGE:Dysphagia I Pureed with thin liquids, meds with puree, recommend supervision with intake   If respiratory status declines recommend downgrade to NPO pending re-assessment of swallow function     GOALS ADDRESSED:  1.) Pt will tolerate recommended diet without s/s of aspiration (GOAL NOT MET)      Joaquim Lai, [place]  Speech Language Pathologist

## 2017-08-19 NOTE — Care Coordination-Inpatient (Addendum)
Notified MD via perfect serve, Patient discharged 08-19-17 to [facility] at 7:00pm pending a Discharge order.   Luretha Murphy [phone], [phone], fax [phone]. Spouse is aware and agreeable.  All discharge needs met per case management  MD, Please complete & sign the e-COC under the discharge instructions, AVS/Prescriptions, and or medical necessity note for assistance with discharge planning, Thank you, Sofie Hartigan, MSW, [phone]

## 2017-08-19 NOTE — Plan of Care (Signed)
Problem: Falls - Risk of:  Intervention: Assess risk factors for falls  Patient is medium fall risk      Problem: Sleep Pattern Disturbance:  Intervention: Sleep assessment  Patient sleeping at this time.      Problem: Gas Exchange - Impaired:  Goal: Levels of oxygenation will improve  Levels of oxygenation will improve   O2 saturation is 91% on room air

## 2017-08-19 NOTE — Progress Notes (Signed)
Patient seen , discharge dictated scripts given , arrangements made , COC completed . Discussed with nursing staff  And case managers  If applicable ,  Discussed with  Patient's family , all questions answered and concerns addressed  When applicable

## 2017-08-19 NOTE — Discharge Instructions (Signed)
.   Good nutrition is important when healing from an illness, injury, or surgery.  Follow any nutrition recommendations given to you during your hospital stay.   . If you were given an oral nutrition supplement while in the hospital, continue to take this supplement at home.  You can take it with meals, in-between meals, and/or before bedtime. These supplements can be purchased at most local grocery stores, pharmacies, and chain super-stores.   . If you have any questions about your diet or nutrition, call the hospital and ask for the dietitian.

## 2017-08-19 NOTE — Plan of Care (Signed)
Problem: Confusion - Acute:  Goal: Absence of continued neurological deterioration signs and symptoms  Absence of continued neurological deterioration signs and symptoms    Outcome: Completed Date Met: 08/19/17  Patient back to baseline. Ready for D/C labs back to normal.    Problem: Discharge Planning:  Goal: Ability to perform activities of daily living will improve  Ability to perform activities of daily living will improve    Outcome: Completed Date Met: 08/19/17  Patient will be returning to [facility] skilled facility to help with assisted daily livings.     Problem: Mood - Altered:  Goal: Absence of abusive behavior  Absence of abusive behavior    Outcome: Completed Date Met: 08/19/17  Pt back to baseline agitation. Upset about not receiving Imodium, Family at bedside and calmed patient down. Patient is currently relaxing in bed.    Problem: Psychomotor Activity - Altered:  Goal: Absence of psychomotor disturbance signs and symptoms  Absence of psychomotor disturbance signs and symptoms    Outcome: Completed Date Met: 08/19/17  Patient is back to baseline. Patient is calm lying in bed awaiting transfer back to [facility] skilled facility.    Problem: Sensory Perception - Impaired:  Goal: Demonstrations of improved sensory functioning will increase  Demonstrations of improved sensory functioning will increase    Outcome: Completed Date Met: 08/19/17  Patient is back to baseline prior to D/C    Problem: Sleep Pattern Disturbance:  Goal: Appears well-rested  Appears well-rested    Outcome: Completed Date Met: 08/19/17  Patient received adequate rest throughout. Appears well rested on D/C    Problem: Risk for Impaired Skin Integrity  Goal: Tissue integrity - skin and mucous membranes  Structural intactness and normal physiological function of skin and  mucous membranes.   Outcome: Completed Date Met: 08/19/17  Patient moved self independently while lying in bed, while changing positions with reminders, ambulated to the  bathroom with assistance. Continue to monitor after discharge

## 2017-08-19 NOTE — Progress Notes (Signed)
Speech Language Pathology  Dysphagia Treatment Note    Name:  Danielle Lawrence  DOB:   10-Dec-1955  Medical Diagnosis:  Acute aspiration pneumonia (HCC) [J69.0]  Acute aspiration pneumonia (HCC) [J69.0]  Treatment Diagnosis: Oropharyngeal Dysphagia  Pain level:  Pt did not report pain    Current Diet Level:   -Dysphagia I Pureed with thin liquids, meds with puree, recommend supervision with intake   -If respiratory status declines recommend downgrade to NPO pending re-assessment of swallow function     Tolerance of Current Diet Level: Per chart review, no noted difficulty with current diet however patient remains impulsive with self-feeding    Assessment of Texture Tolerance:  -Impressions: Pt was seen sitting upright on edge of bed while consuming breakfast meal.  Pt was impulsive with self feeding taking large bolus amounts of puree and thin liquids.  Pt was intermittently noted to talk with bolus in oral cavity increasing risk of aspiration/penetration.  Puree trials revealed reduced oral manipulation with patient rapidly swallowing bolus prior to adequate bolus prep.  Thin liquids via cup revealed successive sips with delayed swallow initiation.  Occasional coughing was noted throughout meal due to fast rate of intake and large bolus amounts.  Minimal ability to reduce bolus size and rate of intake was noted given verbal cues.  At this time, continuation of current diet is recommended with supervision during meals and diet tolerance monitoring.  Based on patients cognitive status and impulsivity with self-feeding she is at risk for aspiration with all PO intake.  If difficulty is noted, recommend downgrade to NPO with ongoing swallowing assessment.      Diet and Treatment Recommendations:  1.) Continue current diet with diet tolerance monitoring and supervision with all meals  2.) If difficulty is noted recommend downgrade to NPO with ongoing swallowing assessment    Dysphagia Goals:  1.) Pt will tolerate recommended  diet without s/s of aspiration (ongoing 08/19/2017)     Plan:  Continued daily Dysphagia treatment with goals per plan of care.    Patient/Family Education:Education given to the Pt and nurse, who verbalized understanding    Discharge Recommendations:  Pt will benefit from continued skilled Speech Therapy for Dysphagia services, prior to returning home.    Timed Code Treatment: 0 minutes    Total Treatment Time: 18 minutes    Dellia CloudLaura Emersen Carroll, M.A., CCC-SLP [phone]  Speech-Language Pathologist

## 2017-08-19 NOTE — Discharge Summary (Signed)
[facility]                     [address] [place], [place] [address]                               DISCHARGE SUMMARY    PATIENT NAME: Danielle Lawrence, Danielle Lawrence                        DOB:        [birth year]  MED REC NO:   [id number]                          ROOM:       3361  ACCOUNT NO:   [id number]                           ADMIT DATE: 08/17/2017  PROVIDER:     Waylan Rocher, MD                  DISCHARGE DATE:  08/19/2017    FINAL DIAGNOSES:  1.  Acute respiratory failure.  2.  Aspiration of food.  3.  Acute aspiration pneumonitis.  4.  Huntington's disease.  5.  Agitation with behavioral disturbance.    DISCHARGE MEDICATIONS:  1.  DuoNeb 1 every four hours p.r.n.  2.  Clonazepam 1 mg every six hours p.r.n. for anxiety.  3.  Augmentin 875 p.o. b.i.d. for 10 days.  4.  Tylenol 650 q. 4 hours p.r.n.  5.  Mylanta 30 mL every 4 hours p.r.n.  6.  Klonopin 1 mg every 6 hours p.r.n. for agitation.  7.  Depakote 500 mg p.o. b.i.d.  8.  Bisacodyl suppository p.r.n. for constipation.  9.  Enablex 7.5 mg daily.  10.  Geodon 10 mg every 6 hours p.r.n. as needed for agitation.  11.  Flonase 50 mcg each nostril daily.  12.  Haldol 100 mg in to muscle every 30 days.  13.  _____ 0.5 mg by mouth every 6 hours p.r.n.  14.  Loperamide 2 mg four times a day as needed for diarrhea.  15.  Milk of magnesia 30 mL q.h.s. p.r.n. for constipation.  16.  Pepto-Bismol as directed.  17.  Zyprexa 5 mg two times a day.  18.  Lactobacillus acidophilus one capsule every day.  19.  Klonopin 2 mg every morning before breakfast was discontinued.    HOSPITAL COURSE:  This elderly woman with Huntington's disease and  severe behavioral agitation came to the emergency room with history of  possible aspiration.  The patient was in acute respiratory failure, was  diagnosed to have acute aspiration pneumonitis.  The patient's  temperature was 100.8, blood pressure 117/68, respirations 32, heart  rate 120, it went as high as 130, O2  sat was 92% on high flow oxygen.   The patient was given IV antibiotic, nebulized aerosol, supplemental  oxygen.  CT scan of the chest with contrast shows large hiatal hernia,  gastroesophageal reflux, focal area of segmental atelectasis and  infiltrate evident on the right middle lobe and left lower lobe  consistent with aspiration pneumonitis.  The patient also had pulmonary  nodule demonstrated in the central upper left lung projecting between  posterior right 6th and 7th ribs.  The patient was treated with IV  hydration, IV antibiotic, nebulized aerosol, supplemental oxygen, was  given IV Zosyn, PT, OT and speech was also given.  The patient has been  started on dysphagia 1 diet with puree based on speech pathologist  evaluation.  After three days of treatment, the patient was started on  Augmentin.  General condition was stabilized.  The patient was safely  discharged.  Lucinda DellNancy Schuster made necessary arrangement for the patient  to [facility].  _____ completed.   Prescriptions were sent as needed.        Waylan RocherSHWETAL Marilin Kofman, MD    D: 09/25/2017 10:56:42       T: 09/26/2017 4:52:33     [id number]  Job#: [id number]     Doc#: [id number]    CC:

## 2017-08-19 NOTE — Progress Notes (Signed)
Assessment completed see doc flow sheets.  VSS, Patient has no reports of pain or discomfort. Patient removed iv and tele.  Patient cooperative at this time. Bed alarm on for safety.    Patient encouraged to call nurse if assistance is needed. Call light in reach . Vernice Jeffersonawn Rein Popov, BSN, RN

## 2017-08-19 NOTE — Plan of Care (Signed)
Problem: Falls - Risk of:  Goal: Will remain free from falls  Will remain free from falls     Outcome: Ongoing  Patient is a medium fall risk. Bed/Chair alarms are in place, non skid socks, 2/4 side rails are up, and call light is within reach. Will continue to monitor patient.     Problem: Injury - Risk of, Physical Injury:  Goal: Will remain free from falls  Will remain free from falls     Outcome: Ongoing  Patient is a medium fall risk. Bed/Chair alarms are in place, non skid socks, 2/4 side rails are up, and call light is within reach. Will continue to monitor patient.

## 2017-08-19 NOTE — Plan of Care (Signed)
Problem: Pain:  Goal: Pain level will decrease  Pain level will decrease   Outcome: Ongoing  Patient c/o "bone pain" around abdominal area as a 10. Patient was given tylenol to help relieve pain. Patient is currently sleeping at pain reassessment. Will continue to monitor patient pain level.

## 2017-08-22 LAB — CULTURE, BLOOD 2: Culture, Blood 2: NO GROWTH

## 2017-08-22 LAB — CULTURE BLOOD #1: Blood Culture, Routine: NO GROWTH

## 2017-08-25 ENCOUNTER — Ambulatory Visit: Admit: 2017-08-25 | Payer: PRIVATE HEALTH INSURANCE | Attending: Neurology

## 2017-08-25 DIAGNOSIS — G10 Huntington's disease: Secondary | ICD-10-CM

## 2017-08-25 NOTE — Unmapped (Signed)
Subjective:      Patient ID: Brenda [name] is a 62 y.o. female.    HPI   Movement Disorders HPI  The patient presents for f/u of HD. I have not seen her since 2017. She was recently hospitalized after a choking episode at her nursing facility. She is now on a pureed diet. She is fixated on choking otherwise behavioral and psychiatric issues are now under control on her current medication regimen. She has not had any recent falls. She has gained some weight and has been eating well. She is sleeping well at night. Chorea is not an issue at this time.          Histories:     She has a past medical history of Anxiety; Asthma; Chronic sinusitis (05/24/2011); Environmental allergies; Esophageal reflux (08/10/2010); Fatigue (05/24/2011); Fracture of left orbit (CMS Dx) (2010); Hematoma, left eyebrow (04/18/2009); Huntington's chorea (CMS Dx) (08/11/2010); Osteopenia (08/10/2010); and Superficial injury of face without infection (04/12/2009).    She has a past surgical history that includes Sinus surgery and Tonsillectomy.    Her family history includes Alzheimer's disease in her father; Anxiety disorder in her sister; Depression in her sister; Huntington's disease in her maternal uncle; Huntington's disease (age of onset: 68) in her mother; Pulmonary embolism in her mother; Suicidality in her sister.    She reports that she has never smoked. She has never used smokeless tobacco. She reports that she drinks about 0.6 oz of alcohol per week . She reports that she does not use drugs.      Review of Systems: As in HPI    Allergies:   Zithromax [azithromycin]; Bactrim [sulfamethoxazole-trimethoprim]; Erythromycin; Erythromycin base; Methylphenidate; and Sulfa (sulfonamide antibiotics)    Medications:     Outpatient Encounter Prescriptions as of 08/25/2017   Medication Sig Dispense Refill   ??? acetaminophen (TYLENOL) 325 MG tablet Take 650 mg by mouth every 6 hours as needed.     ??? calcium carbonate (TUMS) 200 mg calcium (500 mg)  chewable tablet Chew 1 tablet by mouth at bedtime as needed for Heartburn.     ??? cholecalciferol, vitamin D3, 1000 units tablet Take 1,000 Units by mouth daily.     ??? clonazePAM (KLONOPIN) 1 MG tablet Take 1 mg by mouth every 6 hours.     ??? clonazePAM (KLONOPIN) 2 MG tablet Take 2 mg by mouth daily.     ??? darifenacin (ENABLEX) 7.5 MG 24 hr tablet TAKE 1 TABLET BY MOUTH EVERY DAY 30 tablet 0   ??? divalproex sodium (DEPAKOTE ORAL) Take 500 mg by mouth 2 times a day.     ??? fluticasone (FLONASE) 50 mcg/actuation nasal spray Use 1 spray into each nostril daily.     ??? haloperidol (HALDOL) 0.5 MG tablet Take 0.5 mg by mouth every 6 hours.     ??? haloperidol decanoate (HALDOL DECANOATE) 100 mg/mL injection Inject 100 mg into the muscle every 28 days.     ??? multivitamin capsule Take 1 capsule by mouth daily.     ??? nitroGLYCERIN (NITROSTAT) 0.4 MG SL tablet Place 0.4 mg under the tongue every 5 minutes as needed for Chest pain. Dissolve 1 tablet under the tongue for chest pain.  Repeat if needed every 5 minutes for 2 more doses.     ??? olanzapine zydis (ZYPREXA) 5 MG disintegrating tablet Take 5 mg by mouth every 12 hours.     ??? omeprazole (PRILOSEC) 20 MG capsule Take 40 mg by mouth every morning  before breakfast.     ??? ziprasidone (GEODON) injection Inject 10 mg into the muscle every 6 hours as needed.               ??? [DISCONTINUED] divalproex (DEPAKOTE SPRINKLE) 125 mg capsule TAKE 2 CAPSULES BY MOUTH TWO TIMES DAILY (Patient taking differently: 500 mg twice a day) 60 capsule 2   ??? [DISCONTINUED] clonazePAM (KLONOPIN) 0.5 MG tablet Take 1 tablet at bedtime scheduled and 1/2 tablet up to 4 times daily prn anxiety/agitation.     ??? [DISCONTINUED] gabapentin (NEURONTIN) 300 MG capsule Take 2 capsules (600 mg total) by mouth 3 times a day. Midday dose is as needed for agitation 540 capsule 0   ??? [DISCONTINUED] mirabegron (MYRBETRIQ) 50 mg Tb24 Take 50 mg by mouth daily.     ??? [DISCONTINUED] QUEtiapine (SEROQUEL) 25 MG tablet Take  1 tablet (25 mg total) by mouth at bedtime. 30 tablet 11   ??? [DISCONTINUED] risperiDONE (RISPERDAL) 1 MG tablet Take 1 mg in the morning and 3 mg at bedtime.     ??? [DISCONTINUED] traZODone (DESYREL) 100 MG tablet Take 1 tablet (100 mg total) by mouth at bedtime. 90 tablet 3     No facility-administered encounter medications on file as of 08/25/2017.           Objective:       Blood pressure 110/78, pulse 84, height 5' 5 (1.651 m), weight 156 lb (70.8 kg), last menstrual period 05/20/2011.    Neurologic Exam    Physical Exam   UHDRS 1: Motor Examination  Huntington's Disease Rating Scale  Ocular pursuit, horizontal: 1  Ocular pursuit, vertical: 2  Saccade initiation, horizontal: 3  Saccade initiation, vertical: 1  Saccade velocity, hortizontal : 1  Saccade velocity, vertical: 1  Dysarthria : 2  Tongue Protrusion: 2  Fingers taps, right: 3  Fingers taps, left : 3  Pronate/supinate hands, right : 2  Pronate/supinate hands, left : 2  Luria (fist-hand-palm test): 3  Rigidity arms, right : 2  Rigidity arms, left: 2  Body Bradykinesia: 2  Maximal dystonia, trunk: 2  Maximal dystonia, RUE: 0  Maximal dystonia, LUE: 0  Maximal dystonia, RLE: 1  Maximal dystonia, LLE: 1  Maximal chorea, face: 0  Maixmal chorea, BOL: 0  Maximal chorea, trunk: 0   Maximal chorea, RUE: 0  Maximal chorea, LUE: 0  Maximal chorea, RLE: 0  Maximal chorea, LLE: 0  Gait: 2  Tandem Walking: 4  Retropulsion Pull Test: 2  UHDRS 1 TOTAL:: 44            Assessment:     1. Huntington's disease (CMS Dx)      She has family history of HD, and her history and examination are consistent with a clinical diagnosis of Huntington's disease, progressively worse over time. 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 Psychiatric and behavior symptoms have been very difficult to control but are finally manageable on her current medication regimen. She is now in a nursing home. Her balance is worse over time but she  hasn't had any falls. 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 Sleep is not an issue per her husband. Weight is increased. Swallowing problems are still an issue and she is on a pureed diet for this (she is fixated on choking).     Plan:     1. Continue current medication regimen as above.   2. Continue fall precautions, pureed diet.  3. Follow-up with me in 6 months however told them to contact me sooner if problems arise before then.

## 2017-08-26 NOTE — Unmapped (Signed)
From: [name]  Sent: 08/26/2017 8:43 AM EST  To: [facility]  Subject: [id number] up from your recent visit    Please let me know your overall assessment of Neave???s current condition? I know it had been over a year since you had seen her. Your input is important. Thanks   ----- Message -----  From: Patrecia Pour, MD  Sent: 08/25/2017 4:06 PM EST  To: Celene Squibb. [place]  Subject: Follow up from your recent visit  Dear Glade Lloyd,    Thank you for choosing [facility]. If you have any questions regarding your recent office visit please reply to this message with your questions and I or someone from my staff will respond to you within 2 business days.    Thank you,  Patrecia Pour, MD

## 2017-11-09 MED ORDER — darifenacin (ENABLEX) 7.5 MG 24 hr tablet
7.5 | ORAL_TABLET | Freq: Every day | ORAL | 11 refills | 30.00000 days | Status: AC
Start: 2017-11-09 — End: ?

## 2018-01-13 NOTE — Telephone Encounter (Addendum)
Please advise, we don't have of PT's information here to do a PA, such as previous meds, dx etc b/c he seems to be a nursing home PT?  Should they do this?

## 2018-01-13 NOTE — Telephone Encounter (Signed)
Pt's spouse is requesting a PA on Geodon injection. He stated the previous PA expired and needs the new one backed dated to 12/14/17. [facility] [id number]. Mr. Ogaz stated  that a fax for the PA will be sent  over or they will accept a verbal.

## 2018-01-13 NOTE — Telephone Encounter (Signed)
Leave paperwork in my inbox

## 2018-01-16 NOTE — Telephone Encounter (Signed)
LMCB X 1  Please tell family they need to contact the facility that the patient is staying at and have them do PA.

## 2018-01-16 NOTE — Telephone Encounter (Signed)
There's no form.

## 2018-02-03 ENCOUNTER — Emergency Department
Admission: EM | Admit: 2018-02-03 | Discharge: 2018-02-03 | Disposition: A | Discharge status: Home / Self Care | Payer: Managed Care, Other (non HMO) | Attending: Emergency Medicine | Admitting: Emergency Medicine

## 2018-02-03 ENCOUNTER — Emergency Department: Payer: Managed Care, Other (non HMO)

## 2018-02-03 DIAGNOSIS — Z86711 Personal history of pulmonary embolism: Secondary | ICD-10-CM | POA: Insufficient documentation

## 2018-02-03 DIAGNOSIS — I10 Essential (primary) hypertension: Secondary | ICD-10-CM | POA: Diagnosis not present

## 2018-02-03 DIAGNOSIS — R109 Unspecified abdominal pain: Secondary | ICD-10-CM | POA: Diagnosis present

## 2018-02-03 DIAGNOSIS — K529 Noninfective gastroenteritis and colitis, unspecified: Secondary | ICD-10-CM | POA: Diagnosis not present

## 2018-02-03 HISTORY — DX: Essential (primary) hypertension: I10

## 2018-02-03 LAB — CBC
HCT: 41.7 % (ref 35.0–47.0)
Hemoglobin: 14.2 g/dL (ref 12.0–16.0)
MCH: 27.5 pg (ref 26.0–34.0)
MCHC: 34.2 g/dL (ref 32.0–36.0)
MCV: 80.6 fL (ref 80.0–100.0)
PLATELETS: 239 10*3/uL (ref 150–440)
RBC: 5.17 MIL/uL (ref 3.80–5.20)
RDW: 14.4 % (ref 11.5–14.5)
WBC: 8.3 10*3/uL (ref 3.6–11.0)

## 2018-02-03 LAB — URINALYSIS, COMPLETE (UACMP) WITH MICROSCOPIC
Bacteria, UA: NONE SEEN
Bilirubin Urine: NEGATIVE
Glucose, UA: NEGATIVE mg/dL
Hgb urine dipstick: NEGATIVE
Ketones, ur: NEGATIVE mg/dL
Leukocytes, UA: NEGATIVE
NITRITE: NEGATIVE
PH: 8 (ref 5.0–8.0)
Protein, ur: NEGATIVE mg/dL
Specific Gravity, Urine: 1.018 (ref 1.005–1.030)

## 2018-02-03 LAB — COMPREHENSIVE METABOLIC PANEL
ALT: 14 U/L (ref 0–44)
AST: 21 U/L (ref 15–41)
Albumin: 4.1 g/dL (ref 3.5–5.0)
Alkaline Phosphatase: 69 U/L (ref 38–126)
Anion gap: 8 (ref 5–15)
BUN: 14 mg/dL (ref 6–20)
CALCIUM: 9.6 mg/dL (ref 8.9–10.3)
CHLORIDE: 97 mmol/L — AB (ref 98–111)
CO2: 31 mmol/L (ref 22–32)
Creatinine, Ser: 0.9 mg/dL (ref 0.44–1.00)
Glucose, Bld: 128 mg/dL — ABNORMAL HIGH (ref 70–99)
Potassium: 3.9 mmol/L (ref 3.5–5.1)
Sodium: 136 mmol/L (ref 135–145)
TOTAL PROTEIN: 7.5 g/dL (ref 6.5–8.1)
Total Bilirubin: 0.5 mg/dL (ref 0.3–1.2)

## 2018-02-03 LAB — LIPASE, BLOOD: LIPASE: 29 U/L (ref 11–51)

## 2018-02-03 MED ORDER — IOHEXOL 300 MG/ML  SOLN
100.0000 mL | Freq: Once | INTRAMUSCULAR | Status: AC | PRN
  Administered 2018-02-03: 100 mL via INTRAVENOUS

## 2018-02-03 MED ORDER — HALOPERIDOL LACTATE 5 MG/ML IJ SOLN
2.5000 mg | Freq: Once | INTRAMUSCULAR | Status: AC
  Administered 2018-02-03: 2.5 mg via INTRAVENOUS
  Filled 2018-02-03: qty 1

## 2018-02-03 MED ORDER — DICYCLOMINE HCL 10 MG/ML IM SOLN
20.0000 mg | Freq: Once | INTRAMUSCULAR | Status: AC
  Administered 2018-02-03: 20 mg via INTRAMUSCULAR
  Filled 2018-02-03: qty 2

## 2018-02-03 MED ORDER — DICYCLOMINE HCL 20 MG PO TABS: 20.0000 mg | ORAL_TABLET | Freq: Three times a day (TID) | ORAL | 0 refills | Status: DC | PRN

## 2018-02-03 MED ORDER — ONDANSETRON 4 MG PO TBDP
4.0000 mg | ORAL_TABLET | Freq: Once | ORAL | Status: AC | PRN
  Administered 2018-02-03: 4 mg via ORAL
  Filled 2018-02-03: qty 1

## 2018-02-03 MED ORDER — OXYCODONE-ACETAMINOPHEN 5-325 MG PO TABS: 1.0000 | ORAL_TABLET | ORAL | 0 refills | Status: DC | PRN

## 2018-02-03 MED ORDER — FENTANYL CITRATE (PF) 100 MCG/2ML IJ SOLN
100.0000 ug | Freq: Once | INTRAMUSCULAR | Status: AC
  Administered 2018-02-03: 100 ug via INTRAVENOUS
  Filled 2018-02-03: qty 2

## 2018-02-03 NOTE — Discharge Instructions (Addendum)
It was a pleasure to take care of you today, and thank you for coming to our emergency department.  If you have any questions or concerns before leaving please ask the nurse to grab me and I'm more than happy to go through your aftercare instructions again.  If you were prescribed any opioid pain medication today such as Norco, Vicodin, Percocet, morphine, hydrocodone, or oxycodone please make sure you do not drive when you are taking this medication as it can alter your ability to drive safely.  If you have any concerns once you are home that you are not improving or are in fact getting worse before you can make it to your follow-up appointment, please do not hesitate to call 911 and come back for further evaluation.  Merrily Brittle, MD  Results for orders placed or performed during the hospital encounter of 02/03/18  Lipase, blood  Result Value Ref Range   Lipase 29 11 - 51 U/L  Comprehensive metabolic panel  Result Value Ref Range   Sodium 136 135 - 145 mmol/L   Potassium 3.9 3.5 - 5.1 mmol/L   Chloride 97 (L) 98 - 111 mmol/L   CO2 31 22 - 32 mmol/L   Glucose, Bld 128 (H) 70 - 99 mg/dL   BUN 14 6 - 20 mg/dL   Creatinine, Ser 4.09 0.44 - 1.00 mg/dL   Calcium 9.6 8.9 - 81.1 mg/dL   Total Protein 7.5 6.5 - 8.1 g/dL   Albumin 4.1 3.5 - 5.0 g/dL   AST 21 15 - 41 U/L   ALT 14 0 - 44 U/L   Alkaline Phosphatase 69 38 - 126 U/L   Total Bilirubin 0.5 0.3 - 1.2 mg/dL   GFR calc non Af Amer >60 >60 mL/min   GFR calc Af Amer >60 >60 mL/min   Anion gap 8 5 - 15  CBC  Result Value Ref Range   WBC 8.3 3.6 - 11.0 K/uL   RBC 5.17 3.80 - 5.20 MIL/uL   Hemoglobin 14.2 12.0 - 16.0 g/dL   HCT 91.4 78.2 - 95.6 %   MCV 80.6 80.0 - 100.0 fL   MCH 27.5 26.0 - 34.0 pg   MCHC 34.2 32.0 - 36.0 g/dL   RDW 21.3 08.6 - 57.8 %   Platelets 239 150 - 440 K/uL  Urinalysis, Complete w Microscopic  Result Value Ref Range   Color, Urine YELLOW (A) YELLOW   APPearance CLEAR (A) CLEAR   Specific Gravity,  Urine 1.018 1.005 - 1.030   pH 8.0 5.0 - 8.0   Glucose, UA NEGATIVE NEGATIVE mg/dL   Hgb urine dipstick NEGATIVE NEGATIVE   Bilirubin Urine NEGATIVE NEGATIVE   Ketones, ur NEGATIVE NEGATIVE mg/dL   Protein, ur NEGATIVE NEGATIVE mg/dL   Nitrite NEGATIVE NEGATIVE   Leukocytes, UA NEGATIVE NEGATIVE   RBC / HPF 0-5 0 - 5 RBC/hpf   WBC, UA 0-5 0 - 5 WBC/hpf   Bacteria, UA NONE SEEN NONE SEEN   Squamous Epithelial / LPF 0-5 0 - 5   Mucus PRESENT    Ct Abdomen Pelvis W Contrast  Result Date: 02/03/2018 CLINICAL DATA:  Mid and upper abdominal pain and vomiting. Abdominal infection. EXAM: CT ABDOMEN AND PELVIS WITH CONTRAST TECHNIQUE: Multidetector CT imaging of the abdomen and pelvis was performed using the standard protocol following bolus administration of intravenous contrast. CONTRAST:  OMNIPAQUE IOHEXOL 300 MG/ML  SOLN COMPARISON:  None. FINDINGS: Lower chest: Lower lobe atelectasis. Hepatobiliary: No focal liver abnormality  is seen. No gallstones, gallbladder wall thickening, or biliary dilatation. Pancreas: Unremarkable. No pancreatic ductal dilatation or surrounding inflammatory changes. Spleen: Normal in size without focal abnormality. Adrenals/Urinary Tract: Normal adrenal glands. No hydronephrosis. Minimal left perinephric edema. Symmetric renal excretion on delayed phase imaging. Urinary bladder is partially distended. No bladder wall thickening. Stomach/Bowel: Small hiatal hernia. Stomach physiologically distended. Proximal small bowel nondistended. Moderate length segment of dilated fluid-filled small bowel in the central lower abdomen and pelvis with mesenteric edema and mesenteric free fluid. Scattered areas of mild bowel wall thickening. No discrete transition point, generalized transition to nondilated distal small bowel. Laxity of anterior abdominal wall musculature with slight broad-based ventral abdominal wall hernia, containing small bowel without focal obstruction. Fluid/liquid  stool in the ascending colon. Small volume of stool in the more distal colon. Minimal colonic diverticulosis without diverticulitis. Appendix tentatively identified and normal. Vascular/Lymphatic: No acute vascular finding. Mesenteric vessels are patent. No adenopathy. Reproductive: Status post hysterectomy. No adnexal masses. Other: Mesenteric edema with small amount of mesenteric fluid and free fluid tracking into the pelvis and in the right upper quadrant. No free air. No intra-abdominal abscess. Musculoskeletal: There are no acute or suspicious osseous abnormalities. IMPRESSION: 1. Dilated fluid-filled bowel with mesenteric edema and small amount of free fluid. Findings likely secondary to enteritis rather than early bowel obstruction given lack of transition point. Recommend continued clinical follow-up. 2. Mild colonic diverticulosis without diverticulitis. 3. Minimal left perinephric edema is likely reactive. Electronically Signed   By: Rubye OaksMelanie  Ehinger M.D.   On: 02/03/2018 05:15

## 2018-02-03 NOTE — ED Notes (Signed)
Patient transported to CT 

## 2018-02-03 NOTE — ED Triage Notes (Signed)
Patient c/o mid/upper abdominal pain and 1 emesis, beginning at 1800 yesterday. Patient denies diarrhea. Patient reports 1 normal BM yesterday.

## 2018-02-03 NOTE — ED Provider Notes (Signed)
[facility] Emergency Department Provider Note  ____________________________________________   First MD Initiated Contact with Patient 02/03/18 0216     (approximate)  I have reviewed the triage vital signs and the nursing notes.   HISTORY  Chief Complaint Abdominal Pain   HPI Chelsea Werner is a 62 y.o. female who comes to the emergency department with upper abdominal pain nausea and vomiting x1 that began around 6 or 7 hours ago.  Symptoms came on suddenly and were moderate to severe.  Pain is sharp and aching.  Does not radiate.  No diarrhea.  Nothing seems to make it better or worse.  No fevers or chills.  Denies chest pain or shortness of breath.  No rash.    Past Medical History:  Diagnosis Date  . Allergic rhinitis   . Chronic low back pain   . History of blood transfusion   . History of chicken pox   . Hx of migraines   . Hyperlipidemia   . Hypertension   . Iritis, chronic   . Positive TB test   . Thyroid disease     Patient Active Problem List   Diagnosis Date Noted  . 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 Calculus of parotid gland duct 12/11/2015  . 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 Fibrocystic breast disease 01/03/2015  . Cervical radiculitis 01/03/2015  . Extreme obesity 01/03/2015  . Migraine with aura and without status migrainosus, not intractable 01/03/2015  . LA (lupus anticoagulant) disorder (HCC) 01/03/2015  . 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 Benign hypertension 01/03/2015  . Vitamin D deficiency 01/03/2015  . History of TB (tuberculosis) 01/03/2015  . Hives 01/03/2015  . History of pulmonary embolus (PE) 12/16/2014  . Thrombocytopenia (HCC) 07/01/2014  . Chronic right shoulder pain 04/04/2014  . Edema   . Sacral back pain 04/10/2012  . Chronic low back pain   . Iritis, chronic   . Allergic rhinitis   . Personal history of tuberculosis 10/26/2007    Past Surgical History:  Procedure  Laterality Date  . ABDOMINAL HYSTERECTOMY  1999-2000  . BACK SURGERY  1984  . BREAST BIOPSY  1988, 2008  . CESAREAN SECTION    . MYOMECTOMY  1988  . PLANTAR FASCIECTOMY  2013    Prior to Admission medications   Medication Sig Start Date End Date Taking? Authorizing Provider  amoxicillin-clavulanate (AUGMENTIN) 875-125 MG tablet Take 1 tablet by mouth 2 (two) times daily. Patient not taking: Reported on 02/18/2017 12/11/15   Alba Cory, MD  dicyclomine (BENTYL) 20 MG tablet Take 1 tablet (20 mg total) by mouth 3 (three) times daily as needed for spasms. 02/03/18 02/03/19  Merrily Brittle, MD  fluticasone (FLONASE) 50 MCG/ACT nasal spray Place 2 sprays into both nostrils daily. Patient not taking: Reported on 02/18/2017 09/05/15   Alba Cory, MD  furosemide (LASIX) 20 MG tablet Take 2 tablets (40 mg total) by mouth daily as needed for fluid or edema. 09/05/15   Alba Cory, MD  hydrOXYzine (ATARAX/VISTARIL) 25 MG tablet Take 1 tablet (25 mg total) by mouth 3 (three) times daily as needed. 04/01/16   Alba Cory, MD  Hypromellose 0.3 % SOLN Apply 1 drop to eye 2 (two) times daily. 06/16/15   Alba Cory, MD  levocetirizine (XYZAL) 5 MG tablet Take 5 mg by mouth every evening.    [provider]  lidocaine (LIDODERM) 5 % Place 2 patches onto the skin daily. 09/05/15   Alba Cory, MD  loratadine (  CLARITIN) 10 MG tablet Take 1 tablet (10 mg total) by mouth daily. 06/20/15   Alba Cory, MD  losartan-hydrochlorothiazide (HYZAAR) 50-12.5 MG tablet Take 1 tablet by mouth daily. 09/05/15   Alba Cory, MD  oxyCODONE-acetaminophen (PERCOCET/ROXICET) 5-325 MG tablet Take 1 tablet by mouth every 4 (four) hours as needed for severe pain. 02/03/18   Merrily Brittle, MD  Polyethyl Glycol-Propyl Glycol (SYSTANE) 0.4-0.3 % GEL ophthalmic gel Place 1 application into both eyes 2 (two) times daily. 06/20/15   Alba Cory, MD  potassium chloride (KLOR-CON) 20 MEQ packet Take 20 mEq  by mouth daily. 09/05/15   Alba Cory, MD  prednisoLONE acetate (PRED FORTE) 1 % ophthalmic suspension Place 1 drop into the left eye 4 (four) times daily.    [provider]  prednisoLONE acetate (PRED MILD) 0.12 % ophthalmic suspension Place into both eyes daily as needed.    [provider]    Allergies Xolair [omalizumab]; Metaxalone; Ace inhibitors; Nsaids; Sulfa antibiotics; Clindamycin/lincomycin; and Tramadol  Family History  Problem Relation Age of Onset  . Hypertension Father   . Early death Mother   . Thyroid cancer Sister   . Deep vein thrombosis Sister   . Parkinson's disease Brother   . Prostate cancer Brother     Social History Social History   Tobacco Use  . Smoking status: Never Smoker  . Smokeless tobacco: Never Used  Substance Use Topics  . Alcohol use: Yes    Alcohol/week: 0.0 oz    Comment: occasional  . Drug use: No    Review of Systems Constitutional: No fever/chills Eyes: No visual changes. ENT: No sore throat. Cardiovascular: Denies chest pain. Respiratory: Denies shortness of breath. Gastrointestinal: Positive for abdominal pain.  Positive for nausea, positive for vomiting.  No diarrhea.  No constipation. Genitourinary: Negative for dysuria. Musculoskeletal: Negative for back pain. Skin: Negative for rash. Neurological: Negative for headaches, focal weakness or numbness.   ____________________________________________   PHYSICAL EXAM:  VITAL SIGNS: ED Triage Vitals  Enc Vitals Group     BP 02/03/18 0021 (!) 153/84     Pulse Rate 02/03/18 0021 82     Resp 02/03/18 0021 20     Temp 02/03/18 0021 98 F (36.7 C)     Temp Source 02/03/18 0021 Oral     SpO2 02/03/18 0021 100 %     Weight 02/03/18 0022 259 lb (117.5 kg)     Height 02/03/18 0022 5' 4.5" (1.638 m)     Head Circumference --      Peak Flow --      Pain Score 02/03/18 0021 10     Pain Loc --      Pain Edu? --      Excl. in GC? --      Constitutional: Alert and oriented x4 appears obviously uncomfortable holding her abdomen and tearful Eyes: PERRL EOMI. Head: Atraumatic. Nose: No congestion/rhinnorhea. Mouth/Throat: No trismus Neck: No stridor.   Cardiovascular: Normal rate, regular rhythm. Grossly normal heart sounds.  Good peripheral circulation. Respiratory: Normal respiratory effort.  No retractions. Lungs CTAB and moving good air Gastrointestinal: Obese abdomen soft diffuse tenderness with no rebound or guarding no peritonitis no particular focality Musculoskeletal: No lower extremity edema   Neurologic:  Normal speech and language. No gross focal neurologic deficits are appreciated. Skin:  Skin is warm, dry and intact. No rash noted. Psychiatric: Mood and affect are normal. Speech and behavior are normal.    ____________________________________________   DIFFERENTIAL includes but  not limited to  Appendicitis, diverticulitis, nephrolithiasis, pyelonephritis ____________________________________________   LABS (all labs ordered are listed, but only abnormal results are displayed)  Labs Reviewed  COMPREHENSIVE METABOLIC PANEL - Abnormal; Notable for the following components:      Result Value   Chloride 97 (*)    Glucose, Bld 128 (*)    All other components within normal limits  URINALYSIS, COMPLETE (UACMP) WITH MICROSCOPIC - Abnormal; Notable for the following components:   Color, Urine YELLOW (*)    APPearance CLEAR (*)    All other components within normal limits  LIPASE, BLOOD  CBC    Lab work reviewed by me with no acute disease noted __________________________________________  EKG  ED ECG REPORT I, Merrily BrittleNeil Auston Halfmann, the attending physician, personally viewed and interpreted this ECG.  Date: 02/05/2018 EKG Time:  Rate: 84 Rhythm: normal sinus rhythm QRS Axis: normal Intervals: normal ST/T Wave abnormalities: normal Narrative Interpretation: no evidence of acute  ischemia  ____________________________________________  RADIOLOGY  CT abdomen pelvis reviewed by me consistent with enteritis ____________________________________________   PROCEDURES  Procedure(s) performed: no  Procedures  Critical Care performed: no  ____________________________________________   INITIAL IMPRESSION / ASSESSMENT AND PLAN / ED COURSE  Pertinent labs & imaging results that were available during my care of the patient were reviewed by me and considered in my medical decision making (see chart for details).   As part of my medical decision making, I reviewed the following data within the electronic MEDICAL RECORD NUMBER History obtained from family if available, nursing notes, old chart and ekg, as well as notes from prior ED visits.  The patient comes to the emergency department appearing obviously uncomfortable with diffuse abdominal pain nausea and vomiting.  Given IV fentanyl and haloperidol with some improvement in her symptoms.  Given the severity of her pain she was sent for CT scan with IV contrast which is consistent with enteritis.  She is not clinically obstructed.  His pain is improved following dicyclomine and opiates.  I will discharge her home with a short course of both and refer her back to primary care.  Strict return precautions have been given.      ____________________________________________   FINAL CLINICAL IMPRESSION(S) / ED DIAGNOSES  Final diagnoses:  Enteritis      NEW MEDICATIONS STARTED DURING THIS VISIT:  Discharge Medication List as of 02/03/2018  6:34 AM    START taking these medications   Details  dicyclomine (BENTYL) 20 MG tablet Take 1 tablet (20 mg total) by mouth 3 (three) times daily as needed for spasms., Starting Fri 02/03/2018, Until Sat 02/03/2019, Print    oxyCODONE-acetaminophen (PERCOCET/ROXICET) 5-325 MG tablet Take 1 tablet by mouth every 4 (four) hours as needed for severe pain., Starting Fri 02/03/2018, Print          Note:  This document was prepared using Dragon voice recognition software and may include unintentional dictation errors.     Merrily Brittleifenbark, Amaryllis Malmquist, MD 02/05/18 2210

## 2018-02-03 NOTE — ED Notes (Signed)
Pt reports severe abdominal pain that started around 6:00pm today. She also has Nausea and vomiting. fAmily at bedside

## 2018-02-07 NOTE — Unmapped (Signed)
Patient's husband cancelled appt with Dr Gerilyn Pilgrim on Aug 29th because patient has progressively gotten worse, is wheel chair bound and hard to get her out. He sates if Dr Gerilyn Pilgrim wants to talk to him about her condition he would be more than happy too but he can't take her out of the facility she lives in any longer.

## 2018-02-08 NOTE — Telephone Encounter (Signed)
Please tell patient's husband Kathlene November I totally understand. If issues come up he can call me and we can troubleshoot things over the phone.

## 2018-02-08 NOTE — Telephone Encounter (Signed)
Called patient's husband and spoke with him.  Gave him Dr Larae Grooms message.  Patient's husband stated to tell Dr Gerilyn Pilgrim thank you.  Patient's husband stated that patient is down to only taking 3 injections of Geodon monthly.  He stated that someone at the facility is working on [place] for [facility].  I explained that if he has a problem with the medication to call the office back and I would take care of it for him.

## 2018-02-15 NOTE — Telephone Encounter (Signed)
The patient's  spouse called to follow up on this. please follow up.

## 2018-02-16 NOTE — Telephone Encounter (Signed)
I called and LM for [facility].  Awaiting call back.  Provided my direct number

## 2018-02-16 NOTE — Telephone Encounter (Signed)
Raynelle Fanning,    Patient's husband called and stated that Demetrios Isaacs at patient's facility did PA and it was denied.  Her # [phone]) [id number]    He stated that [facility] [employer] denied the Geodon.  [employer] # [id number]  ID # [id number]    Patient's husband stated that Geodon is the only medication that helps his wife.    He stated the mediation needs appeal.

## 2018-02-21 NOTE — Telephone Encounter (Signed)
Brenda [name] / Pt's husband   States the appeal for the pt's medication Geodon injection was denied.  Brenda [name] request a call back from the office requesting advise on what to do.  Please follow up with Brenda [name] at [phone].

## 2018-02-21 NOTE — Telephone Encounter (Signed)
Called and spoke to patient's husband.  Explained that we need to know if the facility did the appeal or Dr Reddy's office.  Dr Reddy's is prescribing the medication.  Dr Betti Cruz should do the appeal.

## 2018-02-21 NOTE — Telephone Encounter (Signed)
Again called and LM with Marchelle Folks to see if we needed to do anything.  My direct number provided.

## 2018-02-28 NOTE — Telephone Encounter (Signed)
Another attempt made to contact Brenda Folks B.  I left message asking for a return call if she needed our assistance in neurology for gettting the [facility] approved.  I provided my direct number.

## 2018-03-02 ENCOUNTER — Encounter: Payer: PRIVATE HEALTH INSURANCE | Attending: Neurology

## 2018-03-02 ENCOUNTER — Encounter
Admission: RE | Admit: 2018-03-02 | Discharge: 2018-03-02 | Disposition: A | Discharge status: Home / Self Care | Payer: Managed Care, Other (non HMO) | Source: Ambulatory Visit | Attending: Internal Medicine | Admitting: Internal Medicine

## 2018-03-05 ENCOUNTER — Encounter
Admission: RE | Admit: 2018-03-05 | Discharge: 2018-03-05 | Disposition: A | Discharge status: Home / Self Care | Payer: Managed Care, Other (non HMO) | Source: Ambulatory Visit | Attending: Internal Medicine | Admitting: Internal Medicine

## 2018-03-07 ENCOUNTER — Other Ambulatory Visit: Payer: Self-pay

## 2018-03-09 ENCOUNTER — Other Ambulatory Visit
Admission: RE | Admit: 2018-03-09 | Discharge: 2018-03-09 | Disposition: A | Discharge status: Home / Self Care | Payer: Managed Care, Other (non HMO) | Source: Ambulatory Visit | Attending: Internal Medicine | Admitting: Internal Medicine

## 2018-03-09 DIAGNOSIS — R300 Dysuria: Secondary | ICD-10-CM | POA: Insufficient documentation

## 2018-03-09 DIAGNOSIS — R339 Retention of urine, unspecified: Secondary | ICD-10-CM | POA: Diagnosis present

## 2018-03-09 LAB — URINALYSIS, ROUTINE W REFLEX MICROSCOPIC
BACTERIA UA: NONE SEEN
Bilirubin Urine: NEGATIVE
GLUCOSE, UA: NEGATIVE mg/dL
KETONES UR: NEGATIVE mg/dL
Nitrite: POSITIVE — AB
Protein, ur: 30 mg/dL — AB
Specific Gravity, Urine: 1.01 (ref 1.005–1.030)
WBC, UA: >50 WBC/hpf — ABNORMAL HIGH (ref 0–5)
pH: 6 (ref 5.0–8.0)

## 2018-03-11 ENCOUNTER — Emergency Department: Payer: Managed Care, Other (non HMO)

## 2018-03-11 ENCOUNTER — Other Ambulatory Visit: Payer: Self-pay

## 2018-03-11 ENCOUNTER — Observation Stay
Admission: EM | Admit: 2018-03-11 | Discharge: 2018-03-12 | Disposition: A | Discharge status: Skilled Nursing Facility | Payer: Managed Care, Other (non HMO) | Attending: Internal Medicine | Admitting: Internal Medicine

## 2018-03-11 ENCOUNTER — Encounter: Payer: Self-pay | Admitting: *Deleted

## 2018-03-11 DIAGNOSIS — Z7901 Long term (current) use of anticoagulants: Secondary | ICD-10-CM | POA: Insufficient documentation

## 2018-03-11 DIAGNOSIS — R0600 Dyspnea, unspecified: Secondary | ICD-10-CM | POA: Diagnosis present

## 2018-03-11 DIAGNOSIS — Z881 Allergy status to other antibiotic agents status: Secondary | ICD-10-CM | POA: Insufficient documentation

## 2018-03-11 DIAGNOSIS — H2010 Chronic iridocyclitis, unspecified eye: Secondary | ICD-10-CM | POA: Insufficient documentation

## 2018-03-11 DIAGNOSIS — K589 Irritable bowel syndrome without diarrhea: Secondary | ICD-10-CM | POA: Diagnosis not present

## 2018-03-11 DIAGNOSIS — Z888 Allergy status to other drugs, medicaments and biological substances status: Secondary | ICD-10-CM | POA: Insufficient documentation

## 2018-03-11 DIAGNOSIS — M96840 Postprocedural hematoma of a musculoskeletal structure following a musculoskeletal system procedure: Secondary | ICD-10-CM | POA: Diagnosis not present

## 2018-03-11 DIAGNOSIS — G629 Polyneuropathy, unspecified: Secondary | ICD-10-CM | POA: Diagnosis not present

## 2018-03-11 DIAGNOSIS — Z6841 Body Mass Index (BMI) 40.0 and over, adult: Secondary | ICD-10-CM | POA: Insufficient documentation

## 2018-03-11 DIAGNOSIS — M5440 Lumbago with sciatica, unspecified side: Secondary | ICD-10-CM | POA: Diagnosis present

## 2018-03-11 DIAGNOSIS — M549 Dorsalgia, unspecified: Secondary | ICD-10-CM | POA: Diagnosis present

## 2018-03-11 DIAGNOSIS — I10 Essential (primary) hypertension: Secondary | ICD-10-CM | POA: Insufficient documentation

## 2018-03-11 DIAGNOSIS — Z79891 Long term (current) use of opiate analgesic: Secondary | ICD-10-CM | POA: Diagnosis not present

## 2018-03-11 DIAGNOSIS — Z79899 Other long term (current) drug therapy: Secondary | ICD-10-CM | POA: Diagnosis not present

## 2018-03-11 DIAGNOSIS — Z7952 Long term (current) use of systemic steroids: Secondary | ICD-10-CM | POA: Diagnosis not present

## 2018-03-11 DIAGNOSIS — Z86711 Personal history of pulmonary embolism: Secondary | ICD-10-CM | POA: Insufficient documentation

## 2018-03-11 DIAGNOSIS — K219 Gastro-esophageal reflux disease without esophagitis: Secondary | ICD-10-CM | POA: Insufficient documentation

## 2018-03-11 DIAGNOSIS — Y838 Other surgical procedures as the cause of abnormal reaction of the patient, or of later complication, without mention of misadventure at the time of the procedure: Secondary | ICD-10-CM | POA: Diagnosis not present

## 2018-03-11 DIAGNOSIS — D6862 Lupus anticoagulant syndrome: Secondary | ICD-10-CM | POA: Insufficient documentation

## 2018-03-11 DIAGNOSIS — E669 Obesity, unspecified: Secondary | ICD-10-CM | POA: Diagnosis not present

## 2018-03-11 DIAGNOSIS — G8929 Other chronic pain: Secondary | ICD-10-CM | POA: Insufficient documentation

## 2018-03-11 DIAGNOSIS — Z886 Allergy status to analgesic agent status: Secondary | ICD-10-CM | POA: Insufficient documentation

## 2018-03-11 DIAGNOSIS — Z882 Allergy status to sulfonamides status: Secondary | ICD-10-CM | POA: Insufficient documentation

## 2018-03-11 DIAGNOSIS — Z9889 Other specified postprocedural states: Secondary | ICD-10-CM | POA: Diagnosis not present

## 2018-03-11 DIAGNOSIS — M545 Low back pain: Principal | ICD-10-CM | POA: Insufficient documentation

## 2018-03-11 DIAGNOSIS — N390 Urinary tract infection, site not specified: Secondary | ICD-10-CM | POA: Insufficient documentation

## 2018-03-11 DIAGNOSIS — Z86718 Personal history of other venous thrombosis and embolism: Secondary | ICD-10-CM | POA: Insufficient documentation

## 2018-03-11 DIAGNOSIS — R7611 Nonspecific reaction to tuberculin skin test without active tuberculosis: Secondary | ICD-10-CM | POA: Insufficient documentation

## 2018-03-11 DIAGNOSIS — Z8249 Family history of ischemic heart disease and other diseases of the circulatory system: Secondary | ICD-10-CM | POA: Insufficient documentation

## 2018-03-11 HISTORY — DX: Other pulmonary embolism without acute cor pulmonale: I26.99

## 2018-03-11 LAB — CBC WITH DIFFERENTIAL/PLATELET
Basophils Absolute: 0 10*3/uL (ref 0–0.1)
Basophils Relative: 0 %
Eosinophils Absolute: 0 10*3/uL (ref 0–0.7)
Eosinophils Relative: 0 %
HCT: 37.4 % (ref 35.0–47.0)
HEMOGLOBIN: 12.4 g/dL (ref 12.0–16.0)
LYMPHS PCT: 9 %
Lymphs Abs: 0.8 10*3/uL — ABNORMAL LOW (ref 1.0–3.6)
MCH: 27.3 pg (ref 26.0–34.0)
MCHC: 33.3 g/dL (ref 32.0–36.0)
MCV: 82 fL (ref 80.0–100.0)
Monocytes Absolute: 0.1 10*3/uL — ABNORMAL LOW (ref 0.2–0.9)
Monocytes Relative: 1 %
NEUTROS PCT: 90 %
Neutro Abs: 8.1 10*3/uL — ABNORMAL HIGH (ref 1.4–6.5)
Platelets: 325 10*3/uL (ref 150–440)
RBC: 4.56 MIL/uL (ref 3.80–5.20)
RDW: 14.5 % (ref 11.5–14.5)
WBC: 9 10*3/uL (ref 3.6–11.0)

## 2018-03-11 LAB — BASIC METABOLIC PANEL
Anion gap: 10 (ref 5–15)
BUN: 12 mg/dL (ref 8–23)
CHLORIDE: 102 mmol/L (ref 98–111)
CO2: 26 mmol/L (ref 22–32)
Calcium: 8.6 mg/dL — ABNORMAL LOW (ref 8.9–10.3)
Creatinine, Ser: 0.91 mg/dL (ref 0.44–1.00)
GFR calc Af Amer: >60 mL/min (ref >60)
GFR calc non Af Amer: >60 mL/min (ref >60)
GLUCOSE: 130 mg/dL — AB (ref 70–99)
Potassium: 5.2 mmol/L — ABNORMAL HIGH (ref 3.5–5.1)
Sodium: 138 mmol/L (ref 135–145)

## 2018-03-11 LAB — TROPONIN I: Troponin I: <0.03 ng/mL (ref <0.03)

## 2018-03-11 MED ORDER — OXYCODONE-ACETAMINOPHEN 5-325 MG PO TABS
1.0000 | ORAL_TABLET | Freq: Four times a day (QID) | ORAL | Status: DC | PRN
  Administered 2018-03-12: 1 via ORAL
  Filled 2018-03-11: qty 1

## 2018-03-11 MED ORDER — ONDANSETRON HCL 4 MG PO TABS: 4.0000 mg | ORAL_TABLET | Freq: Four times a day (QID) | ORAL | Status: DC | PRN

## 2018-03-11 MED ORDER — RISAQUAD PO CAPS
1.0000 | ORAL_CAPSULE | Freq: Every day | ORAL | Status: DC
  Administered 2018-03-12: 1 via ORAL
  Filled 2018-03-11: qty 1

## 2018-03-11 MED ORDER — LIDOCAINE 5 % EX PTCH
1.0000 | MEDICATED_PATCH | Freq: Every day | CUTANEOUS | Status: DC
  Administered 2018-03-12: 1 via TRANSDERMAL
  Filled 2018-03-11: qty 1

## 2018-03-11 MED ORDER — PANTOPRAZOLE SODIUM 40 MG PO TBEC
40.0000 mg | DELAYED_RELEASE_TABLET | Freq: Every day | ORAL | Status: DC
  Administered 2018-03-12: 40 mg via ORAL
  Filled 2018-03-11: qty 1

## 2018-03-11 MED ORDER — ONDANSETRON HCL 4 MG/2ML IJ SOLN: 4.0000 mg | Freq: Four times a day (QID) | INTRAMUSCULAR | Status: DC | PRN

## 2018-03-11 MED ORDER — HYDROMORPHONE HCL 1 MG/ML IJ SOLN
INTRAMUSCULAR | Status: AC
  Filled 2018-03-11: qty 1

## 2018-03-11 MED ORDER — ACETAMINOPHEN 325 MG PO TABS: 650.0000 mg | ORAL_TABLET | Freq: Four times a day (QID) | ORAL | Status: DC | PRN

## 2018-03-11 MED ORDER — TIZANIDINE HCL 4 MG PO TABS
4.0000 mg | ORAL_TABLET | Freq: Three times a day (TID) | ORAL | Status: DC | PRN
  Filled 2018-03-11: qty 1

## 2018-03-11 MED ORDER — AMLODIPINE BESYLATE 5 MG PO TABS
5.0000 mg | ORAL_TABLET | Freq: Every day | ORAL | Status: DC
  Filled 2018-03-11: qty 1

## 2018-03-11 MED ORDER — LORATADINE 10 MG PO TABS
10.0000 mg | ORAL_TABLET | Freq: Every day | ORAL | Status: DC
  Administered 2018-03-12: 10 mg via ORAL
  Filled 2018-03-11: qty 1

## 2018-03-11 MED ORDER — HYDROXYZINE HCL 25 MG PO TABS
25.0000 mg | ORAL_TABLET | Freq: Three times a day (TID) | ORAL | Status: DC | PRN
  Filled 2018-03-11: qty 1

## 2018-03-11 MED ORDER — METHYLPREDNISOLONE SODIUM SUCC 40 MG IJ SOLR
40.0000 mg | Freq: Two times a day (BID) | INTRAMUSCULAR | Status: DC
  Administered 2018-03-12: 40 mg via INTRAVENOUS
  Filled 2018-03-11: qty 1

## 2018-03-11 MED ORDER — GADOBENATE DIMEGLUMINE 529 MG/ML IV SOLN
20.0000 mL | Freq: Once | INTRAVENOUS | Status: AC | PRN
  Administered 2018-03-11: 20 mL via INTRAVENOUS

## 2018-03-11 MED ORDER — HYDROMORPHONE HCL 1 MG/ML IJ SOLN: 1.0000 mg | INTRAMUSCULAR | Status: DC | PRN

## 2018-03-11 MED ORDER — SODIUM CHLORIDE 0.9 % IV SOLN
1.0000 g | INTRAVENOUS | Status: DC
  Administered 2018-03-11: 1 g via INTRAVENOUS
  Filled 2018-03-11: qty 10
  Filled 2018-03-11: qty 1

## 2018-03-11 MED ORDER — ONDANSETRON HCL 4 MG/2ML IJ SOLN
4.0000 mg | Freq: Once | INTRAMUSCULAR | Status: AC
  Administered 2018-03-11: 4 mg via INTRAVENOUS
  Filled 2018-03-11: qty 2

## 2018-03-11 MED ORDER — POLYETHYLENE GLYCOL 3350 17 G PO PACK
17.0000 g | PACK | Freq: Every day | ORAL | Status: DC
  Administered 2018-03-12: 17 g via ORAL
  Filled 2018-03-11: qty 1

## 2018-03-11 MED ORDER — HYDROMORPHONE HCL 1 MG/ML IJ SOLN
0.5000 mg | Freq: Once | INTRAMUSCULAR | Status: AC
  Administered 2018-03-11: 0.5 mg via INTRAVENOUS

## 2018-03-11 MED ORDER — HYDROMORPHONE HCL 1 MG/ML IJ SOLN
1.0000 mg | Freq: Once | INTRAMUSCULAR | Status: AC
  Administered 2018-03-11: 1 mg via INTRAVENOUS
  Filled 2018-03-11: qty 1

## 2018-03-11 MED ORDER — PREDNISOLONE ACETATE 0.12 % OP SUSP
1.0000 [drp] | Freq: Every day | OPHTHALMIC | Status: DC | PRN
  Filled 2018-03-11: qty 5

## 2018-03-11 MED ORDER — ACETAMINOPHEN 650 MG RE SUPP: 650.0000 mg | Freq: Four times a day (QID) | RECTAL | Status: DC | PRN

## 2018-03-11 MED ORDER — SENNA 8.6 MG PO TABS
2.0000 | ORAL_TABLET | Freq: Two times a day (BID) | ORAL | Status: DC
  Administered 2018-03-11 – 2018-03-12 (×2): 17.2 mg via ORAL
  Filled 2018-03-11 (×2): qty 2

## 2018-03-11 MED ORDER — TRIAMCINOLONE ACETONIDE 0.1 % EX CREA
1.0000 "application " | TOPICAL_CREAM | Freq: Two times a day (BID) | CUTANEOUS | Status: DC | PRN
  Filled 2018-03-11: qty 15

## 2018-03-11 MED ORDER — APIXABAN 5 MG PO TABS
5.0000 mg | ORAL_TABLET | Freq: Two times a day (BID) | ORAL | Status: DC
  Administered 2018-03-11 – 2018-03-12 (×2): 5 mg via ORAL
  Filled 2018-03-11 (×2): qty 1

## 2018-03-11 MED ORDER — POTASSIUM CHLORIDE 20 MEQ PO PACK: 20.0000 meq | PACK | Freq: Every day | ORAL | Status: DC | PRN

## 2018-03-11 MED ORDER — POLYVINYL ALCOHOL 1.4 % OP SOLN
1.0000 [drp] | Freq: Every day | OPHTHALMIC | Status: DC
  Administered 2018-03-12: 1 [drp] via OPHTHALMIC
  Filled 2018-03-11: qty 15

## 2018-03-11 MED ORDER — OXYCODONE HCL ER 15 MG PO T12A
15.0000 mg | EXTENDED_RELEASE_TABLET | Freq: Two times a day (BID) | ORAL | Status: DC
  Administered 2018-03-11 – 2018-03-12 (×2): 15 mg via ORAL
  Filled 2018-03-11 (×2): qty 1

## 2018-03-11 MED ORDER — DICYCLOMINE HCL 20 MG PO TABS
20.0000 mg | ORAL_TABLET | Freq: Three times a day (TID) | ORAL | Status: DC
  Filled 2018-03-11 (×4): qty 1

## 2018-03-11 MED ORDER — METHYLPREDNISOLONE SODIUM SUCC 125 MG IJ SOLR
125.0000 mg | Freq: Once | INTRAMUSCULAR | Status: AC
  Administered 2018-03-11: 125 mg via INTRAVENOUS
  Filled 2018-03-11: qty 2

## 2018-03-11 MED ORDER — DOCUSATE SODIUM 100 MG PO CAPS
100.0000 mg | ORAL_CAPSULE | Freq: Two times a day (BID) | ORAL | Status: DC
  Administered 2018-03-11 – 2018-03-12 (×2): 100 mg via ORAL
  Filled 2018-03-11 (×2): qty 1

## 2018-03-11 MED ORDER — IOHEXOL 350 MG/ML SOLN
75.0000 mL | Freq: Once | INTRAVENOUS | Status: AC | PRN
  Administered 2018-03-11: 75 mL via INTRAVENOUS

## 2018-03-11 MED ORDER — FAMOTIDINE 20 MG PO TABS
20.0000 mg | ORAL_TABLET | Freq: Two times a day (BID) | ORAL | Status: DC
  Administered 2018-03-11 – 2018-03-12 (×2): 20 mg via ORAL
  Filled 2018-03-11 (×2): qty 1

## 2018-03-11 MED ORDER — FUROSEMIDE 40 MG PO TABS: 20.0000 mg | ORAL_TABLET | Freq: Every day | ORAL | Status: DC | PRN

## 2018-03-11 MED ORDER — GABAPENTIN 100 MG PO CAPS
100.0000 mg | ORAL_CAPSULE | Freq: Three times a day (TID) | ORAL | Status: DC
  Administered 2018-03-11 – 2018-03-12 (×2): 100 mg via ORAL
  Filled 2018-03-11 (×2): qty 1

## 2018-03-11 NOTE — H&P (Signed)
[employer] [facility] [place] at [facility]   PATIENT NAME: Chelsea Werner    MR#:  [id number]  DATE OF BIRTH:  Feb 02, 1956  DATE OF ADMISSION:  03/11/2018  PRIMARY CARE PHYSICIAN: Karna Dupes, MD   REQUESTING/REFERRING PHYSICIAN: Dr. Daryel November  CHIEF COMPLAINT:   Chief Complaint  Patient presents with  . Back Pain  . Chest Pain  . Shortness of Breath    HISTORY OF PRESENT ILLNESS:  Chelsea Werner  is a 62 y.o. female with a known history of previous history of pulmonary embolism, chronic back pain, hypertension, IBS, hypothyroidism, status post recent laminectomy due to spinal stenosis with L5-S1 nerve root impingement who presents to the hospital from skilled nursing facility due to worsening back pain.  Patient had her back surgery about 3 weeks ago and was admitted to short-term rehab and has been doing rehab and doing well and still has some back pain but over the past 2 days it has gotten significantly worse and today she was having significant tremor and weakness when attempting to ambulate.  She therefore came to the ER for further evaluation.  Patient also complained of some mild shortness of breath and vague chest pain which have also improved now.  Patient has received multiple doses of IV Dilaudid for her pain control due to her back pain and it has still not improved and therefore hospitalist services were contacted for admission.  Patient underwent an MRI of her lumbar spine which showed postsurgical changes and a subacute hematoma without any nerve impingement or cord compression.  The ER physician spoke to the neurosurgeon on-call who did her surgery 3 weeks ago and they did not recommend any further intervention presently.  Patient also complains of some mild dysuria but denies any fevers, chills nausea vomiting or any other associated symptoms presently.  PAST MEDICAL HISTORY:   Past Medical History:  Diagnosis Date  . Allergic rhinitis   . Chronic low back pain    . History of blood transfusion   . History of chicken pox   . Hx of migraines   . Hyperlipidemia   . Hypertension   . Iritis, chronic   . Positive TB test   . Pulmonary embolism (HCC)   . Thyroid disease     PAST SURGICAL HISTORY:   Past Surgical History:  Procedure Laterality Date  . ABDOMINAL HYSTERECTOMY  1999-2000  . BACK SURGERY  1984  . BREAST BIOPSY  1988, 2008  . CESAREAN SECTION    . MYOMECTOMY  1988  . PLANTAR FASCIECTOMY  2013    SOCIAL HISTORY:   Social History   Tobacco Use  . Smoking status: Never Smoker  . Smokeless tobacco: Never Used  Substance Use Topics  . Alcohol use: Yes    Alcohol/week: 0.0 standard drinks    Comment: occasional    FAMILY HISTORY:   Family History  Problem Relation Age of Onset  . Hypertension Father   . Early death Mother   . Thyroid cancer Sister   . Deep vein thrombosis Sister   . Parkinson's disease Brother   . Prostate cancer Brother     DRUG ALLERGIES:   Allergies  Allergen Reactions  . Xolair [Omalizumab] Shortness Of Breath  . Metaxalone Rash  . Ace Inhibitors Cough  . Nsaids Hives  . Sulfa Antibiotics Hives  . Clindamycin/Lincomycin Swelling and Rash  . Tramadol Swelling and Rash    REVIEW OF SYSTEMS:   Review of Systems  Constitutional: Negative for  fever and weight loss.  HENT: Negative for congestion, nosebleeds and tinnitus.   Eyes: Negative for blurred vision, double vision and redness.  Respiratory: Negative for cough, hemoptysis and shortness of breath.   Cardiovascular: Negative for chest pain, orthopnea, leg swelling and PND.  Gastrointestinal: Negative for abdominal pain, diarrhea, melena, nausea and vomiting.  Genitourinary: Positive for dysuria. Negative for hematuria and urgency.  Musculoskeletal: Positive for back pain. Negative for falls and joint pain.  Neurological: Negative for dizziness, tingling, sensory change, focal weakness, seizures, weakness and headaches.   Endo/Heme/Allergies: Negative for polydipsia. Does not bruise/bleed easily.  Psychiatric/Behavioral: Negative for depression and memory loss. The patient is not nervous/anxious.     MEDICATIONS AT HOME:   Prior to Admission medications   Medication Sig Start Date End Date Taking? Authorizing Provider  acetaminophen (TYLENOL) 325 MG tablet Take 650 mg by mouth every 4 (four) hours as needed for mild pain, moderate pain or fever.   Yes [provider]  acidophilus (RISAQUAD) CAPS capsule Take 1 capsule by mouth daily.   Yes [provider]  amLODipine (NORVASC) 5 MG tablet Take 5 mg by mouth daily.   Yes [provider]  apixaban (ELIQUIS) 5 MG TABS tablet Take 5 mg by mouth 2 (two) times daily.   Yes [provider]  carboxymethylcellulose (REFRESH PLUS) 0.5 % SOLN Place 1 drop into both eyes daily.   Yes [provider]  cetirizine (ZYRTEC) 10 MG tablet Take 10 mg by mouth at bedtime.   Yes [provider]  dicyclomine (BENTYL) 20 MG tablet Take 1 tablet (20 mg total) by mouth 3 (three) times daily as needed for spasms. Patient taking differently: Take 20 mg by mouth 3 (three) times daily.  02/03/18 02/03/19 Yes Merrily Brittle, MD  docusate sodium (COLACE) 100 MG capsule Take 100 mg by mouth 2 (two) times daily.   Yes [provider]  furosemide (LASIX) 20 MG tablet Take 2 tablets (40 mg total) by mouth daily as needed for fluid or edema. Patient taking differently: Take 20 mg by mouth daily as needed for fluid or edema.  09/05/15  Yes Sowles, Danna Hefty, MD  gabapentin (NEURONTIN) 100 MG capsule Take 100 mg by mouth 3 (three) times daily.   Yes [provider]  hydrOXYzine (ATARAX/VISTARIL) 25 MG tablet Take 1 tablet (25 mg total) by mouth 3 (three) times daily as needed. 04/01/16  Yes Sowles, Danna Hefty, MD  Lidocaine (ASPERCREME LIDOCAINE) 4 % PTCH Apply 1 patch topically every 12 (twelve) hours.   Yes [provider]   omeprazole (PRILOSEC) 40 MG capsule Take 40 mg by mouth daily.   Yes [provider]  oxyCODONE (OXYCONTIN) 15 mg 12 hr tablet Take 15 mg by mouth every 12 (twelve) hours.   Yes [provider]  oxyCODONE-acetaminophen (PERCOCET/ROXICET) 5-325 MG tablet Take 1 tablet by mouth every 6 (six) hours as needed (breakthrough pain).   Yes [provider]  polyethylene glycol (MIRALAX / GLYCOLAX) packet Take 17 g by mouth daily.   Yes [provider]  potassium chloride (KLOR-CON) 20 MEQ packet Take 20 mEq by mouth daily. Patient taking differently: Take 20 mEq by mouth daily as needed (with Lasix).  09/05/15  Yes Sowles, Danna Hefty, MD  prednisoLONE acetate (PRED MILD) 0.12 % ophthalmic suspension Place 1 drop into both eyes daily as needed (irratation).    Yes [provider]  ranitidine (ZANTAC) 150 MG tablet Take 150 mg by mouth 2 (two) times daily.  Yes [provider]  senna (SENOKOT) 8.6 MG TABS tablet Take 2 tablets by mouth 2 (two) times daily.   Yes [provider]  tiZANidine (ZANAFLEX) 4 MG tablet Take 4 mg by mouth 3 (three) times daily as needed for muscle spasms.   Yes [provider]  triamcinolone cream (KENALOG) 0.1 % Apply 1 application topically 2 (two) times daily as needed (rash).   Yes [provider]  oxyCODONE-acetaminophen (PERCOCET/ROXICET) 5-325 MG tablet Take 1 tablet by mouth every 4 (four) hours as needed for severe pain. Patient not taking: Reported on 03/11/2018 02/03/18   Merrily Brittle, MD      VITAL SIGNS:  Blood pressure 108/63, pulse 95, temperature 98.6 F (37 C), temperature source Oral, resp. rate 16, height 5\' 5"  (1.651 m), weight 117 kg, SpO2 98 %.  PHYSICAL EXAMINATION:  Physical Exam  GENERAL:  62 y.o.-year-old patient lying in bed in mild distress due to back pain.   EYES: Pupils equal, round, reactive to light and accommodation. No scleral icterus. Extraocular muscles intact.   HEENT: Head atraumatic, normocephalic. Oropharynx and nasopharynx clear. No oropharyngeal erythema, moist oral mucosa  NECK:  Supple, no jugular venous distention. No thyroid enlargement, no tenderness.  LUNGS: Normal breath sounds bilaterally, no wheezing, rales, rhonchi. No use of accessory muscles of respiration.  CARDIOVASCULAR: S1, S2 RRR. No murmurs, rubs, gallops, clicks.  ABDOMEN: Soft, nontender, nondistended. Bowel sounds present. No organomegaly or mass.  EXTREMITIES: No pedal edema, cyanosis, or clubbing. + 2 pedal & radial pulses b/l.   NEUROLOGIC: Cranial nerves II through XII are intact.  Globally weak but having worse weakness on the right lower extremity as compared to the left. PSYCHIATRIC: The patient is alert and oriented x 3. Good affect.  SKIN: No obvious rash, lesion, or ulcer.   LABORATORY PANEL:   CBC Recent Labs  Lab 03/11/18 1237  WBC 9.0  HGB 12.4  HCT 37.4  PLT 325   ------------------------------------------------------------------------------------------------------------------  Chemistries  Recent Labs  Lab 03/11/18 1319  NA 138  K 5.2*  CL 102  CO2 26  GLUCOSE 130*  BUN 12  CREATININE 0.91  CALCIUM 8.6*   ------------------------------------------------------------------------------------------------------------------  Cardiac Enzymes Recent Labs  Lab 03/11/18 1319  TROPONINI <0.03   ------------------------------------------------------------------------------------------------------------------  RADIOLOGY:  Ct Angio Chest Pe W And/or Wo Contrast  Result Date: 03/11/2018 CLINICAL DATA:  62 year old female with acute shortness of breath today. History of spine surgery 2 weeks ago. Prior history of DVT/pulmonary emboli. EXAM: CT ANGIOGRAPHY CHEST WITH CONTRAST TECHNIQUE: Multidetector CT imaging of the chest was performed using the standard protocol during bolus administration of intravenous contrast. Multiplanar CT image  reconstructions and MIPs were obtained to evaluate the vascular anatomy. CONTRAST:  75mL OMNIPAQUE IOHEXOL 350 MG/ML SOLN COMPARISON:  03/11/2018 chest radiograph and 02/18/2017 CT FINDINGS: Cardiovascular: This is a technically borderline study with moderate respiratory motion artifact in the LOWER lungs. No definite pulmonary emboli are identified. UPPER limits normal heart size noted. No thoracic aortic aneurysm or pericardial effusion. Mediastinum/Nodes: No enlarged mediastinal, hilar, or axillary lymph nodes. Thyroid gland, trachea, and esophagus demonstrate no significant findings. Lungs/Pleura: Mild bibasilar atelectasis noted. There is no evidence of definite airspace disease or consolidation. No suspicious nodule or mass noted. Upper Abdomen: No acute abnormality. Musculoskeletal: No chest wall abnormality. No acute or significant osseous findings. Review of the MIP images confirms the above findings. IMPRESSION: 1. No evidence of pulmonary emboli but evaluation is limited in the LOWER lungs due to respiratory  motion artifact. 2. Mild bibasilar atelectasis. Electronically Signed   By: Harmon Pier M.D.   On: 03/11/2018 16:16   Mr Lumbar Spine W Wo Contrast  Result Date: 03/11/2018 CLINICAL DATA:  Worsening low back and right leg pain. Lumbar surgery 2 weeks ago. On Eliquis for DVT. EXAM: MRI LUMBAR SPINE WITHOUT AND WITH CONTRAST TECHNIQUE: Multiplanar and multiecho pulse sequences of the lumbar spine were obtained without and with intravenous contrast. CONTRAST:  13mL MULTIHANCE GADOBENATE DIMEGLUMINE 529 MG/ML IV SOLN COMPARISON:  CT abdomen and pelvis 02/03/2018. MRI sacrum 06/08/2012. FINDINGS: Segmentation:  Standard. Alignment:  Normal. Vertebrae: No fracture, suspicious osseous lesion, or significant marrow edema. Conus medullaris and cauda equina: Conus extends to the L1-2 level. Conus and cauda equina appear normal. Paraspinal and other soft tissues: Postoperative changes in the posterior lower  lumbar soft tissues. Dorsal epidural fluid collection in the laminectomy bed extending from L4-S1 measures 3.4 x 3.1 x 7.0 cm and demonstrates intermediate T1 signal intensity with enhancement in the surrounding soft tissues. A separate fluid collection along the incision in the subcutaneous tissues measures 3.4 x 5.2 x 5.9 cm. Disc levels: L1-2 through L3-4: Normal discs. Mild facet arthrosis and ligamentum flavum hypertrophy without stenosis. L4-5: Disc desiccation. Prior posterior decompression. Enhancing epidural granulation tissue bilaterally. At most minimal disc bulging and mild facet arthrosis without stenosis. L5-S1: Prior posterior decompression with above described fluid collection extending into the lateral aspects of the spinal canal bilaterally and contacting the S1 nerve roots bilaterally without frank nerve root compression. Chronic complete effacement of the thecal sac from the L5 vertebral body level inferiorly due to extensive epidural fat. Minimal disc bulging without neural foraminal stenosis. IMPRESSION: 1. Postoperative changes in the lower lumbar spine including a 7 cm dorsal epidural fluid collection from L4-S1 favored to predominantly reflect subacute hematoma. Infection is not excluded by imaging. The fluid collection contacts the S1 nerve roots in the lateral aspects of the spinal canal without definite neural compression. 2. Mild diffuse lumbar facet arthrosis. Electronically Signed   By: Sebastian Ache M.D.   On: 03/11/2018 16:55   US Venous Img Lower Unilateral Right  Result Date: 03/11/2018 CLINICAL DATA:  Pain and swelling EXAM: RIGHT LOWER EXTREMITY VENOUS DOPPLER ULTRASOUND TECHNIQUE: Gray-scale sonography with compression, as well as color and duplex ultrasound, were performed to evaluate the deep venous system from the level of the common femoral vein through the popliteal and proximal calf veins. COMPARISON:  None FINDINGS: Normal compressibility of the common femoral,  superficial femoral, and popliteal veins, as well as the proximal calf veins. No filling defects to suggest DVT on grayscale or color Doppler imaging. Doppler waveforms show normal direction of venous flow, normal respiratory phasicity and response to augmentation. Survey views of the contralateral common femoral vein are unremarkable. IMPRESSION: No evidence of right lower extremity deep vein thrombosis. Electronically Signed   By: Corlis Leak M.D.   On: 03/11/2018 14:07   Dg Chest Port 1 View  Result Date: 03/11/2018 CLINICAL DATA:  Shortness of breath, right leg swelling, and back pain. History of pulmonary embolism. EXAM: PORTABLE CHEST 1 VIEW COMPARISON:  Chest radiographs and CTA 02/18/2017 FINDINGS: The cardiac silhouette is borderline enlarged. No airspace consolidation, edema, sizable pleural effusion, or pneumothorax is identified. No acute osseous abnormality is seen. IMPRESSION: No active disease. Electronically Signed   By: Sebastian Ache M.D.   On: 03/11/2018 13:40     IMPRESSION AND PLAN:   62 year old female with past medical history  of pulmonary embolism, hypothyroidism, IBS, hypertension, hyperlipidemia, chronic back pain, history of lumbar radiculopathy and status post recent laminectomy for spinal stenosis with L5-S1 nerve root impingement who is currently at rehab presents to the hospital due to worsening back pain.  1.  Back pain-patient had worsening back pain since her surgery 3 weeks ago.  She received multiple doses of IV Dilaudid with minimal improvement in her pain.  She is being admitted under observation for pain control.  The patient's MRI of the lumbar spine shows postsurgical changes with a subacute hematoma.  The ER physician discussed with neurosurgeon on-call who did her surgery 3 weeks ago he did not recommend any further intervention but rather just some supportive care with pain control and physical therapy. - I will continue her chronic back pain meds including her  OxyContin and as needed oxycodone along with some IV Dilaudid.  If her pain is not improving with consult neurosurgery on Monday.  2.  Urinary tract infection-based off a urinalysis she had at the skilled nursing facility 2 days ago and she is symptomatic with some dysuria. - We will treat her with IV ceftriaxone, follow urine cultures.  3.  History of pulmonary embolism-continue Eliquis.  Patient had a repeat CT chest with contrast done on admission showing no acute pulmonary embolism.  4.  Neuropathy-continue gabapentin.  5.  History of irritable bowel syndrome-continue Bentyl.  6.  Essential hypertension-continue Norvasc.  7.  GERD-continue Pepcid, Protonix.  All the records are reviewed and case discussed with ED provider. Management plans discussed with the patient, family and they are in agreement.  CODE STATUS: Full code  TOTAL TIME TAKING CARE OF THIS PATIENT: 45 minutes.    Houston Siren M.D on 03/11/2018 at 7:35 PM  Between 7am to 6pm - Pager - [phone]  After 6pm go to [email or web address] - password [email or web address] [facility]  [facility]  Office  [phone]  CC: Primary care physician; Karna Dupes, MD

## 2018-03-11 NOTE — ED Notes (Signed)
Patient transported to CT and then MRI.

## 2018-03-11 NOTE — ED Triage Notes (Signed)
Pt presents to ED via [facility] from [facility]. Per EMS pt is at [facility] for rehab s/p back surgery on 8/20. Pt reports previously pain was controlled with her prescribed oxycodone and muscle relaxers which were given approx 1 hr PTA, however today pt reports no relief with pain medication. Pt also c/o CP and SOB. Pt reports when she was hospitalized previously she was told she had a DVT, pt reports swelling to R leg. Pt noted to be tachypneic and shallow with her respirations.

## 2018-03-11 NOTE — ED Provider Notes (Signed)
Patient is in no acute distress but still having pain in her back.  She has received multiple doses of IV narcotics and does not have any acute neurologic deficits.  I discussed her case with the neurosurgery team at [facility] health.  Findings are felt to be acceptable and postoperative.  IV steroids was recommended.  I have discussed admission for pain control which I do think is reasonable in this situation.   Emily Filbert, MD 03/11/18 [phone]

## 2018-03-11 NOTE — ED Provider Notes (Signed)
[facility] Emergency Department Provider Note ____________________________________________   I have reviewed the triage vital signs and the triage nursing note.  HISTORY  Chief Complaint Back Pain; Chest Pain; and Shortness of Breath   Historian Patient  HPI Chelsea Werner is a 62 y.o. female with a history of DVT and history of PE/saddle embolus, most recent PE was in 2016, she has been on Eliquis since then.  She had spine surgery at [facility] in [place] about 2 weeks ago.  She was off Eliquis for several days before the procedure and 2 days after the procedure.  She developed right leg pain and was diagnosed with new DVT 2 days postop and was restarted on Eliquis at that point time.  States that she was not short of breath and she did not have testing for PE.  She has been at [employer].  She has been having worsening pain in the right leg and today she is also feeling short of breath.  No fevers.     Past Medical History:  Diagnosis Date  . Allergic rhinitis   . Chronic low back pain   . History of blood transfusion   . History of chicken pox   . Hx of migraines   . Hyperlipidemia   . Hypertension   . Iritis, chronic   . Positive TB test   . Pulmonary embolism (HCC)   . Thyroid disease     Patient Active Problem List   Diagnosis Date Noted  . 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 Calculus of parotid gland duct 12/11/2015  . 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 Fibrocystic breast disease 01/03/2015  . Cervical radiculitis 01/03/2015  . Extreme obesity 01/03/2015  . Migraine with aura and without status migrainosus, not intractable 01/03/2015  . LA (lupus anticoagulant) disorder (HCC) 01/03/2015  . 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 Benign hypertension 01/03/2015  . Vitamin D deficiency 01/03/2015  . History of TB (tuberculosis) 01/03/2015  . Hives 01/03/2015  . History of pulmonary embolus (PE) 12/16/2014  .  Thrombocytopenia (HCC) 07/01/2014  . Chronic right shoulder pain 04/04/2014  . Edema   . Sacral back pain 04/10/2012  . Chronic low back pain   . Iritis, chronic   . Allergic rhinitis   . Personal history of tuberculosis 10/26/2007    Past Surgical History:  Procedure Laterality Date  . ABDOMINAL HYSTERECTOMY  1999-2000  . BACK SURGERY  1984  . BREAST BIOPSY  1988, 2008  . CESAREAN SECTION    . MYOMECTOMY  1988  . PLANTAR FASCIECTOMY  2013    Prior to Admission medications   Medication Sig Start Date End Date Taking? Authorizing Provider  amoxicillin-clavulanate (AUGMENTIN) 875-125 MG tablet Take 1 tablet by mouth 2 (two) times daily. Patient not taking: Reported on 02/18/2017 12/11/15   Alba Cory, MD  dicyclomine (BENTYL) 20 MG tablet Take 1 tablet (20 mg total) by mouth 3 (three) times daily as needed for spasms. 02/03/18 02/03/19  Merrily Brittle, MD  fluticasone (FLONASE) 50 MCG/ACT nasal spray Place 2 sprays into both nostrils daily. Patient not taking: Reported on 02/18/2017 09/05/15   Alba Cory, MD  furosemide (LASIX) 20 MG tablet Take 2 tablets (40 mg total) by mouth daily as needed for fluid or edema. 09/05/15   Alba Cory, MD  hydrOXYzine (ATARAX/VISTARIL) 25 MG tablet Take 1 tablet (25 mg total) by mouth 3 (three) times daily as needed. 04/01/16   Alba Cory, MD  Hypromellose 0.3 % SOLN  Apply 1 drop to eye 2 (two) times daily. 06/16/15   Alba Cory, MD  levocetirizine (XYZAL) 5 MG tablet Take 5 mg by mouth every evening.    [provider]  lidocaine (LIDODERM) 5 % Place 2 patches onto the skin daily. 09/05/15   Alba Cory, MD  loratadine (CLARITIN) 10 MG tablet Take 1 tablet (10 mg total) by mouth daily. 06/20/15   Alba Cory, MD  losartan-hydrochlorothiazide (HYZAAR) 50-12.5 MG tablet Take 1 tablet by mouth daily. 09/05/15   Alba Cory, MD  oxyCODONE-acetaminophen (PERCOCET/ROXICET) 5-325 MG tablet Take 1 tablet by mouth every 4 (four)  hours as needed for severe pain. 02/03/18   Merrily Brittle, MD  Polyethyl Glycol-Propyl Glycol (SYSTANE) 0.4-0.3 % GEL ophthalmic gel Place 1 application into both eyes 2 (two) times daily. 06/20/15   Alba Cory, MD  potassium chloride (KLOR-CON) 20 MEQ packet Take 20 mEq by mouth daily. 09/05/15   Alba Cory, MD  prednisoLONE acetate (PRED FORTE) 1 % ophthalmic suspension Place 1 drop into the left eye 4 (four) times daily.    [provider]  prednisoLONE acetate (PRED MILD) 0.12 % ophthalmic suspension Place into both eyes daily as needed.    [provider]    Allergies  Allergen Reactions  . Xolair [Omalizumab] Shortness Of Breath  . Metaxalone Rash  . Ace Inhibitors Cough  . Nsaids Hives  . Sulfa Antibiotics Hives  . Clindamycin/Lincomycin Swelling and Rash  . Tramadol Swelling and Rash    Family History  Problem Relation Age of Onset  . Hypertension Father   . Early death Mother   . Thyroid cancer Sister   . Deep vein thrombosis Sister   . Parkinson's disease Brother   . Prostate cancer Brother     Social History Social History   Tobacco Use  . Smoking status: Never Smoker  . Smokeless tobacco: Never Used  Substance Use Topics  . Alcohol use: Yes    Alcohol/week: 0.0 standard drinks    Comment: occasional  . Drug use: No    Review of Systems  Constitutional: Negative for fever. Eyes: Negative for visual changes. ENT: Negative for sore throat. Cardiovascular: Negative for chest pain. Respiratory: Positive for shortness of breath. Gastrointestinal: Negative for abdominal pain, vomiting and diarrhea. Genitourinary: Negative for dysuria. Musculoskeletal: Positive for low back pain and pain into the right leg which is more significant. Skin: Negative for rash. Neurological: Positive for headache that developed on the ambulance on the way over, moderate and global.    ____________________________________________   PHYSICAL  EXAM:  VITAL SIGNS: ED Triage Vitals  Enc Vitals Group     BP 03/11/18 1210 (!) 176/93     Pulse Rate 03/11/18 1210 (!) 116     Resp 03/11/18 1210 (!) 22     Temp 03/11/18 1210 98.2 F (36.8 C)     Temp Source 03/11/18 1210 Oral     SpO2 03/11/18 1208 98 %     Weight 03/11/18 1222 258 lb (117 kg)     Height 03/11/18 1222 5\' 5"  (1.651 m)     Head Circumference --      Peak Flow --      Pain Score 03/11/18 1221 9     Pain Loc --      Pain Edu? --      Excl. in GC? --      Constitutional: Alert and oriented.  Seems quite anxious. HEENT      Head: Normocephalic and  atraumatic.      Eyes: Conjunctivae are normal. Pupils equal and round.       Ears:         Nose: No congestion/rhinnorhea.      Mouth/Throat: Mucous membranes are moist.      Neck: No stridor. Cardiovascular/Chest: Tachycardic rate, regular rhythm.  No murmurs, rubs, or gallops. Respiratory: Normal respiratory effort without tachypnea nor retractions. Breath sounds are clear and equal bilaterally. No wheezes/rales/rhonchi. Gastrointestinal: Soft. No distention, no guarding, no rebound. Nontender.  Obese Genitourinary/rectal:Deferred Musculoskeletal: Nontender with normal range of motion in all extremities.  2+ edema left lower extremity, 3+ edema/swelling right lower extremity.  No redness. Neurologic:  Normal speech and language. No gross or focal neurologic deficits are appreciated. Skin:  Skin is warm, dry and intact. No rash noted. Psychiatric: Mood and affect are normal. Speech and behavior are normal. Patient exhibits appropriate insight and judgment.   ____________________________________________  LABS (pertinent positives/negatives) I, Governor Rooks, MD the attending physician have reviewed the labs noted below.  Labs Reviewed  CBC WITH DIFFERENTIAL/PLATELET - Abnormal; Notable for the following components:      Result Value   Neutro Abs 8.1 (*)    Lymphs Abs 0.8 (*)    Monocytes Absolute 0.1 (*)     All other components within normal limits  BASIC METABOLIC PANEL - Abnormal; Notable for the following components:   Potassium 5.2 (*)    Glucose, Bld 130 (*)    Calcium 8.6 (*)    All other components within normal limits  TROPONIN I    ____________________________________________    EKG I, Governor Rooks, MD, the attending physician have personally viewed and interpreted all ECGs.  111 bpm.  Sinus tachycardia.  Right axis.  Nonspecific ST and T wave. ____________________________________________  RADIOLOGY   Chest x-ray one view portable reviewed by me and interpreted by radiologist: No acute finding  Chest CT angios for PE: I reviewed the radiologist report: Pending  Ultrasound right lower extremity radiologist report no DVT. __________________________________________  PROCEDURES  Procedure(s) performed: None  Procedures  Critical Care performed: CRITICAL CARE Performed by: Governor Rooks   Total critical care time: 30 minutes  Critical care time was exclusive of separately billable procedures and treating other patients.  Critical care was necessary to treat or prevent imminent or life-threatening deterioration.  Critical care was time spent personally by me on the following activities: development of treatment plan with patient and/or surrogate as well as nursing, discussions with consultants, evaluation of patient's response to treatment, examination of patient, obtaining history from patient or surrogate, ordering and performing treatments and interventions, ordering and review of laboratory studies, ordering and review of radiographic studies, pulse oximetry and re-evaluation of patient's condition.    ____________________________________________  ED COURSE / ASSESSMENT AND PLAN  Pertinent labs & imaging results that were available during my care of the patient were reviewed by me and considered in my medical decision making (see chart for details).     Patient with a history of prior significant PE in 2016, recent surgery where she was off of Eliquis and then postsurgically found to have a new right DVT, now patient is having respiratory symptoms in addition to worsening right leg pain and is concerned that this feels like when she had a PE previously.  No specific chest pain make me concerned about ACS at this point time.  She appears extremely uncomfortable in terms of pain and is noting 9 out of 10 pain.  O2  sat 93 up to 99%.  Patient states that she has not had hypoxia even when she had saddle PE with what sounds like right heart strain at that time.   I was able to review the [facility].  Patient had an ultrasound 02/02/2018 with no DVT.  She had a postoperative right lower extremity ultrasound on 02/25/2018 which showed right posterior tibial DVT.  She had a CT angios on 02/28/2018 that showed no PE.  Patient states that her right leg pain and low back pain are significantly worse than they were just after surgery.  I am going to do ultrasound to make sure no worsening DVT since being on Eliquis.  She is also complaining of worsening dyspnea and we discussed risk versus benefit obtaining CT and chose to proceed.  With respect to the surgery on her back, she states that she was told that it is possible the surgery might not work and that she might need pain block/implantable device.  However given that her pain is significantly worse, will consider imaging to make sure there is no postsurgical complications such as hematoma.  Ultrasound negative for DVT at this point time.  Patient care transferred to Dr. Mayford Knife at shift change 3 PM.  MRI of the lumbar spine as well as CT PE are pending.  CONSULTATIONS: None   Patient / Family / Caregiver informed of clinical course, medical decision-making process, and agree with plan.    ___________________________________________   FINAL CLINICAL IMPRESSION(S) / ED  DIAGNOSES   Final diagnoses:  Acute right-sided low back pain with sciatica, sciatica laterality unspecified  Dyspnea, unspecified type      ___________________________________________         Note: This dictation was prepared with Dragon dictation. Any transcriptional errors that result from this process are unintentional    Governor Rooks, MD 03/11/18 1512

## 2018-03-11 NOTE — ED Notes (Signed)
Pt over for U/S at this time, MRI screened pt over the phone while in U/S

## 2018-03-12 LAB — CBC
HEMATOCRIT: 35.2 % (ref 35.0–47.0)
HEMOGLOBIN: 12 g/dL (ref 12.0–16.0)
MCH: 27.7 pg (ref 26.0–34.0)
MCHC: 34.2 g/dL (ref 32.0–36.0)
MCV: 81.2 fL (ref 80.0–100.0)
PLATELETS: 292 10*3/uL (ref 150–440)
RBC: 4.34 MIL/uL (ref 3.80–5.20)
RDW: 14.2 % (ref 11.5–14.5)
WBC: 11.6 10*3/uL — AB (ref 3.6–11.0)

## 2018-03-12 LAB — BASIC METABOLIC PANEL
Anion gap: 7 (ref 5–15)
BUN: 13 mg/dL (ref 8–23)
CHLORIDE: 104 mmol/L (ref 98–111)
CO2: 26 mmol/L (ref 22–32)
Calcium: 8.5 mg/dL — ABNORMAL LOW (ref 8.9–10.3)
Creatinine, Ser: 0.85 mg/dL (ref 0.44–1.00)
GFR calc Af Amer: >60 mL/min (ref >60)
GFR calc non Af Amer: >60 mL/min (ref >60)
GLUCOSE: 198 mg/dL — AB (ref 70–99)
Potassium: 4.1 mmol/L (ref 3.5–5.1)
Sodium: 137 mmol/L (ref 135–145)

## 2018-03-12 MED ORDER — CEPHALEXIN 500 MG PO CAPS: 500.0000 mg | ORAL_CAPSULE | Freq: Four times a day (QID) | ORAL | 0 refills | Status: AC

## 2018-03-12 MED ORDER — SODIUM CHLORIDE 0.9 % IV SOLN
1.0000 g | INTRAVENOUS | Status: DC
  Administered 2018-03-12: 1 g via INTRAVENOUS
  Filled 2018-03-12: qty 1

## 2018-03-12 MED ORDER — OXYCODONE-ACETAMINOPHEN 5-325 MG PO TABS: 1.0000 | ORAL_TABLET | Freq: Four times a day (QID) | ORAL | 0 refills | Status: AC | PRN

## 2018-03-12 NOTE — Discharge Summary (Signed)
[facility] [place] at [facility]   PATIENT NAME: Jamela Cumbo    MR#:  [id number]  DATE OF BIRTH:  11-29-1955  DATE OF ADMISSION:  03/11/2018   ADMITTING PHYSICIAN: Houston Siren, MD  DATE OF DISCHARGE: No discharge date for patient encounter.  PRIMARY CARE PHYSICIAN: Karna Dupes, MD   ADMISSION DIAGNOSIS:  Dyspnea, unspecified type [R06.00] Acute right-sided low back pain with sciatica, sciatica laterality unspecified [M54.40] DISCHARGE DIAGNOSIS:  Active Problems:   Back pain  SECONDARY DIAGNOSIS:   Past Medical History:  Diagnosis Date  . Allergic rhinitis   . Chronic low back pain   . History of blood transfusion   . History of chicken pox   . Hx of migraines   . Hyperlipidemia   . Hypertension   . Iritis, chronic   . Positive TB test   . Pulmonary embolism (HCC)   . Thyroid disease    HOSPITAL COURSE:  62 year old female with past medical history of pulmonary embolism, hypothyroidism, IBS, hypertension, hyperlipidemia, chronic back pain, history of lumbar radiculopathy and status post recent laminectomy for spinal stenosis with L5-S1 nerve root impingement who is currently at rehab presents to the hospital due to worsening back pain.  1.  Back pain-patient had worsening back pain since her surgery 3 weeks ago.  She received multiple doses of IV Dilaudid with minimal improvement in her pain.  She is being admitted under observation for pain control.  The patient's MRI of the lumbar spine shows postsurgical changes with a subacute hematoma.  The ER physician discussed with neurosurgeon on-call who did her surgery 3 weeks ago he did not recommend any further intervention but rather just some supportive care with pain control and physical therapy. She has been treated with chronic back pain meds including her OxyContin and as needed oxycodone along with some IV Dilaudid.  She feels better.  2.  Urinary tract infection-based off a urinalysis she had  at the skilled nursing facility 2 days ago and she is symptomatic with some dysuria. She has been treated with IV ceftriaxone, changed to Keflex p.o. for 3 days.  3.  History of pulmonary embolism-continue Eliquis.  Patient had a repeat CT chest with contrast done on admission showing no acute pulmonary embolism.  4.  Neuropathy-continue gabapentin.  5.  History of irritable bowel syndrome-continue Bentyl.  6.  Essential hypertension-continue Norvasc.  7.  GERD-continue Pepcid, Protonix.  DISCHARGE CONDITIONS:  Stable, discharged to skilled nursing facility today. CONSULTS OBTAINED:   DRUG ALLERGIES:   Allergies  Allergen Reactions  . Xolair [Omalizumab] Shortness Of Breath  . Metaxalone Rash  . Ace Inhibitors Cough  . Nsaids Hives  . Sulfa Antibiotics Hives  . Clindamycin/Lincomycin Swelling and Rash  . Tramadol Swelling and Rash   DISCHARGE MEDICATIONS:   Allergies as of 03/12/2018      Reactions   Xolair [omalizumab] Shortness Of Breath   Metaxalone Rash   Ace Inhibitors Cough   Nsaids Hives   Sulfa Antibiotics Hives   Clindamycin/lincomycin Swelling, Rash   Tramadol Swelling, Rash      Medication List    TAKE these medications   acetaminophen 325 MG tablet Commonly known as:  TYLENOL Take 650 mg by mouth every 4 (four) hours as needed for mild pain, moderate pain or fever.   acidophilus Caps capsule Take 1 capsule by mouth daily.   amLODipine 5 MG tablet Commonly known as:  NORVASC Take 5 mg by mouth daily.  ASPERCREME LIDOCAINE 4 % Ptch Generic drug:  Lidocaine Apply 1 patch topically every 12 (twelve) hours.   carboxymethylcellulose 0.5 % Soln Commonly known as:  REFRESH PLUS Place 1 drop into both eyes daily.   cephALEXin 500 MG capsule Commonly known as:  KEFLEX Take 1 capsule (500 mg total) by mouth 4 (four) times daily for 3 days.   cetirizine 10 MG tablet Commonly known as:  ZYRTEC Take 10 mg by mouth at bedtime.   dicyclomine 20  MG tablet Commonly known as:  BENTYL Take 1 tablet (20 mg total) by mouth 3 (three) times daily as needed for spasms. What changed:  when to take this   docusate sodium 100 MG capsule Commonly known as:  COLACE Take 100 mg by mouth 2 (two) times daily.   ELIQUIS 5 MG Tabs tablet Generic drug:  apixaban Take 5 mg by mouth 2 (two) times daily.   furosemide 20 MG tablet Commonly known as:  LASIX Take 2 tablets (40 mg total) by mouth daily as needed for fluid or edema. What changed:  how much to take   gabapentin 100 MG capsule Commonly known as:  NEURONTIN Take 100 mg by mouth 3 (three) times daily.   hydrOXYzine 25 MG tablet Commonly known as:  ATARAX/VISTARIL Take 1 tablet (25 mg total) by mouth 3 (three) times daily as needed.   omeprazole 40 MG capsule Commonly known as:  PRILOSEC Take 40 mg by mouth daily.   oxyCODONE 15 mg 12 hr tablet Commonly known as:  OXYCONTIN Take 15 mg by mouth every 12 (twelve) hours.   oxyCODONE-acetaminophen 5-325 MG tablet Commonly known as:  PERCOCET/ROXICET Take 1 tablet by mouth every 6 (six) hours as needed for up to 3 days (breakthrough pain). What changed:  Another medication with the same name was removed. Continue taking this medication, and follow the directions you see here.   polyethylene glycol packet Commonly known as:  MIRALAX / GLYCOLAX Take 17 g by mouth daily.   potassium chloride 20 MEQ packet Commonly known as:  KLOR-CON Take 20 mEq by mouth daily. What changed:    when to take this  reasons to take this   prednisoLONE acetate 0.12 % ophthalmic suspension Commonly known as:  PRED MILD Place 1 drop into both eyes daily as needed (irratation).   ranitidine 150 MG tablet Commonly known as:  ZANTAC Take 150 mg by mouth 2 (two) times daily.   senna 8.6 MG Tabs tablet Commonly known as:  SENOKOT Take 2 tablets by mouth 2 (two) times daily.   tiZANidine 4 MG tablet Commonly known as:  ZANAFLEX Take 4 mg by  mouth 3 (three) times daily as needed for muscle spasms.   triamcinolone cream 0.1 % Commonly known as:  KENALOG Apply 1 application topically 2 (two) times daily as needed (rash).        DISCHARGE INSTRUCTIONS:  See AVS.  If you experience worsening of your admission symptoms, develop shortness of breath, life threatening emergency, suicidal or homicidal thoughts you must seek medical attention immediately by calling 911 or calling your MD immediately  if symptoms less severe.  You Must read complete instructions/literature along with all the possible adverse reactions/side effects for all the Medicines you take and that have been prescribed to you. Take any new Medicines after you have completely understood and accpet all the possible adverse reactions/side effects.   Please note  You were cared for by a hospitalist during your hospital stay. If you  have any questions about your discharge medications or the care you received while you were in the hospital after you are discharged, you can call the unit and asked to speak with the hospitalist on call if the hospitalist that took care of you is not available. Once you are discharged, your primary care physician will handle any further medical issues. Please note that NO REFILLS for any discharge medications will be authorized once you are discharged, as it is imperative that you return to your primary care physician (or establish a relationship with a primary care physician if you do not have one) for your aftercare needs so that they can reassess your need for medications and monitor your lab values.    On the day of Discharge:  VITAL SIGNS:  Blood pressure 108/71, pulse 73, temperature 98.3 F (36.8 C), temperature source Oral, resp. rate 18, height 5\' 5"  (1.651 m), weight 117 kg, SpO2 93 %. PHYSICAL EXAMINATION:  GENERAL:  62 y.o.-year-old patient lying in the bed with no acute distress.  Obese obesity. EYES: Pupils equal, round,  reactive to light and accommodation. No scleral icterus. Extraocular muscles intact.  HEENT: Head atraumatic, normocephalic. Oropharynx and nasopharynx clear.  NECK:  Supple, no jugular venous distention. No thyroid enlargement, no tenderness.  LUNGS: Normal breath sounds bilaterally, no wheezing, rales,rhonchi or crepitation. No use of accessory muscles of respiration.  CARDIOVASCULAR: S1, S2 normal. No murmurs, rubs, or gallops.  ABDOMEN: Soft, non-tender, non-distended. Bowel sounds present. No organomegaly or mass.  EXTREMITIES: No pedal edema, cyanosis, or clubbing.  NEUROLOGIC: Cranial nerves II through XII are intact. Muscle strength 5/5 in all extremities. Sensation intact. Gait not checked.  PSYCHIATRIC: The patient is alert and oriented x 3.  SKIN: No obvious rash, lesion, or ulcer.  DATA REVIEW:   CBC Recent Labs  Lab 03/12/18 0201  WBC 11.6*  HGB 12.0  HCT 35.2  PLT 292    Chemistries  Recent Labs  Lab 03/12/18 0201  NA 137  K 4.1  CL 104  CO2 26  GLUCOSE 198*  BUN 13  CREATININE 0.85  CALCIUM 8.5*     Microbiology Results  No results found for this or any previous visit.  RADIOLOGY:  Ct Angio Chest Pe W And/or Wo Contrast  Result Date: 03/11/2018 CLINICAL DATA:  62 year old female with acute shortness of breath today. History of spine surgery 2 weeks ago. Prior history of DVT/pulmonary emboli. EXAM: CT ANGIOGRAPHY CHEST WITH CONTRAST TECHNIQUE: Multidetector CT imaging of the chest was performed using the standard protocol during bolus administration of intravenous contrast. Multiplanar CT image reconstructions and MIPs were obtained to evaluate the vascular anatomy. CONTRAST:  75mL OMNIPAQUE IOHEXOL 350 MG/ML SOLN COMPARISON:  03/11/2018 chest radiograph and 02/18/2017 CT FINDINGS: Cardiovascular: This is a technically borderline study with moderate respiratory motion artifact in the LOWER lungs. No definite pulmonary emboli are identified. UPPER limits normal  heart size noted. No thoracic aortic aneurysm or pericardial effusion. Mediastinum/Nodes: No enlarged mediastinal, hilar, or axillary lymph nodes. Thyroid gland, trachea, and esophagus demonstrate no significant findings. Lungs/Pleura: Mild bibasilar atelectasis noted. There is no evidence of definite airspace disease or consolidation. No suspicious nodule or mass noted. Upper Abdomen: No acute abnormality. Musculoskeletal: No chest wall abnormality. No acute or significant osseous findings. Review of the MIP images confirms the above findings. IMPRESSION: 1. No evidence of pulmonary emboli but evaluation is limited in the LOWER lungs due to respiratory motion artifact. 2. Mild bibasilar atelectasis. Electronically Signed  By: Harmon Pier M.D.   On: 03/11/2018 16:16   Mr Lumbar Spine W Wo Contrast  Result Date: 03/11/2018 CLINICAL DATA:  Worsening low back and right leg pain. Lumbar surgery 2 weeks ago. On Eliquis for DVT. EXAM: MRI LUMBAR SPINE WITHOUT AND WITH CONTRAST TECHNIQUE: Multiplanar and multiecho pulse sequences of the lumbar spine were obtained without and with intravenous contrast. CONTRAST:  20mL MULTIHANCE GADOBENATE DIMEGLUMINE 529 MG/ML IV SOLN COMPARISON:  CT abdomen and pelvis 02/03/2018. MRI sacrum 06/08/2012. FINDINGS: Segmentation:  Standard. Alignment:  Normal. Vertebrae: No fracture, suspicious osseous lesion, or significant marrow edema. Conus medullaris and cauda equina: Conus extends to the L1-2 level. Conus and cauda equina appear normal. Paraspinal and other soft tissues: Postoperative changes in the posterior lower lumbar soft tissues. Dorsal epidural fluid collection in the laminectomy bed extending from L4-S1 measures 3.4 x 3.1 x 7.0 cm and demonstrates intermediate T1 signal intensity with enhancement in the surrounding soft tissues. A separate fluid collection along the incision in the subcutaneous tissues measures 3.4 x 5.2 x 5.9 cm. Disc levels: L1-2 through L3-4: Normal  discs. Mild facet arthrosis and ligamentum flavum hypertrophy without stenosis. L4-5: Disc desiccation. Prior posterior decompression. Enhancing epidural granulation tissue bilaterally. At most minimal disc bulging and mild facet arthrosis without stenosis. L5-S1: Prior posterior decompression with above described fluid collection extending into the lateral aspects of the spinal canal bilaterally and contacting the S1 nerve roots bilaterally without frank nerve root compression. Chronic complete effacement of the thecal sac from the L5 vertebral body level inferiorly due to extensive epidural fat. Minimal disc bulging without neural foraminal stenosis. IMPRESSION: 1. Postoperative changes in the lower lumbar spine including a 7 cm dorsal epidural fluid collection from L4-S1 favored to predominantly reflect subacute hematoma. Infection is not excluded by imaging. The fluid collection contacts the S1 nerve roots in the lateral aspects of the spinal canal without definite neural compression. 2. Mild diffuse lumbar facet arthrosis. Electronically Signed   By: Sebastian Ache M.D.   On: 03/11/2018 16:55   US Venous Img Lower Unilateral Right  Result Date: 03/11/2018 CLINICAL DATA:  Pain and swelling EXAM: RIGHT LOWER EXTREMITY VENOUS DOPPLER ULTRASOUND TECHNIQUE: Gray-scale sonography with compression, as well as color and duplex ultrasound, were performed to evaluate the deep venous system from the level of the common femoral vein through the popliteal and proximal calf veins. COMPARISON:  None FINDINGS: Normal compressibility of the common femoral, superficial femoral, and popliteal veins, as well as the proximal calf veins. No filling defects to suggest DVT on grayscale or color Doppler imaging. Doppler waveforms show normal direction of venous flow, normal respiratory phasicity and response to augmentation. Survey views of the contralateral common femoral vein are unremarkable. IMPRESSION: No evidence of right lower  extremity deep vein thrombosis. Electronically Signed   By: Corlis Leak M.D.   On: 03/11/2018 14:07   Dg Chest Port 1 View  Result Date: 03/11/2018 CLINICAL DATA:  Shortness of breath, right leg swelling, and back pain. History of pulmonary embolism. EXAM: PORTABLE CHEST 1 VIEW COMPARISON:  Chest radiographs and CTA 02/18/2017 FINDINGS: The cardiac silhouette is borderline enlarged. No airspace consolidation, edema, sizable pleural effusion, or pneumothorax is identified. No acute osseous abnormality is seen. IMPRESSION: No active disease. Electronically Signed   By: Sebastian Ache M.D.   On: 03/11/2018 13:40     Management plans discussed with the patient, family and they are in agreement.  CODE STATUS: Full Code   TOTAL TIME  TAKING CARE OF THIS PATIENT: 28 minutes.    Shaune Pollack M.D on 03/12/2018 at 9:15 AM  Between 7am to 6pm - Pager - [phone]  After 6pm go to [email or web address] - Social research officer, government  [facility] [place] [facility]  Office  [phone]  CC: Primary care physician; Karna Dupes, MD   Note: This dictation was prepared with Dragon dictation along with smaller phrase technology. Any transcriptional errors that result from this process are unintentional.

## 2018-03-12 NOTE — NC FL2 (Signed)
[place] MEDICAID FL2 LEVEL OF CARE SCREENING TOOL     IDENTIFICATION  Patient Name: Chelsea Werner Birthdate: [birth year] Sex: female Admission Date (Current Location): 03/11/2018  [place] and [place] Number:  Chiropodist and Address:  [facility], [address], [place], [place] [address]      Provider Number: [id number]  Attending Physician Name and Address:  Shaune Pollack, MD  Relative Name and Phone Number:       Current Level of Care: Hospital Recommended Level of Care: Skilled Nursing Facility Prior Approval Number:    Date Approved/Denied:   PASRR Number: [id number]  Discharge Plan: SNF    Current Diagnoses: Patient Active Problem List   Diagnosis Date Noted  . Back pain 03/11/2018  . 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 Calculus of parotid gland duct 12/11/2015  . 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 Fibrocystic breast disease 01/03/2015  . Cervical radiculitis 01/03/2015  . Extreme obesity 01/03/2015  . Migraine with aura and without status migrainosus, not intractable 01/03/2015  . LA (lupus anticoagulant) disorder (HCC) 01/03/2015  . 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 Benign hypertension 01/03/2015  . Vitamin D deficiency 01/03/2015  . History of TB (tuberculosis) 01/03/2015  . Hives 01/03/2015  . History of pulmonary embolus (PE) 12/16/2014  . Thrombocytopenia (HCC) 07/01/2014  . Chronic right shoulder pain 04/04/2014  . Edema   . Sacral back pain 04/10/2012  . Chronic low back pain   . Iritis, chronic   . Allergic rhinitis   . Personal history of tuberculosis 10/26/2007    Orientation RESPIRATION BLADDER Height & Weight     Self, Time, Situation, Place  Normal Continent Weight: 258 lb (117 kg) Height:  5\' 5"  (165.1 cm)  BEHAVIORAL SYMPTOMS/MOOD NEUROLOGICAL BOWEL NUTRITION STATUS      Continent Diet(Heart Healthy)  AMBULATORY STATUS COMMUNICATION OF NEEDS Skin   Extensive Assist Verbally  Normal                       Personal Care Assistance Level of Assistance  Bathing, Feeding, Dressing Bathing Assistance: Limited assistance Feeding assistance: Independent Dressing Assistance: Limited assistance     Functional Limitations Info  Sight, Hearing, Speech Sight Info: Adequate Hearing Info: Adequate Speech Info: Adequate    SPECIAL CARE FACTORS FREQUENCY  PT (By licensed PT)     PT Frequency: Resumption of care              Contractures Contractures Info: Not present    Additional Factors Info  Code Status, Allergies Code Status Info: Full Allergies Info: Xolair Omalizumab, Metaxalone, Ace Inhibitors, Nsaids, Sulfa Antibiotics, Clindamycin/lincomycin, Tramadol           Current Medications (03/12/2018):  This is the current hospital active medication list Current Facility-Administered Medications  Medication Dose Route Frequency Provider Last Rate Last Dose  . acetaminophen (TYLENOL) tablet 650 mg  650 mg Oral Q6H PRN Houston Siren, MD       Or  . acetaminophen (TYLENOL) suppository 650 mg  650 mg Rectal Q6H PRN Houston Siren, MD      . acidophilus (RISAQUAD) capsule 1 capsule  1 capsule Oral Daily Houston Siren, MD   1 capsule at 03/12/18 0931  . amLODipine (NORVASC) tablet 5 mg  5 mg Oral Daily Sainani, Rolly Pancake, MD      . apixaban (ELIQUIS) tablet 5 mg  5 mg Oral BID Houston Siren, MD   5 mg  at 03/12/18 0927  . cefTRIAXone (ROCEPHIN) 1 g in sodium chloride 0.9 % 100 mL IVPB  1 g Intravenous Q24H Shaune Pollack, MD      . dicyclomine (BENTYL) tablet 20 mg  20 mg Oral TID Houston Siren, MD      . docusate sodium (COLACE) capsule 100 mg  100 mg Oral BID Houston Siren, MD   100 mg at 03/12/18 1191  . famotidine (PEPCID) tablet 20 mg  20 mg Oral BID Houston Siren, MD   20 mg at 03/12/18 4782  . furosemide (LASIX) tablet 20 mg  20 mg Oral Daily PRN Houston Siren, MD      . gabapentin (NEURONTIN) capsule 100 mg  100 mg Oral TID  Houston Siren, MD   100 mg at 03/12/18 0927  . HYDROmorphone (DILAUDID) injection 1 mg  1 mg Intravenous Q4H PRN Houston Siren, MD      . hydrOXYzine (ATARAX/VISTARIL) tablet 25 mg  25 mg Oral TID PRN Houston Siren, MD      . lidocaine (LIDODERM) 5 % 1 patch  1 patch Transdermal Daily Houston Siren, MD   1 patch at 03/12/18 0931  . loratadine (CLARITIN) tablet 10 mg  10 mg Oral Daily Houston Siren, MD   10 mg at 03/12/18 0927  . methylPREDNISolone sodium succinate (SOLU-MEDROL) 40 mg/mL injection 40 mg  40 mg Intravenous Q12H Houston Siren, MD   40 mg at 03/12/18 0537  . ondansetron (ZOFRAN) tablet 4 mg  4 mg Oral Q6H PRN Houston Siren, MD       Or  . ondansetron (ZOFRAN) injection 4 mg  4 mg Intravenous Q6H PRN Houston Siren, MD      . oxyCODONE (OXYCONTIN) 12 hr tablet 15 mg  15 mg Oral Q12H Houston Siren, MD   15 mg at 03/12/18 0927  . oxyCODONE-acetaminophen (PERCOCET/ROXICET) 5-325 MG per tablet 1 tablet  1 tablet Oral Q6H PRN Houston Siren, MD   1 tablet at 03/12/18 0537  . pantoprazole (PROTONIX) EC tablet 40 mg  40 mg Oral Daily Houston Siren, MD   40 mg at 03/12/18 0927  . polyethylene glycol (MIRALAX / GLYCOLAX) packet 17 g  17 g Oral Daily Houston Siren, MD   17 g at 03/12/18 0929  . polyvinyl alcohol (LIQUIFILM TEARS) 1.4 % ophthalmic solution 1 drop  1 drop Both Eyes Daily Houston Siren, MD   1 drop at 03/12/18 0932  . potassium chloride (KLOR-CON) packet 20 mEq  20 mEq Oral Daily PRN Houston Siren, MD      . prednisoLONE acetate (PRED MILD) 0.12 % ophthalmic suspension 1 drop  1 drop Both Eyes Daily PRN Houston Siren, MD      . senna (SENOKOT) tablet 17.2 mg  2 tablet Oral BID Houston Siren, MD   17.2 mg at 03/12/18 0927  . tiZANidine (ZANAFLEX) tablet 4 mg  4 mg Oral TID PRN Houston Siren, MD      . triamcinolone cream (KENALOG) 0.1 % 1 application  1 application Topical BID PRN Houston Siren, MD         Discharge  Medications: Please see discharge summary for a list of discharge medications.  Relevant Imaging Results:  Relevant Lab Results:   Additional Information SS#[id number]  Judi Cong, LCSW

## 2018-03-12 NOTE — Clinical Social Work Note (Signed)
Clinical Social Work Assessment  Patient Details  Name: Chelsea Werner MRN: [id number] Date of Birth: May 16, 1956  Date of referral:  03/12/18               Reason for consult:  Facility Placement                Permission sought to share information with:  Chartered certified accountant granted to share information::  Yes, Verbal Permission Granted  Name::        Agency::  [facility]  Relationship::     Contact Information:     Housing/Transportation Living arrangements for the past 2 months:  [place], [place] of Information:  Patient, Medical Team, Facility Patient Interpreter Needed:  None Criminal Activity/Legal Involvement Pertinent to Current Situation/Hospitalization:  No - Comment as needed Significant Relationships:  Other Family Members, Siblings Lives with:  Self Do you feel safe going back to the place where you live?  Yes Need for family participation in patient care:  No (Coment)  Care giving concerns:  Patient admitted from [facility]   Social Worker assessment / plan:  CSW met with the patient at bedside to discuss discharge planning. The patient has been in short term rehab due to back surgery for the past 3 weeks, and she would like to return to [facility] to finish her rehab. The patient plans to transport via family today.  The CSW contacted Lovena Le at [facility] who confirmed that the patient can return but that Christella Scheuermann would have to authorize and is unable to do so on the weekend. Lovena Le agreed to accept a 5-day LOG to cover the time for reauthorization. The CSW confirmed the ability to provide such with CSW leadership. The CSW has sent all discharge documentation to the facility and will sign off once the discharge packet is delivered to the chart. Please consult should additional needs arise.  Employment status:  [employer] information:  Managed Care PT Recommendations:  Not assessed at this  time Information / Referral to community resources:     Patient/Family's Response to care:  The patient was pleased with her care and eager to return to [facility].  Patient/Family's Understanding of and Emotional Response to Diagnosis, Current Treatment, and Prognosis:  The patient understands that she will continue her rehab at [facility] place and will discharge today.  Emotional Assessment Appearance:  Appears stated age Attitude/Demeanor/Rapport:  Charismatic, Gracious, Engaged Affect (typically observed):  Appropriate, Pleasant Orientation:  Oriented to Self, Oriented to Place, Oriented to  Time, Oriented to Situation Alcohol / Substance use:  Never Used Psych involvement (Current and /or in the community):  No (Comment)  Discharge Needs  Concerns to be addressed:  Care Coordination Readmission within the last 30 days:  No Current discharge risk:  Physical Impairment Barriers to Discharge:  No Barriers Identified   Zettie Pho, LCSW 03/12/2018, 10:24 AM

## 2018-03-13 ENCOUNTER — Encounter: Payer: Self-pay | Admitting: Adult Health

## 2018-03-13 ENCOUNTER — Non-Acute Institutional Stay (SKILLED_NURSING_FACILITY): Payer: Managed Care, Other (non HMO) | Admitting: Adult Health

## 2018-03-13 DIAGNOSIS — K5903 Drug induced constipation: Secondary | ICD-10-CM

## 2018-03-13 DIAGNOSIS — T402X5A Adverse effect of other opioids, initial encounter: Secondary | ICD-10-CM

## 2018-03-13 DIAGNOSIS — N390 Urinary tract infection, site not specified: Secondary | ICD-10-CM

## 2018-03-13 DIAGNOSIS — J309 Allergic rhinitis, unspecified: Secondary | ICD-10-CM | POA: Diagnosis not present

## 2018-03-13 DIAGNOSIS — M48061 Spinal stenosis, lumbar region without neurogenic claudication: Secondary | ICD-10-CM | POA: Insufficient documentation

## 2018-03-13 DIAGNOSIS — I10 Essential (primary) hypertension: Secondary | ICD-10-CM | POA: Diagnosis not present

## 2018-03-13 DIAGNOSIS — M5416 Radiculopathy, lumbar region: Secondary | ICD-10-CM

## 2018-03-13 LAB — HIV ANTIBODY (ROUTINE TESTING W REFLEX): HIV Screen 4th Generation wRfx: NONREACTIVE

## 2018-03-13 NOTE — Progress Notes (Signed)
Location:   [facility] Room Number: [phone] Place of Service:  SNF ([id number])   CODE STATUS: FULL  Allergies  Allergen Reactions  . Xolair [Omalizumab] Shortness Of Breath  . Metaxalone Rash  . Ace Inhibitors Cough  . Nsaids Hives  . Sulfa Antibiotics Hives  . Clindamycin/Lincomycin Swelling and Rash  . Tramadol Swelling and Rash    Chief Complaint  Patient presents with  . Hospitalization Follow-up    Hositalization Follow-up     HPI:  She has had a re-do of L4-5, L5-S1 bilateral laminectomies 3 weeks ago. She was taken to the ED on 03-11-18 and released on 03-12-18. She states that she was given steroids in the ED which have helped her pain a great deal today and she is able to ambulate with a walker. She denies any worsening radicular pain. She denies having any GI symptoms and is not sure why she is on those medications. She is here for short term rehab with her goal to return back home. She is continuing to be followed for her chronic illnesses including: chronic back pain; hypertension all allergic rhinitis. There are no nursing concerns at this time.     Past Medical History:  Diagnosis Date  . Allergic rhinitis   . Chronic low back pain   . History of blood transfusion   . History of chicken pox   . Hx of migraines   . Hyperlipidemia   . Hypertension   . Iritis, chronic   . Positive TB test   . Pulmonary embolism (HCC)   . Thyroid disease     Past Surgical History:  Procedure Laterality Date  . ABDOMINAL HYSTERECTOMY  1999-2000  . BACK SURGERY  1984  . BREAST BIOPSY  1988, 2008  . CESAREAN SECTION    . MYOMECTOMY  1988  . PLANTAR FASCIECTOMY  2013    Social History   Socioeconomic History  . Marital status: Widowed    Spouse name: Not on file  . Number of children: 1  . Years of education: Not on file  . Highest education level: Not on file  Occupational History  . Occupation: Physicist, medical: [employer]  . Occupation:  Physicist, medical: [employer]  Social Needs  . Financial resource strain: Not on file  . Food insecurity:    Worry: Not on file    Inability: Not on file  . Transportation needs:    Medical: Not on file    Non-medical: Not on file  Tobacco Use  . Smoking status: Never Smoker  . Smokeless tobacco: Never Used  Substance and Sexual Activity  . Alcohol use: Yes    Alcohol/week: 0.0 standard drinks    Comment: occasional  . Drug use: No  . Sexual activity: Never  Lifestyle  . Physical activity:    Days per week: Not on file    Minutes per session: Not on file  . Stress: Not on file  Relationships  . Social connections:    Talks on phone: Not on file    Gets together: Not on file    Attends religious service: Not on file    Active member of club or organization: Not on file    Attends meetings of clubs or organizations: Not on file    Relationship status: Not on file  . Intimate partner violence:    Fear of current or ex partner: Not on file    Emotionally abused:  Not on file    Physically abused: Not on file    Forced sexual activity: Not on file  Other Topics Concern  . Not on file  Social History Narrative   Regular exercise-no   Caffeine Use-yes   Family History  Problem Relation Age of Onset  . Hypertension Father   . Early death Mother   . Thyroid cancer Sister   . Deep vein thrombosis Sister   . Parkinson's disease Brother   . Prostate cancer Brother       VITAL SIGNS BP 116/68   Pulse 75   Temp 98 F (36.7 C) (Oral)   Resp 18   Ht 5\' 5"  (1.651 m)   Wt 267 lb 11.2 oz (121.4 kg)   SpO2 100%   BMI 44.55 kg/m   Outpatient Encounter Medications as of 03/13/2018  Medication Sig  . acetaminophen (TYLENOL) 325 MG tablet Take 650 mg by mouth every 4 (four) hours as needed for mild pain, moderate pain or fever.  Marland Kitchen acidophilus (RISAQUAD) CAPS capsule Take 1 capsule by mouth daily.  Marland Kitchen amLODipine (NORVASC) 5 MG tablet Take 5 mg by mouth daily.   Marland Kitchen  apixaban (ELIQUIS) 5 MG TABS tablet Take 5 mg by mouth 2 (two) times daily.   . carboxymethylcellulose (REFRESH PLUS) 0.5 % SOLN Place 1 drop into both eyes daily.  . cephALEXin (KEFLEX) 500 MG capsule Take 1 capsule (500 mg total) by mouth 4 (four) times daily for 3 days.  . cetirizine (ZYRTEC) 10 MG tablet Take 10 mg by mouth at bedtime.   . dicyclomine (BENTYL) 20 MG tablet Take 20 mg by mouth 3 (three) times daily.  Marland Kitchen docusate sodium (COLACE) 100 MG capsule Take 100 mg by mouth 2 (two) times daily.   Marland Kitchen gabapentin (NEURONTIN) 100 MG capsule Take 100 mg by mouth 3 (three) times daily.   . hydrOXYzine (ATARAX/VISTARIL) 25 MG tablet Take 1 tablet (25 mg total) by mouth 3 (three) times daily as needed.  . Lidocaine (ASPERCREME LIDOCAINE) 4 % PTCH Apply 1 patch topically every 12 (twelve) hours.  . NON FORMULARY Diet Type: Regular  . omeprazole (PRILOSEC) 40 MG capsule Take 40 mg by mouth daily.   Marland Kitchen oxyCODONE (OXYCONTIN) 15 mg 12 hr tablet Take 15 mg by mouth every 12 (twelve) hours.   Marland Kitchen oxyCODONE-acetaminophen (PERCOCET/ROXICET) 5-325 MG tablet Take 1 tablet by mouth every 6 (six) hours as needed for up to 3 days (breakthrough pain).  . polyethylene glycol (MIRALAX / GLYCOLAX) packet Take 17 g by mouth daily. mix with 6-8 oz water for constipation  . potassium chloride SA (K-DUR,KLOR-CON) 20 MEQ tablet Take 20 mEq by mouth daily as needed. take only with Lasix  . prednisoLONE acetate (PRED MILD) 0.12 % ophthalmic suspension Place 1 drop into both eyes daily as needed (irratation).   . ranitidine (ZANTAC) 150 MG tablet Take 150 mg by mouth 2 (two) times daily.  Marland Kitchen senna (SENOKOT) 8.6 MG TABS tablet Take 2 tablets by mouth 2 (two) times daily.  Marland Kitchen tiZANidine (ZANAFLEX) 4 MG tablet Take 4 mg by mouth 3 (three) times daily as needed for muscle spasms.  Marland Kitchen triamcinolone cream (KENALOG) 0.1 % Apply 1 application topically 2 (two) times daily as needed (rash).  . [DISCONTINUED] dicyclomine (BENTYL) 20 MG  tablet Take 1 tablet (20 mg total) by mouth 3 (three) times daily as needed for spasms. (Patient taking differently: Take 20 mg by mouth 3 (three) times daily. )  . [DISCONTINUED] furosemide (  LASIX) 20 MG tablet Take 2 tablets (40 mg total) by mouth daily as needed for fluid or edema. (Patient taking differently: Take 20 mg by mouth daily as needed for fluid or edema. )  . [DISCONTINUED] furosemide (LASIX) 20 MG tablet Take 20 mg by mouth daily as needed.  . [DISCONTINUED] potassium chloride (KLOR-CON) 20 MEQ packet Take 20 mEq by mouth daily. (Patient taking differently: Take 20 mEq by mouth daily as needed (with Lasix). )   No facility-administered encounter medications on file as of 03/13/2018.      SIGNIFICANT DIAGNOSTIC EXAMS  LABS REVIEWED:   03-12-18: wbc 11.6; hgb 12.0; hct 35.2; mcv 81.2; plt 292; glucose 198; bun 13; creat 0.85; k+ 4.1; na++ 137; ca 8.5  Review of Systems  Constitutional: Negative for malaise/fatigue.  Respiratory: Negative for cough and shortness of breath.   Cardiovascular: Negative for chest pain, palpitations and leg swelling.  Gastrointestinal: Negative for abdominal pain, constipation and heartburn.  Musculoskeletal: Positive for back pain. Negative for joint pain and myalgias.       Controlled today is able to ambulate   Skin: Negative.   Neurological: Negative for dizziness.  Psychiatric/Behavioral: The patient is not nervous/anxious.     Physical Exam  Constitutional: She is oriented to person, place, and time. She appears well-developed and well-nourished. No distress.  Obese   Neck: No thyromegaly present.  Cardiovascular: Normal rate, regular rhythm, normal heart sounds and intact distal pulses.  Pulmonary/Chest: Effort normal and breath sounds normal. No respiratory distress.  Abdominal: Soft. Bowel sounds are normal. She exhibits no distension. There is no tenderness.  Musculoskeletal: Normal range of motion. She exhibits no edema.  Is  ambulating with a walker.   Lymphadenopathy:    She has no cervical adenopathy.  Neurological: She is alert and oriented to person, place, and time.  Skin: Skin is warm and dry. She is not diaphoretic.  Psychiatric: She has a normal mood and affect.      ASSESSMENT/ PLAN:  TODAY:   1. Benign hypertension: is stable b/p 116/68: will continue norvasc 5 mg daily   2.  Acute DVT right posterior tibial vein on 02-25-18: has  LA (lupus anticoagulant disorder) is stable is on long term eliquis 5 mg twice daily   3. Allergic rhinitis: is stable will continue zyrtec 10 mg daily   4. Constipation due to opioid therapy: is stable will continue colace twice daily miralax daily senna 2 tabs twice daily   5. UTI: is stable will complete keflex and will monitor   6. Spinal stenosis of lumbar region with radiculopathy: is status post redo of L4-5; L5-S1 laminectomies for decompression:  Has chronic back pain: will continue neurontin 100 mg three times daily oxycontin 15 mg every 12 hours; percocet 5/325 mg every 6 hours as needed; and zanaflex 4 mg three times daily as needed sues lidoderm 4% patch to back. Will give her prednisone 40 mg daily for 3 days. Will monitor her status.   She denies have gerd or IBS will stop the bentyl prilosec and zantac    MD is aware of resident's narcotic use and is in agreement with current plan of care. We will attempt to wean resident as apropriate   Synthia Innocent NP [facility]  Contact [phone] Monday through Friday 8am- 5pm  After hours call [phone]

## 2018-03-14 ENCOUNTER — Other Ambulatory Visit: Payer: Self-pay

## 2018-03-14 MED ORDER — OXYCODONE HCL ER 15 MG PO T12A: 15.0000 mg | EXTENDED_RELEASE_TABLET | Freq: Two times a day (BID) | ORAL | Status: DC

## 2018-03-14 NOTE — Telephone Encounter (Signed)
Rx sent to [facility] phone : [phone] , fax : [phone]  

## 2018-03-20 ENCOUNTER — Encounter: Payer: Self-pay | Admitting: Adult Health

## 2018-03-20 ENCOUNTER — Non-Acute Institutional Stay (SKILLED_NURSING_FACILITY): Payer: Managed Care, Other (non HMO) | Admitting: Adult Health

## 2018-03-20 DIAGNOSIS — J309 Allergic rhinitis, unspecified: Secondary | ICD-10-CM

## 2018-03-20 DIAGNOSIS — M5442 Lumbago with sciatica, left side: Secondary | ICD-10-CM

## 2018-03-20 DIAGNOSIS — M48061 Spinal stenosis, lumbar region without neurogenic claudication: Secondary | ICD-10-CM

## 2018-03-20 DIAGNOSIS — I10 Essential (primary) hypertension: Secondary | ICD-10-CM

## 2018-03-20 DIAGNOSIS — M5441 Lumbago with sciatica, right side: Secondary | ICD-10-CM

## 2018-03-20 DIAGNOSIS — G8929 Other chronic pain: Secondary | ICD-10-CM

## 2018-03-20 DIAGNOSIS — I82509 Chronic embolism and thrombosis of unspecified deep veins of unspecified lower extremity: Secondary | ICD-10-CM | POA: Diagnosis not present

## 2018-03-20 DIAGNOSIS — M5416 Radiculopathy, lumbar region: Secondary | ICD-10-CM

## 2018-03-20 NOTE — Progress Notes (Addendum)
[facility]:  [facility] Room Number: 215A Place of Service:  SNF ([phone]) Provider:  Kenard Gower, NP  Patient Care Team: Karna Dupes, MD as PCP - General (Family Medicine) Lenn Sink, DPM (Podiatry) Claudette Head, MD (Inactive) (Ophthalmology) Lunette Stands, MD (Orthopedic Surgery)  Extended Emergency Contact Information Primary Emergency Contact: Mariah Milling of [place] Phone: [phone] Relation: Daughter Secondary Emergency Contact: Chigubu,Steven Address: [address]          [place], [place] [address] Darden Amber of [place] Home Phone: [phone] Relation: Nephew  Code Status:  FULL  Goals of care: Advanced Directive information Advanced Directives 03/20/2018  Does Patient Have a Medical Advance Directive? No  Would patient like information on creating a medical advance directive? No - Patient declined     Chief Complaint  Patient presents with  . Medical Management of Chronic Issues    Weekly followup for the first 30 days Post Hospitalization    HPI:  Pt is a 62 y.o. female seen today for a weekly follow-up post hospitalization. She was seen in her room today. She said that her pain is well-controlled. She was recently transferred to the hospital due to acute right-sided low back pain with sciatica. She was given multiple doses of IV Dilaudid with minimal improvement in her pain. MRI of the lumbar spine spine showed post surgical changes with a subacute hematoma. ER physician discussed with neurosurgeon on-call who did her surgery and he did not recommend any further intervention but rather jus some supportive care with pain control and physical therapy. Of note, prior to being transferred to the hospital, she was having a short-term rehabilitation at [facility] S/P laminectomy for spinal stenosis with L5-S1 nerve root impingement .    Past Medical History:  Diagnosis Date  . Allergic  rhinitis   . Chronic low back pain   . History of blood transfusion   . History of chicken pox   . Hx of migraines   . Hyperlipidemia   . Hypertension   . Iritis, chronic   . Positive TB test   . Pulmonary embolism (HCC)   . Spinal stenosis of lumbar region with radiculopathy   . Thyroid disease    Past Surgical History:  Procedure Laterality Date  . ABDOMINAL HYSTERECTOMY  1999-2000  . BACK SURGERY  1984  . BREAST BIOPSY  1988, 2008  . CESAREAN SECTION    . LUMBAR LAMINECTOMY    . MYOMECTOMY  1988  . PLANTAR FASCIECTOMY  2013    Allergies  Allergen Reactions  . Xolair [Omalizumab] Shortness Of Breath  . Metaxalone Rash  . Ace Inhibitors Cough  . Nsaids Hives  . Sulfa Antibiotics Hives  . Clindamycin/Lincomycin Swelling and Rash  . Tramadol Swelling and Rash      Review of Systems  GENERAL: No change in appetite, no fatigue, no weight changes, no fever, chills or weakness MOUTH and THROAT: Denies oral discomfort, gingival pain or bleeding, pain from teeth or hoarseness   RESPIRATORY: no cough, SOB, DOE, wheezing, hemoptysis CARDIAC: No chest pain, or palpitations GI: No abdominal pain, diarrhea, constipation, heart burn, nausea or vomiting GU: Denies dysuria, frequency, hematuria, incontinence, or discharge PSYCHIATRIC: Denies feelings of depression or anxiety. No report of hallucinations, insomnia, paranoia, or agitation   Immunization History  Administered Date(s) Administered  . Influenza Split 05/23/2008, 05/23/2008, 04/11/2014, 04/22/2016  . Influenza, Seasonal, Injecte, Preservative Fre 04/11/2014, 04/09/2015, 04/22/2016, 04/06/2017  . Influenza-Unspecified 04/09/2015  .  Td 01/26/2010  . Tdap 06/21/2006   Pertinent  Health Maintenance Due  Topic Date Due  . COLONOSCOPY  07/05/2016  . INFLUENZA VACCINE  02/02/2018  . PAP SMEAR  05/18/2029 (Originally 07/05/2000)  . MAMMOGRAM  04/08/2018   Fall Risk  12/11/2015 09/05/2015 03/07/2015 01/03/2015  Falls in the  past year? No Yes Yes Yes  Number falls in past yr: - 1 1 1   Injury with Fall? - No Yes Yes  Comment - - Hurt her Left Knee -    Vitals:   03/20/18 1343  BP: 121/70  Pulse: 61  Resp: 18  Temp: 97.9 F (36.6 C)  TempSrc: Oral  SpO2: 96%  Weight: 267 lb 11.2 oz (121.4 kg)  Height: 5\' 5"  (1.651 m)   Body mass index is 44.55 kg/m.  Physical Exam  GENERAL APPEARANCE: Well nourished. In no acute distress. Morbidly obese SKIN:  Mid lower back incision is healed, dry and no redness MOUTH and THROAT: Lips are without lesions. Oral mucosa is moist and without lesions. Tongue is normal in shape, size, and color and without lesions RESPIRATORY: Breathing is even & unlabored, BS CTAB CARDIAC: RRR, no murmur,no extra heart sounds, BLE trace edema GI: Abdomen soft, normal BS, no masses, no tenderness EXTREMITIES:  Able to move X 4 extremities, ambulates with a walker NEUROLOGICAL: There is no tremor. Speech is clear PSYCHIATRIC: Alert and oriented X 3. Affect and behavior are appropriate  Labs reviewed: Recent Labs    02/03/18 0025 03/11/18 1319 03/12/18 0201  NA 136 138 137  K 3.9 5.2* 4.1  CL 97* 102 104  CO2 31 26 26   GLUCOSE 128* 130* 198*  BUN 14 12 13   CREATININE 0.90 0.91 0.85  CALCIUM 9.6 8.6* 8.5*   Recent Labs    02/03/18 0025  AST 21  ALT 14  ALKPHOS 69  BILITOT 0.5  PROT 7.5  ALBUMIN 4.1   Recent Labs    02/03/18 0025 03/11/18 1237 03/12/18 0201  WBC 8.3 9.0 11.6*  NEUTROABS  --  8.1*  --   HGB 14.2 12.4 12.0  HCT 41.7 37.4 35.2  MCV 80.6 82.0 81.2  PLT 239 325 292   Lab Results  Component Value Date   TSH 1.900 01/31/2015    Lab Results  Component Value Date   CHOL 251 (H) 01/31/2015   HDL 102 01/31/2015   LDLCALC 137 (H) 01/31/2015   LDLDIRECT 122.8 04/10/2012   TRIG 62 01/31/2015   CHOLHDL 2.5 01/31/2015    Significant Diagnostic Results in last 30 days:  Ct Angio Chest Pe W And/or Wo Contrast  Result Date: 03/11/2018 CLINICAL  DATA:  62 year old female with acute shortness of breath today. History of spine surgery 2 weeks ago. Prior history of DVT/pulmonary emboli. EXAM: CT ANGIOGRAPHY CHEST WITH CONTRAST TECHNIQUE: Multidetector CT imaging of the chest was performed using the standard protocol during bolus administration of intravenous contrast. Multiplanar CT image reconstructions and MIPs were obtained to evaluate the vascular anatomy. CONTRAST:  75mL OMNIPAQUE IOHEXOL 350 MG/ML SOLN COMPARISON:  03/11/2018 chest radiograph and 02/18/2017 CT FINDINGS: Cardiovascular: This is a technically borderline study with moderate respiratory motion artifact in the LOWER lungs. No definite pulmonary emboli are identified. UPPER limits normal heart size noted. No thoracic aortic aneurysm or pericardial effusion. Mediastinum/Nodes: No enlarged mediastinal, hilar, or axillary lymph nodes. Thyroid gland, trachea, and esophagus demonstrate no significant findings. Lungs/Pleura: Mild bibasilar atelectasis noted. There is no evidence of definite airspace disease or consolidation.  No suspicious nodule or mass noted. Upper Abdomen: No acute abnormality. Musculoskeletal: No chest wall abnormality. No acute or significant osseous findings. Review of the MIP images confirms the above findings. IMPRESSION: 1. No evidence of pulmonary emboli but evaluation is limited in the LOWER lungs due to respiratory motion artifact. 2. Mild bibasilar atelectasis. Electronically Signed   By: Harmon Pier M.D.   On: 03/11/2018 16:16   Mr Lumbar Spine W Wo Contrast  Result Date: 03/11/2018 CLINICAL DATA:  Worsening low back and right leg pain. Lumbar surgery 2 weeks ago. On Eliquis for DVT. EXAM: MRI LUMBAR SPINE WITHOUT AND WITH CONTRAST TECHNIQUE: Multiplanar and multiecho pulse sequences of the lumbar spine were obtained without and with intravenous contrast. CONTRAST:  20mL MULTIHANCE GADOBENATE DIMEGLUMINE 529 MG/ML IV SOLN COMPARISON:  CT abdomen and pelvis  02/03/2018. MRI sacrum 06/08/2012. FINDINGS: Segmentation:  Standard. Alignment:  Normal. Vertebrae: No fracture, suspicious osseous lesion, or significant marrow edema. Conus medullaris and cauda equina: Conus extends to the L1-2 level. Conus and cauda equina appear normal. Paraspinal and other soft tissues: Postoperative changes in the posterior lower lumbar soft tissues. Dorsal epidural fluid collection in the laminectomy bed extending from L4-S1 measures 3.4 x 3.1 x 7.0 cm and demonstrates intermediate T1 signal intensity with enhancement in the surrounding soft tissues. A separate fluid collection along the incision in the subcutaneous tissues measures 3.4 x 5.2 x 5.9 cm. Disc levels: L1-2 through L3-4: Normal discs. Mild facet arthrosis and ligamentum flavum hypertrophy without stenosis. L4-5: Disc desiccation. Prior posterior decompression. Enhancing epidural granulation tissue bilaterally. At most minimal disc bulging and mild facet arthrosis without stenosis. L5-S1: Prior posterior decompression with above described fluid collection extending into the lateral aspects of the spinal canal bilaterally and contacting the S1 nerve roots bilaterally without frank nerve root compression. Chronic complete effacement of the thecal sac from the L5 vertebral body level inferiorly due to extensive epidural fat. Minimal disc bulging without neural foraminal stenosis. IMPRESSION: 1. Postoperative changes in the lower lumbar spine including a 7 cm dorsal epidural fluid collection from L4-S1 favored to predominantly reflect subacute hematoma. Infection is not excluded by imaging. The fluid collection contacts the S1 nerve roots in the lateral aspects of the spinal canal without definite neural compression. 2. Mild diffuse lumbar facet arthrosis. Electronically Signed   By: Sebastian Ache M.D.   On: 03/11/2018 16:55   US Venous Img Lower Unilateral Right  Result Date: 03/11/2018 CLINICAL DATA:  Pain and swelling EXAM:  RIGHT LOWER EXTREMITY VENOUS DOPPLER ULTRASOUND TECHNIQUE: Gray-scale sonography with compression, as well as color and duplex ultrasound, were performed to evaluate the deep venous system from the level of the common femoral vein through the popliteal and proximal calf veins. COMPARISON:  None FINDINGS: Normal compressibility of the common femoral, superficial femoral, and popliteal veins, as well as the proximal calf veins. No filling defects to suggest DVT on grayscale or color Doppler imaging. Doppler waveforms show normal direction of venous flow, normal respiratory phasicity and response to augmentation. Survey views of the contralateral common femoral vein are unremarkable. IMPRESSION: No evidence of right lower extremity deep vein thrombosis. Electronically Signed   By: Corlis Leak M.D.   On: 03/11/2018 14:07   Dg Chest Port 1 View  Result Date: 03/11/2018 CLINICAL DATA:  Shortness of breath, right leg swelling, and back pain. History of pulmonary embolism. EXAM: PORTABLE CHEST 1 VIEW COMPARISON:  Chest radiographs and CTA 02/18/2017 FINDINGS: The cardiac silhouette is borderline enlarged. No  airspace consolidation, edema, sizable pleural effusion, or pneumothorax is identified. No acute osseous abnormality is seen. IMPRESSION: No active disease. Electronically Signed   By: Sebastian Ache M.D.   On: 03/11/2018 13:40    Assessment/Plan  1. Spinal stenosis of lumbar region with radiculopathy - S/P laminectomy, ontinue tizanidine4 mg 1 tab3 times a day when necessary for muscle spasm,OxyContin ER 12 hours 15 mg 1 tab every 12 hours or pain,PT and OT for therapeutic strengthening exercises, follow-up with neurosurgeon in 3 weeks   2. Chronic midline low back pain with bilateral sciatica - ,was given multiple doses of IV Dilaudid with minimal improvement in her pain. MRI of the lumbar spine spine showed post surgical changes with a subacute hematoma. ER physician discussed with neurosurgeon on-call who  did her surgery and he did not recommend any further intervention continue gabapentin 100 mg 1 capsule 3 times a day, OxyContin ER 12 hours 15 mg 1 tab every 12 hours or pain   3. 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 Chronic allergic rhinitis - continue cetirizine 10 mg 1 tab daily at bedtime   5. Benign hypertension - well-controlled, continue amlodipine 5 mg 1 tabdaily     Family/ staff Communication: Discussed plan of care with patient.  Labs/tests ordered:  None  Goals of care:   Short Term Care   Kenard Gower, NP [facility] [phone] (Monday-Friday 8:00 a.m. - 5:00 p.m.) [phone] (after hours)

## 2018-03-22 ENCOUNTER — Encounter: Payer: Self-pay | Admitting: Adult Health

## 2018-03-22 ENCOUNTER — Other Ambulatory Visit: Payer: Self-pay

## 2018-03-22 ENCOUNTER — Non-Acute Institutional Stay (SKILLED_NURSING_FACILITY): Payer: Managed Care, Other (non HMO) | Admitting: Adult Health

## 2018-03-22 DIAGNOSIS — M5416 Radiculopathy, lumbar region: Secondary | ICD-10-CM

## 2018-03-22 DIAGNOSIS — D6862 Lupus anticoagulant syndrome: Secondary | ICD-10-CM

## 2018-03-22 DIAGNOSIS — M48061 Spinal stenosis, lumbar region without neurogenic claudication: Secondary | ICD-10-CM

## 2018-03-22 NOTE — Progress Notes (Signed)
Location:   [facility] Room Number: [id number] Place of Service:  SNF (31)    CODE STATUS: FULL  Allergies  Allergen Reactions  . Xolair [Omalizumab] Shortness Of Breath  . Metaxalone Rash  . Ace Inhibitors Cough  . Nsaids Hives  . Sulfa Antibiotics Hives  . Clindamycin/Lincomycin Swelling and Rash  . Tramadol Swelling and Rash    Chief Complaint  Patient presents with  . Discharge Note    Discharging from SNF on 03/24/18    HPI:  She is being discharged to home with home health for pt/ot. She will need a bsc; her prescriptions written and will need to follow up with her medical provider. She had been hospitalized for laminectomies for her lumbar spine. She had been admitted to this facility for short term rehab and is now ready for discharge to home.    Past Medical History:  Diagnosis Date  . Allergic rhinitis   . Chronic low back pain   . History of blood transfusion   . History of chicken pox   . Hx of migraines   . Hyperlipidemia   . Hypertension   . Iritis, chronic   . Positive TB test   . Pulmonary embolism (HCC)   . Spinal stenosis of lumbar region with radiculopathy   . Thyroid disease     Past Surgical History:  Procedure Laterality Date  . ABDOMINAL HYSTERECTOMY  1999-2000  . BACK SURGERY  1984  . BREAST BIOPSY  1988, 2008  . CESAREAN SECTION    . LUMBAR LAMINECTOMY    . MYOMECTOMY  1988  . PLANTAR FASCIECTOMY  2013    Social History   Socioeconomic History  . Marital status: Widowed    Spouse name: Not on file  . Number of children: 1  . Years of education: Not on file  . Highest education level: Not on file  Occupational History  . Occupation: Physicist, medicalChaplain    Employer: [employer]  . Occupation: Physicist, medicalChaplain    Employer: [employer]  Social Needs  . Financial resource strain: Not on file  . Food insecurity:    Worry: Not on file    Inability: Not on file  . Transportation needs:    Medical: Not on file   Non-medical: Not on file  Tobacco Use  . Smoking status: Never Smoker  . Smokeless tobacco: Never Used  Substance and Sexual Activity  . Alcohol use: Yes    Alcohol/week: 0.0 standard drinks    Comment: occasional  . Drug use: No  . Sexual activity: Never  Lifestyle  . Physical activity:    Days per week: Not on file    Minutes per session: Not on file  . Stress: Not on file  Relationships  . Social connections:    Talks on phone: Not on file    Gets together: Not on file    Attends religious service: Not on file    Active member of club or organization: Not on file    Attends meetings of clubs or organizations: Not on file    Relationship status: Not on file  . Intimate partner violence:    Fear of current or ex partner: Not on file    Emotionally abused: Not on file    Physically abused: Not on file    Forced sexual activity: Not on file  Other Topics Concern  . Not on file  Social History Narrative   Regular exercise-no   Caffeine Use-yes  Family History  Problem Relation Age of Onset  . Hypertension Father   . Early death Mother   . Thyroid cancer Sister   . Deep vein thrombosis Sister   . Parkinson's disease Brother   . Prostate cancer Brother     VITAL SIGNS BP (!) 99/58   Pulse 66   Temp 98.6 F (37 C) (Oral)   Resp 18   Ht 5\' 5"  (1.651 m)   Wt 265 lb 9.6 oz (120.5 kg)   SpO2 100%   BMI 44.20 kg/m   Patient's Medications  New Prescriptions   No medications on file  Previous Medications   ACETAMINOPHEN (TYLENOL) 325 MG TABLET    Take 650 mg by mouth every 4 (four) hours as needed for mild pain, moderate pain or fever. for pain/ increased temp. May be administered orally, per G-tube if needed or rectally if unable to swallow (separate order). Maximum dose for 24 hours is 3,000 mg from all sources of Acetaminophen/ Tylenol   ACIDOPHILUS (RISAQUAD) CAPS CAPSULE    Take 1 capsule by mouth daily.   AMLODIPINE (NORVASC) 5 MG TABLET    Take 5 mg by mouth  daily.    APIXABAN (ELIQUIS) 5 MG TABS TABLET    Take 5 mg by mouth 2 (two) times daily.    CARBOXYMETHYLCELLULOSE (REFRESH PLUS) 0.5 % SOLN    Place 1 drop into both eyes daily.   CETIRIZINE (ZYRTEC) 10 MG TABLET    Take 10 mg by mouth at bedtime.    DOCUSATE SODIUM (COLACE) 100 MG CAPSULE    Take 100 mg by mouth 2 (two) times daily.    FUROSEMIDE (LASIX) 20 MG TABLET    Take 40 mg by mouth daily as needed for edema.    GABAPENTIN (NEURONTIN) 100 MG CAPSULE    Take 100 mg by mouth 3 (three) times daily.    HYDROXYZINE (ATARAX/VISTARIL) 25 MG TABLET    Take 1 tablet (25 mg total) by mouth 3 (three) times daily as needed.   LIDOCAINE (ASPERCREME LIDOCAINE) 4 % PTCH    Apply 1 patch topically every 12 (twelve) hours. Remove patch after worn for 12 hours   NON FORMULARY    Diet Type: Regular   OXYCODONE (OXYCONTIN) 15 MG 12 HR TABLET    Take 1 tablet (15 mg total) by mouth every 12 (twelve) hours.   POLYETHYLENE GLYCOL (MIRALAX / GLYCOLAX) PACKET    Take 17 g by mouth daily. mix with 6-8 oz water for constipation   POTASSIUM CHLORIDE SA (K-DUR,KLOR-CON) 20 MEQ TABLET    Take 20 mEq by mouth daily as needed. take only with Lasix   PREDNISOLONE ACETATE (PRED MILD) 0.12 % OPHTHALMIC SUSPENSION    Place 1 drop into both eyes daily as needed (irratation).    SENNA (SENOKOT) 8.6 MG TABS TABLET    Take 2 tablets by mouth 2 (two) times daily.   TIZANIDINE (ZANAFLEX) 4 MG TABLET    Take 4 mg by mouth 3 (three) times daily as needed for muscle spasms.   TRIAMCINOLONE CREAM (KENALOG) 0.1 %    Apply 1 application topically 2 (two) times daily as needed (rash).  Modified Medications   No medications on file  Discontinued Medications   RANITIDINE (ZANTAC) 150 MG TABLET    Take 150 mg by mouth 2 (two) times daily.     SIGNIFICANT DIAGNOSTIC EXAMS  LABS REVIEWED: PREVIOUS  03-12-18: wbc 11.6; hgb 12.0; hct 35.2; mcv 81.2; plt 292; glucose 198;  bun 13; creat 0.85; k+ 4.1; na++ 137; ca 8.5  NO NEW LABS.     Review of Systems  Constitutional: Negative for malaise/fatigue.  Respiratory: Negative for cough and shortness of breath.   Cardiovascular: Negative for chest pain, palpitations and leg swelling.  Gastrointestinal: Negative for abdominal pain, constipation and heartburn.  Musculoskeletal: Negative for back pain, joint pain and myalgias.  Skin: Negative.   Neurological: Negative for dizziness.  Psychiatric/Behavioral: The patient is not nervous/anxious.     Physical Exam  Constitutional: She is oriented to person, place, and time. She appears well-developed and well-nourished. No distress.  obese  Neck: No thyromegaly present.  Cardiovascular: Normal rate, regular rhythm, normal heart sounds and intact distal pulses.  Pulmonary/Chest: Effort normal and breath sounds normal. No respiratory distress.  Abdominal: Soft. Bowel sounds are normal. She exhibits no distension. There is no tenderness.  Musculoskeletal: Normal range of motion. She exhibits no edema.  Using walker.   Lymphadenopathy:    She has no cervical adenopathy.  Neurological: She is alert and oriented to person, place, and time.  Skin: Skin is warm and dry. She is not diaphoretic.  Back incision lines healed   Psychiatric: She has a normal mood and affect.    ASSESSMENT/ PLAN:  Patient is being discharged with the following home health services:  Pt/ot: to evaluate and treat as indicated for gait balance strength adl training  Patient is being discharged with the following durable medical equipment:  [facility]  Patient has been advised to f/u with their PCP in 1-2 weeks to bring them up to date on their rehab stay.  Social services at facility was responsible for arranging this appointment.  Pt was provided with a 30 day supply of prescriptions for medications and refills must be obtained from their PCP.  For controlled substances, a more limited supply may be provided adequate until PCP appointment only.  A 30 day  supply of prescription medications have been written as listed above #30 oxycontin 15 mg tabs.   Time spent with patient: 35 minutes: discussed medications; home health needs and goals; verbalized understanding.     Synthia Innocent NP [facility]  Contact [phone] Monday through Friday 8am- 5pm  After hours call [phone]

## 2018-03-22 NOTE — Telephone Encounter (Signed)
Patient will receive prescription refill for Oxycodone 12 hr 15 mg tab upon discharge on 03/24/18. Prescription written for oxycodone 12 hr 15 mg tab - 1 tab po q12h prn for pain. 30 tab total

## 2018-07-11 NOTE — Unmapped (Signed)
Husband requests a new Rx for a handicap placard.    Requests to have the script mailed to their home.    Address on file verified.

## 2018-07-12 NOTE — Unmapped (Signed)
Please do handicap placard rx, place in my folder and I will sign. Then ask if patient's husband would like the rx mailed or if he wants to pick up.

## 2018-07-13 NOTE — Unmapped (Signed)
Handicap placard letter given to Dr Gerilyn Pilgrim to sign.

## 2018-07-13 NOTE — Unmapped (Signed)
Spoke with [name] pt spouse advised handicap letter ready to p/u spouse will p/u at [facility] front desk.

## 2018-10-22 IMAGING — MR MR LUMBAR SPINE WO/W CM
6 of 7 series · 33 of 48 positions shown · IV contrast (multihance)
Comparison: CT abdomen and pelvis 02/03/2018. MRI sacrum
06/08/2012.

CLINICAL DATA: Worsening low back and right leg pain. Lumbar
surgery 2 weeks ago. On Eliquis for DVT.

EXAM:
MRI LUMBAR SPINE WITHOUT AND WITH CONTRAST
TECHNIQUE: Multiplanar and multiecho pulse sequences of the lumbar spine were
obtained without and with intravenous contrast.
CONTRAST:  20mL MULTIHANCE GADOBENATE DIMEGLUMINE 529 MG/ML IV SOLN

[Series 5: T2 · sagittal · 4.0mm · 1.09mm/px · 5 of 15 slices shown (1 of 2)]
[im 1/15]
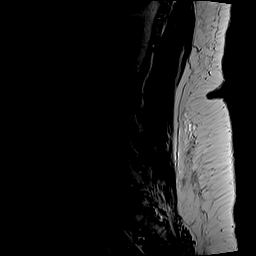
[im 4/15]
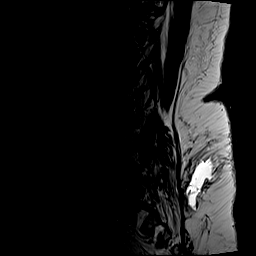
[im 8/15]
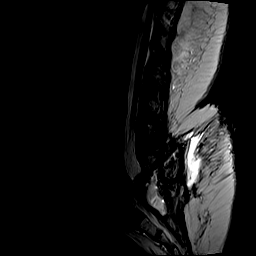
[im 11/15]
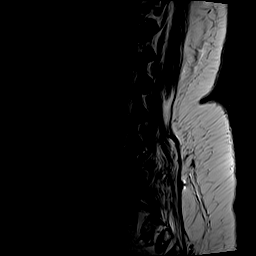
[im 15/15]
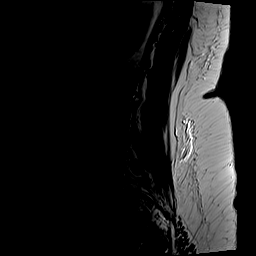

[Series 6: T1 · sagittal · 4.0mm · 1.09mm/px · 5 of 15 slices shown (1 of 2)]
[im 1/15]
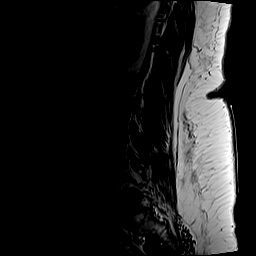
[im 4/15]
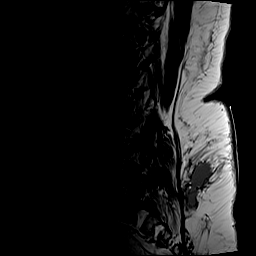
[im 8/15]
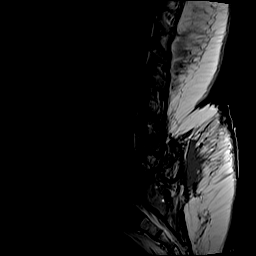
[im 11/15]
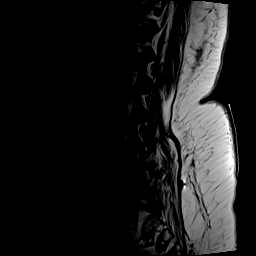
[im 15/15]
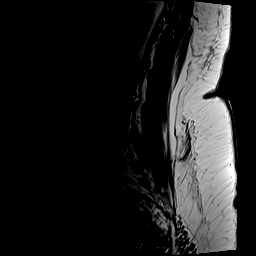

[Series 7: STIR · sagittal · 4.0mm · 0.55mm/px · 3 of 15 slices shown]
[im 1/15]
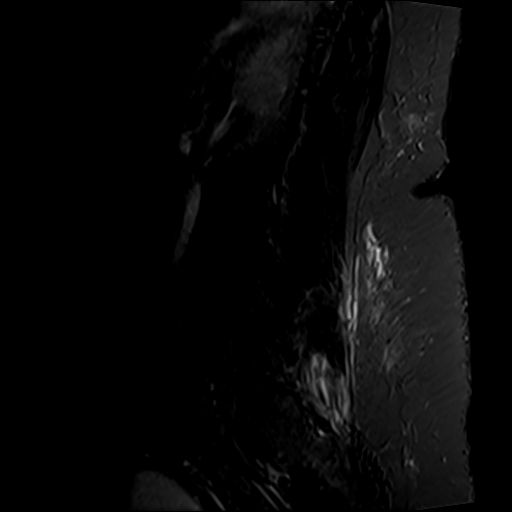
[im 5/15]
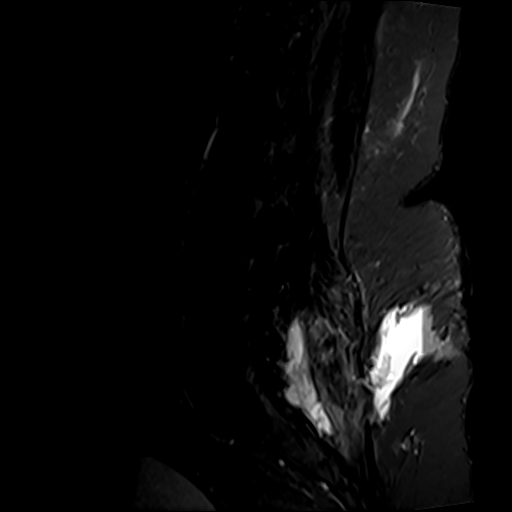
[im 10/15]
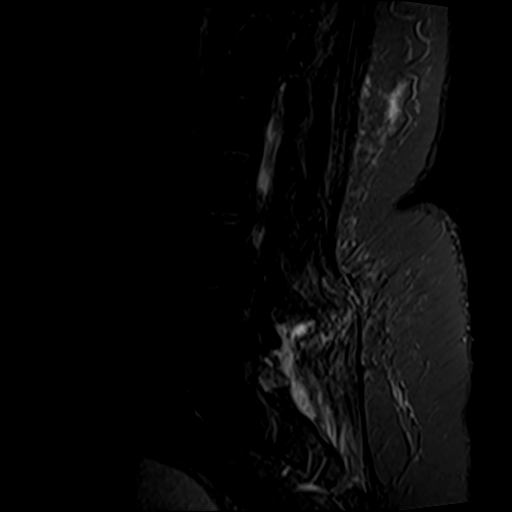

[Series 8: T2 · axial · 4.0mm · 0.78mm/px · z∈[-130,+68]mm · 8 of 36 slices shown (2 of 2)]
[im 1/36]
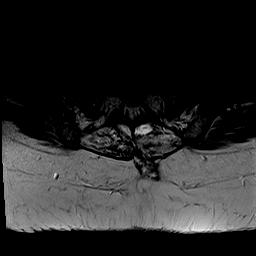
[im 4/36]
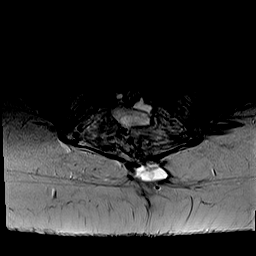
[im 12/36]
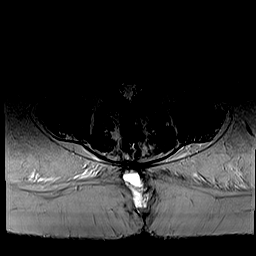
[im 16/36]
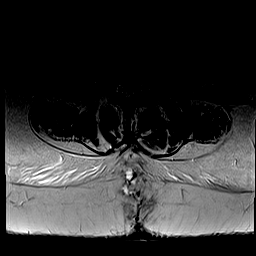
[im 20/36]
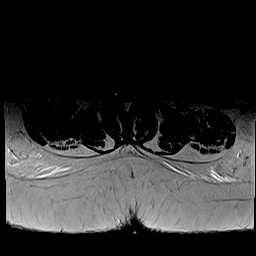
[im 24/36]
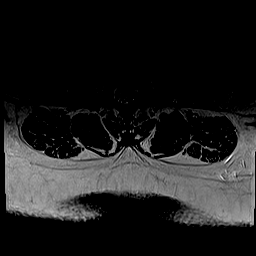
[im 32/36]
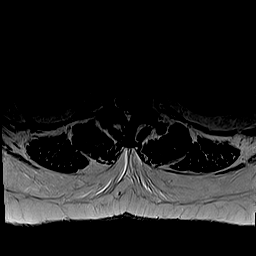
[im 36/36]
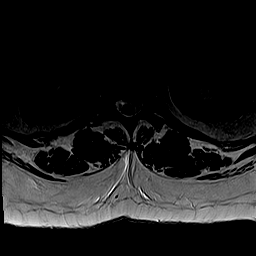

[Series 9: T1 · axial · 4.0mm · 0.39mm/px · z∈[-130,+68]mm · 8 of 36 slices shown (2 of 2)]
[im 1/36]
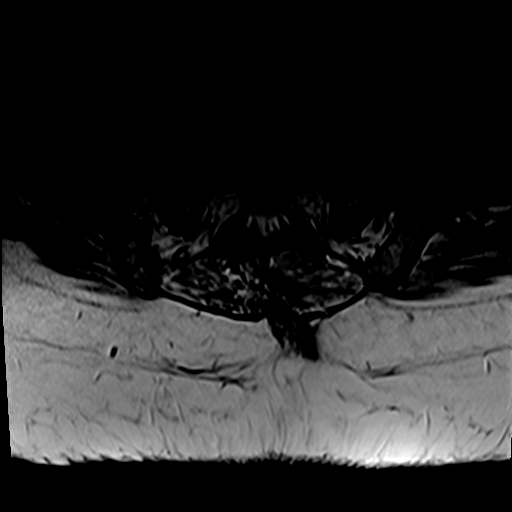
[im 4/36]
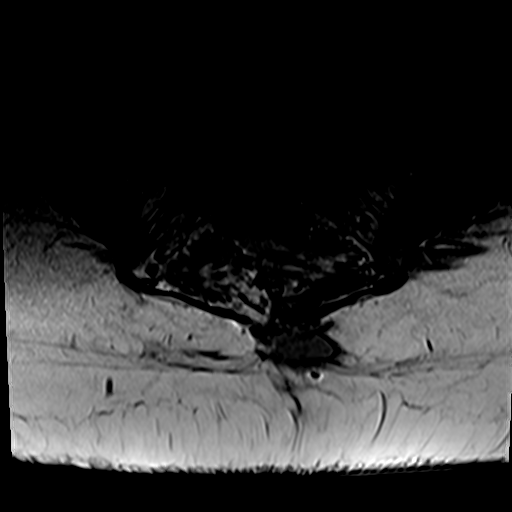
[im 12/36]
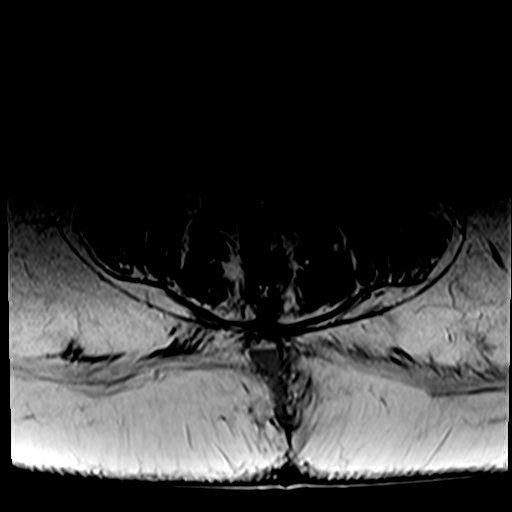
[im 16/36]
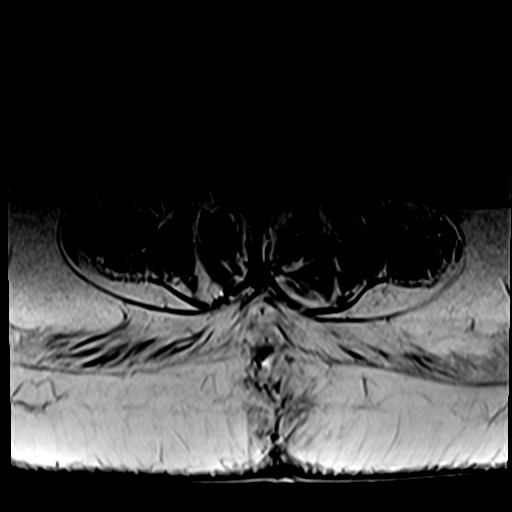
[im 20/36]
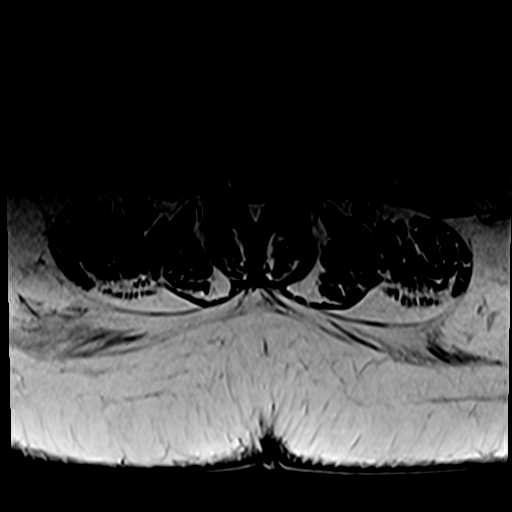
[im 24/36]
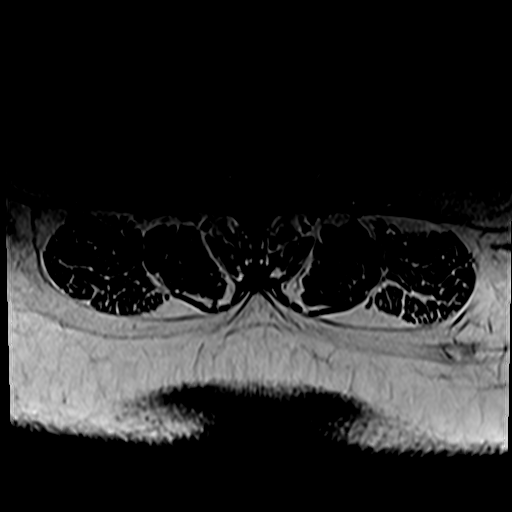
[im 32/36]
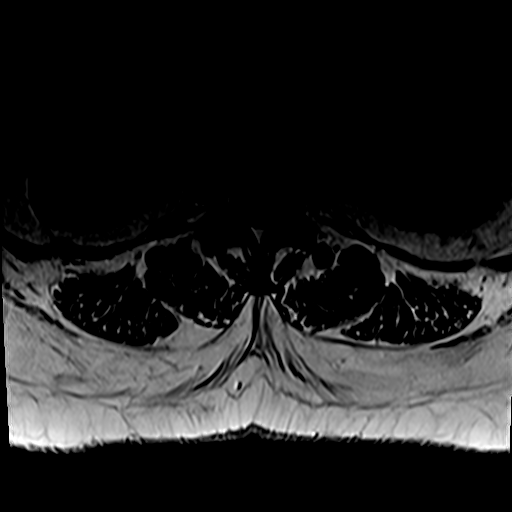
[im 36/36]
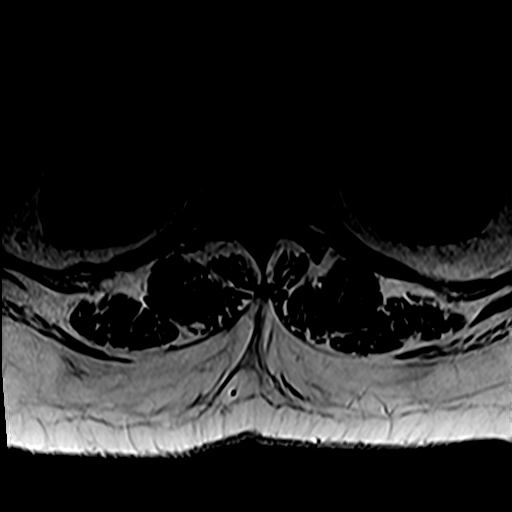

[Series 10: T1 fat-sat post-contrast · sagittal · 4.0mm · 1.09mm/px · 4 of 15 slices shown]
[im 1/15]
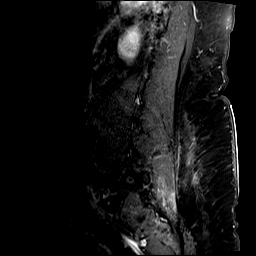
[im 5/15]
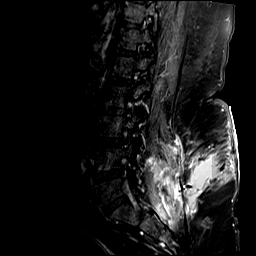
[im 10/15]
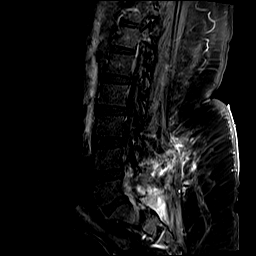
[im 15/15]
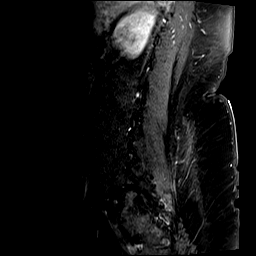

[33 of 48 positions shown; findings below may reference images not displayed]

FINDINGS: Segmentation:  Standard.

Alignment:  Normal.

Vertebrae: No fracture, suspicious osseous lesion, or significant
marrow edema.

Conus medullaris and cauda equina: Conus extends to the L1-2 level.
Conus and cauda equina appear normal.

Paraspinal and other soft tissues: Postoperative changes in the
posterior lower lumbar soft tissues. Dorsal epidural fluid
collection in the laminectomy bed extending from L4-S1 measures
x 3.1 x 7.0 cm and demonstrates intermediate T1 signal intensity
with enhancement in the surrounding soft tissues. A separate fluid
collection along the incision in the subcutaneous tissues measures
3.4 x 5.2 x 5.9 cm.

Disc levels:

L1-2 through L3-4: Normal discs. Mild facet arthrosis and ligamentum
flavum hypertrophy without stenosis.

L4-5: Disc desiccation. Prior posterior decompression. Enhancing
epidural granulation tissue bilaterally. At most minimal disc
bulging and mild facet arthrosis without stenosis.

L5-S1: Prior posterior decompression with above described fluid
collection extending into the lateral aspects of the spinal canal
bilaterally and contacting the S1 nerve roots bilaterally without
frank nerve root compression. Chronic complete effacement of the
thecal sac from the L5 vertebral body level inferiorly due to
extensive epidural fat. Minimal disc bulging without neural
foraminal stenosis.
IMPRESSION: 1. Postoperative changes in the lower lumbar spine including a 7 cm
dorsal epidural fluid collection from L4-S1 favored to predominantly
reflect subacute hematoma. Infection is not excluded by imaging. The
fluid collection contacts the S1 nerve roots in the lateral aspects
of the spinal canal without definite neural compression.
2. Mild diffuse lumbar facet arthrosis.

## 2019-02-07 MED ORDER — morphine 100 mg/5 mL (20 mg/mL) concentrated solution
100 | ORAL | 0 refills | Status: AC
Start: 2019-02-07 — End: 2019-02-10

## 2019-03-06 DEATH — deceased

## 2019-04-18 ENCOUNTER — Encounter: Payer: Self-pay | Admitting: *Deleted

## 2019-04-18 ENCOUNTER — Other Ambulatory Visit: Payer: Self-pay

## 2019-04-18 NOTE — Anesthesia Preprocedure Evaluation (Addendum)
Anesthesia Evaluation  Patient identified by MRN, date of birth, ID band Patient awake    Reviewed: Allergy & Precautions, NPO status , Patient's Chart, lab work & pertinent test results  History of Anesthesia Complications Negative for: history of anesthetic complications  Airway Mallampati: III  TM Distance: >3 FB Neck ROM: Full    Dental   Pulmonary    breath sounds clear to auscultation       Cardiovascular hypertension, (-) angina(-) DOE  Rhythm:Regular Rate:Normal   HLD  TTE 10/04/2017: EF 65-70% Mild aortic sclerosis, good valve opening Trace MR 1+TR  Cardiolite 10/04/2017: Post EF 81% No inducible ischemia  Holter monitor 10/17/2017: Few premature beats   Neuro/Psych  Headaches,  Neuromuscular disease (Lumbar spinal stenosis)    GI/Hepatic neg GERD  ,  Endo/Other    Renal/GU      Musculoskeletal  (+) Arthritis ,   Abdominal (+) + obese (BMI 42),   Peds  Hematology  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 Lupus anticoagulant   Anesthesia Other Findings   Reproductive/Obstetrics                          Anesthesia Physical Anesthesia Plan  ASA: III  Anesthesia Plan: MAC   Post-op Pain Management:    Induction: Intravenous  PONV Risk Score and Plan: 2 and TIVA and Midazolam  Airway Management Planned: Nasal Cannula  Additional Equipment:   Intra-op Plan:   Post-operative Plan:   Informed Consent: I have reviewed the patients History and Physical, chart, labs and discussed the procedure including the risks, benefits and alternatives for the proposed anesthesia with the patient or authorized representative who has indicated his/her understanding and acceptance.       Plan Discussed with: CRNA and Anesthesiologist  Anesthesia Plan Comments:         Anesthesia Quick Evaluation

## 2019-04-20 ENCOUNTER — Other Ambulatory Visit
Admission: RE | Admit: 2019-04-20 | Discharge: 2019-04-20 | Disposition: A | Discharge status: Home / Self Care | Payer: Managed Care, Other (non HMO) | Source: Ambulatory Visit | Attending: Ophthalmology | Admitting: Ophthalmology

## 2019-04-20 DIAGNOSIS — Z20828 Contact with and (suspected) exposure to other viral communicable diseases: Secondary | ICD-10-CM | POA: Diagnosis present

## 2019-04-20 LAB — SARS CORONAVIRUS 2 (TAT 6-24 HRS): SARS Coronavirus 2: NEGATIVE

## 2019-04-23 NOTE — Discharge Instructions (Signed)

## 2019-04-25 ENCOUNTER — Encounter
Admission: RE | Disposition: A | Discharge status: Home / Self Care | Payer: Self-pay | Source: Home / Self Care | Attending: Ophthalmology

## 2019-04-25 ENCOUNTER — Ambulatory Visit: Payer: Managed Care, Other (non HMO) | Admitting: Anesthesiology

## 2019-04-25 ENCOUNTER — Ambulatory Visit
Admission: RE | Admit: 2019-04-25 | Discharge: 2019-04-25 | Disposition: A | Discharge status: Home / Self Care | Payer: Managed Care, Other (non HMO) | Attending: Ophthalmology | Admitting: Ophthalmology

## 2019-04-25 ENCOUNTER — Other Ambulatory Visit: Payer: Self-pay

## 2019-04-25 DIAGNOSIS — I10 Essential (primary) hypertension: Secondary | ICD-10-CM | POA: Insufficient documentation

## 2019-04-25 DIAGNOSIS — Z86718 Personal history of other venous thrombosis and embolism: Secondary | ICD-10-CM | POA: Diagnosis not present

## 2019-04-25 DIAGNOSIS — Z79899 Other long term (current) drug therapy: Secondary | ICD-10-CM | POA: Insufficient documentation

## 2019-04-25 DIAGNOSIS — Z7901 Long term (current) use of anticoagulants: Secondary | ICD-10-CM | POA: Diagnosis not present

## 2019-04-25 DIAGNOSIS — Z86711 Personal history of pulmonary embolism: Secondary | ICD-10-CM | POA: Insufficient documentation

## 2019-04-25 DIAGNOSIS — Z79891 Long term (current) use of opiate analgesic: Secondary | ICD-10-CM | POA: Diagnosis not present

## 2019-04-25 DIAGNOSIS — Z885 Allergy status to narcotic agent status: Secondary | ICD-10-CM | POA: Diagnosis not present

## 2019-04-25 DIAGNOSIS — H2511 Age-related nuclear cataract, right eye: Secondary | ICD-10-CM | POA: Diagnosis present

## 2019-04-25 DIAGNOSIS — Z886 Allergy status to analgesic agent status: Secondary | ICD-10-CM | POA: Diagnosis not present

## 2019-04-25 DIAGNOSIS — M1990 Unspecified osteoarthritis, unspecified site: Secondary | ICD-10-CM | POA: Insufficient documentation

## 2019-04-25 DIAGNOSIS — Z881 Allergy status to other antibiotic agents status: Secondary | ICD-10-CM | POA: Insufficient documentation

## 2019-04-25 DIAGNOSIS — Z882 Allergy status to sulfonamides status: Secondary | ICD-10-CM | POA: Diagnosis not present

## 2019-04-25 HISTORY — PX: CATARACT EXTRACTION W/PHACO: SHX586

## 2019-04-25 HISTORY — DX: Unspecified osteoarthritis, unspecified site: M19.90

## 2019-04-25 HISTORY — DX: Other complications of anesthesia, initial encounter: T88.59XA

## 2019-04-25 HISTORY — DX: Acute embolism and thrombosis of unspecified deep veins of unspecified lower extremity: I82.409

## 2019-04-25 HISTORY — DX: Family history of other specified conditions: Z84.89

## 2019-04-25 SURGERY — PHACOEMULSIFICATION, CATARACT, WITH IOL INSERTION
Anesthesia: Monitor Anesthesia Care | Site: Eye | Laterality: Right

## 2019-04-25 MED ORDER — LIDOCAINE HCL (PF) 2 % IJ SOLN
INTRAOCULAR | Status: DC | PRN
  Administered 2019-04-25: 1 mL

## 2019-04-25 MED ORDER — TETRACAINE HCL 0.5 % OP SOLN
1.0000 [drp] | OPHTHALMIC | Status: DC | PRN
  Administered 2019-04-25 (×3): 1 [drp] via OPHTHALMIC

## 2019-04-25 MED ORDER — EPINEPHRINE PF 1 MG/ML IJ SOLN
INTRAOCULAR | Status: DC | PRN
  Administered 2019-04-25: 08:00:00 69 mL via OPHTHALMIC

## 2019-04-25 MED ORDER — POLYMYXIN B-TRIMETHOPRIM 10000-0.1 UNIT/ML-% OP SOLN: 1.0000 [drp] | OPHTHALMIC | Status: DC

## 2019-04-25 MED ORDER — NA HYALUR & NA CHOND-NA HYALUR 0.4-0.35 ML IO KIT
PACK | INTRAOCULAR | Status: DC | PRN
  Administered 2019-04-25: 1 mL via INTRAOCULAR

## 2019-04-25 MED ORDER — CEFUROXIME OPHTHALMIC INJECTION 1 MG/0.1 ML
INJECTION | OPHTHALMIC | Status: DC | PRN
  Administered 2019-04-25: 0.1 mL via INTRACAMERAL

## 2019-04-25 MED ORDER — LACTATED RINGERS IV SOLN: 100.0000 mL/h | INTRAVENOUS | Status: DC

## 2019-04-25 MED ORDER — MIDAZOLAM HCL 2 MG/2ML IJ SOLN
INTRAMUSCULAR | Status: DC | PRN
  Administered 2019-04-25: 2 mg via INTRAVENOUS

## 2019-04-25 MED ORDER — CYCLOPENTOLATE HCL 2 % OP SOLN
1.0000 [drp] | OPHTHALMIC | Status: DC | PRN
  Administered 2019-04-25 (×3): 1 [drp] via OPHTHALMIC

## 2019-04-25 MED ORDER — PHENYLEPHRINE HCL 10 % OP SOLN
1.0000 [drp] | OPHTHALMIC | Status: DC | PRN
  Administered 2019-04-25 (×3): 1 [drp] via OPHTHALMIC

## 2019-04-25 MED ORDER — POLYMYXIN B-TRIMETHOPRIM 10000-0.1 UNIT/ML-% OP SOLN
1.0000 [drp] | OPHTHALMIC | Status: AC
  Administered 2019-04-25 (×3): 1 [drp] via OPHTHALMIC

## 2019-04-25 MED ORDER — BRIMONIDINE TARTRATE-TIMOLOL 0.2-0.5 % OP SOLN
OPHTHALMIC | Status: DC | PRN
  Administered 2019-04-25: 1 [drp] via OPHTHALMIC

## 2019-04-25 MED ORDER — FENTANYL CITRATE (PF) 100 MCG/2ML IJ SOLN
INTRAMUSCULAR | Status: DC | PRN
  Administered 2019-04-25: 100 ug via INTRAVENOUS
  Administered 2019-04-25: 50 ug via INTRAVENOUS

## 2019-04-25 SURGICAL SUPPLY — 16 items
CANNULA ANT/CHMB 27G (MISCELLANEOUS) ×1 IMPLANT
CANNULA ANT/CHMB 27GA (MISCELLANEOUS) ×2 IMPLANT
GLOVE SURG LX 7.5 STRW (GLOVE) ×1
GLOVE SURG LX STRL 7.5 STRW (GLOVE) ×1 IMPLANT
GLOVE SURG TRIUMPH 8.0 PF LTX (GLOVE) ×2 IMPLANT
GOWN STRL REUS W/ TWL LRG LVL3 (GOWN DISPOSABLE) ×2 IMPLANT
GOWN STRL REUS W/TWL LRG LVL3 (GOWN DISPOSABLE) ×2
LENS IOL TECNIS ITEC 21.0 (Intraocular Lens) ×1 IMPLANT
MARKER SKIN DUAL TIP RULER LAB (MISCELLANEOUS) ×2 IMPLANT
PACK CATARACT BRASINGTON (MISCELLANEOUS) ×2 IMPLANT
PACK EYE AFTER SURG (MISCELLANEOUS) ×2 IMPLANT
PACK OPTHALMIC (MISCELLANEOUS) ×2 IMPLANT
SYR 3ML LL SCALE MARK (SYRINGE) ×2 IMPLANT
SYR TB 1ML LUER SLIP (SYRINGE) ×2 IMPLANT
WATER STERILE IRR 500ML POUR (IV SOLUTION) ×2 IMPLANT
WIPE NON LINTING 3.25X3.25 (MISCELLANEOUS) ×2 IMPLANT

## 2019-04-25 NOTE — H&P (Signed)

## 2019-04-25 NOTE — Transfer of Care (Signed)
Immediate Anesthesia Transfer of Care Note  Patient: Chelsea Werner  Procedure(s) Performed: CATARACT EXTRACTION PHACO AND INTRAOCULAR LENS PLACEMENT (IOC) RIGHT  00:51.5  12.5%  6.44 (Right Eye)  Patient Location: PACU  Anesthesia Type: MAC  Level of Consciousness: awake, alert  and patient cooperative  Airway and Oxygen Therapy: Patient Spontanous Breathing and Patient connected to supplemental oxygen  Post-op Assessment: Post-op Vital signs reviewed, Patient's Cardiovascular Status Stable, Respiratory Function Stable, Patent Airway and No signs of Nausea or vomiting  Post-op Vital Signs: Reviewed and stable  Complications: No apparent anesthesia complications

## 2019-04-25 NOTE — Anesthesia Postprocedure Evaluation (Signed)
Anesthesia Post Note  Patient: Chelsea Werner  Procedure(s) Performed: CATARACT EXTRACTION PHACO AND INTRAOCULAR LENS PLACEMENT (IOC) RIGHT  00:51.5  12.5%  6.44 (Right Eye)  Patient location during evaluation: PACU Anesthesia Type: MAC Level of consciousness: awake and alert Pain management: pain level controlled Vital Signs Assessment: post-procedure vital signs reviewed and stable Respiratory status: spontaneous breathing, nonlabored ventilation, respiratory function stable and patient connected to nasal cannula oxygen Cardiovascular status: stable and blood pressure returned to baseline Postop Assessment: no apparent nausea or vomiting Anesthetic complications: no    Yosiel Thieme A  Czar Ysaguirre

## 2019-04-25 NOTE — Anesthesia Procedure Notes (Signed)
Procedure Name: MAC Performed by: Izetta Dakin, CRNA Pre-anesthesia Checklist: Timeout performed, Patient being monitored, Suction available, Emergency Drugs available and Patient identified Patient Re-evaluated:Patient Re-evaluated prior to induction Oxygen Delivery Method: Nasal cannula

## 2019-04-25 NOTE — Op Note (Signed)
LOCATION:  [facility]   PREOPERATIVE DIAGNOSIS:    Nuclear sclerotic cataract right eye. H25.11   POSTOPERATIVE DIAGNOSIS:  Nuclear sclerotic cataract right eye.     PROCEDURE:  Phacoemusification with posterior chamber intraocular lens placement of the right eye   ULTRASOUND TIME: Procedure(s): CATARACT EXTRACTION PHACO AND INTRAOCULAR LENS PLACEMENT (IOC) RIGHT  00:51.5  12.5%  6.44 (Right)  LENS:   Implant Name Type Inv. Item Serial No. Manufacturer Lot No. LRB No. Used Action  LENS IOL DIOP 21.0 - [id number] Intraocular Lens LENS IOL DIOP 21.0 [id number] AMO  Right 1 Implanted         SURGEON:  Wyonia Hough, MD   ANESTHESIA:  Topical with tetracaine drops and 2% Xylocaine jelly, augmented with 1% preservative-free intracameral lidocaine.    COMPLICATIONS:  None.   DESCRIPTION OF PROCEDURE:  The patient was identified in the holding room and transported to the operating room and placed in the supine position under the operating microscope.  The right eye was identified as the operative eye and it was prepped and draped in the usual sterile ophthalmic fashion.   A 1 millimeter clear-corneal paracentesis was made at the 12:00 position.  0.5 ml of preservative-free 1% lidocaine was injected into the anterior chamber. The anterior chamber was filled with Viscoat viscoelastic.  A 2.4 millimeter keratome was used to make a near-clear corneal incision at the 9:00 position.  A curvilinear capsulorrhexis was made with a cystotome and capsulorrhexis forceps.  Balanced salt solution was used to hydrodissect and hydrodelineate the nucleus.   Phacoemulsification was then used in stop and chop fashion to remove the lens nucleus and epinucleus.  The remaining cortex was then removed using the irrigation and aspiration handpiece. Provisc was then placed into the capsular bag to distend it for lens placement.  A lens was then injected into the capsular bag.  The remaining  viscoelastic was aspirated.   Wounds were hydrated with balanced salt solution.  The anterior chamber was inflated to a physiologic pressure with balanced salt solution.  No wound leaks were noted. Cefuroxime 0.1 ml of a 10mg /ml solution was injected into the anterior chamber for a dose of 1 mg of intracameral antibiotic at the completion of the case.   Timolol and Brimonidine drops were applied to the eye.  The patient was taken to the recovery room in stable condition without complications of anesthesia or surgery.   Sajad Glander 04/25/2019, 8:26 AM

## 2019-04-26 ENCOUNTER — Encounter: Payer: Self-pay | Admitting: Ophthalmology

## 2019-11-09 ENCOUNTER — Ambulatory Visit: Payer: Managed Care, Other (non HMO) | Admitting: Physical Therapy

## 2020-03-05 ENCOUNTER — Ambulatory Visit: Payer: Managed Care, Other (non HMO) | Attending: Family Medicine

## 2020-08-04 NOTE — Telephone Encounter (Signed)
Patient was previously seen by Dr. Jeanelle Malling.  Has not been seen since 05/14/2016.  Due to length of time since appointment.  Patient is no longer a patient at [facility].  [facility] not taking new patients at this time.

## 2021-04-06 ENCOUNTER — Ambulatory Visit: Admission: EM | Admit: 2021-04-06 | Discharge: 2021-04-06 | Payer: 59

## 2021-04-06 ENCOUNTER — Other Ambulatory Visit: Payer: Self-pay

## 2021-04-06 ENCOUNTER — Encounter: Payer: Self-pay | Admitting: Emergency Medicine

## 2021-04-06 DIAGNOSIS — U071 COVID-19: Secondary | ICD-10-CM

## 2021-04-06 DIAGNOSIS — R051 Acute cough: Secondary | ICD-10-CM | POA: Diagnosis not present

## 2021-04-06 DIAGNOSIS — R0602 Shortness of breath: Secondary | ICD-10-CM

## 2021-04-06 DIAGNOSIS — Z86711 Personal history of pulmonary embolism: Secondary | ICD-10-CM

## 2021-04-06 NOTE — ED Triage Notes (Signed)
Pt c/o productive cough, HA, and SOB with exertion, laying down, and excessive talking. She was dx with Covid at the end of August and cough stems from there.

## 2021-04-06 NOTE — Discharge Instructions (Signed)
Go to the emergency department for evaluation of your shortness of breath and cough with your history of pulmonary embolism and recent COVID.

## 2021-04-06 NOTE — ED Provider Notes (Signed)
[facility] [place]    CSN: [id number] Arrival date & time: 04/06/21  1441      History   Chief Complaint Chief Complaint  Patient presents with   Cough    HPI Chelsea Werner is a 65 y.o. female.  Patient presents with cough and shortness of breath x1 month.  Her symptoms have been worse for the past week.  She had COVID 1 month ago and also reports history of pulmonary embolism in 2016 and 2019.  She denies chest pain, focal weakness, fever, chills, or other symptoms.  She has been seen by her PCP several times since having COVID.   Her medical history includes pulmonary embolism, DVT, morbid obesity, lupus, migraine headaches, chronic low back pain, allergic rhinitis, hypertension, history of tuberculosis.  The history is provided by the patient and medical records.   Past Medical History:  Diagnosis Date   Allergic rhinitis    Arthritis    back   Chronic low back pain    Complication of anesthesia    "sleep paralysis" with anesthesia (years ago). No issues recently.   Deep vein thrombosis (DVT) (HCC)    Right leg (x3) last after back surgery (02/2018)   Family history of adverse reaction to anesthesia    Brother - slow to wake.  Uncle - "never woke up" - long ago.   History of blood transfusion    History of chicken pox    Hx of migraines    Hyperlipidemia    Hypertension    Iritis, chronic    Positive TB test 2009   from [place]. Tests positive.  Received treatment 2009   Pulmonary embolism (HCC)    (x2) last 07/2017   Spinal stenosis of lumbar region with radiculopathy    Thyroid disease     Patient Active Problem List   Diagnosis Date Noted   Constipation due to opioid therapy 03/13/2018   Spinal stenosis of lumbar region with radiculopathy 03/13/2018   UTI (urinary tract infection) 03/13/2018   Back pain 03/11/2018   Suppurative parotitis 04/29/2016   Calculus of parotid gland duct 12/11/2015   Acromioclavicular arthrosis 03/07/2015   Fibrocystic  breast disease 01/03/2015   Cervical radiculitis 01/03/2015   Extreme obesity 01/03/2015   Migraine with aura and without status migrainosus, not intractable 01/03/2015   LA (lupus anticoagulant) disorder (HCC) 01/03/2015   Lichen sclerosus of female genitalia 01/03/2015   Benign hypertension 01/03/2015   Vitamin D deficiency 01/03/2015   History of TB (tuberculosis) 01/03/2015   History of pulmonary embolus (PE) 12/16/2014   Thrombocytopenia (HCC) 07/01/2014   Chronic right shoulder pain 04/04/2014   Edema    Sacral back pain 04/10/2012   Chronic low back pain    Iritis, chronic    Chronic allergic rhinitis    Personal history of tuberculosis 10/26/2007    Past Surgical History:  Procedure Laterality Date   ABDOMINAL HYSTERECTOMY  1999-2000   BACK SURGERY  1984   BREAST BIOPSY  1988, 2008   CATARACT EXTRACTION W/PHACO Right 04/25/2019   Procedure: CATARACT EXTRACTION PHACO AND INTRAOCULAR LENS PLACEMENT (IOC) RIGHT  00:51.5  12.5%  6.44;  Surgeon: Lockie Mola, MD;  Location: [facility];  Service: Ophthalmology;  Laterality: Right;   CESAREAN SECTION     LUMBAR LAMINECTOMY     MYOMECTOMY  1988   PLANTAR FASCIECTOMY  2013    OB History     Gravida  1   Para  1   Term  Preterm      AB      Living         SAB      IAB      Ectopic      Multiple      Live Births               Home Medications    Prior to Admission medications   Medication Sig Start Date End Date Taking? Authorizing Provider  acetaminophen (TYLENOL) 325 MG tablet Take 650 mg by mouth every 4 (four) hours as needed for mild pain, moderate pain or fever.    Yes [provider]  Adalimumab 40 MG/0.4ML PNKT Inject 80mg  SQ on Day 1, 40mg  on Day 8, then 40mg  every other week Quantity 4 Maintenance: Inject 40mg  SQ every other week Quantity 2 08/19/20  Yes [provider]  apixaban (ELIQUIS) 5 MG TABS tablet Take 5 mg by mouth 2 (two) times daily.    Yes  [provider]  carboxymethylcellulose (REFRESH PLUS) 0.5 % SOLN Place 1 drop into both eyes daily.   Yes [provider]  cetirizine (ZYRTEC) 10 MG tablet Take 10 mg by mouth at bedtime.    Yes [provider]  furosemide (LASIX) 20 MG tablet Take 40 mg by mouth daily as needed for edema.    Yes [provider]  hydrOXYzine (ATARAX/VISTARIL) 25 MG tablet Take 1 tablet (25 mg total) by mouth 3 (three) times daily as needed. 04/01/16  Yes Sowles, , MD  potassium chloride SA (K-DUR,KLOR-CON) 20 MEQ tablet Take 20 mEq by mouth daily as needed. take only with Lasix   Yes [provider]  pregabalin (LYRICA) 100 MG capsule Take by mouth. 10/08/19 04/08/21 Yes [provider]  tapentadol (NUCYNTA) 50 MG tablet Take by mouth. 05/28/20 04/12/21 Yes [provider]  albuterol (VENTOLIN HFA) 108 (90 Base) MCG/ACT inhaler Inhale into the lungs. 03/14/21   [provider]  baclofen (LIORESAL) 10 MG tablet Take by mouth.    [provider]  gabapentin (NEURONTIN) 100 MG capsule Take 100 mg by mouth 3 (three) times daily.     [provider]  HYDROcodone-acetaminophen (NORCO) 10-325 MG tablet Take 1 tablet by mouth every 6 (six) hours as needed.    [provider]  Lidocaine (ASPERCREME LIDOCAINE) 4 % PTCH Apply 1 patch topically every 12 (twelve) hours. Remove patch after worn for 12 hours    [provider]  prednisoLONE acetate (PRED MILD) 0.12 % ophthalmic suspension Place 1 drop into both eyes daily as needed (irratation).     [provider]    Family History Family History  Problem Relation Age of Onset   Hypertension Father    Early death Mother    Thyroid cancer Sister    Deep vein thrombosis Sister    Parkinson's disease Brother    Prostate cancer Brother     Social History Social History   Tobacco Use   Smoking status: Never   Smokeless tobacco: Never  Vaping Use    Vaping Use: Never used  Substance Use Topics   Alcohol use: Not Currently    Alcohol/week: 0.0 standard drinks    Comment: None over 2 yrs   Drug use: No     Allergies   Ethambutol, Xolair [omalizumab], Metaxalone, Ace inhibitors, Capsaicin, Chocolate, Ciprofloxacin, Nsaids, Peanut oil, Sulfa antibiotics, Clindamycin/lincomycin, Cyclobenzaprine, and Tramadol   Review of Systems Review of Systems  Constitutional:  Negative for chills  and fever.  Respiratory:  Positive for cough and shortness of breath.   Cardiovascular:  Negative for chest pain and palpitations.  Gastrointestinal:  Negative for abdominal pain and vomiting.  Skin:  Negative for color change and rash.  All other systems reviewed and are negative.   Physical Exam Triage Vital Signs ED Triage Vitals  Enc Vitals Group     BP      Pulse      Resp      Temp      Temp src      SpO2      Weight      Height      Head Circumference      Peak Flow      Pain Score      Pain Loc      Pain Edu?      Excl. in GC?    No data found.  Updated Vital Signs BP (!) 161/92 (BP Location: Left Arm)   Pulse 69   Temp 98.3 F (36.8 C) (Oral)   Resp 20   SpO2 97%   Visual Acuity Right Eye Distance:   Left Eye Distance:   Bilateral Distance:    Right Eye Near:   Left Eye Near:    Bilateral Near:     Physical Exam Vitals and nursing note reviewed.  Constitutional:      General: She is not in acute distress.    Appearance: She is well-developed. She is obese.  HENT:     Head: Normocephalic and atraumatic.     Mouth/Throat:     Mouth: Mucous membranes are moist.  Eyes:     Conjunctiva/sclera: Conjunctivae normal.  Cardiovascular:     Rate and Rhythm: Normal rate and regular rhythm.     Heart sounds: Normal heart sounds.  Pulmonary:     Effort: Pulmonary effort is normal. No respiratory distress.     Breath sounds: Normal breath sounds.  Abdominal:     Palpations: Abdomen is soft.     Tenderness: There is  no abdominal tenderness.  Musculoskeletal:     Cervical back: Neck supple.  Skin:    General: Skin is warm and dry.  Neurological:     Mental Status: She is alert and oriented to person, place, and time.  Psychiatric:        Mood and Affect: Mood normal.        Behavior: Behavior normal.     UC Treatments / Results  Labs (all labs ordered are listed, but only abnormal results are displayed) Labs Reviewed - No data to display  EKG   Radiology No results found.  Procedures Procedures (including critical care time)  Medications Ordered in UC Medications - No data to display  Initial Impression / Assessment and Plan / UC Course  I have reviewed the triage vital signs and the nursing notes.  Pertinent labs & imaging results that were available during my care of the patient were reviewed by me and considered in my medical decision making (see chart for details).  Shortness of breath, cough, history of pulmonary embolism, recent COVID.  O2 sat 97% on room air.  Based on the patient's history and report of worsening symptoms for the past week, sending her to the ED for evaluation.  She declines EMS and states her sister will drive her to the ED in [place] which she prefers.   Final Clinical Impressions(s) / UC Diagnoses   Final diagnoses:  Shortness of  breath  Acute cough  History of pulmonary embolism  COVID-19     Discharge Instructions      Go to the emergency department for evaluation of your shortness of breath and cough with your history of pulmonary embolism and recent COVID.     ED Prescriptions   None    PDMP not reviewed this encounter.   Mickie Bail, NP 04/06/21 1534

## 2021-11-27 ENCOUNTER — Ambulatory Visit: Payer: Managed Care, Other (non HMO) | Admitting: Nurse Practitioner

## 2023-09-14 ENCOUNTER — Emergency Department

## 2023-09-14 ENCOUNTER — Emergency Department
Admission: EM | Admit: 2023-09-14 | Discharge: 2023-09-14 | Disposition: A | Discharge status: Home / Self Care | Attending: Emergency Medicine | Admitting: Emergency Medicine

## 2023-09-14 ENCOUNTER — Other Ambulatory Visit: Payer: Self-pay

## 2023-09-14 ENCOUNTER — Encounter: Payer: Self-pay | Admitting: Emergency Medicine

## 2023-09-14 DIAGNOSIS — M79604 Pain in right leg: Secondary | ICD-10-CM | POA: Diagnosis present

## 2023-09-14 MED ORDER — ONDANSETRON 4 MG PO TBDP
4.0000 mg | ORAL_TABLET | Freq: Once | ORAL | Status: AC
  Administered 2023-09-14: 4 mg via ORAL
  Filled 2023-09-14: qty 1

## 2023-09-14 MED ORDER — HYDROMORPHONE HCL 1 MG/ML IJ SOLN
1.0000 mg | Freq: Once | INTRAMUSCULAR | Status: AC
  Administered 2023-09-14: 1 mg via INTRAMUSCULAR
  Filled 2023-09-14: qty 1

## 2023-09-14 MED ORDER — PREDNISONE 20 MG PO TABS
60.0000 mg | ORAL_TABLET | Freq: Once | ORAL | Status: AC
  Administered 2023-09-14: 60 mg via ORAL
  Filled 2023-09-14: qty 3

## 2023-09-14 MED ORDER — PREDNISONE 10 MG PO TABS
ORAL_TABLET | ORAL | 0 refills | Status: DC
  Filled 2023-09-14: qty 21, 6d supply, fill #0

## 2023-09-14 NOTE — ED Triage Notes (Signed)
 Patient to ED via POV for right leg pain. Hx of blood clots. Ongoing x1 week. States difficulty to walk due to pain.

## 2023-09-14 NOTE — ED Notes (Addendum)
 PT expressed hot flashes, nausea, and abdominal pain following dilaudid intervention. PT given zofran for same. Dr. Derrill Kay made aware.

## 2023-09-14 NOTE — ED Notes (Signed)
 Pt states that she feels a little better after the zofran. PT resting and comfortable at this time.

## 2023-09-14 NOTE — ED Provider Notes (Signed)
 [facility] Provider Note    Event Date/Time   First MD Initiated Contact with Patient 09/14/23 1936     (approximate)   History   Leg Pain   HPI  Chelsea Werner is a 68 y.o. female  who presents to the emergency department because of concern for right leg pain. Patient states pain is severe and is making it hard for her to walk. The patient states she has history of chronic pain, follows at pain clinic. Has a home regimen of pain medication which has not been sufficiently helping with the pain. She does have history of blood clots and when this pain started about a week ago it did remind her of her blood clot. She does take anticoagulants and has not missed any doses recently.       Physical Exam   Triage Vital Signs: ED Triage Vitals  Encounter Vitals Group     BP 09/14/23 1740 (!) 164/83     Systolic BP Percentile --      Diastolic BP Percentile --      Pulse Rate 09/14/23 1740 63     Resp 09/14/23 1740 18     Temp 09/14/23 1740 97.6 F (36.4 C)     Temp Source 09/14/23 1740 Oral     SpO2 09/14/23 1740 94 %     Weight 09/14/23 1739 289 lb (131.1 kg)     Height 09/14/23 1739 5\' 5"  (1.651 m)     Head Circumference --      Peak Flow --      Pain Score 09/14/23 1739 9     Pain Loc --      Pain Education --      Exclude from Growth Chart --     Most recent vital signs: Vitals:   09/14/23 1740  BP: (!) 164/83  Pulse: 63  Resp: 18  Temp: 97.6 F (36.4 C)  SpO2: 94%   General: Awake, alert, oriented.  CV:  Good peripheral perfusion. Regular rate and rhythm. Resp:  Normal effort. Lungs clear. Abd:  No distention.  Ext:  Right calf tender to palpation.  ED Results / Procedures / Treatments   Labs (all labs ordered are listed, but only abnormal results are displayed) Labs Reviewed - No data to display   EKG  None   RADIOLOGY RLE US IMPRESSION:  Negative.     PROCEDURES:  Critical Care performed: No   MEDICATIONS  ORDERED IN ED: Medications - No data to display   IMPRESSION / MDM / ASSESSMENT AND PLAN / ED COURSE  I reviewed the triage vital signs and the nursing notes.                              Differential diagnosis includes, but is not limited to, sciatica, DVT, muscle spasm  Patient's presentation is most consistent with acute presentation with potential threat to life or bodily function.  Patient presented to the emergency department today because of concerns for right leg pain.  Patient with history of DVT.  Ultrasound negative for blood clot.  Description of pain is concerning for sciatica.  Patient is already on a significant pain regimen.  Will give steroids and add on steroids to help with acute inflammation.  Did encourage patient follow-up with her pain clinic.     FINAL CLINICAL IMPRESSION(S) / ED DIAGNOSES   Final diagnoses:  Right leg pain  Note:  This document was prepared using Dragon voice recognition software and may include unintentional dictation errors.    Phineas Semen, MD 09/14/23 [phone]

## 2023-09-15 ENCOUNTER — Telehealth: Payer: Self-pay | Admitting: Emergency Medicine

## 2023-09-15 ENCOUNTER — Other Ambulatory Visit (HOSPITAL_COMMUNITY): Payer: Self-pay

## 2023-09-15 MED ORDER — PREDNISONE 10 MG (21) PO TBPK: ORAL_TABLET | ORAL | 0 refills | Status: AC

## 2023-09-15 NOTE — Telephone Encounter (Signed)
 Prescription resent to [facility] on [address]
